# Patient Record
Sex: Female | Born: 1935
Health system: Southern US, Community
[De-identification: ages and names within clinical notes are randomized; demographics above are authoritative.]

## PROBLEM LIST (undated history)

## (undated) DIAGNOSIS — F419 Anxiety disorder, unspecified: Secondary | ICD-10-CM

## (undated) DIAGNOSIS — K219 Gastro-esophageal reflux disease without esophagitis: Secondary | ICD-10-CM

## (undated) DIAGNOSIS — I2129 ST elevation (STEMI) myocardial infarction involving other sites: Secondary | ICD-10-CM

## (undated) DIAGNOSIS — F329 Major depressive disorder, single episode, unspecified: Secondary | ICD-10-CM

## (undated) DIAGNOSIS — M1990 Unspecified osteoarthritis, unspecified site: Secondary | ICD-10-CM

## (undated) DIAGNOSIS — I350 Nonrheumatic aortic (valve) stenosis: Secondary | ICD-10-CM

## (undated) DIAGNOSIS — M8580 Other specified disorders of bone density and structure, unspecified site: Secondary | ICD-10-CM

## (undated) DIAGNOSIS — I2510 Atherosclerotic heart disease of native coronary artery without angina pectoris: Secondary | ICD-10-CM

## (undated) DIAGNOSIS — I10 Essential (primary) hypertension: Secondary | ICD-10-CM

## (undated) DIAGNOSIS — M419 Scoliosis, unspecified: Secondary | ICD-10-CM

## (undated) DIAGNOSIS — E7800 Pure hypercholesterolemia, unspecified: Secondary | ICD-10-CM

## (undated) DIAGNOSIS — F32A Depression, unspecified: Secondary | ICD-10-CM

## (undated) HISTORY — PX: CORONARY ANGIOPLASTY: SHX604

## (undated) HISTORY — DX: Other specified disorders of bone density and structure, unspecified site: M85.80

## (undated) HISTORY — DX: Depression, unspecified: F32.A

## (undated) HISTORY — DX: ST elevation (STEMI) myocardial infarction involving other sites: I21.29

## (undated) HISTORY — DX: Anxiety disorder, unspecified: F41.9

## (undated) HISTORY — PX: ABDOMINAL HYSTERECTOMY: SHX81

## (undated) HISTORY — DX: Gastro-esophageal reflux disease without esophagitis: K21.9

## (undated) HISTORY — DX: Essential (primary) hypertension: I10

## (undated) HISTORY — DX: Atherosclerotic heart disease of native coronary artery without angina pectoris: I25.10

## (undated) HISTORY — PX: APPENDECTOMY: SHX54

## (undated) HISTORY — DX: Major depressive disorder, single episode, unspecified: F32.9

## (undated) HISTORY — DX: Scoliosis, unspecified: M41.9

## (undated) HISTORY — DX: Pure hypercholesterolemia, unspecified: E78.00

## (undated) HISTORY — DX: Nonrheumatic aortic (valve) stenosis: I35.0

## (undated) HISTORY — DX: Unspecified osteoarthritis, unspecified site: M19.90

## (undated) HISTORY — PX: TONSILLECTOMY: SUR1361

---

## 1990-12-25 DIAGNOSIS — I2129 ST elevation (STEMI) myocardial infarction involving other sites: Secondary | ICD-10-CM

## 1990-12-25 HISTORY — DX: ST elevation (STEMI) myocardial infarction involving other sites: I21.29

## 1998-11-26 ENCOUNTER — Other Ambulatory Visit: Admission: RE | Admit: 1998-11-26 | Discharge: 1998-11-26 | Payer: Self-pay | Admitting: Obstetrics and Gynecology

## 1999-03-18 ENCOUNTER — Encounter: Admission: RE | Admit: 1999-03-18 | Discharge: 1999-06-16 | Payer: Self-pay | Admitting: *Deleted

## 1999-12-01 ENCOUNTER — Other Ambulatory Visit: Admission: RE | Admit: 1999-12-01 | Discharge: 1999-12-01 | Payer: Self-pay | Admitting: Obstetrics and Gynecology

## 2000-03-23 ENCOUNTER — Ambulatory Visit (HOSPITAL_COMMUNITY): Admission: RE | Admit: 2000-03-23 | Discharge: 2000-03-23 | Payer: Self-pay | Admitting: *Deleted

## 2001-04-15 ENCOUNTER — Other Ambulatory Visit: Admission: RE | Admit: 2001-04-15 | Discharge: 2001-04-15 | Payer: Self-pay | Admitting: Obstetrics and Gynecology

## 2001-07-17 ENCOUNTER — Encounter: Payer: Self-pay | Admitting: Obstetrics and Gynecology

## 2001-07-23 ENCOUNTER — Inpatient Hospital Stay (HOSPITAL_COMMUNITY): Admission: RE | Admit: 2001-07-23 | Discharge: 2001-07-25 | Payer: Self-pay | Admitting: Obstetrics and Gynecology

## 2001-07-23 ENCOUNTER — Encounter (INDEPENDENT_AMBULATORY_CARE_PROVIDER_SITE_OTHER): Payer: Self-pay

## 2003-10-01 ENCOUNTER — Encounter: Payer: Self-pay | Admitting: Gastroenterology

## 2003-11-06 ENCOUNTER — Encounter: Payer: Self-pay | Admitting: Gastroenterology

## 2004-01-12 ENCOUNTER — Encounter: Payer: Self-pay | Admitting: Gastroenterology

## 2004-12-08 ENCOUNTER — Inpatient Hospital Stay (HOSPITAL_COMMUNITY): Admission: EM | Admit: 2004-12-08 | Discharge: 2004-12-09 | Payer: Self-pay | Admitting: Emergency Medicine

## 2005-03-20 ENCOUNTER — Ambulatory Visit (HOSPITAL_COMMUNITY): Admission: RE | Admit: 2005-03-20 | Discharge: 2005-03-20 | Payer: Self-pay | Admitting: Thoracic Surgery

## 2005-04-13 ENCOUNTER — Encounter (INDEPENDENT_AMBULATORY_CARE_PROVIDER_SITE_OTHER): Payer: Self-pay | Admitting: *Deleted

## 2005-04-13 ENCOUNTER — Inpatient Hospital Stay (HOSPITAL_COMMUNITY): Admission: RE | Admit: 2005-04-13 | Discharge: 2005-04-18 | Payer: Self-pay | Admitting: Thoracic Surgery

## 2005-04-26 ENCOUNTER — Encounter: Admission: RE | Admit: 2005-04-26 | Discharge: 2005-04-26 | Payer: Self-pay | Admitting: Thoracic Surgery

## 2005-05-17 ENCOUNTER — Encounter: Admission: RE | Admit: 2005-05-17 | Discharge: 2005-05-17 | Payer: Self-pay | Admitting: Thoracic Surgery

## 2005-07-25 ENCOUNTER — Encounter: Admission: RE | Admit: 2005-07-25 | Discharge: 2005-07-25 | Payer: Self-pay | Admitting: Thoracic Surgery

## 2006-09-13 ENCOUNTER — Other Ambulatory Visit: Admission: RE | Admit: 2006-09-13 | Discharge: 2006-09-13 | Payer: Self-pay | Admitting: Obstetrics & Gynecology

## 2008-11-11 ENCOUNTER — Ambulatory Visit (HOSPITAL_COMMUNITY): Admission: RE | Admit: 2008-11-11 | Discharge: 2008-11-11 | Payer: Self-pay | Admitting: Internal Medicine

## 2009-06-02 LAB — LIPID PANEL
Cholesterol: 183 mg/dL (ref 0–200)
LDL Cholesterol: 105 mg/dL
Triglycerides: 103 mg/dL (ref 40–160)

## 2009-06-09 ENCOUNTER — Ambulatory Visit: Payer: Self-pay | Admitting: Gastroenterology

## 2009-06-09 DIAGNOSIS — Z860100 Personal history of colon polyps, unspecified: Secondary | ICD-10-CM | POA: Insufficient documentation

## 2009-06-09 DIAGNOSIS — K219 Gastro-esophageal reflux disease without esophagitis: Secondary | ICD-10-CM

## 2009-06-10 ENCOUNTER — Encounter: Payer: Self-pay | Admitting: Gastroenterology

## 2009-07-12 ENCOUNTER — Telehealth: Payer: Self-pay | Admitting: Gastroenterology

## 2009-07-19 ENCOUNTER — Ambulatory Visit: Payer: Self-pay | Admitting: Gastroenterology

## 2009-07-19 ENCOUNTER — Encounter: Payer: Self-pay | Admitting: Gastroenterology

## 2009-07-21 ENCOUNTER — Encounter: Payer: Self-pay | Admitting: Gastroenterology

## 2010-06-07 ENCOUNTER — Ambulatory Visit (HOSPITAL_COMMUNITY): Admission: RE | Admit: 2010-06-07 | Discharge: 2010-06-07 | Payer: Self-pay | Admitting: Internal Medicine

## 2010-09-06 ENCOUNTER — Ambulatory Visit: Payer: Self-pay | Admitting: Cardiology

## 2010-09-07 ENCOUNTER — Ambulatory Visit: Payer: Self-pay | Admitting: Cardiology

## 2010-09-07 ENCOUNTER — Ambulatory Visit (HOSPITAL_COMMUNITY): Admission: RE | Admit: 2010-09-07 | Discharge: 2010-09-07 | Payer: Self-pay | Admitting: Cardiology

## 2011-01-15 ENCOUNTER — Encounter: Payer: Self-pay | Admitting: Thoracic Surgery

## 2011-02-01 ENCOUNTER — Encounter: Payer: Self-pay | Admitting: Cardiology

## 2011-02-01 DIAGNOSIS — I2510 Atherosclerotic heart disease of native coronary artery without angina pectoris: Secondary | ICD-10-CM | POA: Insufficient documentation

## 2011-02-01 DIAGNOSIS — E7800 Pure hypercholesterolemia, unspecified: Secondary | ICD-10-CM | POA: Insufficient documentation

## 2011-02-01 DIAGNOSIS — I2129 ST elevation (STEMI) myocardial infarction involving other sites: Secondary | ICD-10-CM | POA: Insufficient documentation

## 2011-02-01 DIAGNOSIS — I10 Essential (primary) hypertension: Secondary | ICD-10-CM | POA: Insufficient documentation

## 2011-02-01 DIAGNOSIS — F329 Major depressive disorder, single episode, unspecified: Secondary | ICD-10-CM | POA: Insufficient documentation

## 2011-02-01 DIAGNOSIS — M419 Scoliosis, unspecified: Secondary | ICD-10-CM | POA: Insufficient documentation

## 2011-03-22 ENCOUNTER — Ambulatory Visit (INDEPENDENT_AMBULATORY_CARE_PROVIDER_SITE_OTHER): Payer: Medicare Other | Admitting: Cardiology

## 2011-03-22 ENCOUNTER — Encounter: Payer: Self-pay | Admitting: Cardiology

## 2011-03-22 VITALS — BP 142/82 | HR 54 | Ht 63.0 in | Wt 135.0 lb

## 2011-03-22 DIAGNOSIS — I10 Essential (primary) hypertension: Secondary | ICD-10-CM

## 2011-03-22 DIAGNOSIS — I2510 Atherosclerotic heart disease of native coronary artery without angina pectoris: Secondary | ICD-10-CM

## 2011-03-22 DIAGNOSIS — I350 Nonrheumatic aortic (valve) stenosis: Secondary | ICD-10-CM | POA: Insufficient documentation

## 2011-03-22 DIAGNOSIS — I359 Nonrheumatic aortic valve disorder, unspecified: Secondary | ICD-10-CM

## 2011-03-22 DIAGNOSIS — E7800 Pure hypercholesterolemia, unspecified: Secondary | ICD-10-CM

## 2011-03-22 MED ORDER — NIFEDIPINE ER OSMOTIC RELEASE 60 MG PO TB24: 60.0000 mg | ORAL_TABLET | Freq: Every day | ORAL | Status: DC

## 2011-03-22 NOTE — Progress Notes (Signed)
HPI Mrs. Spradlin is seen today for followup. She said she done very well from a cardiac standpoint. She denies any chest pain, shortness of breath, or dizziness. She's had no palpitations. She does have a light cough. She's also experiencing some leg cramps at night. Allergies  Allergen Reactions  . Tetanus Toxoids     Current Outpatient Prescriptions on File Prior to Visit  Medication Sig Dispense Refill  . CALCIUM PO Take by mouth daily.        . Cholecalciferol (VITAMIN D PO) Take by mouth daily.        . clopidogrel (PLAVIX) 75 MG tablet Take 75 mg by mouth daily.        . Cyanocobalamin (VITAMIN B-12 PO) Take by mouth every other day.       . ezetimibe-simvastatin (VYTORIN) 10-20 MG per tablet Take 1 tablet by mouth at bedtime.        . fish oil-omega-3 fatty acids 1000 MG capsule Take 1 g by mouth every other day.       [name] Kitchen FOLIC ACID PO Take by mouth every other day.       . levocetirizine (XYZAL) 5 MG tablet Take 5 mg by mouth as needed.       . metoprolol (LOPRESSOR) 50 MG tablet Take 1 tablet (50 mg total) by mouth daily.  60 tablet    . NIFEdipine (PROCARDIA XL/ADALAT-CC) 60 MG 24 hr tablet Take 1 tablet (60 mg total) by mouth daily.  90 tablet  3  . Nutritional Supplements (MELATONIN PO) Take by mouth daily as needed.        [name] Kitchen POTASSIUM CHLORIDE PO Take by mouth every other day.        . ranitidine (ZANTAC) 300 MG capsule Take 300 mg by mouth daily.        [name] Kitchen VITAMIN E PO Take by mouth daily.        . B Complex Vitamins (VITAMIN-B COMPLEX PO) Take by mouth every other day.       . Multiple Vitamin (MULTIVITAMIN) tablet Take 1 tablet by mouth daily.        [name] Kitchen PARoxetine (PAXIL) 10 MG tablet Take 10 mg by mouth daily.        [name] Kitchen DISCONTD: POTASSIUM PO Take by mouth daily. OTC         Past Medical History  Diagnosis Date  . CAD (coronary artery disease)   . HTN (hypertension)   . Hypercholesteremia   . GERD (gastroesophageal reflux disease)   . Anxiety and depression   . Aortic  stenosis, mild   . Lateral myocardial infarction 1992  . Scoliosis   . Osteoarthritis   . Diverticula of colon   . Osteopenia     Past Surgical History  Procedure Date  . Coronary angioplasty   . Appendectomy   . Tonsillectomy   . Abdominal hysterectomy     Family History  Problem Relation Age of Onset  . Hypertension Father   . Heart failure Mother   . Hypertension Mother   . Cancer Brother     History   Social History  . Marital Status: Divorced    Spouse Name: N/A    Number of Children: 1  . Years of Education: N/A   Occupational History  . retired    Social History Main Topics  . Smoking status: Former Smoker    Types: Cigarettes    Quit date: 12/26/1991  . Smokeless tobacco: Not on file  .  Alcohol Use: No  . Drug Use: No  . Sexually Active: Not on file   Other Topics Concern  . Not on file   Social History Narrative  . No narrative on file    ROS She does complain of "weird" muscle and joint pain. She particularly has pain in her right hip which significantly limits her activity. She's had no edema or orthopnea. All other systems are reviewed and are negative.  PHYSICAL EXAM BP 142/82  Pulse 54  Ht 5\' 3"  (1.6 m)  Wt 135 lb (61.236 kg)  BMI 23.91 kg/m2 She is an elderly female in no acute distress. She is normocephalic atraumatic. Pupils are equal round and reactive to light and accommodation. Sclera are clear. Oropharynx is clear. Neck is supple no JVD, adenopathy, thyromegaly, or bruits. Lungs are clear. Cardiac exam reveals a harsh grade 2/6 systolic ejection murmur right upper border. PMI is normal. There is no S3. Abdomen is soft and nontender he has no masses or bruits. Extremities are without edema. Pedal pulses are 2+. ASSESSMENT AND PLAN

## 2011-03-22 NOTE — Assessment & Plan Note (Signed)
No current angina. Continue Rx with ASA, Plavix, nifedipine, and metoprolol. Continue risk factor modification.

## 2011-03-22 NOTE — Patient Instructions (Signed)
Continue current medical therapy.  Continue low fat diet and restrict salt intake.  We'll schedule a follow up visit in 6 months.

## 2011-03-22 NOTE — Assessment & Plan Note (Signed)
Continue current antihypertensive therapy and sodium restriction.

## 2011-03-22 NOTE — Assessment & Plan Note (Signed)
Echocardiogram in September demonstrated mild stenosis with an aortic valve area 1.56 cm and a mean gradient of 10 mm of mercury. I think this is unlikely ever to cause her any problems. We did discuss this diagnosis with her today.

## 2011-03-22 NOTE — Assessment & Plan Note (Signed)
Continue Vytorin. Followup blood work per Dr. Clelia Croft.

## 2011-03-23 ENCOUNTER — Other Ambulatory Visit: Payer: Self-pay | Admitting: *Deleted

## 2011-03-23 DIAGNOSIS — I10 Essential (primary) hypertension: Secondary | ICD-10-CM

## 2011-03-23 MED ORDER — NIFEDIPINE ER OSMOTIC RELEASE 60 MG PO TB24: 60.0000 mg | ORAL_TABLET | Freq: Every day | ORAL | Status: DC

## 2011-03-23 NOTE — Telephone Encounter (Signed)
Refill to [facility]

## 2011-04-14 ENCOUNTER — Encounter: Payer: Self-pay | Admitting: Cardiology

## 2011-05-03 ENCOUNTER — Other Ambulatory Visit: Payer: Self-pay | Admitting: Cardiology

## 2011-05-03 NOTE — Telephone Encounter (Signed)
Called requesting samples of Plavix. Left her a message that we do not have any samples. Will be going generic soon so not getting many samples.

## 2011-05-03 NOTE — Telephone Encounter (Signed)
Pt called she said that she needs samples of plavix She said she will be in [facility] tomorrow and will pick up then

## 2011-05-12 NOTE — H&P (Signed)
[name], [name]:  [id number]   MEDICAL RECORD NO.:  [id number]          [name] TYPE:  EMS   LOCATION:  MAJO                         FACILITY:  [facility]   PHYSICIAN:  Ulyses Amor, MD DATE OF BIRTH:  [birth year]   DATE OF ADMISSION:  12/08/2004  DATE OF DISCHARGE:                                HISTORY & PHYSICAL   HISTORY OF PRESENT ILLNESS:  [name] is a 77 year old white woman who is  admitted to [facility] for further evaluation of chest pain.   The [name] has a history of coronary artery disease, which dates back to  75.  At that time, she suffered a myocardial infarction, and subsequently  underwent PTCA.  The records from these events are not yet available.  Her  course has been uncomplicated since then.  She presented to the emergency  department during the night after experiencing chest pain which began at  approximately 2 p.m. yesterday afternoon.  She was at rest at the time.  The  discomfort was described as a pressure in the lower substernal and  epigastric region.  It radiated through to her back.  It was associated with  dyspnea and nausea, but no diaphoresis.  In addition, she experienced  several episodes of vomiting.  The discomfort lasted throughout the  afternoon, evening, and into the night, and ultimately prompted her visit to  the emergency department.  Her discomfort has continued, though has subsided  somewhat since her presentation to the emergency department.  There were no  exacerbating or ameliorating factors.  The pain appears to be unrelated to  position, activity, meals, or respirations.  She believes it is different in  quality from the pain which heralded her acute myocardial infarction.   There is no history of congestive heart failure or arrhythmia.   RISK FACTORS:  The [name] has a number of risk factors for coronary artery  disease including hypertension and dyslipidemia.  There is no history  of  smoking, diabetes mellitus, or family history of early coronary artery  disease.   The [name]'s only other medical problem is that of scoliosis.   SOCIAL HISTORY:  The [name] lives alone.  She does not work.  She neither  smokes nor drinks.   ALLERGIES:  None.   MEDICATIONS:  1.  Procardia.  2.  Pepcid.  3.  Aspirin.  4.  Fosamax.  5.  Vytorin.   PREVIOUS OPERATIONS:  1.  Appendectomy.  2.  Hysterectomy.   REVIEW OF SYSTEMS:  No new problems related to her head, eyes, ears, nose,  mouth, throat, lungs, gastrointestinal system, genitalia, neurologic system,  or extremities.  There is no history of neurologic or psychiatric disorder.  There is no history of fever, chills, or weight loss.   PHYSICAL EXAMINATION:  VITAL SIGNS:  Blood pressure 129/64, pulse 84 and  regular, respirations 20, temperature 96.9.  GENERAL:  The [name] was an elderly white female in no discomfort.  She was  alert, oriented, and appropriate.  HEENT:  Head, eyes, nose, and mouth were normal.  NECK:  Without thyromegaly or adenopathy.  Carotid pulses were palpable  bilaterally and without bruits.  CARDIAC:  Normal S1 and S2.  There was no S3, S4, murmur, rub, or click.  Cardiac rhythm was regular.  CHEST:  Palpation of the lower substernal and upper epigastric region  elicited tenderness.  LUNGS:  Clear.  ABDOMEN:  Without mass, hepatosplenomegaly, distention, rebound, guarding,  or rigidity.  Bowel sounds were normal.  BREAST/PELVIC/RECTAL:  Not performed.  EXTREMITIES:  Without edema, deviation, or deformity.  Radial and dorsalis  pedis pulses were palpable bilaterally.  Brief screening neurologic survey  was unremarkable.   The electrocardiogram was normal.  The chest radiograph had not yet been  performed.  Potassium was 3.6, BUN 7, and creatinine 0.7.  Myoglobin was  80.1.  CK-MB less than 1.0, and troponin less than 0.05.  The remaining  studies were pending at the time of this  dictation.   IMPRESSION:  1.  Chest pain.  Rule out unstable angina, rule out aortic aneurysm.  2.  Coronary artery disease.  Status post myocardial infarction and      subsequent percutaneous transluminal coronary angioplasty in 1993.  3.  Hypertension.  4.  Dyslipidemia.  5.  Scoliosis.   PLAN:  1.  Telemetry.  2.  Serial cardiac enzymes.  3.  Aspirin.  4.  Intravenous heparin.  5.  Intravenous nitroglycerin.  6.  Metoprolol.  7.  Non-contrast chest and abdominal CT scans.  8.  Obtain prior hospital records.  9.  Further measures per Dr. Swaziland.   The [name]'s sister was present throughout the [name] encounter.      Mitc   MSC/MEDQ  D:  12/08/2004  T:  12/08/2004  Job:  [id number]   cc:   Peter M. Swaziland, M.D.  [address]  [place], [place] [address]  Fax: [phone]

## 2011-05-12 NOTE — H&P (Signed)
[name], [name]                 ACCOUNT NO.:  [id number]   MEDICAL RECORD NO.:  [id number]          PATIENT TYPE:  INP   LOCATION:  NA                           FACILITY:  [facility]   PHYSICIAN:  Ines Bloomer, M.D. DATE OF BIRTH:  26-Apr-1929   DATE OF ADMISSION:  04/13/2005  DATE OF DISCHARGE:                                HISTORY & PHYSICAL   HISTORY OF PRESENT ILLNESS:  This 77 year old patient was in the hospital in  the month of December with a myocardial infarction and underwent stent  placement.  She was placed on Plavix.  She had a previous angioplasty done  in 1993.  Dr. Peter Swaziland was her cardiologist.  At that time, she had a CT  scan done which showed a 2.3 x 3.7 mass in the mediastinum that was thought  to be a possible substernal thyroid, but thyroid uptake scan was negative.  The mass was followed with a followup CT scan and did not change but had an  increase in size.  PET scan was done which showed minimal uptake in the 3.7  cm anterior mediastinal mass and was thought to be a benign thymoma.  Because of the persistence of the mass, she is scheduled for either cervical  or transdermal thyroidectomy.   ALLERGIES:  TETANUS SHOT.   MEDICATIONS:  1.  Plavix 75 mg a day.  2.  Panadine 20 mg twice a day.  3.  Enteric-coated aspirin 81 mg a day.  4.  Vitorin 10/20 one a day.  5.  Nifedical  1 twice a day.  6.  Fosamax 70 mg every 2 weeks.  7.  Vitorin 300 mcg a week.  8.  Quinine for leg cramps.   PAST MEDICAL HISTORY:  Also significant for:  1.  Hypertension.  2.  Hypercholesterolemia.  3.  Scoliosis.  4.  Arthritis.   PAST SURGICAL HISTORY:  1.  Hysterectomy.  2.  Appendectomy.  3.  Compression fractures of perivertebral area in 1991.  She also has been      treated for osteoporosis and osteopenia.   FAMILY HISTORY:  Positive for cardiac disease.   SOCIAL HISTORY:  She is divorced and has one child.  She does not smoke,  quit smoking in 1993. Does  not drink alcohol on a regular basis.   REVIEW OF SYSTEMS:  She is 228 pounds, 5 feet 4 inches.  CARDIAC:  As in  History of Present Illness.  She has no ongoing angina.  She did have a  cardiac clearance by Dr. Swaziland.  PULMONARY:  She has some shortness of  breath with exertion, no hemoptysis.  GI: She has a mild hernia.  No nausea,  vomiting, constipation, diarrhea.  GU:  She has no dysuria or frequent  urinations.  VASCULAR:  She had some cramping in the legs, and she has  numbness in her left foot from previous traumatic injury.  ORTHOPEDIC:  She  has hip, ankle, and knee pain as well as arthritis in her hands.  NEUROLOGIC:  No history of frequent headaches or seizures.  PSYCHIATRIC:  No  history of depression.  EYES AND ENT:  Normal.   PHYSICAL EXAMINATION:  GENERAL:  Frail, Caucasian female in no acute  distress.  VITAL SIGNS:  Blood pressure 150/80, pulse 82, respirations 18, O2  saturation 97%.  HEENT:  Head is atraumatic.  Pupils equal, round, and reactive to light and  accommodation. Ears:  Tympanic membranes are intact.  Nares:  No septal  deviation.  Throat without lesions.  NECK:  Supple.  There is no thyromegaly.  CHEST:  Clear to auscultation and percussion.  HEART:  Regular sinus rhythm.  ABDOMEN:  Soft.  There is no hepatosplenomegaly.  EXTREMITIES:  Pulses 2+.  There is no clubbing or edema.  NEUROLOGIC:  She is oriented x 3.  Sensory and motor are intact.  Deep  tendon reflexes intact.  SKIN:  Without lesion.   IMPRESSION:  1.  Mediastinal mass, probable thymoma versus lymphoma.  2.  Hypertension.  3.  Coronary artery disease.  4.  Hypercholesterolemia.  5.  Scoliosis.  6.  Osteoporosis.   PLAN:  Resection of thymoma.      DPB/MEDQ  D:  04/11/2005  T:  04/11/2005  Job:  [id number]

## 2011-05-12 NOTE — Discharge Summary (Signed)
[facility]  Patient:    [name], [name]                  MRN: [id number] Adm. Date:  [id number] Disc. Date: [id number] Attending:  Jenean Lindau CC:         Thomas C. Wall, M.D. [facility]   Discharge Summary  PRINCIPAL DISCHARGE DIAGNOSIS:  Symptomatic pelvic relaxation with uterine descensus, cystocele, and rectocele.  PROCEDURE:  Transvaginal hysterectomy with left salpingo-oophorectomy, anterior and posterior colporrhaphy.  TRANSFUSIONS:  None.  COMPLICATIONS:  None.  HOSPITAL CONSULTATIONS:  None.  HISTORY OF PRESENT ILLNESS:  [name] is a 77 year old menopausal female with symptomatic pelvic relaxation. She was counseled as to the alternatives of management including no management versus pessary placement versus definitive surgical management and chose the latter. She was cleared for surgery from the cardiology standpoint by her cardiologist, Dr. Daleen Squibb. Please see dictated history and physical for full details of the history of present illness, past medical history, family history, social history, examination and laboratory studies on admission.  HOSPITAL COURSE:  The patient was admitted for same day surgery and underwent the above-noted procedure under general endotracheal anesthesia without complications. Estimated blood loss was 200 cc. As she had a high cystocele repair without much work around the urethrovesical junction, a suprapubic catheter was not placed. She did have a transurethral Foley that was placed and removed along with the vaginal packing on the morning of postoperative day #1.  Postoperatively, she did well. She remained afebrile and had excellent urinary output. Her postoperative hemoglobin on day #1 was 11.5, down from her preoperative level of 13.0. Sodium was slightly low at 132 and potassium of 3.2.  On postoperative day #1 she advanced her diet to full liquids and began voiding without difficulty. She had  good pain relief with oral medications. She began ambulating. She had replacement of potassium and a recheck on postoperative day #2 revealed a potassium of 4.2 with a sodium of 139. On postoperative day #2 she was tolerating a general diet, ambulating independently, and voiding without difficulty. She had a minimal amount of vaginal drainage. Pathology was reviewed and discussed with the patient which revealed uterine serosal endometriosis with adenomyosis, leiomyoma uteri, no pathologic diagnosis of the left tube and ovary, and benign proliferative endometrium.  The patient was discharged home on postoperative day #2 in improved condition. She was given routine verbal and written discharge instructions.  DISCHARGE FOLLOWUP:  The patient was told to follow up in the office in two to three weeks time. She is to call the office prior to scheduled followup for excessive pain, fever, bleeding, bladder or bowel problems, symptoms of a DVT or PE, or any other concerns. She is to contact Dr. Daleen Squibb for any cardiac related symptoms.  DISCHARGE MEDICATIONS:  She is to continue all her routine medications. She is to take Advil or Aleve as needed for discomfort and was given a prescription for Percocet, dispense 20, one to two q.4-6h. p.r.n. pain with no refill. DD:  08/05/01 TD:  08/05/01 Job: [id number]

## 2011-05-12 NOTE — H&P (Signed)
[facility]  Patient:    [name], [name]                  MRN: [id number] Adm. Date:  [id number] Attending:  Jenean Lindau                         History and Physical  IDENTIFYING DATA:  [name] is a 77 year old female with pelvic relaxation, admitted for surgical repair.  HISTORY OF PRESENT ILLNESS:  [name] is a 77 year old para 1, divorced, menopausal female who has been followed by this physician since 1996.  She has had progressively worsening pelvic relaxation with uterine descensus, cystocele, and rectocele, which have become increasingly symptomatic. Specifically, she reports difficulty with constipation, and complains of a constant perineal bulge which is worse when she is standing or has an active day.  She was offered a pessary versus surgical management and elected to proceed to definitive surgical management.  She has been extensively counseled as to the risks, benefits, alternatives, and complications including recovery expectations and limitations, and agrees to proceed.  She has seen the informed consent film, has undergone a mechanical bowel prep at home, and presents now for admission for same-day surgery.  PAST SURGICAL HISTORY: 1. Tonsillectomy in the distant past. 2. Appendectomy in the distant past. 3. In 1992, angioplasty.  PAST MEDICAL HISTORY: 1. History of crushed vertebra in her back with known severe scoliosis. 2. Cardiovascular disease, status post myocardial infarction in the distant    past, currently with stable cardiac function with a normal nonischemic    Cardiolite study in May 2001. 3. Hypertension, well controlled. 4. Osteopenia.  OBSTETRICAL HISTORY:  Vaginal delivery x 1.  GYNECOLOGICAL HISTORY:  As noted above.  Normal Pap smear in April 2002. Normal mammogram in February 2002.  No postmenopausal bleeding.  She was on hormonal replacement therapy for many years, stopping this in November  1998. She took Evista from December 1998 through November 2001, and is currently on no form of hormonal therapy.  ALLERGIES:  No known drug or latex sensitivities.  She has an allergic reaction to TETANUS.  CURRENT MEDICATIONS: 1. Accupril 20 mg q.d. 2. Famotidine 20 mg 2 q.d. with other medications. 3. Zocor 10 mg 3 p.o. at h.s. postprandially. 4. Norvasc 10 mg 1/2 in the morning, 1/2 in the evening. 5. Aspirin and/or Celebrex and/or Vioxx, all discontinued prior to surgery. 6. Calcium supplementation 1200 mg daily. 7. Fosamax 70 mg once weekly. 8. Stool softener 2 q.d. 9. Premarin vaginal cream 2 g three times weekly.  TRANSFUSION HISTORY:  Negative.  SOCIAL HISTORY:  The patient was divorced after 45 years of marriage and moved from [place] in [place].  She has a sister who lives locally.  She has one grown son.  She denies smoking.  She drinks wine occasionally.  She denies any illicit drug use.  FAMILY HISTORY:  Positive for breast cancer in paternal grandmother; otherwise noncontributory.  REVIEW OF SYSTEMS:  Notable for the history of present illness.  She denies any stress urinary incontinence or symptoms of an overactive bladder.  She has no neurologic or respiratory symptoms.  Her cardiac status is stable, and she has been cleared for surgery by Dr. Daleen Squibb and associates.  She currently has no chest pain, shortness of breath, or exertional symptoms.  PHYSICAL EXAMINATION:  GENERAL:  Elderly white female in no apparent distress.  VITAL SIGNS:  Blood pressure 136/80.  HEENT:  Negative.  Oropharynx clear.  NECK:  Supple.  Without thyromegaly or lymphadenopathy.  LUNGS:  Clear to auscultation.  HEART:  Regular rate and rhythm.  Without murmurs, gallops, or rubs.  Carotids +2 and equal, without bruit.  Distal pulses full.  BREASTS:  Without masses.  ABDOMEN:  Soft and nontender.  Without hepatosplenomegaly, mass, or tenderness.  Well-healed appendectomy  scar.  PELVIC:  Notable for well-estrogenized tissue.  Fair perineal support, with a second- to third-degree cystocele, second-degree rectocele, cervical uterine descensus to just inside the introitus with traction, a normal-sized uterus, no adnexal masses.  RECTOVAGINAL:  Confirmatory.  NEUROLOGIC:  Grossly nonfocal.  EXTREMITIES:  Without edema, clubbing, or cyanosis.  LABORATORY DATA:  On admission revealed hemoglobin of 13.0, hematocrit of 38.9, white count of 4.0, platelets of 254.  CMET profile within normal limits.  Coagulation studies normal.  Urinalysis clear.  Chest x-ray per Dr. Daleen Squibb.  EKG per Dr. Daleen Squibb.  ADMISSION IMPRESSION: 1. Pelvic relaxation, symptomatic, desires surgical repair. 2. Osteopenia. 3. Coronary artery disease, status post myocardial infarction and angioplasty    in 1992, currently stable. 4. Hypertension.  PLAN:  The patient is to undergo vaginal hysterectomy with repair, as noted above.  Ovaries will be removed if technically possible at the time of surgery.  She has been cleared for surgery by Dr. Daleen Squibb, and his note is present on the patients chart with details regarding her cardiac function. He will be notified of the patients admission.  She has undergone a mechanical bowel prep and will be adequately rehydrated intraoperatively and postoperatively.  She has been extensively counseled and has given full consent. DD:  07/23/01 TD:  07/23/01 Job: [id number]

## 2011-05-12 NOTE — Op Note (Signed)
NAMEMAGENTA, SCHMIESING                 ACCOUNT NO.:  [id number]   MEDICAL RECORD NO.:  [id number]          PATIENT TYPE:  INP   LOCATION:  2899                         FACILITY:  [facility]   PHYSICIAN:  Ines Bloomer, M.D. DATE OF BIRTH:  09/06/1929   DATE OF PROCEDURE:  DATE OF DISCHARGE:                                 OPERATIVE REPORT   PREOPERATIVE DIAGNOSIS:  Thymic mass.   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   OPERATION PERFORMED:  Partial median sternotomy, thymectomy.   SURGEON:  Ines Bloomer, M.D.   FIRST ASSISTANT:  Rowe Clack, P.A.-C.   PROCEDURE:  After general anesthesia, the patient was prepped and draped in  the usual sterile manner.  An incision was made from the sternal notch  dissecting down.  The clavicular sternal ligament was divided with  electrocautery, and the electrocautery was used to go down to the sternum,  and then the sternum was divided down to the manubrium with reciprocating  saw.  Topical thrombin was applied to the marrow. All bleeding was  electrocoagulated.  A laminar spreader was placed in the wound, and then  dissection was started superiorly dissecting the superior portion of the  thymus off the insertion of the thyroid gland and then dissecting out the  right and the left horns.  The mass was cystic in structure.  It was about 5-  8 cm in size, and we first dissected down the left side dissecting the mass  up off the sternum and then off the innominate vein and doubly ligating  several branches of the innominate vein and then dissecting down to the  pericardium and dissecting it off the pericardium.  After the mass had been  completely dissected free on the left side, it was then dissected free on  the right side, first dissecting out the upper horn of the thymus and then  dissecting it off the pericardium.  The 5-cm plus mass was removed.  The  entire thymus was removed.  All bleeding was electrocoagulated.  A  Blake  drain was placed in the space and brought out through a separate stab wound  in the neck.  The chest was closed with three #6 wires, #2-0 Vicryl in the  muscle layer, and 3-0 Vicryl in a subcuticular stitch, and Dermabond for the  skin.  The patient was returned to the recovery room in stable condition.      DPB/MEDQ  D:  04/13/2005  T:  04/13/2005  Job:  [id number]

## 2011-05-12 NOTE — Cardiovascular Report (Signed)
[name], [name]                 ACCOUNT NO.:  [id number]   MEDICAL RECORD NO.:  [id number]          PATIENT TYPE:  INP   LOCATION:  6525                         FACILITY:  [facility]   PHYSICIAN:  Peter M. Swaziland, M.D.  DATE OF BIRTH:  1929-04-12   DATE OF PROCEDURE:  12/08/2004  DATE OF DISCHARGE:                              CARDIAC CATHETERIZATION   INDICATION FOR PROCEDURE:  A 76 year old white female presents with a non Q-  wave myocardial infarction.  Patient has a history of coronary disease with  remote angioplasty of a diagonal branch in 1993.   PROCEDURE:  1.  Left heart catheterization.  2.  Coronary and left ventricular angiography.  3.  Intracoronary stenting of the mid right coronary artery.   ACCESS:  Via the right femoral artery using standard Seldinger technique.   EQUIPMENT:  6-French 4 cm right and left Judkins catheter, 6-French pigtail  catheter, 6-French arterial sheath, 6-French FR4 guide, 0.014 high-torque  floppy extra support wire, a 2.5 x 15 mm Maverick balloon, and a 3.0 x 18 mm  Cypher drug-eluting stent.   CONTRAST:  160 mL of Omnipaque.   MEDICATIONS:  Nitroglycerin drip at 13 mcg/minute, heparin 3000 units IV,  Integrilin double bolus 180 mcg/kg followed by continuous infusion at 2  mcg/kg/minute, nitroglycerin 200 mcg intracoronary x2, Plavix 300 mg p.o.,  Pepcid 20 mg p.o.   HEMODYNAMIC DATA:  1.  Aortic pressure was 124/61 with a mean of 87 mmHg.  2.  Left ventricular pressure was 120 with an EDP of 15 mmHg.   ANGIOGRAPHIC DATA:  1.  The left coronary artery arises and distributes normally.  The left main      coronary artery is normal.  2.  Left anterior descending is heavily calcified proximally.  At the      takeoff of the first septal perforator there is a moderate stenosis      estimated at 50-70%.  This lesion appeared smooth.  Remainder of the LAD      was without significant disease, but was very tortuous.  There is a      large  diagonal branch that appeared widely patent.  3.  The left circumflex coronary artery gives rise to two marginal branches.      The first marginal branch has 20-30% narrowing at the ostium, otherwise      no significant disease.  4.  The right coronary artery rises and distributes normally in a dominant      fashion.  It is heavily calcified throughout the proximal and mid      vessel.  In the mid vessel there is an eccentric stenosis of 80-90%.      There is increased haziness in this lesion with hypodensity suggesting      ulcerated plaque.  The remainder of the vessel has scattered wall      irregularities less than 20%.   LEFT VENTRICULOGRAPHY:  Left ventricular angiography performed in the RAO  view.  This demonstrates normal left ventricular size and contractility with  normal systolic function.  Ejection fraction is estimated  at 65%.  There is  no mitral regurgitation.   We proceeded at this point with intervention of the mid right coronary  lesion which appeared to be the culprit lesion.  We initially obtained guide  shots and patient was given intracoronary nitroglycerin.  We then placed a  high-torque floppy wire across the lesion with some difficulty.  In attempt  to primarily stent the lesion we were unable to advance a stent across the  lesion.  We then withdrew the stent and pre dilated the lesion with a 2.5 x  15 mm Maverick balloon dilating x3 up to 10 atmospheres.  Again, when we  tried to recross with the stent we were again unable to advance the stent  despite having excellent catheter support.  We then switched to an extra  support wire.  We left the original floppy wire down as well and used this  as a buddy wire.  With deep engagement of the right coronary artery with our  guide and using the buddy wire system we were able to eventually cross the  lesion with the stent.  It was deployed at 11 atmospheres and then post  dilated to 16 atmospheres with the stent  balloon.  This yielded an excellent  angiographic result with 0% residual stenosis and TIMI grade 3 flow.  During  balloon inflations the patient did have fairly dramatic ST elevation  inferiorly, but had minimal cardiac symptoms.   FINAL INTERPRETATION:  1.  Two vessel atherosclerotic coronary artery disease.  There was a modest      lesion in the left anterior descending at the takeoff of the first      septal perforator.  There was a more severe ulcerated lesion in the mid      right coronary artery.  2.  Normal left ventricular function.  3.  Successful stenting of the mid right coronary artery.       PMJ/MEDQ  D:  12/08/2004  T:  12/08/2004  Job:  [id number]   cc:   Laqueta Linden, M.D.  [address]  [place]  [place] [address]  Fax: [phone]

## 2011-05-12 NOTE — Discharge Summary (Signed)
NAMESUMEYA, YONTZ                 ACCOUNT NO.:  [id number]   MEDICAL RECORD NO.:  [id number]          PATIENT TYPE:  INP   LOCATION:  2030                         FACILITY:  [facility]   PHYSICIAN:  Ines Bloomer, M.D. DATE OF BIRTH:  October 29, 1929   DATE OF ADMISSION:  04/13/2005  DATE OF DISCHARGE:  04/18/2005                                 DISCHARGE SUMMARY   HISTORY OF PRESENT ILLNESS:  The patient is a 77 year old female who was in  the hospital in the month of December 2005 with a myocardial infarction and  at that time underwent stent placement.  She was placed on Plavix as well.  She had a previous angioplasty done in 1993.  During this hospitalization, a  CT scan of her chest was also done, and this revealed a 2.3 x 3.7 cm mass in  the mediastinum that was felt most likely to be a substernal thyroid, but  thyroid uptake scan was negative.  The patient had a follow-up CT scan which  showed some increase in size.  A PET scan was done, and this showed minimal  uptake in the 3.7 cm anterior mediastinal mass that was likely to be a  benign thymoma.  The patient was admitted this hospitalization for surgical  resection.   PAST MEDICAL HISTORY:  1.  Hypertension.  2.  Hypercholesterolemia.  3.  Scoliosis.  4.  Arthritis.   PAST SURGICAL HISTORY:  1.  Hysterectomy.  2.  Appendectomy.  3.  Compression fracture, and she has also been treated for osteoporosis and      osteopenia.   ALLERGIES:  TETANUS SHOT.   CURRENT MEDICATIONS:  1.  Plavix 75 mg daily.  2.  Panadeine 20 mg b.i.d.  3.  Enteric coated aspirin 81 mg daily.  4.  Vytorin 10/20 one daily.  5.  Nifediac one b.i.d.  6.  Fosamax 70 mg every two weeks.  7.  Quinine p.r.n. for leg cramps.   FAMILY HISTORY/SOCIAL HISTORY/REVIEW OF SYMPTOMS/PHYSICAL EXAMINATION:  Please see the history and physical done at the time of admission.   HOSPITAL COURSE:  The patient was admitted electively on April 13, 2005 and  taken to  the operating room, at which time she underwent a mini-median  sternotomy with resection of a bronchogenic cyst.  The patient tolerated the  procedure well and was taken to the postanesthesia care unit in stable  condition.  Postoperative hospital course:  The patient has done quite well.  All routine lines, monitors, and drainage devices were discontinued in the  standard fashion.  She has remained hemodynamically stable.  Incisions are  healing well, without signs of infection.  She is tolerating diet and  activity commensurate for level of postoperative convalescence.  The  pathology has not been finalized, but the initial or frozen section was as  stated previously, consistent with a bronchogenic cyst.  Currently, the  patient is felt to be tentatively stable for discharge on the morning of  April 18, 2005 pending morning round reevaluation.   MEDICATIONS ON DISCHARGE:  1.  As preoperatively.  2.  Additionally, for pain Tylox one or two q.6 h. as needed.   INSTRUCTIONS:  The patient received written instructions in regard to  medications, activity, diet, wound care, and followup.   FOLLOWUP:  Dr. Edwyna Shell, Wednesday Apr 26, 2005 at 2:50 p.m., with a chest x-  ray from [facility].   FINAL DIAGNOSIS:  Bronchogenic cyst, now status post surgical resection.   OTHER DIAGNOSES:  As previously listed in the history.      WEG/MEDQ  D:  04/17/2005  T:  04/18/2005  Job:  [id number]   cc:   Ines Bloomer, M.D.  [address]  [place]  [place] [address]   CVTS OFfice   Peter M. Swaziland, M.D.  [address]  [place], [place] [address]  Fax: [phone]

## 2011-05-12 NOTE — Op Note (Signed)
[facility]  Patient:    [name], [name]                  MRN: [id number] Proc. Date: 07/23/01 Adm. Date:  [id number] Attending:  Jenean Lindau CC:         Thomas C. Wall, M.D. [facility]   Operative Report  PREOPERATIVE DIAGNOSES:  Pelvic relaxation with uterine decensus, cystocele, and rectocele.  POSTOPERATIVE DIAGNOSES:  Pelvic relaxation with uterine decensus, cystocele, and rectocele.  PROCEDURE:  Transvaginal hysterectomy with left salpingo-oophorectomy, anterior and posterior colporrhaphy.  SURGEON:  Laqueta Linden, M.D.  ASSISTANT:  Andres Ege, M.D.  ANESTHESIA:  General endotracheal.  ESTIMATED BLOOD LOSS:  200 cc.  URINE OUTPUT:  350 cc.  FLUIDS:  1400 cc of crystalloid.  COUNTS:  Correct x 2.  COMPLICATIONS:  None.  INDICATIONS:  [name] is a 77 year old, para 1, menopausal female, who has a history of progressively worsening pelvic relaxation symptomatic for perineal bulging and difficulty with fecal evacuation.  She denies stress urinary incontinence.  She has done Kegel exercises and used vaginal estrogen cream.  She was offered a pessary versus definitive surgical management and chose the latter.  She has been extensively counseled as to the risks, benefits, alternatives, and complications of the procedure including but not limited to anesthesia risks, infection, bleeding, injury to bowel, bladder, ureters, vessels, nerves.  Possibility of fistula formation, the possibility of recurrent prolapse, the possibility of a laparotomy, recovery expectations, as well as likely cystoscopy with placement of a suprapubic catheter and problems with return of urinary function including urinary retention prolonged, or the development or urinary incontinence postoperatively as well as other unknown risks.  She has seen the informed consent film, has voiced her understanding of the above, and agrees to proceed.  She has  undergone a mechanical bowel prep preoperatively.  She has received Ancef 1 gram IV antibiotic prophylaxis preoperatively.  DESCRIPTION OF PROCEDURE:  The patient was taken to the operating room and after proper identification and consents were ascertained, she was placed on the operating table in the supine position.  After the induction of general endotracheal anesthesia, she was placed in the candy-cane stirrups, taking care not to overextend her hips due to her history of arthritis.  Her vagina and perineum were then prepped and draped in a routine sterile fashion.  A transurethral Foley was placed.  A weighted speculum was placed in the posterior vagina.  The cervix was grasped with a single-tooth tenaculum and advanced to the introitus.  The portio was then injected with 10 cc of 1:100,000 epinephrine.  The cervix was then circumscribed with the scalpel. The posterior vaginal mucosa was then tented down and the cul-de-sac peritoneum entered sharply without difficulty.  The uterosacral ligaments were clamped, cut, Heaney ligated, and tagged with 0 Vicryl suture.  Cardinal ligaments were similarly clamped, cut, suture ligated.  The anterior peritoneal reflection was noted to be quite high upon the fundus.  The fundus was actually flipped posteriorly and the reflection identified with a finger passed around the top of the fundus, and the anterior peritoneal cavity was again entered sharply without obvious injury or entry into the bladder. Proximal adnexal pedicles were taken bilaterally with removal of the specimen. The left tube and ovary promptly fell into the operative site.  The ovary was tiny and atrophic, and the tube was without lesions.  As this was easily accessible, the infundibulopelvic ligament was clamped, and the adnexa were removed  as well.  The infundibulopelvic ligament was then triply ligated with two free ties and a stitch of 0 Vicryl.  This was released in the  peritoneal cavity.  The right tube and ovary appeared normal with a similarly atrophic ovary, although there was adhesion of the tube up along the pelvic sidewall, making the infundibulopelvic ligament inaccessible.  For this reason, the right adnexa was left in place.  At the conclusion of the hysterectomy part of the procedure, a McCall suture was placed through the upper vaginal mucosa, uterosacral ligament with plication of the posterior cul-de-sac peritoneum to prevent enterocele formation.  This suture was tied at the very end of the procedure.  The parietal peritoneum was then closed in a pursestring fashion with exteriorization of the rest of the pedicles other than the adnexal pedicle.  Counts were correct prior to closure of the peritoneum.  At this point, the lower half of the vaginal cuff was closed in interrupted figure-of-eight sutures of 0 Vicryl with excellent hemostasis noted.  The uterosacral tags were tied in the midline.  Attention was then turned to the anterior repair.  The edges of the vaginal mucosa were grasped with Allis clamps.  The mucosa was injected in the midline with injectable saline and was undermined and incised in the midline to the upper margin of the cystocele. This was noted to be a high cystocele and ended approximately 1.5 cm prior to the urethrovesical junction.  As there appeared to be good support of the bladder neck, dissection and suturing was not carried up to the urethrovesical junction.  The vaginal mucosa was sharply and bluntly dissected off of the perivesical tissues.  The cystocele was then reduced using interrupted 0 Vicryl sutures, UR6 needle with excellent reduction of the cystocele.  The redundant vaginal mucosa was trimmed.  The anterior vaginal wall was then closed in a running fashion with closure of the anterior portion of the vaginal cuff as well.  Hemostasis was noted to be excellent.  Attention was then turned posteriorly.  The  patient was noted to have a fairly good perineal body.  The mucosa at the introitus was excised and grasped with Allis  clamps.  Again, the mucosa was then injected with injectable saline, and it was undermined and incised in the midline to within 1 cm of the vaginal cuff. Vaginal mucosa was then sharply and bluntly dissected off of the perirectal tissues.  The fascia was pulled together in the midline with excellent reduction of the rectocele.  The redundant vaginal mucosa was trimmed, and the posterior vagina was closed in a running intermittently locking fashion using 2-0 Vicryl suture.  Again, hemostasis was noted to be excellent.  The McCall suture was then tied.  Rectal examination confirmed smooth mucosa without tucking, dimpling, or suture into the mucosa.  The vaginal cuff was inspected.  There was a small amount of oozing with one additional figure-of-eight suture placed.  Vaginal packing with plain gauze soaked with estrogen cream was then placed.  Peripad was applied.  The transurethral Foley was left in place.  As the urethrovesical junction was not involved in the surgical procedure, it was felt that cystoscopy and placement of suprapubic catheter was not necessary. The Foley catheter will be left in until the pack is discontinued, and then the Foley will be discontinued as well.  The patient was stable and extubated on transfer to the recovery room.  Estimated blood loss was 200 cc.  Urine output 350 cc.  Fluids  1400 cc of crystalloid, counts correct x 2, complications none. DD:  07/23/01 TD:  07/23/01 Job: [id number]

## 2011-05-12 NOTE — Discharge Summary (Signed)
NAMESHANEY, DECKMAN                 ACCOUNT NO.:  [id number]   MEDICAL RECORD NO.:  [id number]          PATIENT TYPE:  INP   LOCATION:  6525                         FACILITY:  [facility]   PHYSICIAN:  Peter M. Swaziland, M.D.  DATE OF BIRTH:  [birth year]   DATE OF ADMISSION:  12/07/2004  DATE OF DISCHARGE:  12/09/2004                                 DISCHARGE SUMMARY   HISTORY OF PRESENT ILLNESS:  Ms. [name] is a 77 year old white female with  history of coronary artery disease.  She has had remote myocardial  infarction 1993 and had angioplasty of a diagonal branch at that time.  She  has done very well since then with no recurrent angina.  She had a negative  stress Cardiolite study in February 2005.  She presented at this time with a  12-hour history of substernal chest pain localized to the lower substernal  area, epigastric region and describes as a pressure.  It radiates to her  back and also into her arms.  She had no associated shortness of breath.  Her past medical history is also significant for hypertension and  dyslipidemia.  She has a history of chronic cough.  She has a history of  scoliosis and arthritis.  For further details of her past medical history,  social history, family history, and physical examination please see  admission history and physical.   LABORATORY DATA:  Initial ECG was normal.  White count was 7400, hemoglobin  12.8, hematocrit 37.9, platelets 220,000.  Sodium 137, potassium 3.6,  chloride 103, CO2 24, BUN 7, creatinine 0.7, glucose 116. Coags were normal.  Liver function studies were normal.  Albumin was 3.0, calcium 8.1, magnesium  2.1.  Serial cardiac enzymes/initial point of care cardiac enzymes were  negative.  Subsequent CPKs were 64 with 4.8 MB and 88 with 6.5 MB and then  82 with 4.8 MB.  Troponins increased to 0.77, 1.31, and 0.81.  Chest x-ray  showed no active disease.   HOSPITAL COURSE:  Patient's pain was relieved in the emergency room with IV  morphine.  She was placed on IV heparin and nitroglycerin.  She was started  on beta blockade.  She did undergo a noncontrast CT of the chest and  abdomen.  This showed no evidence of thoracic aneurysm or other acute  findings.  She had a small hiatal hernia.  There was a soft tissue mass  measuring 3.7 x 2.1 cm in the substernal area anterior to the thoracic  inlet.  This felt to most likely represent a substernal goiter.  Abdominal  films showed no other acute findings.  There were changes noted of lumbar  spine degenerative changes and scoliosis.   Based on her cardiac enzymes it was felt that she had suffered a non Q-wave  myocardial infarction.  She subsequently underwent cardiac catheterization  on December 08, 2004.  This demonstrated a 50-70% stenosis in the LAD at the  takeoff of the first septal perforator.  The left circumflex coronary artery  was without significant disease.  The first diagonal was still widely  patent.  The right coronary was heavily calcified and had an ulcerated 80-  90% stenosis in the mid vessel.  Left ventricular function was normal.  Patient underwent successful stenting of the mid right coronary artery using  a 3.0 x 18 mm Cypher drug-eluting stent.  She was continued on IV Integrilin  for 18 hours.  She was started on oral Plavix.  She did well.  Follow-up ECG  showed no significant ST segment changes.  At the time of discharge thyroid  studies, lipid panel, and hemoglobin A1C were still pending.  Patient was  discharged home in stable condition.   DISCHARGE DIAGNOSES:  1.  Non Q-wave myocardial infarction.  2.  Successful stenting of the mid right coronary artery.  3.  Hypertension.  4.  Hyperlipidemia.  5.  Remote history of angioplasty to diagonal.  6.  Scoliosis.  7.  Substernal soft tissue mass, possible goiter.  Follow-up needed.   DISCHARGE MEDICATIONS:  1.  Patient is instructed to take a 325 mg aspirin daily for two weeks and      then  drop back to 81 mg daily.  2.  Plavix 75 mg daily.  3.  Pepcid 20 mg b.i.d.  4.  Fosamax 70 mg weekly.  5.  Vytorin 10/20 mg daily.  6.  Procardia XL 60 mg daily.   Patient is to avoid heavy lifting or straining for the next five days.  She  will follow a low fat, low salt diet.  She will follow up with Dr. Swaziland in  two weeks.  Recommend a follow-up CT of the chest with contrast in two  months to follow up the substernal mass.       PMJ/MEDQ  D:  12/09/2004  T:  12/09/2004  Job:  [id number]   cc:   Laqueta Linden, M.D.  [address]  [place]  [place] [address]  Fax: [phone]

## 2011-09-27 ENCOUNTER — Ambulatory Visit: Payer: PRIVATE HEALTH INSURANCE | Admitting: Cardiology

## 2011-10-04 ENCOUNTER — Encounter: Payer: Self-pay | Admitting: Cardiology

## 2011-10-04 ENCOUNTER — Ambulatory Visit (INDEPENDENT_AMBULATORY_CARE_PROVIDER_SITE_OTHER): Payer: Medicare Other | Admitting: Cardiology

## 2011-10-04 VITALS — BP 136/83 | HR 55 | Ht 63.0 in | Wt 135.0 lb

## 2011-10-04 DIAGNOSIS — I2510 Atherosclerotic heart disease of native coronary artery without angina pectoris: Secondary | ICD-10-CM

## 2011-10-04 DIAGNOSIS — E7800 Pure hypercholesterolemia, unspecified: Secondary | ICD-10-CM

## 2011-10-04 DIAGNOSIS — I359 Nonrheumatic aortic valve disorder, unspecified: Secondary | ICD-10-CM

## 2011-10-04 DIAGNOSIS — I10 Essential (primary) hypertension: Secondary | ICD-10-CM

## 2011-10-04 DIAGNOSIS — I350 Nonrheumatic aortic (valve) stenosis: Secondary | ICD-10-CM

## 2011-10-04 NOTE — Progress Notes (Signed)
HPI [name] is seen today for followup. She reports she has been doing well from a cardiac standpoint. She denies any chest pain or shortness of breath. She has had a nonproductive cough. Her blood pressure has been quite labile with readings anywhere from 99-170 systolic. She has had some intermittent lightheadedness. She has a history of coronary disease in his head prior angioplasty of the diagonal branch in 1992. She had stenting of the right coronary in 2005.  Allergies  Allergen Reactions  . Tetanus Toxoids     Current Outpatient Prescriptions on File Prior to Visit  Medication Sig Dispense Refill  . atorvastatin (LIPITOR) 40 MG tablet Take 40 mg by mouth daily.        . B Complex Vitamins (VITAMIN-B COMPLEX PO) Take by mouth every other day.       . Cholecalciferol (VITAMIN D PO) Take by mouth daily.        . clopidogrel (PLAVIX) 75 MG tablet Take 75 mg by mouth daily.        . Cyanocobalamin (VITAMIN B-12 PO) Take by mouth every other day.       . fish oil-omega-3 fatty acids 1000 MG capsule Take 1 g by mouth every other day.       [name] Kitchen FOLIC ACID PO Take by mouth every other day.       . levocetirizine (XYZAL) 5 MG tablet Take 5 mg by mouth as needed.       . meloxicam (MOBIC) 7.5 MG tablet Take 2 tablets (15 mg total) by mouth as needed for pain.  60 tablet    . metoprolol (LOPRESSOR) 50 MG tablet Take 1 tablet (50 mg total) by mouth daily.  60 tablet    . Multiple Vitamin (MULTIVITAMIN) tablet Take 1 tablet by mouth daily.        [name] Kitchen NIFEdipine (PROCARDIA XL/ADALAT-CC) 60 MG 24 hr tablet Take 1 tablet (60 mg total) by mouth daily.  90 tablet  3  . Nutritional Supplements (MELATONIN PO) Take by mouth daily as needed.        [name] Kitchen POTASSIUM CHLORIDE PO Take by mouth every other day.        . ranitidine (ZANTAC) 300 MG capsule Take 300 mg by mouth daily.        [name] Kitchen VITAMIN E PO Take by mouth daily.        [name] Kitchen PARoxetine (PAXIL) 10 MG tablet Take 10 mg by mouth daily.          Past Medical  History  Diagnosis Date  . CAD (coronary artery disease)   . HTN (hypertension)   . Hypercholesteremia   . GERD (gastroesophageal reflux disease)   . Anxiety and depression   . Aortic stenosis, mild   . Lateral myocardial infarction 1992  . Scoliosis   . Osteoarthritis   . Diverticula of colon   . Osteopenia     Past Surgical History  Procedure Date  . Coronary angioplasty   . Appendectomy   . Tonsillectomy   . Abdominal hysterectomy     Family History  Problem Relation Age of Onset  . Hypertension Father   . Heart failure Mother   . Hypertension Mother   . Cancer Brother     History   Social History  . Marital Status: Divorced    Spouse Name: N/A    Number of Children: 1  . Years of Education: N/A   Occupational History  . retired    Social History  Main Topics  . Smoking status: Former Smoker    Types: Cigarettes    Quit date: 12/26/1991  . Smokeless tobacco: Not on file  . Alcohol Use: No  . Drug Use: No  . Sexually Active: Not on file   Other Topics Concern  . Not on file   Social History Narrative  . No narrative on file    ROS She does complain of pain in her right flank. She has chronic scoliosis. She particularly has pain in her right hip which significantly limits her activity. She's had no edema or orthopnea. All other systems are reviewed and are negative.  PHYSICAL EXAM BP 136/83  Pulse 55  Ht 5\' 3"  (1.6 m)  Wt 135 lb (61.236 kg)  BMI 23.91 kg/m2 She is an elderly female in no acute distress. She is normocephalic atraumatic. Pupils are equal round and reactive to light and accommodation. Sclera are clear. Oropharynx is clear. Neck is supple no JVD, adenopathy, thyromegaly, or bruits. Lungs are clear. Cardiac exam reveals a harsh grade 2/6 systolic ejection murmur right upper border. PMI is normal. There is no S3. Abdomen is soft and nontender he has no masses or bruits. Extremities are without edema. Pedal pulses are 2+.  Laboratory data:  ECG today demonstrates sinus bradycardia with a rate of 54 beats per minute. She has a right bundle branch block and LVH by voltage.  ASSESSMENT AND PLAN

## 2011-10-04 NOTE — Assessment & Plan Note (Signed)
She has had prior stenting of the right coronary with a drug-eluting stent in 2005. She remains asymptomatic. Her last stress test was 2 years ago. We will continue on her current medical therapy and monitor for any symptoms.

## 2011-10-04 NOTE — Assessment & Plan Note (Signed)
Blood pressure reading today is acceptable. She has had some labile readings. I would be cautious about increasing her medication to avoid hypotension.

## 2011-10-04 NOTE — Patient Instructions (Signed)
Continue your current medications  I will see you again in 6 months.   

## 2012-03-15 ENCOUNTER — Other Ambulatory Visit: Payer: Self-pay | Admitting: Cardiovascular Disease

## 2012-03-19 ENCOUNTER — Encounter: Payer: Self-pay | Admitting: Internal Medicine

## 2012-03-19 ENCOUNTER — Ambulatory Visit (INDEPENDENT_AMBULATORY_CARE_PROVIDER_SITE_OTHER): Payer: Medicare Other | Admitting: Internal Medicine

## 2012-03-19 VITALS — BP 110/78 | Temp 98.1°F | Wt 173.0 lb

## 2012-03-19 DIAGNOSIS — I10 Essential (primary) hypertension: Secondary | ICD-10-CM | POA: Diagnosis not present

## 2012-03-19 DIAGNOSIS — I2510 Atherosclerotic heart disease of native coronary artery without angina pectoris: Secondary | ICD-10-CM

## 2012-03-19 DIAGNOSIS — S1093XA Contusion of unspecified part of neck, initial encounter: Secondary | ICD-10-CM | POA: Diagnosis not present

## 2012-03-20 ENCOUNTER — Encounter: Payer: Self-pay | Admitting: Internal Medicine

## 2012-03-20 NOTE — Patient Instructions (Signed)
Apply ice to the area for the next 24 hours  Call or return to clinic prn if these symptoms worsen or fail to improve as anticipated.

## 2012-03-20 NOTE — Progress Notes (Signed)
  Subjective:    Patient ID: [name], female    DOB: Dec 05, 1929, 78 y.o.   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 She missed a step and fell forward sustaining trauma to her right facial area. There is no loss of consciousness. She did finish her meal to restaurant before coming to the office for evaluation. She has been applying ice to the right infraorbital area.    Review of Systems  Musculoskeletal:       Right facial pain and swelling       Objective:   Physical Exam  Constitutional: She is oriented to person, place, and time. She appears well-developed and well-nourished. No distress.  Eyes: Conjunctivae are normal. Pupils are equal, round, and reactive to light.  Neurological: She is alert and oriented to person, place, and time. She has normal reflexes. No cranial nerve deficit.  Skin:       Soft tissue swelling and early bruising involving the right malar region.          Assessment & Plan:   Right facial contusion. The patient will will continue to apply ice over the next 24 hours. She will take Tylenol for discomfort. She'll call to his any clinical change Hypertension stable

## 2012-03-27 ENCOUNTER — Telehealth: Payer: Self-pay | Admitting: Cardiology

## 2012-03-27 ENCOUNTER — Ambulatory Visit (INDEPENDENT_AMBULATORY_CARE_PROVIDER_SITE_OTHER): Payer: Medicare Other | Admitting: Cardiology

## 2012-03-27 ENCOUNTER — Encounter: Payer: Self-pay | Admitting: Cardiology

## 2012-03-27 VITALS — BP 124/70 | HR 54 | Ht 63.0 in | Wt 135.8 lb

## 2012-03-27 DIAGNOSIS — E785 Hyperlipidemia, unspecified: Secondary | ICD-10-CM

## 2012-03-27 DIAGNOSIS — I2510 Atherosclerotic heart disease of native coronary artery without angina pectoris: Secondary | ICD-10-CM | POA: Diagnosis not present

## 2012-03-27 DIAGNOSIS — I10 Essential (primary) hypertension: Secondary | ICD-10-CM | POA: Diagnosis not present

## 2012-03-27 NOTE — Assessment & Plan Note (Signed)
She continues to do very well from a cardiac standpoint and is asymptomatic. We will continue therapy with Plavix and metoprolol. Continue statin therapy.

## 2012-03-27 NOTE — Telephone Encounter (Signed)
New Msg: Pt calling wanting to speak with nurse/MD regarding pt d/c sheet today. Pt has some questions about things on the sheet. Please return pt call to discuss further.

## 2012-03-27 NOTE — Assessment & Plan Note (Signed)
Blood pressure is well controlled on combination of nifedipine and metoprolol.

## 2012-03-27 NOTE — Patient Instructions (Signed)
Continue your current medication.  I will see you again in 6 months.   

## 2012-03-27 NOTE — Telephone Encounter (Signed)
Patient called, stating on her after visit summary a Dr.Peter Amador Cunas was listed as part of her PCP care team.States that is wrong,she has never seen Dr.Kwiatkowski.States her PCP is Dr.Douglas Clelia Croft.

## 2012-03-27 NOTE — Progress Notes (Signed)
HPI Mrs. Escareno is seen today for followup. She reports she has been doing well from a cardiac standpoint.  She has a history of coronary disease in his head prior angioplasty of the diagonal branch in 1992. She had stenting of the right coronary in 2005. She denies any current chest pain or shortness of breath. She is limited by chronic back pain and is frustrated that she can't walk more.  Allergies  Allergen Reactions  . Tetanus Toxoids   . Adhesive (Tape) Rash    Current Outpatient Prescriptions on File Prior to Visit  Medication Sig Dispense Refill  . aspirin 81 MG tablet Take 162 mg by mouth daily.       [name] Kitchen atorvastatin (LIPITOR) 40 MG tablet Take 40 mg by mouth daily.        . B Complex Vitamins (VITAMIN-B COMPLEX PO) Take by mouth every other day.       . Cholecalciferol (VITAMIN D PO) Take by mouth daily.        . clopidogrel (PLAVIX) 75 MG tablet Take 75 mg by mouth daily.        . Cyanocobalamin (VITAMIN B-12 PO) Take by mouth every other day.       . fish oil-omega-3 fatty acids 1000 MG capsule Take 1 g by mouth every other day.       . levocetirizine (XYZAL) 5 MG tablet Take 5 mg by mouth as needed.       . meloxicam (MOBIC) 7.5 MG tablet Take 2 tablets (15 mg total) by mouth as needed for pain.  60 tablet    . metoprolol (LOPRESSOR) 50 MG tablet Take 1 tablet (50 mg total) by mouth daily.  60 tablet    . Multiple Vitamin (MULTIVITAMIN) tablet Take 1 tablet by mouth daily.        [name] Kitchen NIFEdipine (PROCARDIA XL/ADALAT-CC) 60 MG 24 hr tablet Take 1 tablet (60 mg total) by mouth daily.  90 tablet  3  . Nutritional Supplements (MELATONIN PO) Take by mouth daily as needed.        . ranitidine (ZANTAC) 300 MG capsule Take 300 mg by mouth daily.        [name] Kitchen DISCONTD: NIFEdipine (PROCARDIA-XL/ADALAT CC) 60 MG 24 hr tablet TAKE 1 TABLET DAILY  90 tablet  2    Past Medical History  Diagnosis Date  . CAD (coronary artery disease)   . HTN (hypertension)   . Hypercholesteremia   . GERD  (gastroesophageal reflux disease)   . Anxiety and depression   . Aortic stenosis, mild   . Lateral myocardial infarction 1992  . Scoliosis   . Osteoarthritis   . Diverticula of colon   . Osteopenia     Past Surgical History  Procedure Date  . Coronary angioplasty   . Appendectomy   . Tonsillectomy   . Abdominal hysterectomy     Family History  Problem Relation Age of Onset  . Hypertension Father   . Heart failure Mother   . Hypertension Mother   . Cancer Brother     History   Social History  . Marital Status: Divorced    Spouse Name: N/A    Number of Children: 1  . Years of Education: N/A   Occupational History  . retired    Social History Main Topics  . Smoking status: Former Smoker    Types: Cigarettes    Quit date: 12/26/1991  . Smokeless tobacco: Not on file  . Alcohol Use: No  .  Drug Use: No  . Sexually Active: Not on file   Other Topics Concern  . Not on file   Social History Narrative  . No narrative on file    ROS She does complain of pain in her back. She has chronic scoliosis. She particularly has pain in her right hip which significantly limits her activity. She's had no edema or orthopnea. All other systems are reviewed and are negative.  PHYSICAL EXAM BP 124/70  Pulse 54  Ht 5\' 3"  (1.6 m)  Wt 135 lb 12.8 oz (61.598 kg)  BMI 24.06 kg/m2 She is an elderly female in no acute distress. She is normocephalic atraumatic. Pupils are equal round and reactive to light and accommodation. Sclera are clear. Oropharynx is clear. Neck is supple no JVD, adenopathy, thyromegaly, or bruits. Lungs are clear. Cardiac exam reveals a harsh grade 2/6 systolic ejection murmur right upper border. PMI is normal. There is no S3. Abdomen is soft and nontender he has no masses or bruits. Extremities are without edema. Pedal pulses are 2+.  Laboratory data:   ASSESSMENT AND PLAN

## 2012-06-28 ENCOUNTER — Encounter: Payer: Self-pay | Admitting: Cardiology

## 2012-06-28 DIAGNOSIS — M949 Disorder of cartilage, unspecified: Secondary | ICD-10-CM | POA: Diagnosis not present

## 2012-06-28 DIAGNOSIS — E785 Hyperlipidemia, unspecified: Secondary | ICD-10-CM | POA: Diagnosis not present

## 2012-06-28 DIAGNOSIS — R82998 Other abnormal findings in urine: Secondary | ICD-10-CM | POA: Diagnosis not present

## 2012-06-28 DIAGNOSIS — I10 Essential (primary) hypertension: Secondary | ICD-10-CM | POA: Diagnosis not present

## 2012-07-05 DIAGNOSIS — I10 Essential (primary) hypertension: Secondary | ICD-10-CM | POA: Diagnosis not present

## 2012-07-05 DIAGNOSIS — I2510 Atherosclerotic heart disease of native coronary artery without angina pectoris: Secondary | ICD-10-CM | POA: Diagnosis not present

## 2012-07-05 DIAGNOSIS — E785 Hyperlipidemia, unspecified: Secondary | ICD-10-CM | POA: Diagnosis not present

## 2012-07-05 DIAGNOSIS — Z0000 Encounter for general adult medical examination without abnormal findings: Secondary | ICD-10-CM | POA: Diagnosis not present

## 2012-07-08 DIAGNOSIS — Z1212 Encounter for screening for malignant neoplasm of rectum: Secondary | ICD-10-CM | POA: Diagnosis not present

## 2012-09-17 DIAGNOSIS — L259 Unspecified contact dermatitis, unspecified cause: Secondary | ICD-10-CM | POA: Diagnosis not present

## 2012-09-17 DIAGNOSIS — L821 Other seborrheic keratosis: Secondary | ICD-10-CM | POA: Diagnosis not present

## 2012-09-17 DIAGNOSIS — I781 Nevus, non-neoplastic: Secondary | ICD-10-CM | POA: Diagnosis not present

## 2012-10-08 ENCOUNTER — Encounter: Payer: Self-pay | Admitting: Cardiology

## 2012-10-08 ENCOUNTER — Ambulatory Visit (INDEPENDENT_AMBULATORY_CARE_PROVIDER_SITE_OTHER): Payer: Medicare Other | Admitting: Cardiology

## 2012-10-08 VITALS — BP 140/70 | HR 56 | Ht 63.0 in | Wt 130.8 lb

## 2012-10-08 DIAGNOSIS — I10 Essential (primary) hypertension: Secondary | ICD-10-CM

## 2012-10-08 DIAGNOSIS — I2510 Atherosclerotic heart disease of native coronary artery without angina pectoris: Secondary | ICD-10-CM | POA: Diagnosis not present

## 2012-10-08 DIAGNOSIS — E7800 Pure hypercholesterolemia, unspecified: Secondary | ICD-10-CM | POA: Diagnosis not present

## 2012-10-08 DIAGNOSIS — M899 Disorder of bone, unspecified: Secondary | ICD-10-CM | POA: Diagnosis not present

## 2012-10-08 DIAGNOSIS — I350 Nonrheumatic aortic (valve) stenosis: Secondary | ICD-10-CM

## 2012-10-08 DIAGNOSIS — M949 Disorder of cartilage, unspecified: Secondary | ICD-10-CM | POA: Diagnosis not present

## 2012-10-08 DIAGNOSIS — Z803 Family history of malignant neoplasm of breast: Secondary | ICD-10-CM | POA: Diagnosis not present

## 2012-10-08 DIAGNOSIS — Z1231 Encounter for screening mammogram for malignant neoplasm of breast: Secondary | ICD-10-CM | POA: Diagnosis not present

## 2012-10-08 DIAGNOSIS — I359 Nonrheumatic aortic valve disorder, unspecified: Secondary | ICD-10-CM

## 2012-10-08 NOTE — Patient Instructions (Signed)
Continue your current therapy  I will see you again in 6 months.   

## 2012-10-08 NOTE — Progress Notes (Signed)
HPI [name] is seen today for followup.   She has a history of coronary disease in his head prior angioplasty of the diagonal branch in 1992. She had stenting of the right coronary in 2005. On followup today she reports she is doing very well. She denies any symptoms of chest pain or shortness of breath. Last week she did experience some symptoms of vertigo that lasted about 3 days. She states she does get dizzy but didn't have any other significant complaints. She does complain of a lot of leg cramps at night. Her blood pressure at home has been well controlled. She does have some chronic back problems that limit her walking.  Allergies  Allergen Reactions  . Tetanus Toxoids   . Adhesive (Tape) Rash    Current Outpatient Prescriptions on File Prior to Visit  Medication Sig Dispense Refill  . aspirin 81 MG tablet Take 162 mg by mouth daily.       [name] Kitchen atorvastatin (LIPITOR) 40 MG tablet Take 40 mg by mouth daily.        . B Complex Vitamins (VITAMIN-B COMPLEX PO) Take by mouth every other day.       . Cholecalciferol (VITAMIN D PO) Take by mouth daily.        . clopidogrel (PLAVIX) 75 MG tablet Take 75 mg by mouth daily.        . Cyanocobalamin (VITAMIN B-12 PO) Take by mouth every other day.       . fish oil-omega-3 fatty acids 1000 MG capsule Take 1 g by mouth every other day.       . levocetirizine (XYZAL) 5 MG tablet Take 5 mg by mouth as needed.       . meloxicam (MOBIC) 7.5 MG tablet Take 2 tablets (15 mg total) by mouth as needed for pain.  60 tablet    . metoprolol (LOPRESSOR) 50 MG tablet Take 1 tablet (50 mg total) by mouth daily.  60 tablet    . Multiple Vitamin (MULTIVITAMIN) tablet Take 1 tablet by mouth daily.        [name] Kitchen NIFEdipine (PROCARDIA XL/ADALAT-CC) 60 MG 24 hr tablet Take 1 tablet (60 mg total) by mouth daily.  90 tablet  3  . Nutritional Supplements (MELATONIN PO) Take by mouth daily as needed.        . ranitidine (ZANTAC) 300 MG capsule Take 300 mg by mouth daily.            Past Medical History  Diagnosis Date  . CAD (coronary artery disease)   . HTN (hypertension)   . Hypercholesteremia   . GERD (gastroesophageal reflux disease)   . Anxiety and depression   . Aortic stenosis, mild   . Lateral myocardial infarction 1992  . Scoliosis   . Osteoarthritis   . Diverticula of colon   . Osteopenia     Past Surgical History  Procedure Date  . Coronary angioplasty   . Appendectomy   . Tonsillectomy   . Abdominal hysterectomy     Family History  Problem Relation Age of Onset  . Hypertension Father   . Heart failure Mother   . Hypertension Mother   . Cancer Brother     History   Social History  . Marital Status: Divorced    Spouse Name: N/A    Number of Children: 1  . Years of Education: N/A   Occupational History  . retired    Social History Main Topics  . Smoking status: Former Smoker  Types: Cigarettes    Quit date: 12/26/1991  . Smokeless tobacco: Not on file  . Alcohol Use: No  . Drug Use: No  . Sexually Active: Not on file   Other Topics Concern  . Not on file   Social History Narrative  . No narrative on file    ROS She does complain of pain in her back. She has chronic scoliosis.  She's had no edema or orthopnea. All other systems are reviewed and are negative.   PHYSICAL EXAM BP 140/70  Pulse 56  Ht 5\' 3"  (1.6 m)  Wt 59.33 kg (130 lb 12.8 oz)  BMI 23.17 kg/m2  SpO2 96% She is an elderly female in no acute distress. She is normocephalic atraumatic. Pupils are equal round and reactive to light and accommodation. Sclera are clear. Oropharynx is clear. Neck is supple no JVD, adenopathy, thyromegaly, or bruits. Lungs are clear. Cardiac exam reveals a harsh grade 2/6 systolic ejection murmur right upper border. This radiates into the carotids. PMI is normal. There is no S3. Abdomen is soft and nontender he has no masses or bruits. Extremities are without edema. Pedal pulses are 2+.  Laboratory data:  ECG  demonstrates sinus bradycardia with a rate of 50 beats per minute. She has an incomplete right bundle branch block and nonspecific ST-T wave changes. This is unchanged from prior studies.  ASSESSMENT AND PLAN  1. Coronary disease with prior PCI. She is asymptomatic. She is on dual antiplatelet therapy, metoprolol, and nifedipine.  2. Aortic stenosis, mild by prior echocardiogram in September 2011. Peak gradient by echo was 22 mmHg with a mean gradient of 10 mm of mercury. Recommend followup echocardiogram next year.  3. Hypertension, controlled.  4. Hypercholesterolemia, controlled on atorvastatin. Laboratory data was reviewed from July and was acceptable.

## 2012-10-16 ENCOUNTER — Encounter: Payer: Self-pay | Admitting: Cardiology

## 2012-11-01 ENCOUNTER — Telehealth: Payer: Self-pay | Admitting: Cardiology

## 2012-11-01 NOTE — Telephone Encounter (Signed)
Patient called stated she already found answer she needed.Stated she was doing good.

## 2012-11-01 NOTE — Telephone Encounter (Signed)
plz return call to pt [phone] regarding questions for 10/08/12 appnt.

## 2012-11-01 NOTE — Telephone Encounter (Signed)
Pt called back to say she found the information she was look for, no need for Cheryl P to return pt call.

## 2013-04-10 ENCOUNTER — Other Ambulatory Visit: Payer: Self-pay

## 2013-04-10 ENCOUNTER — Ambulatory Visit (INDEPENDENT_AMBULATORY_CARE_PROVIDER_SITE_OTHER): Payer: Medicare Other | Admitting: Cardiology

## 2013-04-10 ENCOUNTER — Encounter: Payer: Self-pay | Admitting: Cardiology

## 2013-04-10 VITALS — BP 166/84 | HR 57 | Ht 63.0 in | Wt 124.4 lb

## 2013-04-10 DIAGNOSIS — E7800 Pure hypercholesterolemia, unspecified: Secondary | ICD-10-CM

## 2013-04-10 DIAGNOSIS — I10 Essential (primary) hypertension: Secondary | ICD-10-CM

## 2013-04-10 DIAGNOSIS — I350 Nonrheumatic aortic (valve) stenosis: Secondary | ICD-10-CM

## 2013-04-10 DIAGNOSIS — I359 Nonrheumatic aortic valve disorder, unspecified: Secondary | ICD-10-CM | POA: Diagnosis not present

## 2013-04-10 DIAGNOSIS — I2510 Atherosclerotic heart disease of native coronary artery without angina pectoris: Secondary | ICD-10-CM

## 2013-04-10 MED ORDER — HYDROCHLOROTHIAZIDE 25 MG PO TABS: 25.0000 mg | ORAL_TABLET | Freq: Every day | ORAL | Status: DC

## 2013-04-10 NOTE — Patient Instructions (Addendum)
Stop taking the lipitor and see if your muscle pain resolves. Give it 4 weeks. If your pain resolves we should consider alternative treatment  Start HCTZ 25 mg daily for blood pressure.  Continue your other medication  We will check your kidney function and potassium in 3 weeks.
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
## 2013-04-16 ENCOUNTER — Encounter: Payer: Self-pay | Admitting: Cardiology

## 2013-04-17 ENCOUNTER — Encounter: Payer: Self-pay | Admitting: Cardiology

## 2013-04-29 DIAGNOSIS — IMO0002 Reserved for concepts with insufficient information to code with codable children: Secondary | ICD-10-CM | POA: Diagnosis not present

## 2013-04-29 DIAGNOSIS — M810 Age-related osteoporosis without current pathological fracture: Secondary | ICD-10-CM | POA: Diagnosis not present

## 2013-05-01 ENCOUNTER — Other Ambulatory Visit (INDEPENDENT_AMBULATORY_CARE_PROVIDER_SITE_OTHER): Payer: Medicare Other

## 2013-05-01 DIAGNOSIS — I10 Essential (primary) hypertension: Secondary | ICD-10-CM | POA: Diagnosis not present

## 2013-05-01 LAB — BASIC METABOLIC PANEL
Calcium: 8.7 mg/dL (ref 8.4–10.5)
GFR: 91.27 mL/min (ref >60.00)
Sodium: 138 mEq/L (ref 135–145)

## 2013-05-21 ENCOUNTER — Telehealth: Payer: Self-pay | Admitting: Cardiology

## 2013-05-21 DIAGNOSIS — E7800 Pure hypercholesterolemia, unspecified: Secondary | ICD-10-CM

## 2013-05-21 NOTE — Telephone Encounter (Signed)
New problem     Pt was told to call after she stops medication for a while

## 2013-05-21 NOTE — Telephone Encounter (Signed)
F/u ° ° °Pt returning your call °

## 2013-05-21 NOTE — Telephone Encounter (Signed)
I would like to try her on pravastatin 10 mg. Let us know if her myalgias resume. Otherwise check lipids and LFTs in 3 months.  Peter [place] MD, [facility]

## 2013-05-21 NOTE — Telephone Encounter (Signed)
Patient called no answer.LMTC. 

## 2013-05-21 NOTE — Telephone Encounter (Signed)
Patient called she stated she stopped atorvastatin 1 month ago and her leg cramps are gone.Stated she was told to call back and Dr.Jordan would prescribe another generic cholesterol medication.Message sent to Dr.Jordan.

## 2013-05-22 MED ORDER — PRAVASTATIN SODIUM 10 MG PO TABS: 10.0000 mg | ORAL_TABLET | Freq: Every day | ORAL | Status: DC

## 2013-05-22 NOTE — Telephone Encounter (Signed)
Returned call to patient Dr.Jordan advised to start pravastatin 10 mg daily.Advised to call back if myalgias return.Lipids and hepatic panels in 3 months.

## 2013-05-22 NOTE — Addendum Note (Signed)
Addended by: Meda Klinefelter D on: 05/22/2013 11:07 AM   Modules accepted: Orders, Medications

## 2013-06-03 ENCOUNTER — Telehealth: Payer: Self-pay | Admitting: Cardiology

## 2013-06-03 MED ORDER — METOPROLOL TARTRATE 50 MG PO TABS: 50.0000 mg | ORAL_TABLET | Freq: Every day | ORAL | Status: DC

## 2013-06-03 MED ORDER — CLOPIDOGREL BISULFATE 75 MG PO TABS: 75.0000 mg | ORAL_TABLET | Freq: Every day | ORAL | Status: DC

## 2013-06-03 NOTE — Telephone Encounter (Signed)
New problem    Pt needs you to call her because she feels dizzy and thinks she's had a drop in her b/p

## 2013-06-03 NOTE — Telephone Encounter (Signed)
Returned call to patient she stated she has been dizzy since yesterday 06/02/13.Stated B/P 110/63,124/72,130/72,148/78.This morning B/P 150/86,131/84.Stated would like be to be seen.Patient request Dr.Jordan only.Appointment scheduled with Dr.Jordan 06/04/13.

## 2013-06-05 ENCOUNTER — Encounter: Payer: Self-pay | Admitting: Cardiology

## 2013-06-05 ENCOUNTER — Ambulatory Visit (INDEPENDENT_AMBULATORY_CARE_PROVIDER_SITE_OTHER): Payer: Medicare Other | Admitting: Cardiology

## 2013-06-05 VITALS — BP 152/80 | HR 59 | Ht 63.0 in | Wt 130.8 lb

## 2013-06-05 DIAGNOSIS — I2510 Atherosclerotic heart disease of native coronary artery without angina pectoris: Secondary | ICD-10-CM | POA: Diagnosis not present

## 2013-06-05 DIAGNOSIS — I350 Nonrheumatic aortic (valve) stenosis: Secondary | ICD-10-CM

## 2013-06-05 DIAGNOSIS — R42 Dizziness and giddiness: Secondary | ICD-10-CM

## 2013-06-05 DIAGNOSIS — I359 Nonrheumatic aortic valve disorder, unspecified: Secondary | ICD-10-CM | POA: Diagnosis not present

## 2013-06-05 NOTE — Patient Instructions (Signed)
Continue your current therapy  Call me if your dizzyness persists.  We will keep your scheduled follow up

## 2013-06-05 NOTE — Progress Notes (Signed)
HPI [name] is seen today for evaluation of dizziness.   She has a history of coronary disease in his head prior angioplasty of the diagonal branch in 1992. She had stenting of the right coronary in 2005. She was just seen 2 months ago and was noted to be hypertensive. We added HCTZ. Followup basic metabolic panel last month was normal. 2 days ago she states she became very dizzy. This is associated with weakness and staggering where she felt like she could not walk down the hall. Her symptoms have steadily improved since then and are essentially resolved at this time. Her blood pressure was stable. She denies any other neurologic symptoms.  Allergies  Allergen Reactions  . Tetanus Toxoids   . Adhesive (Tape) Rash    Current Outpatient Prescriptions on File Prior to Visit  Medication Sig Dispense Refill  . aspirin 81 MG tablet Take 162 mg by mouth daily.       . B Complex Vitamins (VITAMIN-B COMPLEX PO) Take by mouth every other day.       . Cholecalciferol (VITAMIN D PO) Take by mouth daily.        . clopidogrel (PLAVIX) 75 MG tablet Take 1 tablet (75 mg total) by mouth daily.  90 tablet  3  . Cyanocobalamin (VITAMIN B-12 PO) Take by mouth every other day.       . fish oil-omega-3 fatty acids 1000 MG capsule Take 1 g by mouth every other day.       . hydrochlorothiazide (HYDRODIURIL) 25 MG tablet Take 1 tablet (25 mg total) by mouth daily.  90 tablet  3  . levocetirizine (XYZAL) 5 MG tablet Take 5 mg by mouth as needed.       . meloxicam (MOBIC) 7.5 MG tablet Take 2 tablets (15 mg total) by mouth as needed for pain.  60 tablet    . metoprolol (LOPRESSOR) 50 MG tablet Take 1 tablet (50 mg total) by mouth daily.  90 tablet  3  . Multiple Vitamin (MULTIVITAMIN) tablet Take 1 tablet by mouth daily.        [name] Kitchen NIFEdipine (PROCARDIA XL/ADALAT-CC) 60 MG 24 hr tablet Take 1 tablet (60 mg total) by mouth daily.  90 tablet  3  . NON FORMULARY hylands (for leg cramps)      . Nutritional Supplements  (MELATONIN PO) Take by mouth daily as needed.        . pravastatin (PRAVACHOL) 10 MG tablet Take 1 tablet (10 mg total) by mouth daily.  90 tablet  3  . ranitidine (ZANTAC) 300 MG capsule Take 300 mg by mouth daily.         No current facility-administered medications on file prior to visit.    Past Medical History  Diagnosis Date  . CAD (coronary artery disease)   . HTN (hypertension)   . Hypercholesteremia   . GERD (gastroesophageal reflux disease)   . Anxiety and depression   . Aortic stenosis, mild   . Lateral myocardial infarction 1992  . Scoliosis   . Osteoarthritis   . Diverticula of colon   . Osteopenia     Past Surgical History  Procedure Laterality Date  . Coronary angioplasty    . Appendectomy    . Tonsillectomy    . Abdominal hysterectomy      Family History  Problem Relation Age of Onset  . Hypertension Father   . Heart failure Mother   . Hypertension Mother   . Cancer Brother  History   Social History  . Marital Status: Divorced    Spouse Name: N/A    Number of Children: 1  . Years of Education: N/A   Occupational History  . retired    Social History Main Topics  . Smoking status: Former Smoker    Types: Cigarettes    Quit date: 12/26/1991  . Smokeless tobacco: Not on file  . Alcohol Use: No  . Drug Use: No  . Sexually Active: Not on file   Other Topics Concern  . Not on file   Social History Narrative  . No narrative on file    ROS She does complain of pain in her back. She has chronic scoliosis.   All other systems are reviewed and are negative.   PHYSICAL EXAM BP 152/80  Pulse 59  Ht 5\' 3"  (1.6 m)  Wt 130 lb 12.8 oz (59.33 kg)  BMI 23.18 kg/m2  SpO2 96% orthostatic blood pressure check demonstrates blood pressure 160 or 80 supine, 162/80 in, 152/80 standing. She is an elderly female in no acute distress. She is normocephalic atraumatic. Pupils are equal round and reactive to light and accommodation. Sclera are clear.  Oropharynx is clear. Neck is supple no JVD, adenopathy, thyromegaly, or bruits. Lungs are clear. Cardiac exam reveals a harsh grade 2/6 systolic ejection murmur right upper border. This radiates into the carotids. PMI is normal. There is no S3. Abdomen is soft and nontender he has no masses or bruits. Extremities are without edema. Pedal pulses are 2+. Neurologic exam reveals that she is alert and oriented x3. Cranial nerves II through XII are intact. She has normal motor and sensory exam. Gait is normal.  Laboratory data:    ASSESSMENT AND PLAN  1. Coronary disease with prior PCI. She is asymptomatic. She is on dual antiplatelet therapy, metoprolol, and nifedipine.  2. Aortic stenosis, mild by prior echocardiogram in September 2011. Peak gradient by echo was 22 mmHg with a mean gradient of 10 mm of mercury. We will plan on repeating an echocardiogram in 6 months.  3. Hypertension, improved control with the addition of HCTZ.  4. Hypercholesterolemia, controlled on atorvastatin.  5. Dizziness. No clear etiology. She is non-orthostatic. Blood pressure is stable. No other neurologic sequela. Since her symptoms have largely resolved we will continue to monitor for now.

## 2013-06-26 ENCOUNTER — Telehealth: Payer: Self-pay | Admitting: Cardiology

## 2013-06-26 NOTE — Telephone Encounter (Signed)
New Prob     Pt has a questions regarding METOPROLOL. Please call.

## 2013-06-26 NOTE — Telephone Encounter (Signed)
Returned call to patient she stated she received new prescription for metoprolol.Stated she use to take metoprolol er 50 mg daily.Stated just received 90 day supply metoprolol tartrate 50 mg.Advised to take metoprolol tartrate 50 mg 1/2 twice a day.Stated when she finishes she wants metoprolol er called in.Stated she will call back when she finishes metoprolol tartrate.

## 2013-07-01 DIAGNOSIS — I2510 Atherosclerotic heart disease of native coronary artery without angina pectoris: Secondary | ICD-10-CM | POA: Diagnosis not present

## 2013-07-01 DIAGNOSIS — I10 Essential (primary) hypertension: Secondary | ICD-10-CM | POA: Diagnosis not present

## 2013-07-01 DIAGNOSIS — E785 Hyperlipidemia, unspecified: Secondary | ICD-10-CM | POA: Diagnosis not present

## 2013-07-01 DIAGNOSIS — M810 Age-related osteoporosis without current pathological fracture: Secondary | ICD-10-CM | POA: Diagnosis not present

## 2013-07-08 DIAGNOSIS — Z0000 Encounter for general adult medical examination without abnormal findings: Secondary | ICD-10-CM | POA: Diagnosis not present

## 2013-07-08 DIAGNOSIS — R82998 Other abnormal findings in urine: Secondary | ICD-10-CM | POA: Diagnosis not present

## 2013-07-08 DIAGNOSIS — F341 Dysthymic disorder: Secondary | ICD-10-CM | POA: Diagnosis not present

## 2013-07-08 DIAGNOSIS — M4800 Spinal stenosis, site unspecified: Secondary | ICD-10-CM | POA: Diagnosis not present

## 2013-07-08 DIAGNOSIS — I2510 Atherosclerotic heart disease of native coronary artery without angina pectoris: Secondary | ICD-10-CM | POA: Diagnosis not present

## 2013-07-08 DIAGNOSIS — I10 Essential (primary) hypertension: Secondary | ICD-10-CM | POA: Diagnosis not present

## 2013-07-08 DIAGNOSIS — Z23 Encounter for immunization: Secondary | ICD-10-CM | POA: Diagnosis not present

## 2013-07-08 DIAGNOSIS — IMO0002 Reserved for concepts with insufficient information to code with codable children: Secondary | ICD-10-CM | POA: Diagnosis not present

## 2013-07-08 DIAGNOSIS — E785 Hyperlipidemia, unspecified: Secondary | ICD-10-CM | POA: Diagnosis not present

## 2013-07-08 DIAGNOSIS — M810 Age-related osteoporosis without current pathological fracture: Secondary | ICD-10-CM | POA: Diagnosis not present

## 2013-07-10 DIAGNOSIS — Z1212 Encounter for screening for malignant neoplasm of rectum: Secondary | ICD-10-CM | POA: Diagnosis not present

## 2013-07-28 DIAGNOSIS — M545 Low back pain: Secondary | ICD-10-CM | POA: Diagnosis not present

## 2013-07-28 DIAGNOSIS — M542 Cervicalgia: Secondary | ICD-10-CM | POA: Diagnosis not present

## 2013-08-06 ENCOUNTER — Ambulatory Visit: Payer: Medicare Other | Attending: Orthopaedic Surgery | Admitting: Physical Therapy

## 2013-08-06 DIAGNOSIS — Z9181 History of falling: Secondary | ICD-10-CM | POA: Insufficient documentation

## 2013-08-06 DIAGNOSIS — M542 Cervicalgia: Secondary | ICD-10-CM | POA: Insufficient documentation

## 2013-08-06 DIAGNOSIS — R209 Unspecified disturbances of skin sensation: Secondary | ICD-10-CM | POA: Insufficient documentation

## 2013-08-06 DIAGNOSIS — IMO0001 Reserved for inherently not codable concepts without codable children: Secondary | ICD-10-CM | POA: Insufficient documentation

## 2013-08-06 DIAGNOSIS — R293 Abnormal posture: Secondary | ICD-10-CM | POA: Insufficient documentation

## 2013-08-12 ENCOUNTER — Ambulatory Visit: Payer: Medicare Other | Admitting: Physical Therapy

## 2013-08-12 DIAGNOSIS — R293 Abnormal posture: Secondary | ICD-10-CM | POA: Diagnosis not present

## 2013-08-12 DIAGNOSIS — M542 Cervicalgia: Secondary | ICD-10-CM | POA: Diagnosis not present

## 2013-08-12 DIAGNOSIS — R209 Unspecified disturbances of skin sensation: Secondary | ICD-10-CM | POA: Diagnosis not present

## 2013-08-12 DIAGNOSIS — Z9181 History of falling: Secondary | ICD-10-CM | POA: Diagnosis not present

## 2013-08-12 DIAGNOSIS — IMO0001 Reserved for inherently not codable concepts without codable children: Secondary | ICD-10-CM | POA: Diagnosis not present

## 2013-08-14 ENCOUNTER — Ambulatory Visit: Payer: Medicare Other | Admitting: Physical Therapy

## 2013-08-14 DIAGNOSIS — R209 Unspecified disturbances of skin sensation: Secondary | ICD-10-CM | POA: Diagnosis not present

## 2013-08-14 DIAGNOSIS — M542 Cervicalgia: Secondary | ICD-10-CM | POA: Diagnosis not present

## 2013-08-14 DIAGNOSIS — R293 Abnormal posture: Secondary | ICD-10-CM | POA: Diagnosis not present

## 2013-08-14 DIAGNOSIS — Z9181 History of falling: Secondary | ICD-10-CM | POA: Diagnosis not present

## 2013-08-14 DIAGNOSIS — IMO0001 Reserved for inherently not codable concepts without codable children: Secondary | ICD-10-CM | POA: Diagnosis not present

## 2013-08-18 DIAGNOSIS — H2510 Age-related nuclear cataract, unspecified eye: Secondary | ICD-10-CM | POA: Diagnosis not present

## 2013-08-18 DIAGNOSIS — H04129 Dry eye syndrome of unspecified lacrimal gland: Secondary | ICD-10-CM | POA: Diagnosis not present

## 2013-08-19 ENCOUNTER — Ambulatory Visit: Payer: Medicare Other | Admitting: Physical Therapy

## 2013-08-19 DIAGNOSIS — IMO0001 Reserved for inherently not codable concepts without codable children: Secondary | ICD-10-CM | POA: Diagnosis not present

## 2013-08-19 DIAGNOSIS — R209 Unspecified disturbances of skin sensation: Secondary | ICD-10-CM | POA: Diagnosis not present

## 2013-08-19 DIAGNOSIS — R293 Abnormal posture: Secondary | ICD-10-CM | POA: Diagnosis not present

## 2013-08-19 DIAGNOSIS — M542 Cervicalgia: Secondary | ICD-10-CM | POA: Diagnosis not present

## 2013-08-19 DIAGNOSIS — Z9181 History of falling: Secondary | ICD-10-CM | POA: Diagnosis not present

## 2013-08-21 ENCOUNTER — Ambulatory Visit: Payer: Medicare Other | Admitting: Physical Therapy

## 2013-08-21 DIAGNOSIS — M542 Cervicalgia: Secondary | ICD-10-CM | POA: Diagnosis not present

## 2013-08-21 DIAGNOSIS — Z9181 History of falling: Secondary | ICD-10-CM | POA: Diagnosis not present

## 2013-08-21 DIAGNOSIS — R293 Abnormal posture: Secondary | ICD-10-CM | POA: Diagnosis not present

## 2013-08-21 DIAGNOSIS — R209 Unspecified disturbances of skin sensation: Secondary | ICD-10-CM | POA: Diagnosis not present

## 2013-08-21 DIAGNOSIS — IMO0001 Reserved for inherently not codable concepts without codable children: Secondary | ICD-10-CM | POA: Diagnosis not present

## 2013-08-22 ENCOUNTER — Other Ambulatory Visit (INDEPENDENT_AMBULATORY_CARE_PROVIDER_SITE_OTHER): Payer: Medicare Other

## 2013-08-22 DIAGNOSIS — E7800 Pure hypercholesterolemia, unspecified: Secondary | ICD-10-CM

## 2013-08-22 LAB — HEPATIC FUNCTION PANEL
ALT: 17 U/L (ref 0–35)
Albumin: 3.9 g/dL (ref 3.5–5.2)
Total Protein: 7.2 g/dL (ref 6.0–8.3)

## 2013-08-22 LAB — LIPID PANEL
Cholesterol: 146 mg/dL (ref 0–200)
HDL: 60 mg/dL (ref >39.00)
LDL Cholesterol: 62 mg/dL (ref 0–99)
Triglycerides: 120 mg/dL (ref 0.0–149.0)
VLDL: 24 mg/dL (ref 0.0–40.0)

## 2013-08-26 ENCOUNTER — Ambulatory Visit: Payer: Medicare Other | Attending: Orthopaedic Surgery | Admitting: Physical Therapy

## 2013-08-26 DIAGNOSIS — M542 Cervicalgia: Secondary | ICD-10-CM | POA: Diagnosis not present

## 2013-08-26 DIAGNOSIS — R209 Unspecified disturbances of skin sensation: Secondary | ICD-10-CM | POA: Insufficient documentation

## 2013-08-26 DIAGNOSIS — R293 Abnormal posture: Secondary | ICD-10-CM | POA: Diagnosis not present

## 2013-08-26 DIAGNOSIS — Z9181 History of falling: Secondary | ICD-10-CM | POA: Diagnosis not present

## 2013-08-26 DIAGNOSIS — IMO0001 Reserved for inherently not codable concepts without codable children: Secondary | ICD-10-CM | POA: Diagnosis not present

## 2013-08-27 ENCOUNTER — Telehealth: Payer: Self-pay | Admitting: Cardiology

## 2013-08-28 ENCOUNTER — Ambulatory Visit: Payer: Medicare Other | Admitting: Physical Therapy

## 2013-09-01 ENCOUNTER — Ambulatory Visit: Payer: Medicare Other | Admitting: Physical Therapy

## 2013-09-03 ENCOUNTER — Ambulatory Visit: Payer: Medicare Other | Admitting: Physical Therapy

## 2013-09-03 DIAGNOSIS — Z23 Encounter for immunization: Secondary | ICD-10-CM | POA: Diagnosis not present

## 2013-09-08 ENCOUNTER — Ambulatory Visit: Payer: Medicare Other | Admitting: Physical Therapy

## 2013-09-10 ENCOUNTER — Ambulatory Visit: Payer: Medicare Other | Admitting: Physical Therapy

## 2013-09-16 ENCOUNTER — Ambulatory Visit: Payer: Medicare Other | Admitting: Physical Therapy

## 2013-09-18 ENCOUNTER — Ambulatory Visit: Payer: Medicare Other | Admitting: Physical Therapy

## 2013-09-23 ENCOUNTER — Ambulatory Visit: Payer: Medicare Other | Admitting: Physical Therapy

## 2013-09-25 ENCOUNTER — Ambulatory Visit: Payer: Medicare Other | Attending: Orthopaedic Surgery | Admitting: Physical Therapy

## 2013-09-25 DIAGNOSIS — IMO0001 Reserved for inherently not codable concepts without codable children: Secondary | ICD-10-CM | POA: Insufficient documentation

## 2013-09-25 DIAGNOSIS — Z9181 History of falling: Secondary | ICD-10-CM | POA: Insufficient documentation

## 2013-09-25 DIAGNOSIS — R209 Unspecified disturbances of skin sensation: Secondary | ICD-10-CM | POA: Diagnosis not present

## 2013-09-25 DIAGNOSIS — R293 Abnormal posture: Secondary | ICD-10-CM | POA: Insufficient documentation

## 2013-09-25 DIAGNOSIS — M542 Cervicalgia: Secondary | ICD-10-CM | POA: Diagnosis not present

## 2013-09-29 ENCOUNTER — Ambulatory Visit: Payer: Medicare Other | Admitting: Physical Therapy

## 2013-09-29 DIAGNOSIS — IMO0001 Reserved for inherently not codable concepts without codable children: Secondary | ICD-10-CM | POA: Diagnosis not present

## 2013-09-29 DIAGNOSIS — R209 Unspecified disturbances of skin sensation: Secondary | ICD-10-CM | POA: Diagnosis not present

## 2013-09-29 DIAGNOSIS — Z9181 History of falling: Secondary | ICD-10-CM | POA: Diagnosis not present

## 2013-09-29 DIAGNOSIS — M542 Cervicalgia: Secondary | ICD-10-CM | POA: Diagnosis not present

## 2013-09-29 DIAGNOSIS — R293 Abnormal posture: Secondary | ICD-10-CM | POA: Diagnosis not present

## 2013-10-01 ENCOUNTER — Encounter: Payer: Self-pay | Admitting: Cardiology

## 2013-10-01 ENCOUNTER — Ambulatory Visit (INDEPENDENT_AMBULATORY_CARE_PROVIDER_SITE_OTHER): Payer: Medicare Other | Admitting: Cardiology

## 2013-10-01 VITALS — BP 162/80 | HR 49 | Ht 63.0 in | Wt 131.0 lb

## 2013-10-01 DIAGNOSIS — I359 Nonrheumatic aortic valve disorder, unspecified: Secondary | ICD-10-CM | POA: Diagnosis not present

## 2013-10-01 DIAGNOSIS — I10 Essential (primary) hypertension: Secondary | ICD-10-CM

## 2013-10-01 DIAGNOSIS — I350 Nonrheumatic aortic (valve) stenosis: Secondary | ICD-10-CM

## 2013-10-01 DIAGNOSIS — E7800 Pure hypercholesterolemia, unspecified: Secondary | ICD-10-CM | POA: Diagnosis not present

## 2013-10-01 DIAGNOSIS — I2510 Atherosclerotic heart disease of native coronary artery without angina pectoris: Secondary | ICD-10-CM | POA: Diagnosis not present

## 2013-10-01 MED ORDER — NITROGLYCERIN 0.4 MG SL SUBL: 0.4000 mg | SUBLINGUAL_TABLET | SUBLINGUAL | Status: DC | PRN

## 2013-10-01 NOTE — Patient Instructions (Signed)
Stop taking Plavix. Continue your other therapy  I will see you in 6 months.    

## 2013-10-01 NOTE — Progress Notes (Signed)
HPI [name] is seen today for evaluation of dizziness.   She has a history of coronary disease in his head prior angioplasty of the diagonal branch in 1992. She had stenting of the right coronary in 2005. She has a history of hypertension. She reports she is doing well. Her major complaint is that of back pain. She has been doing physical therapy for the past 6 weeks. She denies any increase shortness of breath or chest pain. She has no palpitations. She reports her blood pressure at home this morning was 142/79.  Allergies  Allergen Reactions  . Tetanus Toxoids   . Adhesive [Tape] Rash    Current Outpatient Prescriptions on File Prior to Visit  Medication Sig Dispense Refill  . aspirin 81 MG tablet Take 162 mg by mouth daily.       . B Complex Vitamins (VITAMIN-B COMPLEX PO) Take by mouth every other day.       . Cholecalciferol (VITAMIN D PO) Take by mouth daily.        . Cyanocobalamin (VITAMIN B-12 PO) Take by mouth every other day.       . fish oil-omega-3 fatty acids 1000 MG capsule Take 1 g by mouth every other day.       . hydrochlorothiazide (HYDRODIURIL) 25 MG tablet Take 1 tablet (25 mg total) by mouth daily.  90 tablet  3  . levocetirizine (XYZAL) 5 MG tablet Take 5 mg by mouth as needed.       . meloxicam (MOBIC) 7.5 MG tablet Take 2 tablets (15 mg total) by mouth as needed for pain.  60 tablet    . metoprolol (LOPRESSOR) 50 MG tablet Take 1 tablet (50 mg total) by mouth daily.  90 tablet  3  . Multiple Vitamin (MULTIVITAMIN) tablet Take 1 tablet by mouth daily.        [name] Kitchen NIFEdipine (PROCARDIA XL/ADALAT-CC) 60 MG 24 hr tablet Take 1 tablet (60 mg total) by mouth daily.  90 tablet  3  . NON FORMULARY hylands (for leg cramps)      . Nutritional Supplements (MELATONIN PO) Take by mouth daily as needed.         No current facility-administered medications on file prior to visit.    Past Medical History  Diagnosis Date  . CAD (coronary artery disease)   . HTN (hypertension)    . Hypercholesteremia   . GERD (gastroesophageal reflux disease)   . Anxiety and depression   . Aortic stenosis, mild   . Lateral myocardial infarction 1992  . Scoliosis   . Osteoarthritis   . Diverticula of colon   . Osteopenia     Past Surgical History  Procedure Laterality Date  . Coronary angioplasty    . Appendectomy    . Tonsillectomy    . Abdominal hysterectomy      Family History  Problem Relation Age of Onset  . Hypertension Father   . Heart failure Mother   . Hypertension Mother   . Cancer Brother     History   Social History  . Marital Status: Divorced    Spouse Name: N/A    Number of Children: 1  . Years of Education: N/A   Occupational History  . retired    Social History Main Topics  . Smoking status: Former Smoker    Types: Cigarettes    Quit date: 12/26/1991  . Smokeless tobacco: Not on file  . Alcohol Use: No  . Drug Use: No  .  Sexual Activity: Not on file   Other Topics Concern  . Not on file   Social History Narrative  . No narrative on file    ROS She does complain of pain in her back. She has chronic scoliosis.   All other systems are reviewed and are negative.   PHYSICAL EXAM BP 162/80  Pulse 49  Ht 5\' 3"  (1.6 m)  Wt 131 lb (59.421 kg)  BMI 23.21 kg/m2 orthostatic blood pressure check demonstrates blood pressure 160 or 80 supine, 162/80 in, 152/80 standing. She is an elderly female in no acute distress. She is normocephalic atraumatic. Pupils are equal round and reactive to light and accommodation. Sclera are clear. Oropharynx is clear. Neck is supple no JVD, adenopathy, thyromegaly, or bruits. Lungs are clear. Cardiac exam reveals a harsh grade 2/6 systolic ejection murmur right upper border. This radiates into the carotids. PMI is normal. There is no S3. Abdomen is soft and nontender he has no masses or bruits. Extremities are without edema. Pedal pulses are 2+. Neurologic exam reveals that she is alert and oriented x3. Cranial  nerves II through XII are intact. She has normal motor and sensory exam. Gait is normal.  Laboratory data:  ECG today shows sinus bradycardia with rate of 49 beats per minute. She is a right bundle branch block.  ASSESSMENT AND PLAN  1. Coronary disease with prior PCI. She is asymptomatic. I recommended stopping Plavix. We'll continue aspirin, metoprolol, and nifedipine.  2. Aortic stenosis, mild by prior echocardiogram in September 2011. Peak gradient by echo was 22 mmHg with a mean gradient of 10 mm of mercury. She is asymptomatic. Exam is really unchanged. We will defer follow up echo for now.  3. Hypertension, improved control with the addition of HCTZ.  4. Hypercholesterolemia, controlled on atorvastatin.

## 2013-10-02 ENCOUNTER — Ambulatory Visit: Payer: Medicare Other | Admitting: Physical Therapy

## 2013-10-02 DIAGNOSIS — M542 Cervicalgia: Secondary | ICD-10-CM | POA: Diagnosis not present

## 2013-10-02 DIAGNOSIS — R209 Unspecified disturbances of skin sensation: Secondary | ICD-10-CM | POA: Diagnosis not present

## 2013-10-02 DIAGNOSIS — R293 Abnormal posture: Secondary | ICD-10-CM | POA: Diagnosis not present

## 2013-10-02 DIAGNOSIS — IMO0001 Reserved for inherently not codable concepts without codable children: Secondary | ICD-10-CM | POA: Diagnosis not present

## 2013-10-02 DIAGNOSIS — Z9181 History of falling: Secondary | ICD-10-CM | POA: Diagnosis not present

## 2013-10-03 ENCOUNTER — Telehealth: Payer: Self-pay | Admitting: Cardiology

## 2013-10-03 NOTE — Telephone Encounter (Signed)
Returned call to patient she stated she wanted her last lab work faxed to PCP Dr.Shaw.08/22/13 lab faxed to Dr.Shaw at fax # [phone].

## 2013-10-03 NOTE — Telephone Encounter (Signed)
New Problem:  Pt states she has a question about one of her meds. Pt states she believes it is the generic of plavix. Please advise

## 2013-10-03 NOTE — Telephone Encounter (Signed)
Spoke to patient she stated she wanted to make sure which medication Dr.Jordan told her to stop.Patient advised ok to stop plavix.

## 2013-10-03 NOTE — Telephone Encounter (Signed)
Returned call to patient phone rings busy. 

## 2013-10-03 NOTE — Telephone Encounter (Signed)
New Problem  Pt request a call back about blood work

## 2013-10-06 ENCOUNTER — Ambulatory Visit: Payer: Medicare Other | Admitting: Physical Therapy

## 2013-10-06 DIAGNOSIS — IMO0001 Reserved for inherently not codable concepts without codable children: Secondary | ICD-10-CM | POA: Diagnosis not present

## 2013-10-06 DIAGNOSIS — Z9181 History of falling: Secondary | ICD-10-CM | POA: Diagnosis not present

## 2013-10-06 DIAGNOSIS — M542 Cervicalgia: Secondary | ICD-10-CM | POA: Diagnosis not present

## 2013-10-06 DIAGNOSIS — R293 Abnormal posture: Secondary | ICD-10-CM | POA: Diagnosis not present

## 2013-10-06 DIAGNOSIS — R209 Unspecified disturbances of skin sensation: Secondary | ICD-10-CM | POA: Diagnosis not present

## 2013-10-08 DIAGNOSIS — Z1231 Encounter for screening mammogram for malignant neoplasm of breast: Secondary | ICD-10-CM | POA: Diagnosis not present

## 2013-10-08 DIAGNOSIS — Z803 Family history of malignant neoplasm of breast: Secondary | ICD-10-CM | POA: Diagnosis not present

## 2013-10-10 ENCOUNTER — Ambulatory Visit: Payer: Medicare Other | Admitting: Rehabilitation

## 2013-10-10 DIAGNOSIS — Z9181 History of falling: Secondary | ICD-10-CM | POA: Diagnosis not present

## 2013-10-10 DIAGNOSIS — IMO0001 Reserved for inherently not codable concepts without codable children: Secondary | ICD-10-CM | POA: Diagnosis not present

## 2013-10-10 DIAGNOSIS — R209 Unspecified disturbances of skin sensation: Secondary | ICD-10-CM | POA: Diagnosis not present

## 2013-10-10 DIAGNOSIS — M542 Cervicalgia: Secondary | ICD-10-CM | POA: Diagnosis not present

## 2013-10-10 DIAGNOSIS — R293 Abnormal posture: Secondary | ICD-10-CM | POA: Diagnosis not present

## 2013-10-13 ENCOUNTER — Ambulatory Visit: Payer: Medicare Other | Admitting: Physical Therapy

## 2013-10-13 DIAGNOSIS — Z9181 History of falling: Secondary | ICD-10-CM | POA: Diagnosis not present

## 2013-10-13 DIAGNOSIS — M542 Cervicalgia: Secondary | ICD-10-CM | POA: Diagnosis not present

## 2013-10-13 DIAGNOSIS — R209 Unspecified disturbances of skin sensation: Secondary | ICD-10-CM | POA: Diagnosis not present

## 2013-10-13 DIAGNOSIS — R293 Abnormal posture: Secondary | ICD-10-CM | POA: Diagnosis not present

## 2013-10-13 DIAGNOSIS — IMO0001 Reserved for inherently not codable concepts without codable children: Secondary | ICD-10-CM | POA: Diagnosis not present

## 2013-10-23 ENCOUNTER — Ambulatory Visit: Payer: Medicare Other | Admitting: Physical Therapy

## 2013-10-27 ENCOUNTER — Ambulatory Visit: Payer: Medicare Other | Attending: Orthopaedic Surgery | Admitting: Physical Therapy

## 2013-10-27 DIAGNOSIS — Z9181 History of falling: Secondary | ICD-10-CM | POA: Diagnosis not present

## 2013-10-27 DIAGNOSIS — M542 Cervicalgia: Secondary | ICD-10-CM | POA: Insufficient documentation

## 2013-10-27 DIAGNOSIS — R209 Unspecified disturbances of skin sensation: Secondary | ICD-10-CM | POA: Insufficient documentation

## 2013-10-27 DIAGNOSIS — R293 Abnormal posture: Secondary | ICD-10-CM | POA: Insufficient documentation

## 2013-10-27 DIAGNOSIS — IMO0001 Reserved for inherently not codable concepts without codable children: Secondary | ICD-10-CM | POA: Diagnosis not present

## 2014-03-03 DIAGNOSIS — E785 Hyperlipidemia, unspecified: Secondary | ICD-10-CM | POA: Diagnosis not present

## 2014-03-03 DIAGNOSIS — L989 Disorder of the skin and subcutaneous tissue, unspecified: Secondary | ICD-10-CM | POA: Diagnosis not present

## 2014-03-03 DIAGNOSIS — I10 Essential (primary) hypertension: Secondary | ICD-10-CM | POA: Diagnosis not present

## 2014-03-03 DIAGNOSIS — Z1331 Encounter for screening for depression: Secondary | ICD-10-CM | POA: Diagnosis not present

## 2014-03-03 DIAGNOSIS — F341 Dysthymic disorder: Secondary | ICD-10-CM | POA: Diagnosis not present

## 2014-03-03 DIAGNOSIS — IMO0002 Reserved for concepts with insufficient information to code with codable children: Secondary | ICD-10-CM | POA: Diagnosis not present

## 2014-03-03 DIAGNOSIS — L259 Unspecified contact dermatitis, unspecified cause: Secondary | ICD-10-CM | POA: Diagnosis not present

## 2014-03-12 DIAGNOSIS — I10 Essential (primary) hypertension: Secondary | ICD-10-CM | POA: Diagnosis not present

## 2014-03-12 DIAGNOSIS — IMO0002 Reserved for concepts with insufficient information to code with codable children: Secondary | ICD-10-CM | POA: Diagnosis not present

## 2014-03-19 DIAGNOSIS — I10 Essential (primary) hypertension: Secondary | ICD-10-CM | POA: Diagnosis not present

## 2014-03-25 ENCOUNTER — Ambulatory Visit: Payer: Medicare Other | Admitting: Cardiology

## 2014-03-26 ENCOUNTER — Ambulatory Visit (INDEPENDENT_AMBULATORY_CARE_PROVIDER_SITE_OTHER): Payer: Medicare Other | Admitting: Cardiology

## 2014-03-26 ENCOUNTER — Encounter: Payer: Self-pay | Admitting: Cardiology

## 2014-03-26 VITALS — BP 144/78 | HR 52 | Ht 63.0 in | Wt 125.0 lb

## 2014-03-26 DIAGNOSIS — E7800 Pure hypercholesterolemia, unspecified: Secondary | ICD-10-CM | POA: Diagnosis not present

## 2014-03-26 DIAGNOSIS — I10 Essential (primary) hypertension: Secondary | ICD-10-CM

## 2014-03-26 DIAGNOSIS — I2510 Atherosclerotic heart disease of native coronary artery without angina pectoris: Secondary | ICD-10-CM | POA: Diagnosis not present

## 2014-03-26 DIAGNOSIS — I359 Nonrheumatic aortic valve disorder, unspecified: Secondary | ICD-10-CM | POA: Diagnosis not present

## 2014-03-26 DIAGNOSIS — I350 Nonrheumatic aortic (valve) stenosis: Secondary | ICD-10-CM

## 2014-03-26 DIAGNOSIS — R42 Dizziness and giddiness: Secondary | ICD-10-CM

## 2014-03-26 NOTE — Progress Notes (Signed)
HPI [name] is seen today for follow up.   She has a history of coronary disease in his head prior angioplasty of the diagonal branch in 1992. She had stenting of the right coronary in 2005. She has a history of hypertension. She has a history of mild aortic stenosis by Echo in 2011. She continues to experience back pain. She complains of increased symptoms of lightheadedness and weakness over the past 6 weeks. No chest pain or SOB. Records from 24 hour BP monitor demonstrate excellent BP control. Pulse runs between 50-70 bpm.  Allergies  Allergen Reactions  . Tetanus Toxoids   . Adhesive [Tape] Rash    Current Outpatient Prescriptions on File Prior to Visit  Medication Sig Dispense Refill  . aspirin 81 MG tablet Take 162 mg by mouth daily.       [name] Kitchen atorvastatin (LIPITOR) 40 MG tablet Take 40 mg by mouth daily.      . Cholecalciferol (VITAMIN D PO) Take by mouth daily.        . hydrochlorothiazide (HYDRODIURIL) 25 MG tablet Take 1 tablet (25 mg total) by mouth daily.  90 tablet  3  . levocetirizine (XYZAL) 5 MG tablet Take 5 mg by mouth as needed.       . meloxicam (MOBIC) 7.5 MG tablet Take 7.5 mg by mouth as needed for pain.   60 tablet    . metoprolol (LOPRESSOR) 50 MG tablet Take 1 tablet (50 mg total) by mouth daily.  90 tablet  3  . metoprolol succinate (TOPROL-XL) 50 MG 24 hr tablet Take 50 mg by mouth daily. Take with or immediately following a meal.      . Multiple Vitamin (MULTIVITAMIN) tablet Take 1 tablet by mouth daily.        [name] Kitchen NIFEdipine (PROCARDIA XL/ADALAT-CC) 60 MG 24 hr tablet Take 1 tablet (60 mg total) by mouth daily.  90 tablet  3  . nitroGLYCERIN (NITROSTAT) 0.4 MG SL tablet Place 1 tablet (0.4 mg total) under the tongue every 5 (five) minutes as needed for chest pain.  25 tablet  12  . NON FORMULARY hylands (for leg cramps)      . Nutritional Supplements (MELATONIN PO) Take by mouth daily as needed.        . pantoprazole (PROTONIX) 40 MG tablet Take 40 mg by mouth  daily.       No current facility-administered medications on file prior to visit.    Past Medical History  Diagnosis Date  . CAD (coronary artery disease)   . HTN (hypertension)   . Hypercholesteremia   . GERD (gastroesophageal reflux disease)   . Anxiety and depression   . Aortic stenosis, mild   . Lateral myocardial infarction 1992  . Scoliosis   . Osteoarthritis   . Diverticula of colon   . Osteopenia     Past Surgical History  Procedure Laterality Date  . Coronary angioplasty    . Appendectomy    . Tonsillectomy    . Abdominal hysterectomy      Family History  Problem Relation Age of Onset  . Hypertension Father   . Heart failure Mother   . Hypertension Mother   . Cancer Brother     History   Social History  . Marital Status: Divorced    Spouse Name: N/A    Number of Children: 1  . Years of Education: N/A   Occupational History  . retired    Social History Main Topics  . Smoking  status: Former Smoker    Types: Cigarettes    Quit date: 12/26/1991  . Smokeless tobacco: Not on file  . Alcohol Use: No  . Drug Use: No  . Sexual Activity: Not on file   Other Topics Concern  . Not on file   Social History Narrative  . No narrative on file    ROS She does complain of pain in her back. She has chronic scoliosis.   All other systems are reviewed and are negative.   PHYSICAL EXAM BP 144/78  Pulse 52  Ht 5\' 3"  (1.6 m)  Wt 125 lb (56.7 kg)  BMI 22.15 kg/m2  She is an elderly female in no acute distress. She is normocephalic atraumatic. Pupils are equal round and reactive to light and accommodation. Sclera are clear. Oropharynx is clear. Neck is supple no JVD, adenopathy, thyromegaly, or bruits. Lungs are clear. Cardiac exam reveals a harsh grade 2/6 systolic ejection murmur right upper border. This radiates into the carotids. PMI is normal. There is no S3. Abdomen is soft and nontender he has no masses or bruits. Extremities are without edema. Pedal  pulses are 2+. Neurologic exam reveals that she is alert and oriented x3. Cranial nerves II through XII are intact. She has normal motor and sensory exam. Gait is normal.  Laboratory data:    ASSESSMENT AND PLAN  1. Coronary disease with prior PCI. She is asymptomatic.  We'll continue aspirin, metoprolol, and nifedipine.  2. Aortic stenosis, mild by prior echocardiogram in September 2011. Given recent symptoms we will update Echo.   3. Hypertension, improved control with the addition of HCTZ.  4. Hypercholesterolemia, controlled on atorvastatin.  5. Dizziness, weakness. Cause is unclear. We will reduce metoprolol to 25 mg daily and see if this helps. She is scheduled for complete physical with labs in May with Dr. Brigitte Pulse.

## 2014-03-26 NOTE — Patient Instructions (Signed)
Reduce metoprolol to 25 mg daily  We will schedule you for an Echocardiogram

## 2014-03-31 ENCOUNTER — Telehealth: Payer: Self-pay | Admitting: Cardiology

## 2014-03-31 NOTE — Telephone Encounter (Signed)
Returned call to patient no answer.LMTC. 

## 2014-03-31 NOTE — Telephone Encounter (Signed)
New Message:  Pt is calling with questions about her Metoprolol and is requesting a call back from [facility]

## 2014-04-01 NOTE — Telephone Encounter (Signed)
Returned call to patient she wanted to make sure of metoprolol dose.Advised at 03/26/14 office visit Dr.Jordan reduced metoprolol succ.to 25 mg daily.Advised to call back if needed.

## 2014-04-01 NOTE — Telephone Encounter (Signed)
Patient is returning your call, please call her back at [phone]

## 2014-04-08 ENCOUNTER — Encounter: Payer: Self-pay | Admitting: Internal Medicine

## 2014-04-08 ENCOUNTER — Ambulatory Visit (HOSPITAL_COMMUNITY): Payer: Medicare Other | Attending: Internal Medicine | Admitting: Radiology

## 2014-04-08 DIAGNOSIS — I2510 Atherosclerotic heart disease of native coronary artery without angina pectoris: Secondary | ICD-10-CM | POA: Diagnosis not present

## 2014-04-08 DIAGNOSIS — I359 Nonrheumatic aortic valve disorder, unspecified: Secondary | ICD-10-CM

## 2014-04-08 DIAGNOSIS — Z87891 Personal history of nicotine dependence: Secondary | ICD-10-CM | POA: Diagnosis not present

## 2014-04-08 DIAGNOSIS — E785 Hyperlipidemia, unspecified: Secondary | ICD-10-CM | POA: Diagnosis not present

## 2014-04-08 DIAGNOSIS — I10 Essential (primary) hypertension: Secondary | ICD-10-CM

## 2014-04-08 DIAGNOSIS — R42 Dizziness and giddiness: Secondary | ICD-10-CM | POA: Diagnosis not present

## 2014-04-08 DIAGNOSIS — R011 Cardiac murmur, unspecified: Secondary | ICD-10-CM | POA: Insufficient documentation

## 2014-04-08 DIAGNOSIS — I080 Rheumatic disorders of both mitral and aortic valves: Secondary | ICD-10-CM | POA: Diagnosis not present

## 2014-04-08 DIAGNOSIS — I379 Nonrheumatic pulmonary valve disorder, unspecified: Secondary | ICD-10-CM | POA: Diagnosis not present

## 2014-04-08 DIAGNOSIS — I079 Rheumatic tricuspid valve disease, unspecified: Secondary | ICD-10-CM | POA: Diagnosis not present

## 2014-04-08 DIAGNOSIS — E7800 Pure hypercholesterolemia, unspecified: Secondary | ICD-10-CM

## 2014-04-08 DIAGNOSIS — I350 Nonrheumatic aortic (valve) stenosis: Secondary | ICD-10-CM

## 2014-04-08 NOTE — Progress Notes (Signed)
Echocardiogram performed.  

## 2014-04-28 ENCOUNTER — Other Ambulatory Visit: Payer: Self-pay | Admitting: Dermatology

## 2014-04-28 ENCOUNTER — Encounter: Payer: Self-pay | Admitting: Gastroenterology

## 2014-04-28 DIAGNOSIS — D044 Carcinoma in situ of skin of scalp and neck: Secondary | ICD-10-CM | POA: Diagnosis not present

## 2014-04-28 DIAGNOSIS — L570 Actinic keratosis: Secondary | ICD-10-CM | POA: Diagnosis not present

## 2014-04-28 DIAGNOSIS — D1801 Hemangioma of skin and subcutaneous tissue: Secondary | ICD-10-CM | POA: Diagnosis not present

## 2014-04-28 DIAGNOSIS — L821 Other seborrheic keratosis: Secondary | ICD-10-CM | POA: Diagnosis not present

## 2014-04-28 DIAGNOSIS — D485 Neoplasm of uncertain behavior of skin: Secondary | ICD-10-CM | POA: Diagnosis not present

## 2014-04-28 DIAGNOSIS — L820 Inflamed seborrheic keratosis: Secondary | ICD-10-CM | POA: Diagnosis not present

## 2014-04-28 DIAGNOSIS — D1739 Benign lipomatous neoplasm of skin and subcutaneous tissue of other sites: Secondary | ICD-10-CM | POA: Diagnosis not present

## 2014-04-28 DIAGNOSIS — Z85828 Personal history of other malignant neoplasm of skin: Secondary | ICD-10-CM | POA: Diagnosis not present

## 2014-05-29 ENCOUNTER — Other Ambulatory Visit: Payer: Self-pay | Admitting: Cardiology

## 2014-07-17 DIAGNOSIS — E785 Hyperlipidemia, unspecified: Secondary | ICD-10-CM | POA: Diagnosis not present

## 2014-07-17 DIAGNOSIS — I10 Essential (primary) hypertension: Secondary | ICD-10-CM | POA: Diagnosis not present

## 2014-07-17 DIAGNOSIS — M810 Age-related osteoporosis without current pathological fracture: Secondary | ICD-10-CM | POA: Diagnosis not present

## 2014-07-24 DIAGNOSIS — Z0000 Encounter for general adult medical examination without abnormal findings: Secondary | ICD-10-CM | POA: Diagnosis not present

## 2014-07-24 DIAGNOSIS — M810 Age-related osteoporosis without current pathological fracture: Secondary | ICD-10-CM | POA: Diagnosis not present

## 2014-07-24 DIAGNOSIS — M4800 Spinal stenosis, site unspecified: Secondary | ICD-10-CM | POA: Diagnosis not present

## 2014-07-24 DIAGNOSIS — IMO0002 Reserved for concepts with insufficient information to code with codable children: Secondary | ICD-10-CM | POA: Diagnosis not present

## 2014-07-24 DIAGNOSIS — F341 Dysthymic disorder: Secondary | ICD-10-CM | POA: Diagnosis not present

## 2014-07-24 DIAGNOSIS — E785 Hyperlipidemia, unspecified: Secondary | ICD-10-CM | POA: Diagnosis not present

## 2014-07-24 DIAGNOSIS — I259 Chronic ischemic heart disease, unspecified: Secondary | ICD-10-CM | POA: Diagnosis not present

## 2014-07-24 DIAGNOSIS — I10 Essential (primary) hypertension: Secondary | ICD-10-CM | POA: Diagnosis not present

## 2014-07-27 DIAGNOSIS — Z1212 Encounter for screening for malignant neoplasm of rectum: Secondary | ICD-10-CM | POA: Diagnosis not present

## 2014-08-10 DIAGNOSIS — M48061 Spinal stenosis, lumbar region without neurogenic claudication: Secondary | ICD-10-CM | POA: Diagnosis not present

## 2014-08-20 ENCOUNTER — Encounter: Payer: Self-pay | Admitting: Gastroenterology

## 2014-08-21 DIAGNOSIS — M5137 Other intervertebral disc degeneration, lumbosacral region: Secondary | ICD-10-CM | POA: Diagnosis not present

## 2014-08-21 DIAGNOSIS — M48061 Spinal stenosis, lumbar region without neurogenic claudication: Secondary | ICD-10-CM | POA: Diagnosis not present

## 2014-08-26 ENCOUNTER — Ambulatory Visit (INDEPENDENT_AMBULATORY_CARE_PROVIDER_SITE_OTHER): Payer: Medicare Other | Admitting: Cardiology

## 2014-08-26 ENCOUNTER — Encounter: Payer: Self-pay | Admitting: Cardiology

## 2014-08-26 VITALS — BP 124/72 | HR 52 | Ht 63.0 in | Wt 129.0 lb

## 2014-08-26 DIAGNOSIS — I10 Essential (primary) hypertension: Secondary | ICD-10-CM | POA: Diagnosis not present

## 2014-08-26 DIAGNOSIS — E7800 Pure hypercholesterolemia, unspecified: Secondary | ICD-10-CM

## 2014-08-26 DIAGNOSIS — I2510 Atherosclerotic heart disease of native coronary artery without angina pectoris: Secondary | ICD-10-CM | POA: Diagnosis not present

## 2014-08-26 MED ORDER — MELOXICAM 7.5 MG PO TABS: 7.5000 mg | ORAL_TABLET | Freq: Every day | ORAL | Status: DC

## 2014-08-26 NOTE — Patient Instructions (Signed)
Continue your current therapy  I will see you in 6 months.   

## 2014-08-26 NOTE — Progress Notes (Signed)
HPI [name] is seen today for follow up.   She has a history of coronary disease and is s/p prior angioplasty of the diagonal branch in 1992. She had stenting of the right coronary in 2005. She has a history of hypertension. She has a history of mild aortic stenosis by Echo in 2011. She continues to complain of  back pain related to scoliosis.  No chest pain or SOB. She had an MRI of the spine last week and is seeing Dr. Ronnald Ramp.  Allergies  Allergen Reactions  . Tetanus Toxoids   . Adhesive [Tape] Rash    Current Outpatient Prescriptions on File Prior to Visit  Medication Sig Dispense Refill  . aspirin 81 MG tablet Take 162 mg by mouth daily.       [name] Kitchen atorvastatin (LIPITOR) 40 MG tablet Take 40 mg by mouth daily.      . Cholecalciferol (VITAMIN D PO) Take by mouth daily.        . hydrochlorothiazide (HYDRODIURIL) 25 MG tablet TAKE 1 TABLET DAILY  90 tablet  1  . levocetirizine (XYZAL) 5 MG tablet Take 5 mg by mouth as needed.       . metoprolol succinate (TOPROL-XL) 50 MG 24 hr tablet Take 50 mg by mouth daily. Take with or immediately following a meal.      . NIFEdipine (PROCARDIA XL/ADALAT-CC) 60 MG 24 hr tablet Take 1 tablet (60 mg total) by mouth daily.  90 tablet  3  . nitroGLYCERIN (NITROSTAT) 0.4 MG SL tablet Place 1 tablet (0.4 mg total) under the tongue every 5 (five) minutes as needed for chest pain.  25 tablet  12  . NON FORMULARY hylands (for leg cramps)      . Nutritional Supplements (MELATONIN PO) Take by mouth daily as needed.        . pantoprazole (PROTONIX) 40 MG tablet Take 40 mg by mouth daily.       No current facility-administered medications on file prior to visit.    Past Medical History  Diagnosis Date  . CAD (coronary artery disease)   . HTN (hypertension)   . Hypercholesteremia   . GERD (gastroesophageal reflux disease)   . Anxiety and depression   . Aortic stenosis, mild   . Lateral myocardial infarction 1992  . Scoliosis   . Osteoarthritis   .  Diverticula of colon   . Osteopenia     Past Surgical History  Procedure Laterality Date  . Coronary angioplasty    . Appendectomy    . Tonsillectomy    . Abdominal hysterectomy      Family History  Problem Relation Age of Onset  . Hypertension Father   . Heart failure Mother   . Hypertension Mother   . Cancer Brother     History   Social History  . Marital Status: Divorced    Spouse Name: N/A    Number of Children: 1  . Years of Education: N/A   Occupational History  . retired    Social History Main Topics  . Smoking status: Former Smoker    Types: Cigarettes    Quit date: 12/26/1991  . Smokeless tobacco: Not on file  . Alcohol Use: No  . Drug Use: No  . Sexual Activity: Not on file   Other Topics Concern  . Not on file   Social History Narrative  . No narrative on file    ROS She does complain of pain in her back. She has chronic scoliosis.  All other systems are reviewed and are negative.   PHYSICAL EXAM BP 124/72  Pulse 52  Ht 5\' 3"  (1.6 m)  Wt 129 lb (58.514 kg)  BMI 22.86 kg/m2  She is an elderly female in no acute distress. She is normocephalic atraumatic. Pupils are equal round and reactive to light and accommodation. Sclera are clear. Oropharynx is clear. Neck is supple no JVD, adenopathy, thyromegaly, or bruits. Lungs are clear. Cardiac exam reveals a harsh grade 2/6 systolic ejection murmur right upper border. This radiates into the carotids. PMI is normal. There is no S3. Abdomen is soft and nontender he has no masses or bruits. Extremities are without edema. Pedal pulses are 2+. Neurologic exam reveals that she is alert and oriented x3. Cranial nerves II through XII are intact. She has normal motor and sensory exam. Gait is normal.  Laboratory data:  Echo:Study Conclusions  - Left ventricle: The cavity size was normal. Wall thickness was increased in a pattern of mild LVH. Systolic function was normal. The estimated ejection fraction was in  the range of 60% to 65%. Wall motion was normal; there were no regional wall motion abnormalities. Doppler parameters are consistent with abnormal left ventricular relaxation (grade 1 diastolic dysfunction). - Aortic valve: Trileaflet; moderately calcified leaflets. Mild aortic stenosis by valve area and mean gradient. Visually appeared more moderately stenosed. Mean gradient: 105mm Hg (S). Peak gradient: 19mm Hg (S). Valve area: 1.59cm^2(VTI). - Mitral valve: Mildly calcified annulus. Mildly calcified leaflets . Trivial regurgitation. - Left atrium: The atrium was mildly dilated. - Right ventricle: The cavity size was normal. Systolic function was normal. - Right atrium: The atrium was mildly dilated. - Tricuspid valve: Peak RV-RA gradient: 1mm Hg (S). - Pulmonary arteries: PA peak pressure: 82mm Hg (S). - Inferior vena cava: The vessel was normal in size; the respirophasic diameter changes were in the normal range (= 50%); findings are consistent with normal central venous pressure. Impressions:  - Normal LV size with mild LV hypertrophy. EF 60-65%. Normal RV size and systolic function. There was mild aortic stenosis by valve area/mean gradient though valve appeared more moderately stenosed visually. Mild pulmonary hypertension.    ASSESSMENT AND PLAN  1. Coronary disease with prior PCI. She is asymptomatic.  We'll continue aspirin, metoprolol, and nifedipine.  2. Aortic stenosis, mild by Echo in April.  3. Hypertension, well controlled.  4. Hypercholesterolemia, controlled on atorvastatin.  5. Back pain/scoliosis. Follow up with Dr. Ronnald Ramp. meloxicam renewed.

## 2014-09-04 DIAGNOSIS — Z23 Encounter for immunization: Secondary | ICD-10-CM | POA: Diagnosis not present

## 2014-09-21 DIAGNOSIS — Q762 Congenital spondylolisthesis: Secondary | ICD-10-CM | POA: Diagnosis not present

## 2014-09-21 DIAGNOSIS — I10 Essential (primary) hypertension: Secondary | ICD-10-CM | POA: Diagnosis not present

## 2014-09-21 DIAGNOSIS — M4120 Other idiopathic scoliosis, site unspecified: Secondary | ICD-10-CM | POA: Diagnosis not present

## 2014-10-08 DIAGNOSIS — Z803 Family history of malignant neoplasm of breast: Secondary | ICD-10-CM | POA: Diagnosis not present

## 2014-10-08 DIAGNOSIS — Z1231 Encounter for screening mammogram for malignant neoplasm of breast: Secondary | ICD-10-CM | POA: Diagnosis not present

## 2014-10-08 DIAGNOSIS — M810 Age-related osteoporosis without current pathological fracture: Secondary | ICD-10-CM | POA: Diagnosis not present

## 2014-10-26 ENCOUNTER — Telehealth: Payer: Self-pay | Admitting: Cardiology

## 2014-10-26 NOTE — Telephone Encounter (Signed)
[name] is calling because her prescription has expired for her Nitro Stat, and she is changing your pharmacy to [facility] [employer] .[name] Kitchen .. [phone]  Thanks

## 2014-10-29 ENCOUNTER — Telehealth: Payer: Self-pay | Admitting: Cardiology

## 2014-10-29 MED ORDER — NITROGLYCERIN 0.4 MG SL SUBL: 0.4000 mg | SUBLINGUAL_TABLET | SUBLINGUAL | Status: DC | PRN

## 2014-10-29 NOTE — Telephone Encounter (Signed)
Returned call to patient she stated she needed a refill for NTG.Refill sent to pharmacy.

## 2014-10-29 NOTE — Telephone Encounter (Signed)
Pt called in stating that she needs a new prescription for NTG called in to the [facility] on [address] in [facility]. She would also like for [facility] to call her.  Thanks

## 2014-11-02 NOTE — Telephone Encounter (Signed)
Returned call to patient NTG refill already sent to pharmacy 10/29/14.

## 2014-11-17 ENCOUNTER — Other Ambulatory Visit: Payer: Self-pay

## 2014-11-17 MED ORDER — NIFEDIPINE ER OSMOTIC RELEASE 60 MG PO TB24: 60.0000 mg | ORAL_TABLET | Freq: Every day | ORAL | Status: DC

## 2015-02-01 ENCOUNTER — Other Ambulatory Visit: Payer: Self-pay | Admitting: Cardiology

## 2015-02-01 MED ORDER — HYDROCHLOROTHIAZIDE 25 MG PO TABS: 25.0000 mg | ORAL_TABLET | Freq: Every day | ORAL | Status: DC

## 2015-02-01 NOTE — Telephone Encounter (Signed)
°  1. Which medications need to be refilled? HCTZ 25 mg   2. Which pharmacy is medication to be sent to?[facility] on [address] in [place], [place]  3. Do they need a 30 day or 90 day supply? 30  4. Would they like a call back once the medication has been sent to the pharmacy? NO

## 2015-02-01 NOTE — Telephone Encounter (Signed)
Rx(s) sent to pharmacy electronically.  

## 2015-02-11 ENCOUNTER — Encounter: Payer: Self-pay | Admitting: Cardiology

## 2015-02-11 ENCOUNTER — Ambulatory Visit (INDEPENDENT_AMBULATORY_CARE_PROVIDER_SITE_OTHER): Payer: Medicare Other | Admitting: Cardiology

## 2015-02-11 VITALS — BP 160/82 | HR 51 | Ht 63.0 in | Wt 133.3 lb

## 2015-02-11 DIAGNOSIS — E7800 Pure hypercholesterolemia, unspecified: Secondary | ICD-10-CM

## 2015-02-11 DIAGNOSIS — I350 Nonrheumatic aortic (valve) stenosis: Secondary | ICD-10-CM

## 2015-02-11 DIAGNOSIS — I2510 Atherosclerotic heart disease of native coronary artery without angina pectoris: Secondary | ICD-10-CM | POA: Diagnosis not present

## 2015-02-11 DIAGNOSIS — I10 Essential (primary) hypertension: Secondary | ICD-10-CM | POA: Diagnosis not present

## 2015-02-11 NOTE — Progress Notes (Signed)
HPI [name] is seen today for follow up CAD.   She has a history of coronary disease and is s/p prior angioplasty of the diagonal branch in 1992. She had stenting of the right coronary in 2005. She has a history of hypertension. She has a history of mild-moderate aortic stenosis by Echo in 2015. She continues to complain of  back pain related to scoliosis.  No chest pain or SOB. BP is quite variable at home with some low readings at times.   Allergies  Allergen Reactions  . Tetanus Toxoids   . Adhesive [Tape] Rash    Current Outpatient Prescriptions on File Prior to Visit  Medication Sig Dispense Refill  . aspirin 81 MG tablet Take 162 mg by mouth daily.     [name] Kitchen atorvastatin (LIPITOR) 40 MG tablet Take 40 mg by mouth daily.    . Cholecalciferol (VITAMIN D PO) Take by mouth daily.      . hydrochlorothiazide (HYDRODIURIL) 25 MG tablet Take 1 tablet (25 mg total) by mouth daily. 30 tablet 7  . levocetirizine (XYZAL) 5 MG tablet Take 5 mg by mouth as needed.     . meloxicam (MOBIC) 7.5 MG tablet Take 1 tablet (7.5 mg total) by mouth daily. 90 tablet 11  . metoprolol succinate (TOPROL-XL) 50 MG 24 hr tablet Take 50 mg by mouth daily. Take with or immediately following a meal.    . NIFEdipine (PROCARDIA XL/ADALAT-CC) 60 MG 24 hr tablet Take 1 tablet (60 mg total) by mouth daily. 90 tablet 3  . nitroGLYCERIN (NITROSTAT) 0.4 MG SL tablet Place 1 tablet (0.4 mg total) under the tongue every 5 (five) minutes as needed for chest pain. 25 tablet 12  . NON FORMULARY hylands (for leg cramps)    . Nutritional Supplements (MELATONIN PO) Take by mouth daily as needed.      . pantoprazole (PROTONIX) 40 MG tablet Take 40 mg by mouth daily.     No current facility-administered medications on file prior to visit.    Past Medical History  Diagnosis Date  . CAD (coronary artery disease)   . HTN (hypertension)   . Hypercholesteremia   . GERD (gastroesophageal reflux disease)   . Anxiety and depression   .  Aortic stenosis, mild   . Lateral myocardial infarction 1992  . Scoliosis   . Osteoarthritis   . Diverticula of colon   . Osteopenia     Past Surgical History  Procedure Laterality Date  . Coronary angioplasty    . Appendectomy    . Tonsillectomy    . Abdominal hysterectomy      Family History  Problem Relation Age of Onset  . Hypertension Father   . Heart failure Mother   . Hypertension Mother   . Cancer Brother      History   Social History  . Marital Status: Divorced    Spouse Name: N/A  . Number of Children: 1  . Years of Education: N/A   Occupational History  . retired    Social History Main Topics  . Smoking status: Former Smoker    Types: Cigarettes    Quit date: 12/26/1991  . Smokeless tobacco: Not on file  . Alcohol Use: No  . Drug Use: No  . Sexual Activity: Not on file   Other Topics Concern  . Not on file   Social History Narrative    ROS She does complain of pain in her back. She has chronic scoliosis.   All other  systems are reviewed and are negative.   PHYSICAL EXAM BP 160/82 mmHg  Pulse 51  Ht 5\' 3"  (1.6 m)  Wt 133 lb 5 oz (60.47 kg)  BMI 23.62 kg/m2  She is an elderly female in no acute distress. HEENT is unremarkable.  Neck is supple no JVD, adenopathy, thyromegaly, or bruits. Lungs are clear. Cardiac exam reveals a harsh grade 2/6 systolic ejection murmur right upper border. This radiates into the carotids. PMI is normal. There is no S3. Abdomen is soft and nontender he has no masses or bruits. Extremities are without edema. Pedal pulses are 2+. Neurologic exam reveals that she is alert and oriented x3. Cranial nerves II through XII are intact. She has normal motor and sensory exam. Gait is normal.  Laboratory data:  Ecg today shows NSR with RBBB. No change. I have personally reviewed and interpreted this study.    ASSESSMENT AND PLAN  1. Coronary disease with remote PCI. She is asymptomatic.  We'll continue aspirin, metoprolol,  and nifedipine.  2. Aortic stenosis, mild-moderate by Echo in 2015  3. Hypertensio  4. Hypercholesterolemia, controlled on atorvastatin.  5. Back pain/scoliosis. Not amenable to surgical therapy.

## 2015-02-11 NOTE — Patient Instructions (Signed)
Continue your current therapy  I will see you in 6 months.   

## 2015-04-12 DIAGNOSIS — H2513 Age-related nuclear cataract, bilateral: Secondary | ICD-10-CM | POA: Diagnosis not present

## 2015-04-12 DIAGNOSIS — H25011 Cortical age-related cataract, right eye: Secondary | ICD-10-CM | POA: Diagnosis not present

## 2015-04-12 DIAGNOSIS — H1859 Other hereditary corneal dystrophies: Secondary | ICD-10-CM | POA: Diagnosis not present

## 2015-04-12 DIAGNOSIS — H43813 Vitreous degeneration, bilateral: Secondary | ICD-10-CM | POA: Diagnosis not present

## 2015-05-05 DIAGNOSIS — H25011 Cortical age-related cataract, right eye: Secondary | ICD-10-CM | POA: Diagnosis not present

## 2015-05-05 DIAGNOSIS — H2511 Age-related nuclear cataract, right eye: Secondary | ICD-10-CM | POA: Diagnosis not present

## 2015-05-05 DIAGNOSIS — H25811 Combined forms of age-related cataract, right eye: Secondary | ICD-10-CM | POA: Diagnosis not present

## 2015-07-20 ENCOUNTER — Telehealth: Payer: Self-pay | Admitting: Cardiology

## 2015-07-20 MED ORDER — HYDROCHLOROTHIAZIDE 25 MG PO TABS: 25.0000 mg | ORAL_TABLET | Freq: Every day | ORAL | Status: DC

## 2015-07-20 NOTE — Telephone Encounter (Signed)
Pt called as she needed her Rx updated to [facility] for 90 day supply instead of 30 day supply. Pt Rx has been updated and sent to preferred pharmacy in [place]. Pt aware, no additional questions at this time.

## 2015-07-20 NOTE — Telephone Encounter (Signed)
[name] is calling because she needs to speak to [place] about a prescription . Please call    Thanks

## 2015-07-21 DIAGNOSIS — M810 Age-related osteoporosis without current pathological fracture: Secondary | ICD-10-CM | POA: Diagnosis not present

## 2015-07-21 DIAGNOSIS — E785 Hyperlipidemia, unspecified: Secondary | ICD-10-CM | POA: Diagnosis not present

## 2015-07-21 DIAGNOSIS — I10 Essential (primary) hypertension: Secondary | ICD-10-CM | POA: Diagnosis not present

## 2015-07-21 DIAGNOSIS — N390 Urinary tract infection, site not specified: Secondary | ICD-10-CM | POA: Diagnosis not present

## 2015-08-02 DIAGNOSIS — I350 Nonrheumatic aortic (valve) stenosis: Secondary | ICD-10-CM | POA: Diagnosis not present

## 2015-08-02 DIAGNOSIS — E785 Hyperlipidemia, unspecified: Secondary | ICD-10-CM | POA: Diagnosis not present

## 2015-08-02 DIAGNOSIS — M4800 Spinal stenosis, site unspecified: Secondary | ICD-10-CM | POA: Diagnosis not present

## 2015-08-02 DIAGNOSIS — Z0000 Encounter for general adult medical examination without abnormal findings: Secondary | ICD-10-CM | POA: Diagnosis not present

## 2015-08-02 DIAGNOSIS — Z9861 Coronary angioplasty status: Secondary | ICD-10-CM | POA: Diagnosis not present

## 2015-08-02 DIAGNOSIS — Z1389 Encounter for screening for other disorder: Secondary | ICD-10-CM | POA: Diagnosis not present

## 2015-08-02 DIAGNOSIS — M810 Age-related osteoporosis without current pathological fracture: Secondary | ICD-10-CM | POA: Diagnosis not present

## 2015-08-02 DIAGNOSIS — K219 Gastro-esophageal reflux disease without esophagitis: Secondary | ICD-10-CM | POA: Diagnosis not present

## 2015-08-02 DIAGNOSIS — I10 Essential (primary) hypertension: Secondary | ICD-10-CM | POA: Diagnosis not present

## 2015-08-02 DIAGNOSIS — Z6823 Body mass index (BMI) 23.0-23.9, adult: Secondary | ICD-10-CM | POA: Diagnosis not present

## 2015-08-10 DIAGNOSIS — Z1212 Encounter for screening for malignant neoplasm of rectum: Secondary | ICD-10-CM | POA: Diagnosis not present

## 2015-08-19 ENCOUNTER — Ambulatory Visit (INDEPENDENT_AMBULATORY_CARE_PROVIDER_SITE_OTHER): Payer: Medicare Other | Admitting: Cardiology

## 2015-08-19 ENCOUNTER — Encounter: Payer: Self-pay | Admitting: Cardiology

## 2015-08-19 VITALS — BP 146/73 | HR 59 | Ht 62.5 in | Wt 123.1 lb

## 2015-08-19 DIAGNOSIS — I2510 Atherosclerotic heart disease of native coronary artery without angina pectoris: Secondary | ICD-10-CM | POA: Diagnosis not present

## 2015-08-19 DIAGNOSIS — E7800 Pure hypercholesterolemia, unspecified: Secondary | ICD-10-CM

## 2015-08-19 DIAGNOSIS — I10 Essential (primary) hypertension: Secondary | ICD-10-CM | POA: Diagnosis not present

## 2015-08-19 DIAGNOSIS — I350 Nonrheumatic aortic (valve) stenosis: Secondary | ICD-10-CM | POA: Diagnosis not present

## 2015-08-19 NOTE — Patient Instructions (Signed)
Continue your current therapy  I will see you in 6 months.   

## 2015-08-19 NOTE — Progress Notes (Signed)
HPI [name] is seen today for follow up CAD.   She has a history of coronary disease and is s/p prior angioplasty of the diagonal branch in 1992. She had stenting of the right coronary in 2005. She has a history of hypertension. She has a history of mild-moderate aortic stenosis by Echo in 2015. She has chronic  back pain related to scoliosis.  No chest pain or SOB. She is planning to move to [facility] this October.  Allergies  Allergen Reactions  . Tetanus Toxoids   . Adhesive [Tape] Rash    Current Outpatient Prescriptions on File Prior to Visit  Medication Sig Dispense Refill  . aspirin 81 MG tablet Take 162 mg by mouth daily.     [name] Kitchen atorvastatin (LIPITOR) 40 MG tablet Take 40 mg by mouth daily.    [name] Kitchen FIBER FORMULA PO Take 1 tablet by mouth daily.    . hydrochlorothiazide (HYDRODIURIL) 25 MG tablet Take 1 tablet (25 mg total) by mouth daily. 90 tablet 2  . levocetirizine (XYZAL) 5 MG tablet Take 5 mg by mouth as needed.     . metoprolol succinate (TOPROL-XL) 50 MG 24 hr tablet Take 50 mg by mouth daily. Take with or immediately following a meal.    . NIFEdipine (PROCARDIA XL/ADALAT-CC) 60 MG 24 hr tablet Take 1 tablet (60 mg total) by mouth daily. 90 tablet 3  . nitroGLYCERIN (NITROSTAT) 0.4 MG SL tablet Place 1 tablet (0.4 mg total) under the tongue every 5 (five) minutes as needed for chest pain. 25 tablet 12  . NON FORMULARY hylands (for leg cramps)    . Nutritional Supplements (MELATONIN PO) Take by mouth daily as needed.      . pantoprazole (PROTONIX) 40 MG tablet Take 40 mg by mouth daily.     No current facility-administered medications on file prior to visit.    Past Medical History  Diagnosis Date  . CAD (coronary artery disease)   . HTN (hypertension)   . Hypercholesteremia   . GERD (gastroesophageal reflux disease)   . Anxiety and depression   . Aortic stenosis, mild   . Lateral myocardial infarction 1992  . Scoliosis   . Osteoarthritis   . Diverticula of  colon   . Osteopenia     Past Surgical History  Procedure Laterality Date  . Coronary angioplasty    . Appendectomy    . Tonsillectomy    . Abdominal hysterectomy      Family History  Problem Relation Age of Onset  . Hypertension Father   . Heart failure Mother   . Hypertension Mother   . Cancer Brother      Social History   Social History  . Marital Status: Divorced    Spouse Name: N/A  . Number of Children: 1  . Years of Education: N/A   Occupational History  . retired    Social History Main Topics  . Smoking status: Former Smoker    Types: Cigarettes    Quit date: 12/26/1991  . Smokeless tobacco: Not on file  . Alcohol Use: No  . Drug Use: No  . Sexual Activity: Not on file   Other Topics Concern  . Not on file   Social History Narrative    ROS She does complain of pain in her back. She has chronic scoliosis.   All other systems are reviewed and are negative.   PHYSICAL EXAM BP 146/73 mmHg  Pulse 59  Ht 5' 2.5" (1.588 m)  Wt 55.838 kg (123 lb 1.6 oz)  BMI 22.14 kg/m2  She is an elderly female in no acute distress. HEENT is unremarkable.  Neck is supple no JVD, adenopathy, thyromegaly, or bruits. Lungs are clear. Cardiac exam reveals a harsh grade 2/6 systolic ejection murmur right upper border. This radiates into the carotids. PMI is normal. There is no S3. Abdomen is soft and nontender he has no masses or bruits. Extremities are without edema. Pedal pulses are 2+. Neurologic exam reveals that she is alert and oriented x3. Cranial nerves II through XII are intact. She has normal motor and sensory exam. Gait is normal.  Laboratory data:      ASSESSMENT AND PLAN  1. Coronary disease with remote PCI. She is asymptomatic.  We'll continue aspirin, metoprolol, and nifedipine.  2. Aortic stenosis, mild-moderate by Echo in 2015  3. Hypertension controlled.  4. Hypercholesterolemia, controlled on atorvastatin. Labs followed by Dr. Brigitte Pulse  5. Back  pain/scoliosis. Not amenable to surgical therapy.  I will follow up in 6 months.

## 2015-10-11 DIAGNOSIS — Z1231 Encounter for screening mammogram for malignant neoplasm of breast: Secondary | ICD-10-CM | POA: Diagnosis not present

## 2016-03-15 ENCOUNTER — Ambulatory Visit: Payer: No Typology Code available for payment source | Admitting: Cardiology

## 2016-03-15 ENCOUNTER — Encounter: Payer: Self-pay | Admitting: *Deleted

## 2016-03-31 ENCOUNTER — Encounter: Payer: Self-pay | Admitting: *Deleted

## 2016-04-04 ENCOUNTER — Ambulatory Visit (INDEPENDENT_AMBULATORY_CARE_PROVIDER_SITE_OTHER): Payer: Medicare Other | Admitting: Cardiology

## 2016-04-04 ENCOUNTER — Other Ambulatory Visit: Payer: Self-pay

## 2016-04-04 ENCOUNTER — Encounter: Payer: Self-pay | Admitting: Cardiology

## 2016-04-04 VITALS — BP 160/84 | HR 53 | Ht 63.0 in | Wt 123.2 lb

## 2016-04-04 DIAGNOSIS — I350 Nonrheumatic aortic (valve) stenosis: Secondary | ICD-10-CM

## 2016-04-04 DIAGNOSIS — M419 Scoliosis, unspecified: Secondary | ICD-10-CM

## 2016-04-04 DIAGNOSIS — I2510 Atherosclerotic heart disease of native coronary artery without angina pectoris: Secondary | ICD-10-CM

## 2016-04-04 DIAGNOSIS — I10 Essential (primary) hypertension: Secondary | ICD-10-CM | POA: Diagnosis not present

## 2016-04-04 DIAGNOSIS — E7800 Pure hypercholesterolemia, unspecified: Secondary | ICD-10-CM | POA: Diagnosis not present

## 2016-04-04 MED ORDER — NITROGLYCERIN 0.4 MG SL SUBL: 0.4000 mg | SUBLINGUAL_TABLET | SUBLINGUAL | Status: DC | PRN

## 2016-04-04 MED ORDER — METOPROLOL SUCCINATE ER 50 MG PO TB24: 50.0000 mg | ORAL_TABLET | Freq: Every day | ORAL | Status: DC

## 2016-04-04 MED ORDER — ATORVASTATIN CALCIUM 40 MG PO TABS: 40.0000 mg | ORAL_TABLET | Freq: Every day | ORAL | Status: DC

## 2016-04-04 MED ORDER — NIFEDIPINE ER OSMOTIC RELEASE 60 MG PO TB24: 60.0000 mg | ORAL_TABLET | Freq: Every day | ORAL | Status: DC

## 2016-04-04 NOTE — Progress Notes (Signed)
HPI [name] is seen today for follow up CAD.   She has a history of coronary disease and is s/p prior angioplasty of the diagonal branch in 1992. She had stenting of the right coronary in 2005. She has a history of hypertension. She has a history of mild-moderate aortic stenosis by Echo in 2015. She has chronic  back pain related to scoliosis.  She is now living at [facility]. She is able to walk back and forth to dinner but with her back has to stop multiple times. She has seen [name] but there is really no surgical option. She does have multiple stressors and feels "nervy"  Allergies  Allergen Reactions  . Nifedipine Other (See Comments)    Dark Urine; can only take orange tablet, allergic to yellow-brown tablet  . Tetanus Toxoids   . Adhesive [Tape] Rash    Current Outpatient Prescriptions on File Prior to Visit  Medication Sig Dispense Refill  . aspirin 81 MG tablet Take 162 mg by mouth daily.     [name] Kitchen atorvastatin (LIPITOR) 40 MG tablet Take 40 mg by mouth daily.    . cholecalciferol (VITAMIN D) 1000 UNITS tablet Take 1,000 Units by mouth 2 (two) times daily.    [name] Kitchen FIBER FORMULA PO Take 1 tablet by mouth daily.    . hydrochlorothiazide (HYDRODIURIL) 25 MG tablet Take 1 tablet (25 mg total) by mouth daily. 90 tablet 2  . levocetirizine (XYZAL) 5 MG tablet Take 5 mg by mouth as needed.     . Melatonin 1 MG CAPS Take 1 capsule by mouth daily.    . meloxicam (MOBIC) 15 MG tablet Take 15 mg by mouth daily.    . metoprolol succinate (TOPROL-XL) 50 MG 24 hr tablet Take 50 mg by mouth daily. Take with or immediately following a meal.    . NIFEdipine (PROCARDIA XL/ADALAT-CC) 60 MG 24 hr tablet Take 1 tablet (60 mg total) by mouth daily. 90 tablet 3  . nitroGLYCERIN (NITROSTAT) 0.4 MG SL tablet Place 1 tablet (0.4 mg total) under the tongue every 5 (five) minutes as needed for chest pain. 25 tablet 12  . NON FORMULARY hylands (for leg cramps)    . Nutritional Supplements  (MELATONIN PO) Take by mouth daily as needed.      . pantoprazole (PROTONIX) 40 MG tablet Take 40 mg by mouth daily.     No current facility-administered medications on file prior to visit.    Past Medical History  Diagnosis Date  . CAD (coronary artery disease)   . HTN (hypertension)   . Hypercholesteremia   . GERD (gastroesophageal reflux disease)   . Anxiety and depression   . Aortic stenosis, mild   . Lateral myocardial infarction ([place]) 1992  . Scoliosis   . Osteoarthritis   . Diverticula of colon   . Osteopenia     Past Surgical History  Procedure Laterality Date  . Coronary angioplasty    . Appendectomy    . Tonsillectomy    . Abdominal hysterectomy      Family History  Problem Relation Age of Onset  . Hypertension Father   . Heart failure Mother   . Hypertension Mother   . Cancer Brother      Social History   Social History  . Marital Status: Divorced    Spouse Name: N/A  . Number of Children: 1  . Years of Education: N/A   Occupational History  . retired    Social History  Main Topics  . Smoking status: Former Smoker    Types: Cigarettes    Quit date: 12/26/1991  . Smokeless tobacco: Not on file  . Alcohol Use: No  . Drug Use: No  . Sexual Activity: Not on file   Other Topics Concern  . Not on file   Social History Narrative    ROS She does complain of pain in her back. She has chronic scoliosis.   All other systems are reviewed and are negative.   PHYSICAL EXAM BP 160/84 mmHg  Pulse 53  Ht 5\' 3"  (1.6 m)  Wt 55.883 kg (123 lb 3.2 oz)  BMI 21.83 kg/m2  She is an elderly female in no acute distress. HEENT is unremarkable.  Neck is supple no JVD, adenopathy, thyromegaly, or bruits. Lungs are clear. Cardiac exam reveals a harsh grade 2/6 systolic ejection murmur right upper border. This radiates into the carotids. PMI is normal. There is no S3. Abdomen is soft and nontender he has no masses or bruits. Extremities are without edema. Pedal  pulses are 2+. Neurologic exam reveals that she is alert and oriented x3. Cranial nerves II through XII are intact. She has normal motor and sensory exam. Gait is normal.  Laboratory data:  Ecg today shows sinus brady with rate 53. RBBB. I have personally reviewed and interpreted this study.   ASSESSMENT AND PLAN  1. Coronary disease with remote PCI. She is asymptomatic.  We'll continue aspirin, metoprolol, and nifedipine.   2. Aortic stenosis, mild-moderate by Echo in 2015. No change in exam.  3. Hypertension BP fluctuates a lot. Will continue current regimen.  4. Hypercholesterolemia, controlled on atorvastatin.  5. Severe scoliosis with chronic back pain. Rx for rolling walker.   6. Chronic RBBB   I will follow up in 6 months.

## 2016-04-04 NOTE — Patient Instructions (Signed)
Continue your current therapy  I will see you in 6 months.   

## 2016-04-05 ENCOUNTER — Telehealth: Payer: Self-pay | Admitting: *Deleted

## 2016-04-05 NOTE — Telephone Encounter (Signed)
Pt came in for walk-in request.  She needed further information for a DME order written yesterday at visit. I added relevant dx codes to order as well as physician NPI and sent to [facility], per agency request, so that they can fill order for 4 pt walker.  Patient aware. Informed her I would keep copy on-hand in case initial fax transmission fails. Advised to call us if new requests.

## 2016-04-10 ENCOUNTER — Telehealth: Payer: Self-pay | Admitting: Cardiology

## 2016-04-10 NOTE — Telephone Encounter (Signed)
Pt is calling in wanting to speak with Malachy Mood about a fax she is going to receive in regards to a medication that was called in incorrectly. The pharmacy is needing clarification. Please f/u with her  Thanks

## 2016-04-11 NOTE — Telephone Encounter (Signed)
Returned call to patient 04/10/16.She stated she thought one of her prescriptions was sent in wrong.Medications reviewed and refills sent to pharmacy correctly.Patient stated she will call her mail order pharmacy.Advised to call me back if needed.

## 2016-04-12 ENCOUNTER — Telehealth: Payer: Self-pay | Admitting: Cardiology

## 2016-04-12 MED ORDER — NIFEDIPINE ER 60 MG PO TB24: 60.0000 mg | ORAL_TABLET | Freq: Every day | ORAL | Status: DC

## 2016-04-12 NOTE — Addendum Note (Signed)
Addended by: Vanessa Ralphs on: 04/12/2016 02:25 PM   Modules accepted: Orders

## 2016-04-12 NOTE — Telephone Encounter (Signed)
She can take Adalat 60 mg daily instead of Procardia  [name] MD, [facility] [place]

## 2016-04-12 NOTE — Telephone Encounter (Signed)
Pt was calling saying the pharmacy cannot get her procardia from the manufacturer right now because it is out of stock. They said they can substitute Adalat.  Please advise medication and dosage if able to be changed. Pt would like a call back to let her know when resolved. She will need 90 day supply.  Will route to Dr Martinique and Jordan Likes.

## 2016-04-12 NOTE — Telephone Encounter (Signed)
New message       *STAT* If patient is at the pharmacy, call can be transferred to refill team.   1. Which medications need to be refilled? (please list name of each medication and dose if known) procardia 2. Which pharmacy/location (including street and city if local pharmacy) is medication to be sent to? [facility] 3. Do they need a 30 day or 90 day supply? 90 Can they substitute for adalet?

## 2016-04-12 NOTE — Telephone Encounter (Signed)
Returned call to pt that Dr Martinique said she could take adalat 60mg  by mouth daily. Pt verbalized understanding and rx sent to verified pharmacy.

## 2016-05-08 ENCOUNTER — Other Ambulatory Visit: Payer: Self-pay | Admitting: Cardiology

## 2016-05-08 MED ORDER — HYDROCHLOROTHIAZIDE 25 MG PO TABS: 25.0000 mg | ORAL_TABLET | Freq: Every day | ORAL | Status: DC

## 2016-05-08 NOTE — Telephone Encounter (Signed)
Rx request sent to pharmacy.  

## 2016-06-05 DIAGNOSIS — H5202 Hypermetropia, left eye: Secondary | ICD-10-CM | POA: Diagnosis not present

## 2016-06-05 DIAGNOSIS — H2512 Age-related nuclear cataract, left eye: Secondary | ICD-10-CM | POA: Diagnosis not present

## 2016-06-05 DIAGNOSIS — H43813 Vitreous degeneration, bilateral: Secondary | ICD-10-CM | POA: Diagnosis not present

## 2016-06-05 DIAGNOSIS — H25012 Cortical age-related cataract, left eye: Secondary | ICD-10-CM | POA: Diagnosis not present

## 2016-06-19 ENCOUNTER — Telehealth: Payer: Self-pay | Admitting: Cardiology

## 2016-06-19 NOTE — Telephone Encounter (Signed)
I would not switch lipitor to simvastatin. Simvastatin has more drug interactions- especially the ones she is taking.  I would not change nifedipine to cardizem since cardizem slows heart rate and HR was 53 last visit. She should stay the course.  Tyler Cubit Martinique MD, [facility]

## 2016-06-19 NOTE — Telephone Encounter (Signed)
New message    Pt states that she wants rn to call her she did not want to go into detail she said it is about and appt and changing her medication

## 2016-06-19 NOTE — Telephone Encounter (Signed)
Spoke with pt, aware of dr jordan's recommendations. 

## 2016-06-19 NOTE — Telephone Encounter (Signed)
Spoke with pt, she is on atorvastatin and simvastatin would be cheaper for her. Also she is on nifedipine and diltiazem would be cheaper. She wanted to know if dr Martinique would approve of the change to different medications. Will forward to dr Martinique to review and advise

## 2016-07-06 DIAGNOSIS — H25812 Combined forms of age-related cataract, left eye: Secondary | ICD-10-CM | POA: Diagnosis not present

## 2016-07-06 DIAGNOSIS — H2512 Age-related nuclear cataract, left eye: Secondary | ICD-10-CM | POA: Diagnosis not present

## 2016-07-06 DIAGNOSIS — H25012 Cortical age-related cataract, left eye: Secondary | ICD-10-CM | POA: Diagnosis not present

## 2016-07-28 DIAGNOSIS — M810 Age-related osteoporosis without current pathological fracture: Secondary | ICD-10-CM | POA: Diagnosis not present

## 2016-07-28 DIAGNOSIS — I10 Essential (primary) hypertension: Secondary | ICD-10-CM | POA: Diagnosis not present

## 2016-07-28 DIAGNOSIS — E784 Other hyperlipidemia: Secondary | ICD-10-CM | POA: Diagnosis not present

## 2016-08-04 DIAGNOSIS — Z1389 Encounter for screening for other disorder: Secondary | ICD-10-CM | POA: Diagnosis not present

## 2016-08-04 DIAGNOSIS — Z6823 Body mass index (BMI) 23.0-23.9, adult: Secondary | ICD-10-CM | POA: Diagnosis not present

## 2016-08-04 DIAGNOSIS — Z0000 Encounter for general adult medical examination without abnormal findings: Secondary | ICD-10-CM | POA: Diagnosis not present

## 2016-08-04 DIAGNOSIS — K219 Gastro-esophageal reflux disease without esophagitis: Secondary | ICD-10-CM | POA: Diagnosis not present

## 2016-08-04 DIAGNOSIS — M4800 Spinal stenosis, site unspecified: Secondary | ICD-10-CM | POA: Diagnosis not present

## 2016-08-04 DIAGNOSIS — M5416 Radiculopathy, lumbar region: Secondary | ICD-10-CM | POA: Diagnosis not present

## 2016-08-04 DIAGNOSIS — E784 Other hyperlipidemia: Secondary | ICD-10-CM | POA: Diagnosis not present

## 2016-08-04 DIAGNOSIS — I350 Nonrheumatic aortic (valve) stenosis: Secondary | ICD-10-CM | POA: Diagnosis not present

## 2016-08-04 DIAGNOSIS — I10 Essential (primary) hypertension: Secondary | ICD-10-CM | POA: Diagnosis not present

## 2016-08-04 DIAGNOSIS — F418 Other specified anxiety disorders: Secondary | ICD-10-CM | POA: Diagnosis not present

## 2016-08-04 DIAGNOSIS — Z9861 Coronary angioplasty status: Secondary | ICD-10-CM | POA: Diagnosis not present

## 2016-08-04 DIAGNOSIS — M810 Age-related osteoporosis without current pathological fracture: Secondary | ICD-10-CM | POA: Diagnosis not present

## 2016-08-11 ENCOUNTER — Encounter: Payer: Self-pay | Admitting: Cardiology

## 2016-08-29 DIAGNOSIS — Z23 Encounter for immunization: Secondary | ICD-10-CM | POA: Diagnosis not present

## 2016-10-01 NOTE — Progress Notes (Signed)
Cardiology Office Note    Date:  10/02/2016   ID:  [name], DOB July 22, 1929, MRN [id number]  PCP:  [name], MD  Cardiologist:  [name], MD    History of Present Illness:  [name] is a 82 y.o. female who has a history of coronary disease and is s/p prior angioplasty of the diagonal branch in 1992. She had stenting of the right coronary in 2005. She has a history of hypertension. She has a history of mild-moderate aortic stenosis by Echo in 2015. She has chronic  back pain related to scoliosis.  She is now living at [facility].   On follow up today she really has no cardiac complaints. Mainly limited by back pain. No chest pain, dyspnea, edema, or palpitations. Appetite is good.    Past Medical History:  Diagnosis Date  . Anxiety and depression   . Aortic stenosis, mild   . CAD (coronary artery disease)   . Diverticula of colon   . GERD (gastroesophageal reflux disease)   . HTN (hypertension)   . Hypercholesteremia   . Lateral myocardial infarction ([place]) 1992  . Osteoarthritis   . Osteopenia   . Scoliosis     Past Surgical History:  Procedure Laterality Date  . ABDOMINAL HYSTERECTOMY    . APPENDECTOMY    . CORONARY ANGIOPLASTY    . TONSILLECTOMY      Current Medications: Outpatient Medications Prior to Visit  Medication Sig Dispense Refill  . aspirin 81 MG tablet Take 162 mg by mouth daily.     [name] Kitchen atorvastatin (LIPITOR) 40 MG tablet Take 1 tablet (40 mg total) by mouth daily. 90 tablet 3  . cholecalciferol (VITAMIN D) 1000 UNITS tablet Take 1,000 Units by mouth 2 (two) times daily.    [name] Kitchen FIBER FORMULA PO Take 1 tablet by mouth daily.    . hydrochlorothiazide (HYDRODIURIL) 25 MG tablet Take 1 tablet (25 mg total) by mouth daily. 90 tablet 2  . levocetirizine (XYZAL) 5 MG tablet Take 5 mg by mouth as needed.     . Melatonin 1 MG CAPS Take 1 capsule by mouth daily.    . meloxicam (MOBIC) 15 MG tablet Take 15 mg by mouth daily.    .  metoprolol succinate (TOPROL-XL) 50 MG 24 hr tablet Take 1 tablet (50 mg total) by mouth daily. Take with or immediately following a meal. 90 tablet 3  . NIFEdipine (ADALAT CC) 60 MG 24 hr tablet Take 1 tablet (60 mg total) by mouth daily. 90 tablet 3  . nitroGLYCERIN (NITROSTAT) 0.4 MG SL tablet Place 1 tablet (0.4 mg total) under the tongue every 5 (five) minutes as needed for chest pain. 25 tablet 12  . Nutritional Supplements (MELATONIN PO) Take by mouth daily as needed.      . pantoprazole (PROTONIX) 40 MG tablet Take 40 mg by mouth daily.    . NON FORMULARY hylands (for leg cramps)     No facility-administered medications prior to visit.      Allergies:   Nifedipine; Tetanus toxoids; and Adhesive [tape]   Social History   Social History  . Marital status: Divorced    Spouse name: N/A  . Number of children: 1  . Years of education: N/A   Occupational History  . retired    Social History Main Topics  . Smoking status: Former Smoker    Types: Cigarettes    Quit date: 12/26/1991  . Smokeless tobacco: None  . Alcohol  use No  . Drug use: No  . Sexual activity: Not Asked   Other Topics Concern  . None   Social History Narrative  . None     Family History:  The patient's family history includes Cancer in her brother; Heart failure in her mother; Hypertension in her father and mother.   ROS:   Please see the history of present illness.    ROS All other systems reviewed and are negative.   PHYSICAL EXAM:   VS:  BP (!) 185/85   Pulse (!) 55   Ht 5\' 3"  (1.6 m)   Wt 126 lb (57.2 kg)   SpO2 94%   BMI 22.32 kg/m    GEN: Well nourished, well developed, in no acute distress  HEENT: normal  Neck: no JVD, carotid bruits, or masses Cardiac: RRR; harsh grade 2/6 systolic murmur RUSB Respiratory:  clear to auscultation bilaterally, normal work of breathing GI: soft, nontender, nondistended, + BS MS: no deformity or atrophy  Skin: warm and dry, no rash Neuro:  Alert and  Oriented x 3, Strength and sensation are intact Psych: euthymic mood, full affect  Wt Readings from Last 3 Encounters:  10/02/16 126 lb (57.2 kg)  04/04/16 123 lb 3.2 oz (55.9 kg)  08/19/15 123 lb 1.6 oz (55.8 kg)      Studies/Labs Reviewed:   EKG:  EKG is not ordered today.  The ekg ordered today demonstrates N/A  Recent Labs: No results found for requested labs within last 8760 hours.   Lipid Panel    Component Value Date/Time   CHOL 146 08/22/2013 0946   TRIG 120.0 08/22/2013 0946   HDL 60.00 08/22/2013 0946   CHOLHDL 2 08/22/2013 0946   VLDL 24.0 08/22/2013 0946   LDLCALC 62 08/22/2013 0946    Additional studies/ records that were reviewed today include:  Labs from 07/28/16: cholesterol 156, triglycerides 42, LDL 83, HDL 65. CMET, TSH, CBC all normal.    ASSESSMENT:    1. Aortic stenosis, mild   2. Coronary artery disease involving native coronary artery of native heart without angina pectoris   3. Essential hypertension   4. Hypercholesteremia      PLAN:  In order of problems listed above:  1. Aortic stenosis  Murmur is stable and she is asymptomatic. Will follow 2. Continue ASA, beta blocker and statin. She is asymptomatic. 3. Continue current BP and statin therapy.    Medication Adjustments/Labs and Tests Ordered: Current medicines are reviewed at length with the patient today.  Concerns regarding medicines are outlined above.  Medication changes, Labs and Tests ordered today are listed in the Patient Instructions below. Patient Instructions  Continue your current therapy  I will see you in 6 months.    Signed, [name], MD  10/02/2016 8:51 PM    [facility] [address] [place], [place], [address] [phone]

## 2016-10-02 ENCOUNTER — Encounter: Payer: Self-pay | Admitting: Cardiology

## 2016-10-02 ENCOUNTER — Ambulatory Visit (INDEPENDENT_AMBULATORY_CARE_PROVIDER_SITE_OTHER): Payer: Medicare Other | Admitting: Cardiology

## 2016-10-02 VITALS — BP 185/85 | HR 55 | Ht 63.0 in | Wt 126.0 lb

## 2016-10-02 DIAGNOSIS — I10 Essential (primary) hypertension: Secondary | ICD-10-CM | POA: Diagnosis not present

## 2016-10-02 DIAGNOSIS — I2510 Atherosclerotic heart disease of native coronary artery without angina pectoris: Secondary | ICD-10-CM

## 2016-10-02 DIAGNOSIS — E7800 Pure hypercholesterolemia, unspecified: Secondary | ICD-10-CM | POA: Diagnosis not present

## 2016-10-02 DIAGNOSIS — I350 Nonrheumatic aortic (valve) stenosis: Secondary | ICD-10-CM | POA: Diagnosis not present

## 2016-10-02 NOTE — Patient Instructions (Signed)
Continue your current therapy  I will see you in 6 months.   

## 2016-12-02 ENCOUNTER — Emergency Department (HOSPITAL_COMMUNITY)
Admission: EM | Admit: 2016-12-02 | Discharge: 2016-12-02 | Disposition: A | Discharge status: Home / Self Care | Payer: Medicare Other | Attending: Emergency Medicine | Admitting: Emergency Medicine

## 2016-12-02 ENCOUNTER — Encounter (HOSPITAL_COMMUNITY): Payer: Self-pay | Admitting: Emergency Medicine

## 2016-12-02 DIAGNOSIS — W268XXA Contact with other sharp object(s), not elsewhere classified, initial encounter: Secondary | ICD-10-CM | POA: Insufficient documentation

## 2016-12-02 DIAGNOSIS — Y939 Activity, unspecified: Secondary | ICD-10-CM | POA: Diagnosis not present

## 2016-12-02 DIAGNOSIS — I10 Essential (primary) hypertension: Secondary | ICD-10-CM | POA: Insufficient documentation

## 2016-12-02 DIAGNOSIS — I252 Old myocardial infarction: Secondary | ICD-10-CM | POA: Diagnosis not present

## 2016-12-02 DIAGNOSIS — Y999 Unspecified external cause status: Secondary | ICD-10-CM | POA: Diagnosis not present

## 2016-12-02 DIAGNOSIS — I2510 Atherosclerotic heart disease of native coronary artery without angina pectoris: Secondary | ICD-10-CM | POA: Diagnosis not present

## 2016-12-02 DIAGNOSIS — Z7982 Long term (current) use of aspirin: Secondary | ICD-10-CM | POA: Insufficient documentation

## 2016-12-02 DIAGNOSIS — S61012A Laceration without foreign body of left thumb without damage to nail, initial encounter: Secondary | ICD-10-CM | POA: Insufficient documentation

## 2016-12-02 DIAGNOSIS — Y929 Unspecified place or not applicable: Secondary | ICD-10-CM | POA: Insufficient documentation

## 2016-12-02 DIAGNOSIS — Z87891 Personal history of nicotine dependence: Secondary | ICD-10-CM | POA: Diagnosis not present

## 2016-12-02 MED ORDER — LIDOCAINE HCL 2 % IJ SOLN
10.0000 mL | Freq: Once | INTRAMUSCULAR | Status: DC
  Filled 2016-12-02: qty 20

## 2016-12-02 NOTE — ED Notes (Signed)
Laceration cleaned and dressing applied prior to discharge.

## 2016-12-02 NOTE — ED Triage Notes (Addendum)
Patient reports cutting her left thumb while opening a can this morning. Approx 1 inch laceration. Bleeding controlled. Patient states she takes blood thinners.

## 2016-12-02 NOTE — Discharge Instructions (Signed)
Please have your sutures remove in 5-7 days.  Return sooner if you notice signs of infection.  Take over the counter tylenol as needed for pain.

## 2016-12-02 NOTE — ED Provider Notes (Signed)
[facility] DEPT Provider Note   CSN: [id number] Arrival date & time: 12/02/16  1331  By signing my name below, I, [name], attest that this documentation has been prepared under the direction and in the presence of [name], PA-C. Electronically Signed: Neta [name], ED Scribe. 12/02/2016. 1:55 PM.    History   Chief Complaint Chief Complaint  Patient presents with  . Extremity Laceration    The history is provided by the patient. No language interpreter was used.   HPI Comments:  [name] is a 82 y.o. female with PMHx of CAD and MI who presents to the Emergency Department, here due to a laceration that she sustained 3 hours ago. Pt reports that she cut her left hand on the sharp edge of a metal can. Pt states that the laceration is not painful. Pt takes anticoagulates. Pt is right hand dominant. Pt reports an allergy to the tetanus shot. Pt has been able to control bleeding with a rag. Pt denies numbness.   Past Medical History:  Diagnosis Date  . Anxiety and depression   . Aortic stenosis, mild   . CAD (coronary artery disease)   . Diverticula of colon   . GERD (gastroesophageal reflux disease)   . HTN (hypertension)   . Hypercholesteremia   . Lateral myocardial infarction ([place]) 1992  . Osteoarthritis   . Osteopenia   . Scoliosis     Patient Active Problem List   Diagnosis Date Noted  . Dizzy 06/05/2013  . Aortic stenosis, mild   . CAD (coronary artery disease)   . HTN (hypertension)   . Hypercholesteremia   . Anxiety and depression   . Lateral myocardial infarction ([place])   . Scoliosis   . GERD 06/09/2009  . PERSONAL HISTORY OF COLONIC POLYPS 06/09/2009    Past Surgical History:  Procedure Laterality Date  . ABDOMINAL HYSTERECTOMY    . APPENDECTOMY    . CORONARY ANGIOPLASTY    . TONSILLECTOMY      OB History    No data available       Home Medications    Prior to Admission medications   Medication Sig Start Date End  Date Taking? Authorizing Provider  aspirin 81 MG tablet Take 162 mg by mouth daily.     Historical Provider, MD  atorvastatin (LIPITOR) 40 MG tablet Take 1 tablet (40 mg total) by mouth daily. 04/04/16   Peter M Martinique, MD  cholecalciferol (VITAMIN D) 1000 UNITS tablet Take 1,000 Units by mouth 2 (two) times daily.    Historical Provider, MD  FIBER FORMULA PO Take 1 tablet by mouth daily.    Historical Provider, MD  hydrochlorothiazide (HYDRODIURIL) 25 MG tablet Take 1 tablet (25 mg total) by mouth daily. 05/08/16   Peter M Martinique, MD  levocetirizine (XYZAL) 5 MG tablet Take 5 mg by mouth as needed.     Historical Provider, MD  Melatonin 1 MG CAPS Take 1 capsule by mouth daily.    Historical Provider, MD  meloxicam (MOBIC) 15 MG tablet Take 15 mg by mouth daily.    Historical Provider, MD  metoprolol succinate (TOPROL-XL) 50 MG 24 hr tablet Take 1 tablet (50 mg total) by mouth daily. Take with or immediately following a meal. 04/04/16   Peter M Martinique, MD  NIFEdipine (ADALAT CC) 60 MG 24 hr tablet Take 1 tablet (60 mg total) by mouth daily. 04/12/16   Peter M Martinique, MD  nitroGLYCERIN (NITROSTAT) 0.4 MG SL tablet Place  1 tablet (0.4 mg total) under the tongue every 5 (five) minutes as needed for chest pain. 04/04/16   Peter M Martinique, MD  Nutritional Supplements (MELATONIN PO) Take by mouth daily as needed.      Historical Provider, MD  pantoprazole (PROTONIX) 40 MG tablet Take 40 mg by mouth daily.    Historical Provider, MD    Family History Family History  Problem Relation Age of Onset  . Hypertension Father   . Heart failure Mother   . Hypertension Mother   . Cancer Brother     Social History Social History  Substance Use Topics  . Smoking status: Former Smoker    Types: Cigarettes    Quit date: 12/26/1991  . Smokeless tobacco: Not on file  . Alcohol use No     Allergies   Nifedipine; Tetanus toxoids; and Adhesive [tape]   Review of Systems Review of Systems  Skin: Positive for  wound.  Neurological: Negative for numbness.  All other systems reviewed and are negative.    Physical Exam Updated Vital Signs BP 139/92   Pulse 61   Temp 97.5 F (36.4 C) (Oral)   Resp 16   SpO2 91%   Physical Exam  Constitutional: She appears well-developed and well-nourished. No distress.  HENT:  Head: Normocephalic and atraumatic.  Eyes: Conjunctivae are normal.  Cardiovascular: Normal rate.   Pulmonary/Chest: Effort normal.  Abdominal: She exhibits no distension.  Neurological: She is alert.  Skin: Skin is warm and dry.  Left thumb: 2cm crescent shape laceration overlying MCP, no active bleeding, no foreign object noted, sensation intact distally. Full ROM throughout joint.   Psychiatric: She has a normal mood and affect.  Nursing note and vitals reviewed.    ED Treatments / Results  DIAGNOSTIC STUDIES:  Oxygen Saturation is 100% on RA, normal by my interpretation.    COORDINATION OF CARE:  1:55 PM Discussed treatment plan with pt at bedside and pt agreed to plan.   Labs (all labs ordered are listed, but only abnormal results are displayed) Labs Reviewed - No data to display  EKG  EKG Interpretation None       Radiology No results found.  Procedures Procedures (including critical care time)  LACERATION REPAIR Performed by: [name] Authorized by: [name] Consent: Verbal consent obtained. Risks and benefits: risks, benefits and alternatives were discussed Consent given by: patient Patient identity confirmed: provided demographic data Prepped and Draped in normal sterile fashion Wound explored  Laceration Location: L thumb, overlying MCP  Laceration Length: 2cm  No Foreign Bodies seen or palpated  Anesthesia: local infiltration  Local anesthetic: lidocaine 2% w/o epinephrine  Anesthetic total: 2 ml  Irrigation method: syringe Amount of cleaning: standard  Skin closure: prolene 5.0  Number of sutures: 5  Technique: simple  interrupted  Patient tolerance: Patient tolerated the procedure well with no immediate complications.   Medications Ordered in ED Medications - No data to display   Initial Impression / Assessment and Plan / ED Course  Tetanus not given as pt is allergic to it. Laceration occurred < 12 hours prior to repair. Discussed laceration care with pt and answered questions. Pt to f-u for suture removal in 5-7 days and wound check sooner should there be signs of dehiscence or infection. Pt is hemodynamically stable with no complaints prior to dc.    I have reviewed the triage vital signs and the nursing notes.  Pertinent labs & imaging results that were available during my care of the  patient were reviewed by me and considered in my medical decision making (see chart for details).  Clinical Course     BP 139/92   Pulse 61   Temp 97.5 F (36.4 C) (Oral)   Resp 16   SpO2 91%    Final Clinical Impressions(s) / ED Diagnoses   Final diagnoses:  Thumb laceration, left, initial encounter    New Prescriptions New Prescriptions   No medications on file   I personally performed the services described in this documentation, which was scribed in my presence. The recorded information has been reviewed and is accurate.       [name], PA-C 12/02/16 1438    [name], PA-C 12/02/16 [place], MD 12/02/16 [phone]

## 2016-12-08 DIAGNOSIS — Z4802 Encounter for removal of sutures: Secondary | ICD-10-CM | POA: Diagnosis not present

## 2016-12-08 DIAGNOSIS — S61012A Laceration without foreign body of left thumb without damage to nail, initial encounter: Secondary | ICD-10-CM | POA: Diagnosis not present

## 2016-12-08 DIAGNOSIS — Z6823 Body mass index (BMI) 23.0-23.9, adult: Secondary | ICD-10-CM | POA: Diagnosis not present

## 2016-12-08 DIAGNOSIS — Z5189 Encounter for other specified aftercare: Secondary | ICD-10-CM | POA: Diagnosis not present

## 2017-01-25 ENCOUNTER — Telehealth: Payer: Self-pay | Admitting: Cardiology

## 2017-01-25 NOTE — Telephone Encounter (Signed)
New message    Returning Cheryls call

## 2017-01-25 NOTE — Telephone Encounter (Signed)
Returned call to patient she wanted to schedule follow up appointment with Dr.Jordan.Appointment scheduled 03/28/17 at 1:35 pm.

## 2017-03-04 ENCOUNTER — Other Ambulatory Visit: Payer: Self-pay | Admitting: Cardiology

## 2017-03-18 ENCOUNTER — Other Ambulatory Visit: Payer: Self-pay | Admitting: Cardiology

## 2017-03-26 NOTE — Progress Notes (Signed)
Cardiology Office Note    Date:  03/27/2017   ID:  [name], DOB Oct 31, 1929, MRN [id number]  PCP:  [name], MD  Cardiologist: Dr. Martinique  Chief Complaint  Patient presents with  . Follow-up    6 months    History of Present Illness:    [name] is a 83 y.o. female with past medical history of CAD (s/p prior PCI of D2 in 1992, stent to RCA in 2005), moderate AS (by echo in 03/2014), HTN, and HLD who presents to the office today for 61-month follow-up.   She was last seen by Dr. Martinique in 09/2016 and denied any recent chest pain or dyspnea with exertion at that time. She was continued on ASA 81mg  daily, Lipitor 40mg  daily, HCTZ 25mg  daily, Toprol-XL 50mg  daily, and Nifedipine 60mg  daily.   In talking with the patient today, she reports having one episode of chest pain on 01/18/2017. She kept a detailed log of this and her initial blood pressure was in the 671'I systolically. She took 3 sublingual nitroglycerin with complete resolution of her symptoms and improvement of her blood pressure to a systolic readings in the 458'K. This episode occurred while sitting down on her couch and she denies any associated dyspnea, palpitations, lightheadedness, dizziness, or presyncope. Denies any repeat episodes of chest pain since.  She is active at baseline, walking around the grocery store weekly and exercising daily on a stationary bicycle. She denies any exertional chest pain or dyspnea on exertion with these activities.  She checks her blood pressure regularly at home and reports systolic readings in the 998P to 130s. Reports good compliance with her medication regimen.  Past Medical History:  Diagnosis Date  . Anxiety and depression   . Aortic stenosis    a. 03/2014: Mild aortic stenosis by valve area and mean gradient though appeared more visually consistent with moderate AS.   [name] Kitchen CAD (coronary artery disease)    a. s/p prior PCI of D2 in 1992 b. stent to RCA in 2005  .  Diverticula of colon   . GERD (gastroesophageal reflux disease)   . HTN (hypertension)   . Hypercholesteremia   . Lateral myocardial infarction ([place]) 1992  . Osteoarthritis   . Osteopenia   . Scoliosis     Past Surgical History:  Procedure Laterality Date  . ABDOMINAL HYSTERECTOMY    . APPENDECTOMY    . CORONARY ANGIOPLASTY    . TONSILLECTOMY      Current Medications: Outpatient Medications Prior to Visit  Medication Sig Dispense Refill  . aspirin 81 MG tablet Take 162 mg by mouth daily.     [name] Kitchen atorvastatin (LIPITOR) 40 MG tablet TAKE 1 TABLET BY MOUTH EVERY DAY 90 tablet 2  . cholecalciferol (VITAMIN D) 1000 UNITS tablet Take 1,000 Units by mouth 2 (two) times daily.    [name] Kitchen FIBER FORMULA PO Take 1 tablet by mouth daily.    . hydrochlorothiazide (HYDRODIURIL) 25 MG tablet Take 1 tablet (25 mg total) by mouth daily. 90 tablet 2  . levocetirizine (XYZAL) 5 MG tablet Take 5 mg by mouth as needed.     . Melatonin 1 MG CAPS Take 1 capsule by mouth daily.    . meloxicam (MOBIC) 15 MG tablet Take 15 mg by mouth daily.    . metoprolol succinate (TOPROL-XL) 50 MG 24 hr tablet Take 1 tablet (50 mg total) by mouth daily. Take with or immediately following a meal. 90 tablet 3  .  NIFEdipine (PROCARDIA-XL/ADALAT CC) 60 MG 24 hr tablet TAKE 1 TABLET BY MOUTH EVERY DAY--MYLAN [employer]AND ([address]-38) 90 tablet 2  . nitroGLYCERIN (NITROSTAT) 0.4 MG SL tablet Place 1 tablet (0.4 mg total) under the tongue every 5 (five) minutes as needed for chest pain. 25 tablet 12  . Nutritional Supplements (MELATONIN PO) Take by mouth daily as needed.      . pantoprazole (PROTONIX) 40 MG tablet Take 40 mg by mouth daily.     No facility-administered medications prior to visit.      Allergies:   Nifedipine; Tetanus toxoids; and Adhesive [tape]   Social History   Social History  . Marital status: Divorced    Spouse name: N/A  . Number of children: 1  . Years of education: N/A   Occupational History  .  retired    Social History Main Topics  . Smoking status: Former Smoker    Types: Cigarettes    Quit date: 12/26/1991  . Smokeless tobacco: Never Used  . Alcohol use No  . Drug use: Yes    Types: Methylphenidate  . Sexual activity: Not Asked   Other Topics Concern  . None   Social History Narrative  . None     Family History:  The patient's family history includes Cancer in her brother; Heart failure in her mother; Hypertension in her father and mother.   Review of Systems:   Please see the history of present illness.     General:  No chills, fever, night sweats or weight changes.  Cardiovascular:  No dyspnea on exertion, edema, orthopnea, palpitations, paroxysmal nocturnal dyspnea. Positive for chest pain (occurring in 12/2016).  Dermatological: No rash, lesions/masses Respiratory: No cough, dyspnea Urologic: No hematuria, dysuria Abdominal:   No nausea, vomiting, diarrhea, bright red blood per rectum, melena, or hematemesis Neurologic:  No visual changes, wkns, changes in mental status. All other systems reviewed and are otherwise negative except as noted above.   Physical Exam:    VS:  BP 134/72   Pulse (!) 51   Ht 5\' 3"  (1.6 m)   Wt 124 lb 3.2 oz (56.3 kg)   BMI 22.00 kg/m    General: Well developed, well nourished, elderly Caucasian female appearing in no acute distress. Head: Normocephalic, atraumatic, sclera non-icteric, no xanthomas, nares are without discharge.  Neck: No carotid bruits. JVD not elevated.  Lungs: Respirations regular and unlabored, without wheezes or rales.  Heart: Regular rate and rhythm. No S3 or S4.  No rubs or gallops appreciated. 2/6 SEM along RUSB.  Abdomen: Soft, non-tender, non-distended with normoactive bowel sounds. No hepatomegaly. No rebound/guarding. No obvious abdominal masses. Msk:  Strength and tone appear normal for age. No joint deformities or effusions. Extremities: No clubbing or cyanosis. No lower extremity edema.  Distal  pedal pulses are 2+ bilaterally. Neuro: Alert and oriented X 3. Moves all extremities spontaneously. No focal deficits noted. Psych:  Responds to questions appropriately with a normal affect. Skin: No rashes or lesions noted  Wt Readings from Last 3 Encounters:  03/27/17 124 lb 3.2 oz (56.3 kg)  10/02/16 126 lb (57.2 kg)  04/04/16 123 lb 3.2 oz (55.9 kg)     Studies/Labs Reviewed:   EKG:  EKG is ordered today.  The ekg ordered today demonstrates sinus bradycardia, HR 51, with known RBBB.   Recent Labs: No results found for requested labs within last 8760 hours.   Lipid Panel    Component Value Date/Time   CHOL 146 08/22/2013 0946  TRIG 120.0 08/22/2013 0946   HDL 60.00 08/22/2013 0946   CHOLHDL 2 08/22/2013 0946   VLDL 24.0 08/22/2013 0946   LDLCALC 62 08/22/2013 0946    Additional studies/ records that were reviewed today include:   Echocardiogram: 03/2014 Study Conclusions  - Left ventricle: The cavity size was normal. Wall thickness was increased in a pattern of mild LVH. Systolic function was normal. The estimated ejection fraction was in the range of 60% to 65%. Wall motion was normal; there were no regional wall motion abnormalities. Doppler parameters are consistent with abnormal left ventricular relaxation (grade 1 diastolic dysfunction). - Aortic valve: Trileaflet; moderately calcified leaflets. Mild aortic stenosis by valve area and mean gradient. Visually appeared more moderately stenosed. Mean gradient: 27mm Hg (S). Peak gradient: 49mm Hg (S). Valve area: 1.59cm^2(VTI). - Mitral valve: Mildly calcified annulus. Mildly calcified leaflets . Trivial regurgitation. - Left atrium: The atrium was mildly dilated. - Right ventricle: The cavity size was normal. Systolic function was normal. - Right atrium: The atrium was mildly dilated. - Tricuspid valve: Peak RV-RA gradient: 40mm Hg (S). - Pulmonary arteries: PA peak pressure: 21mm Hg  (S). - Inferior vena cava: The vessel was normal in size; the respirophasic diameter changes were in the normal range (= 50%); findings are consistent with normal central venous pressure. Impressions:  - Normal LV size with mild LV hypertrophy. EF 60-65%. Normal RV size and systolic function. There was mild aortic stenosis by valve area/mean gradient though valve appeared more moderately stenosed visually. Mild pulmonary hypertension.  Assessment:    1. Coronary artery disease involving native coronary artery of native heart without angina pectoris   2. Essential hypertension   3. Aortic stenosis, mild   4. Hypercholesteremia      Plan:   In order of problems listed above:  1. CAD - s/p prior PCI of D2 in 1992, stent to RCA in 2005. She reports one episode of chest pain occurring in 12/2016 while at rest and improving with SL NTG. Says she was "anxious about her taxes" at that time and feels like this contributed to her symptoms.  - she is active at baseline, exercising with a stationary bike daily. No exertional chest pain or dyspnea on exertion. Her overall episode in 12/2016 sounds atypical for a cardiac etiology. We discussed further ischemic evaluation with a NST in the future if she developed recurrent symptoms.  - continue ASA, statin, and BB therapy.    2. HTN - BP well-controlled at 134/72 during today's visit. Reports similar readings when checked out of the office.  - continue HCTZ 25mg  daily, Toprol-XL 50mg  daily, and Nifedipine 60mg  daily.   3. Aortic Stenosis - mild to moderate by echo in 03/2014. Soft murmur appreciated on examination.  - Consider repeat echocardiogram next year.  4. HLD - continue Atorvastatin 40mg  daily.  - followed by PCP.   Medication Adjustments/Labs and Tests Ordered: Current medicines are reviewed at length with the patient today.  Concerns regarding medicines are outlined above.  Medication changes, Labs and Tests  ordered today are listed in the Patient Instructions below. Patient Instructions  Medication Instructions: Your physician recommends that you continue on your current medications as directed. Please refer to the Current Medication list given to you today.  Follow-Up: Your physician wants you to follow-up in: 6 months with Dr. Martinique. You will receive a reminder letter in the mail two months in advance. If you don't receive a letter, please call our office to schedule the  follow-up appointment.  If you need a refill on your cardiac medications before your next appointment, please call your pharmacy.   Arna Medici, PA  03/27/2017 4:27 PM    [facility] [address], [place], [place]  [address] Phone: [phone]; Fax: [phone]  [address], [place], [place] [address] Phone: [phone]

## 2017-03-27 ENCOUNTER — Ambulatory Visit (INDEPENDENT_AMBULATORY_CARE_PROVIDER_SITE_OTHER): Payer: Medicare Other | Admitting: Student

## 2017-03-27 ENCOUNTER — Encounter: Payer: Self-pay | Admitting: Student

## 2017-03-27 VITALS — BP 134/72 | HR 51 | Ht 63.0 in | Wt 124.2 lb

## 2017-03-27 DIAGNOSIS — I10 Essential (primary) hypertension: Secondary | ICD-10-CM

## 2017-03-27 DIAGNOSIS — I350 Nonrheumatic aortic (valve) stenosis: Secondary | ICD-10-CM | POA: Diagnosis not present

## 2017-03-27 DIAGNOSIS — E7800 Pure hypercholesterolemia, unspecified: Secondary | ICD-10-CM | POA: Diagnosis not present

## 2017-03-27 DIAGNOSIS — I2510 Atherosclerotic heart disease of native coronary artery without angina pectoris: Secondary | ICD-10-CM

## 2017-03-27 MED ORDER — NITROGLYCERIN 0.4 MG SL SUBL: 0.4000 mg | SUBLINGUAL_TABLET | SUBLINGUAL | 2 refills | Status: DC | PRN

## 2017-03-27 NOTE — Patient Instructions (Signed)
Medication Instructions: Your physician recommends that you continue on your current medications as directed. Please refer to the Current Medication list given to you today.  Follow-Up: Your physician wants you to follow-up in: 6 months with Dr. Jordan. You will receive a reminder letter in the mail two months in advance. If you don't receive a letter, please call our office to schedule the follow-up appointment.  If you need a refill on your cardiac medications before your next appointment, please call your pharmacy.  

## 2017-03-28 ENCOUNTER — Ambulatory Visit: Payer: Medicare Other | Admitting: Cardiology

## 2017-05-12 ENCOUNTER — Other Ambulatory Visit: Payer: Self-pay | Admitting: Cardiology

## 2017-08-05 ENCOUNTER — Other Ambulatory Visit: Payer: Self-pay | Admitting: Cardiology

## 2017-08-06 DIAGNOSIS — I10 Essential (primary) hypertension: Secondary | ICD-10-CM | POA: Diagnosis not present

## 2017-08-06 DIAGNOSIS — E784 Other hyperlipidemia: Secondary | ICD-10-CM | POA: Diagnosis not present

## 2017-08-06 DIAGNOSIS — M810 Age-related osteoporosis without current pathological fracture: Secondary | ICD-10-CM | POA: Diagnosis not present

## 2017-08-13 DIAGNOSIS — Z1389 Encounter for screening for other disorder: Secondary | ICD-10-CM | POA: Diagnosis not present

## 2017-08-13 DIAGNOSIS — R808 Other proteinuria: Secondary | ICD-10-CM | POA: Diagnosis not present

## 2017-08-13 DIAGNOSIS — F418 Other specified anxiety disorders: Secondary | ICD-10-CM | POA: Diagnosis not present

## 2017-08-13 DIAGNOSIS — Z9861 Coronary angioplasty status: Secondary | ICD-10-CM | POA: Diagnosis not present

## 2017-08-13 DIAGNOSIS — M810 Age-related osteoporosis without current pathological fracture: Secondary | ICD-10-CM | POA: Diagnosis not present

## 2017-08-13 DIAGNOSIS — Z0000 Encounter for general adult medical examination without abnormal findings: Secondary | ICD-10-CM | POA: Diagnosis not present

## 2017-08-13 DIAGNOSIS — I208 Other forms of angina pectoris: Secondary | ICD-10-CM | POA: Diagnosis not present

## 2017-08-13 DIAGNOSIS — K219 Gastro-esophageal reflux disease without esophagitis: Secondary | ICD-10-CM | POA: Diagnosis not present

## 2017-08-13 DIAGNOSIS — Z6822 Body mass index (BMI) 22.0-22.9, adult: Secondary | ICD-10-CM | POA: Diagnosis not present

## 2017-08-13 DIAGNOSIS — I10 Essential (primary) hypertension: Secondary | ICD-10-CM | POA: Diagnosis not present

## 2017-08-13 DIAGNOSIS — I350 Nonrheumatic aortic (valve) stenosis: Secondary | ICD-10-CM | POA: Diagnosis not present

## 2017-08-13 DIAGNOSIS — E784 Other hyperlipidemia: Secondary | ICD-10-CM | POA: Diagnosis not present

## 2017-08-20 DIAGNOSIS — Z1212 Encounter for screening for malignant neoplasm of rectum: Secondary | ICD-10-CM | POA: Diagnosis not present

## 2017-08-24 DIAGNOSIS — Z23 Encounter for immunization: Secondary | ICD-10-CM | POA: Diagnosis not present

## 2017-08-30 DIAGNOSIS — Z23 Encounter for immunization: Secondary | ICD-10-CM | POA: Diagnosis not present

## 2017-09-20 ENCOUNTER — Telehealth: Payer: Self-pay | Admitting: Cardiology

## 2017-09-20 NOTE — Telephone Encounter (Signed)
Received records from [facility] for appointment on 09/26/17 with Dr Martinique.  Records put with Dr Doug Sou schedule for 09/26/17. lp

## 2017-09-21 NOTE — Progress Notes (Signed)
Cardiology Office Note    Date:  09/26/2017   ID:  [name], DOB 07/14/1929, MRN [id number]  PCP:  [name], MD  Cardiologist:  Hildred Mollica Martinique, MD    History of Present Illness:  [name] is a 83 y.o. female who has a history of coronary disease and is s/p prior angioplasty of the diagonal branch in 1992. She had stenting of the right coronary in 2005. She has a history of hypertension. She has a history of mild-moderate aortic stenosis by Echo in 2015. She has chronic back pain related to scoliosis.  She is now living at [facility].   On follow up today she really has no cardiac complaints. Still complains of back and hip pain. Uses Meloxicam on a prn basis. No chest pain, dyspnea, edema, or palpitations. Appetite is good. She does complain of feeling jittery.   Past Medical History:  Diagnosis Date  . Anxiety and depression   . Aortic stenosis    a. 03/2014: Mild aortic stenosis by valve area and mean gradient though appeared more visually consistent with moderate AS.   [name] Kitchen CAD (coronary artery disease)    a. s/p prior PCI of D2 in 1992 b. stent to RCA in 2005  . Diverticula of colon   . GERD (gastroesophageal reflux disease)   . HTN (hypertension)   . Hypercholesteremia   . Lateral myocardial infarction ([place]) 1992  . Osteoarthritis   . Osteopenia   . Scoliosis     Past Surgical History:  Procedure Laterality Date  . ABDOMINAL HYSTERECTOMY    . APPENDECTOMY    . CORONARY ANGIOPLASTY    . TONSILLECTOMY      Current Medications: Outpatient Medications Prior to Visit  Medication Sig Dispense Refill  . aspirin 81 MG tablet Take 162 mg by mouth daily.     [name] Kitchen atorvastatin (LIPITOR) 40 MG tablet TAKE 1 TABLET BY MOUTH EVERY DAY 90 tablet 2  . cholecalciferol (VITAMIN D) 1000 UNITS tablet Take 1,000 Units by mouth 2 (two) times daily.    [name] Kitchen FIBER FORMULA PO Take 1 tablet by mouth daily.    . hydrochlorothiazide (HYDRODIURIL) 25 MG tablet TAKE 1 TABLET BY  MOUTH EVERY DAY 90 tablet 1  . levocetirizine (XYZAL) 5 MG tablet Take 5 mg by mouth as needed.     . Melatonin 1 MG CAPS Take 1 capsule by mouth daily.    . metoprolol succinate (TOPROL-XL) 50 MG 24 hr tablet TAKE 1 TABLET BY MOUTH EVERY DAY WITH OR RIGHT AFTER MEAL 90 tablet 1  . NIFEdipine (PROCARDIA-XL/ADALAT CC) 60 MG 24 hr tablet TAKE 1 TABLET BY MOUTH EVERY DAY--MYLAN BRAND ([id number]) 90 tablet 2  . nitroGLYCERIN (NITROSTAT) 0.4 MG SL tablet Place 1 tablet (0.4 mg total) under the tongue every 5 (five) minutes as needed for chest pain. 25 tablet 2  . meloxicam (MOBIC) 15 MG tablet Take 15 mg by mouth daily.     No facility-administered medications prior to visit.      Allergies:   Nifedipine; Tetanus toxoids; and Adhesive [tape]   Social History   Social History  . Marital status: Divorced    Spouse name: N/A  . Number of children: 1  . Years of education: N/A   Occupational History  . retired    Social History Main Topics  . Smoking status: Former Smoker    Types: Cigarettes    Quit date: 12/26/1991  . Smokeless tobacco: Never Used  .  Alcohol use No  . Drug use: Yes    Types: Methylphenidate  . Sexual activity: Not Asked   Other Topics Concern  . None   Social History Narrative  . None     Family History:  The patient's family history includes Cancer in her brother; Heart failure in her mother; Hypertension in her father and mother.   ROS:   Please see the history of present illness.    ROS All other systems reviewed and are negative.   PHYSICAL EXAM:   VS:  BP 140/84   Pulse (!) 55   Ht 5\' 3"  (1.6 m)   Wt 123 lb 12.8 oz (56.2 kg)   BMI 21.93 kg/m    GENERAL:  Well appearing WF in NAD HEENT:  PERRL, EOMI, sclera are clear. Oropharynx is clear. NECK:  No jugular venous distention, carotid upstroke brisk and symmetric, no bruits, no thyromegaly or adenopathy LUNGS:  Clear to auscultation bilaterally CHEST:  Unremarkable HEART:  RRR,  PMI not  displaced or sustained,S1 and S2 within normal limits, no S3, no S4: no clicks, no rubs, no murmurs ABD:  Soft, nontender. BS +, no masses or bruits. No hepatomegaly, no splenomegaly EXT:  2 + pulses throughout, no edema, no cyanosis no clubbing SKIN:  Warm and dry.  No rashes NEURO:  Alert and oriented x 3. Cranial nerves II through XII intact. PSYCH:  Cognitively intact    Wt Readings from Last 3 Encounters:  09/26/17 123 lb 12.8 oz (56.2 kg)  03/27/17 124 lb 3.2 oz (56.3 kg)  10/02/16 126 lb (57.2 kg)      Studies/Labs Reviewed:   EKG:  EKG is ordered today.  The ekg ordered today demonstrates NSR rate 55, RBBB. I have personally reviewed and interpreted this study.   Recent Labs: No results found for requested labs within last 8760 hours.   Lipid Panel    Component Value Date/Time   CHOL 146 08/22/2013 0946   TRIG 120.0 08/22/2013 0946   HDL 60.00 08/22/2013 0946   CHOLHDL 2 08/22/2013 0946   VLDL 24.0 08/22/2013 0946   LDLCALC 62 08/22/2013 0946    Additional studies/ records that were reviewed today include:  Labs from 07/28/16: cholesterol 156, triglycerides 42, LDL 83, HDL 65. CMET, TSH, CBC all normal.  Dated 08/06/17: cholesterol 145, triglycerides 67, HDL 59, LDL 73, CMET, CBC normal. TSH normal.   ASSESSMENT:    1. Coronary artery disease involving native coronary artery of native heart without angina pectoris   2. Essential hypertension   3. Hypercholesteremia   4. Aortic stenosis, mild      PLAN:  In order of problems listed above:  1. CAD. No significant angina. Continue ASA, beta blocker and statin.  2. Continue current BP and statin therapy. 3. Murmur is stable and no symptoms related to AS.     Medication Adjustments/Labs and Tests Ordered: Current medicines are reviewed at length with the patient today.  Concerns regarding medicines are outlined above.  Medication changes, Labs and Tests ordered today are listed in the Patient Instructions  below. Patient Instructions  Continue your current therapy  I will see you in 6 months.    Signed, John Vasconcelos Martinique, MD  09/26/2017 2:29 PM    [facility] [address] [place], [place], [address] [phone]

## 2017-09-26 ENCOUNTER — Ambulatory Visit (INDEPENDENT_AMBULATORY_CARE_PROVIDER_SITE_OTHER): Payer: Medicare Other | Admitting: Cardiology

## 2017-09-26 ENCOUNTER — Encounter: Payer: Self-pay | Admitting: Cardiology

## 2017-09-26 ENCOUNTER — Ambulatory Visit: Payer: Medicare Other | Admitting: Cardiology

## 2017-09-26 VITALS — BP 140/84 | HR 55 | Ht 63.0 in | Wt 123.8 lb

## 2017-09-26 DIAGNOSIS — I350 Nonrheumatic aortic (valve) stenosis: Secondary | ICD-10-CM | POA: Diagnosis not present

## 2017-09-26 DIAGNOSIS — I10 Essential (primary) hypertension: Secondary | ICD-10-CM

## 2017-09-26 DIAGNOSIS — I2510 Atherosclerotic heart disease of native coronary artery without angina pectoris: Secondary | ICD-10-CM | POA: Diagnosis not present

## 2017-09-26 DIAGNOSIS — E7800 Pure hypercholesterolemia, unspecified: Secondary | ICD-10-CM | POA: Diagnosis not present

## 2017-09-26 MED ORDER — MELOXICAM 15 MG PO TABS: 15.0000 mg | ORAL_TABLET | Freq: Every day | ORAL | 11 refills | Status: DC | PRN

## 2017-09-26 NOTE — Patient Instructions (Signed)
Continue your current therapy  I will see you in 6 months.   

## 2017-09-26 NOTE — Addendum Note (Signed)
Addended by: Kathyrn Lass on: 09/26/2017 02:42 PM   Modules accepted: Orders

## 2017-09-27 DIAGNOSIS — L821 Other seborrheic keratosis: Secondary | ICD-10-CM | POA: Diagnosis not present

## 2017-09-27 DIAGNOSIS — Z85828 Personal history of other malignant neoplasm of skin: Secondary | ICD-10-CM | POA: Diagnosis not present

## 2017-09-27 DIAGNOSIS — D692 Other nonthrombocytopenic purpura: Secondary | ICD-10-CM | POA: Diagnosis not present

## 2017-09-27 DIAGNOSIS — D1801 Hemangioma of skin and subcutaneous tissue: Secondary | ICD-10-CM | POA: Diagnosis not present

## 2017-10-31 ENCOUNTER — Other Ambulatory Visit: Payer: Self-pay | Admitting: Cardiology

## 2017-10-31 DIAGNOSIS — Z23 Encounter for immunization: Secondary | ICD-10-CM | POA: Diagnosis not present

## 2017-12-11 ENCOUNTER — Telehealth: Payer: Self-pay | Admitting: Cardiology

## 2017-12-11 MED ORDER — ASPIRIN 81 MG PO TABS: 162.0000 mg | ORAL_TABLET | Freq: Every day | ORAL | 3 refills | Status: DC

## 2017-12-11 NOTE — Telephone Encounter (Signed)
°*  STAT* If patient is at the pharmacy, call can be transferred to refill team.   1. Which medications need to be refilled? (please list name of each medication and dose if known) 81 mg Enteric Aspirin   2. Which pharmacy/location (including street and city if local pharmacy) is [facility] [address]    3. Do they need a 30 day or 90 day supply? 90 day supply w/3 refills   [name] will now pay for this medication and she needs a new prescription sent to the pharmacy

## 2017-12-11 NOTE — Telephone Encounter (Signed)
Per the request of the patient, [facility].

## 2018-01-03 ENCOUNTER — Other Ambulatory Visit: Payer: Self-pay | Admitting: Cardiology

## 2018-01-18 ENCOUNTER — Other Ambulatory Visit: Payer: Self-pay | Admitting: Cardiology

## 2018-01-18 NOTE — Telephone Encounter (Signed)
REFILL 

## 2018-03-15 ENCOUNTER — Encounter: Payer: Self-pay | Admitting: Cardiology

## 2018-03-25 NOTE — Progress Notes (Signed)
Cardiology Office Note    Date:  03/26/2018   ID:  [name], DOB Jun 22, 1929, MRN [id number]  PCP:  Erin Redwood, MD  Cardiologist:  Erin Martinique, MD    History of Present Illness:  [name] is a 84 y.o. female who has a history of coronary disease and is s/p prior angioplasty of the diagonal branch in 1992. She had stenting of the right coronary in 2005. She has a history of hypertension. She has a history of mild-moderate aortic stenosis by Echo in 2015. She has chronic back pain related to scoliosis.  She is now living at [facility].  On follow up today she really has no cardiac complaints. She has chronic back pain.  Uses Meloxicam on a prn basis. No chest pain, dyspnea, edema, or palpitations. Main activity is walking to dinner.   Past Medical History:  Diagnosis Date  . Anxiety and depression   . Aortic stenosis    a. 03/2014: Mild aortic stenosis by valve area and mean gradient though appeared more visually consistent with moderate AS.   [name] Kitchen CAD (coronary artery disease)    a. s/p prior PCI of D2 in 1992 b. stent to RCA in 2005  . Diverticula of colon   . GERD (gastroesophageal reflux disease)   . HTN (hypertension)   . Hypercholesteremia   . Lateral myocardial infarction ([place]) 1992  . Osteoarthritis   . Osteopenia   . Scoliosis     Past Surgical History:  Procedure Laterality Date  . ABDOMINAL HYSTERECTOMY    . APPENDECTOMY    . CORONARY ANGIOPLASTY    . TONSILLECTOMY      Current Medications: Outpatient Medications Prior to Visit  Medication Sig Dispense Refill  . aspirin 81 MG tablet Take 2 tablets (162 mg total) by mouth daily. 180 tablet 3  . atorvastatin (LIPITOR) 40 MG tablet TAKE 1 TABLET BY MOUTH EVERY DAY 90 tablet 2  . cholecalciferol (VITAMIN D) 1000 UNITS tablet Take 1,000 Units by mouth 2 (two) times daily.    [name] Kitchen FIBER FORMULA PO Take 1 tablet by mouth daily.    . hydrochlorothiazide (HYDRODIURIL) 25 MG tablet TAKE 1 TABLET BY MOUTH  EVERY DAY 90 tablet 1  . levocetirizine (XYZAL) 5 MG tablet Take 5 mg by mouth as needed.     . Melatonin 1 MG CAPS Take 1 capsule by mouth daily.    . meloxicam (MOBIC) 15 MG tablet Take 1 tablet (15 mg total) by mouth daily as needed for pain. 30 tablet 11  . metoprolol succinate (TOPROL-XL) 50 MG 24 hr tablet TAKE 1 TABLET BY MOUTH EVERY DAY WITH OR RIGHT AFTER MEAL 90 tablet 1  . NIFEdipine (PROCARDIA-XL/ADALAT CC) 60 MG 24 hr tablet TAKE 1 TABLET BY MOUTH EVERY DAY--MYLAN BRAND ([id number]) 90 tablet 1  . nitroGLYCERIN (NITROSTAT) 0.4 MG SL tablet Place 1 tablet (0.4 mg total) under the tongue every 5 (five) minutes as needed for chest pain. 25 tablet 2   No facility-administered medications prior to visit.      Allergies:   Nifedipine; Tetanus toxoids; and Adhesive [tape]   Social History   Socioeconomic History  . Marital status: Divorced    Spouse name: Not on file  . Number of children: 1  . Years of education: Not on file  . Highest education level: Not on file  Occupational History  . Occupation: retired  Scientific laboratory technician  . Financial resource strain: Not on file  . Food insecurity:  Worry: Not on file    Inability: Not on file  . Transportation needs:    Medical: Not on file    Non-medical: Not on file  Tobacco Use  . Smoking status: Former Smoker    Types: Cigarettes    Last attempt to quit: 12/26/1991    Years since quitting: 26.2  . Smokeless tobacco: Never Used  Substance and Sexual Activity  . Alcohol use: No    Alcohol/week: 0.0 oz  . Drug use: Yes    Types: Methylphenidate  . Sexual activity: Not on file  Lifestyle  . Physical activity:    Days per week: Not on file    Minutes per session: Not on file  . Stress: Not on file  Relationships  . Social connections:    Talks on phone: Not on file    Gets together: Not on file    Attends religious service: Not on file    Active member of club or organization: Not on file    Attends meetings of clubs  or organizations: Not on file    Relationship status: Not on file  Other Topics Concern  . Not on file  Social History Narrative  . Not on file     Family History:  The patient's family history includes Cancer in her brother; Heart failure in her mother; Hypertension in her father and mother.   ROS:   Please see the history of present illness.    ROS All other systems reviewed and are negative.   PHYSICAL EXAM:   VS:  BP (!) 155/76   Pulse (!) 53   Ht 5\' 3"  (1.6 m)   Wt 124 lb (56.2 kg)   BMI 21.97 kg/m    GENERAL:  Well appearing, elderly WF in NAD HEENT:  PERRL, EOMI, sclera are clear. Oropharynx is clear. NECK:  No jugular venous distention, carotid upstroke brisk and symmetric, no bruits, no thyromegaly or adenopathy LUNGS:  Clear to auscultation bilaterally CHEST:  Unremarkable HEART:  RRR,  PMI not displaced or sustained,S1 and S2 within normal limits, no S3, no S4: no clicks, no rubs, gr 2/6 harsh systolic murmur RUSB.  EXT:  2 + pulses throughout, no edema, no cyanosis no clubbing SKIN:  Warm and dry.  No rashes NEURO:  Alert and oriented x 3. Cranial nerves II through XII intact. PSYCH:  Cognitively intact      Wt Readings from Last 3 Encounters:  03/26/18 124 lb (56.2 kg)  09/26/17 123 lb 12.8 oz (56.2 kg)  03/27/17 124 lb 3.2 oz (56.3 kg)      Studies/Labs Reviewed:    Recent Labs: No results found for requested labs within last 8760 hours.   Lipid Panel    Component Value Date/Time   CHOL 146 08/22/2013 0946   TRIG 120.0 08/22/2013 0946   HDL 60.00 08/22/2013 0946   CHOLHDL 2 08/22/2013 0946   VLDL 24.0 08/22/2013 0946   LDLCALC 62 08/22/2013 0946    Additional studies/ records that were reviewed today include:  Labs from 07/28/16: cholesterol 156, triglycerides 42, LDL 83, HDL 65. CMET, TSH, CBC all normal.  Dated 08/06/17: cholesterol 145, triglycerides 67, HDL 59, LDL 73, CMET, CBC normal. TSH normal.   ASSESSMENT:    1. Coronary artery  disease involving native coronary artery of native heart without angina pectoris   2. Essential hypertension   3. Hypercholesteremia   4. Nonrheumatic aortic valve stenosis      PLAN:  In order of  problems listed above:  1. CAD. No significant angina. Continue ASA, beta blocker and statin.  2. Continue current BP and statin therapy. 3. Moderate stenosis stable by exam and asymptomatic.   Follow up in 6 months.   Signed, Erin Martinique, MD  03/26/2018 3:58 PM    [facility] [address] [place], [place], [address] [phone]

## 2018-03-26 ENCOUNTER — Ambulatory Visit: Payer: Medicare Other | Admitting: Cardiology

## 2018-03-26 ENCOUNTER — Ambulatory Visit (INDEPENDENT_AMBULATORY_CARE_PROVIDER_SITE_OTHER): Payer: Medicare Other | Admitting: Cardiology

## 2018-03-26 ENCOUNTER — Encounter: Payer: Self-pay | Admitting: Cardiology

## 2018-03-26 VITALS — BP 155/76 | HR 53 | Ht 63.0 in | Wt 124.0 lb

## 2018-03-26 DIAGNOSIS — I2510 Atherosclerotic heart disease of native coronary artery without angina pectoris: Secondary | ICD-10-CM

## 2018-03-26 DIAGNOSIS — E7800 Pure hypercholesterolemia, unspecified: Secondary | ICD-10-CM

## 2018-03-26 DIAGNOSIS — I10 Essential (primary) hypertension: Secondary | ICD-10-CM

## 2018-03-26 DIAGNOSIS — I350 Nonrheumatic aortic (valve) stenosis: Secondary | ICD-10-CM

## 2018-03-26 NOTE — Patient Instructions (Signed)
Continue your current therapy  I will see you in one year   

## 2018-05-05 ENCOUNTER — Other Ambulatory Visit: Payer: Self-pay | Admitting: Cardiology

## 2018-05-06 NOTE — Telephone Encounter (Signed)
REFILL 

## 2018-06-20 ENCOUNTER — Encounter: Payer: Self-pay | Admitting: Cardiology

## 2018-06-20 DIAGNOSIS — M8589 Other specified disorders of bone density and structure, multiple sites: Secondary | ICD-10-CM | POA: Diagnosis not present

## 2018-06-20 DIAGNOSIS — M810 Age-related osteoporosis without current pathological fracture: Secondary | ICD-10-CM | POA: Diagnosis not present

## 2018-06-20 DIAGNOSIS — Z1231 Encounter for screening mammogram for malignant neoplasm of breast: Secondary | ICD-10-CM | POA: Diagnosis not present

## 2018-07-05 ENCOUNTER — Other Ambulatory Visit: Payer: Self-pay | Admitting: Cardiology

## 2018-07-08 NOTE — Telephone Encounter (Signed)
Rx has been sent to the pharmacy electronically. ° °

## 2018-07-25 ENCOUNTER — Other Ambulatory Visit: Payer: Self-pay | Admitting: Cardiology

## 2018-08-12 DIAGNOSIS — R82998 Other abnormal findings in urine: Secondary | ICD-10-CM | POA: Diagnosis not present

## 2018-08-12 DIAGNOSIS — I10 Essential (primary) hypertension: Secondary | ICD-10-CM | POA: Diagnosis not present

## 2018-08-12 DIAGNOSIS — M810 Age-related osteoporosis without current pathological fracture: Secondary | ICD-10-CM | POA: Diagnosis not present

## 2018-08-12 DIAGNOSIS — E7849 Other hyperlipidemia: Secondary | ICD-10-CM | POA: Diagnosis not present

## 2018-08-19 DIAGNOSIS — I350 Nonrheumatic aortic (valve) stenosis: Secondary | ICD-10-CM | POA: Diagnosis not present

## 2018-08-19 DIAGNOSIS — Z6823 Body mass index (BMI) 23.0-23.9, adult: Secondary | ICD-10-CM | POA: Diagnosis not present

## 2018-08-19 DIAGNOSIS — Z0000 Encounter for general adult medical examination without abnormal findings: Secondary | ICD-10-CM | POA: Diagnosis not present

## 2018-08-19 DIAGNOSIS — M4800 Spinal stenosis, site unspecified: Secondary | ICD-10-CM | POA: Diagnosis not present

## 2018-08-19 DIAGNOSIS — E7849 Other hyperlipidemia: Secondary | ICD-10-CM | POA: Diagnosis not present

## 2018-08-19 DIAGNOSIS — M810 Age-related osteoporosis without current pathological fracture: Secondary | ICD-10-CM | POA: Diagnosis not present

## 2018-08-19 DIAGNOSIS — I10 Essential (primary) hypertension: Secondary | ICD-10-CM | POA: Diagnosis not present

## 2018-08-19 DIAGNOSIS — Z1389 Encounter for screening for other disorder: Secondary | ICD-10-CM | POA: Diagnosis not present

## 2018-08-19 DIAGNOSIS — F418 Other specified anxiety disorders: Secondary | ICD-10-CM | POA: Diagnosis not present

## 2018-08-19 DIAGNOSIS — Z23 Encounter for immunization: Secondary | ICD-10-CM | POA: Diagnosis not present

## 2018-08-23 DIAGNOSIS — Z1212 Encounter for screening for malignant neoplasm of rectum: Secondary | ICD-10-CM | POA: Diagnosis not present

## 2018-09-23 NOTE — Progress Notes (Signed)
Cardiology Office Note    Date:  09/25/2018   ID:  [name], DOB February 24, 1929, MRN [id number]  PCP:  [name], MD  Cardiologist:  [name], MD    History of Present Illness:  [name] is a 84 y.o. female who has a history of coronary disease and is s/p prior angioplasty of the diagonal branch in 1992. She had stenting of the right coronary in 2005. She has a history of hypertension. She has a history of mild-moderate aortic stenosis by Echo in 2015. She has chronic back pain related to scoliosis.  She is now living at [facility].  On follow up today she really has no cardiac complaints. She had one episode of chest pain since her last visit relieved with sl Ntg. She has chronic back pain.  Uses Meloxicam on a prn basis. No other chest pain, dyspnea, edema, or palpitations. No dizziness.  Main activity is walking to dinner.   Past Medical History:  Diagnosis Date  . Anxiety and depression   . Aortic stenosis    a. 03/2014: Mild aortic stenosis by valve area and mean gradient though appeared more visually consistent with moderate AS.   [name] Kitchen CAD (coronary artery disease)    a. s/p prior PCI of D2 in 1992 b. stent to RCA in 2005  . Diverticula of colon   . GERD (gastroesophageal reflux disease)   . HTN (hypertension)   . Hypercholesteremia   . Lateral myocardial infarction ([place]) 1992  . Osteoarthritis   . Osteopenia   . Scoliosis     Past Surgical History:  Procedure Laterality Date  . ABDOMINAL HYSTERECTOMY    . APPENDECTOMY    . CORONARY ANGIOPLASTY    . TONSILLECTOMY      Current Medications: Outpatient Medications Prior to Visit  Medication Sig Dispense Refill  . aspirin 81 MG tablet Take 2 tablets (162 mg total) by mouth daily. 180 tablet 3  . atorvastatin (LIPITOR) 40 MG tablet TAKE 1 TABLET BY MOUTH EVERY DAY 90 tablet 2  . cholecalciferol (VITAMIN D) 1000 UNITS tablet Take 1,000 Units by mouth 2 (two) times daily.    [name] Kitchen FIBER FORMULA PO Take 1  tablet by mouth daily.    . hydrochlorothiazide (HYDRODIURIL) 25 MG tablet TAKE 1 TABLET BY MOUTH EVERY DAY 90 tablet 1  . levocetirizine (XYZAL) 5 MG tablet Take 5 mg by mouth as needed.     . Melatonin 1 MG CAPS Take 1 capsule by mouth daily.    . meloxicam (MOBIC) 15 MG tablet Take 1 tablet (15 mg total) by mouth daily as needed for pain. 30 tablet 11  . metoprolol succinate (TOPROL-XL) 50 MG 24 hr tablet TAKE 1 TABLET BY MOUTH EVERY DAY WITH OR RIGHT AFTER MEAL (Patient taking differently: Take 50 mg by mouth daily. Take 0.5-1 tablet daily) 90 tablet 1  . NIFEdipine (PROCARDIA-XL/ADALAT CC) 60 MG 24 hr tablet TAKE 1 TABLET BY MOUTH EVERY DAY--MYLAN BRAND ([id number]) 90 tablet 1  . nitroGLYCERIN (NITROSTAT) 0.4 MG SL tablet Place 1 tablet (0.4 mg total) under the tongue every 5 (five) minutes as needed for chest pain. 25 tablet 2   No facility-administered medications prior to visit.      Allergies:   Nifedipine; Tetanus toxoids; and Adhesive [tape]   Social History   Socioeconomic History  . Marital status: Divorced    Spouse name: Not on file  . Number of children: 1  . Years of education: Not  on file  . Highest education level: Not on file  Occupational History  . Occupation: retired  Scientific laboratory technician  . Financial resource strain: Not on file  . Food insecurity:    Worry: Not on file    Inability: Not on file  . Transportation needs:    Medical: Not on file    Non-medical: Not on file  Tobacco Use  . Smoking status: Former Smoker    Types: Cigarettes    Last attempt to quit: 12/26/1991    Years since quitting: 26.7  . Smokeless tobacco: Never Used  Substance and Sexual Activity  . Alcohol use: No    Alcohol/week: 0.0 standard drinks  . Drug use: Yes    Types: Methylphenidate  . Sexual activity: Not on file  Lifestyle  . Physical activity:    Days per week: Not on file    Minutes per session: Not on file  . Stress: Not on file  Relationships  . Social connections:     Talks on phone: Not on file    Gets together: Not on file    Attends religious service: Not on file    Active member of club or organization: Not on file    Attends meetings of clubs or organizations: Not on file    Relationship status: Not on file  Other Topics Concern  . Not on file  Social History Narrative  . Not on file     Family History:  The patient's family history includes Cancer in her brother; Heart failure in her mother; Hypertension in her father and mother.   ROS:   Please see the history of present illness.    ROS All other systems reviewed and are negative.   PHYSICAL EXAM:   VS:  BP 136/64   Pulse (!) 53   Ht 5\' 3"  (1.6 m)   Wt 123 lb 9.6 oz (56.1 kg)   BMI 21.89 kg/m    GENERAL:  Well appearing, elderly WF in NAD HEENT:  PERRL, EOMI, sclera are clear. Oropharynx is clear. NECK:  No jugular venous distention, carotid upstroke brisk and symmetric, no bruits, no thyromegaly or adenopathy LUNGS:  Clear to auscultation bilaterally CHEST:  Unremarkable HEART:  RRR,  PMI not displaced or sustained,S1 and S2 within normal limits, no S3, no S4: no clicks, no rubs, gr 2/6 harsh systolic murmur RUSB radiates to carotids. EXT:  2 + pulses throughout, no edema, no cyanosis no clubbing SKIN:  Warm and dry.  No rashes NEURO:  Alert and oriented x 3. Cranial nerves II through XII intact. PSYCH:  Cognitively intact      Wt Readings from Last 3 Encounters:  09/25/18 123 lb 9.6 oz (56.1 kg)  03/26/18 124 lb (56.2 kg)  09/26/17 123 lb 12.8 oz (56.2 kg)      Studies/Labs Reviewed:    Recent Labs: No results found for requested labs within last 8760 hours.   Lipid Panel    Component Value Date/Time   CHOL 146 08/22/2013 0946   TRIG 120.0 08/22/2013 0946   HDL 60.00 08/22/2013 0946   CHOLHDL 2 08/22/2013 0946   VLDL 24.0 08/22/2013 0946   LDLCALC 62 08/22/2013 0946    Additional studies/ records that were reviewed today include:  Labs from 07/28/16:  cholesterol 156, triglycerides 42, LDL 83, HDL 65. CMET, TSH, CBC all normal.  Dated 08/06/17: cholesterol 145, triglycerides 67, HDL 59, LDL 73, CMET, CBC normal. TSH normal.  Dated 08/12/18: cholesterol 150, triglycerides 72,  HDL 65, LDL 71. Chemistries, Hgb, and TSH normal.  Ecg today shows NSR with RBBB. No change. I have personally reviewed and interpreted this study.   ASSESSMENT:    1. Coronary artery disease involving native coronary artery of native heart without angina pectoris   2. Nonrheumatic aortic valve stenosis   3. Essential hypertension      PLAN:  In order of problems listed above:  1. CAD. No significant angina. Continue ASA, beta blocker and statin.  2. Recommend follow up Echo since last was 4.5 years ago.  3. Bp and lipids are well controlled.   Follow up in 6 months.   Signed, [name], MD  09/25/2018 3:05 PM    [facility] [address], [place], [place], [address] [phone]

## 2018-09-25 ENCOUNTER — Ambulatory Visit (INDEPENDENT_AMBULATORY_CARE_PROVIDER_SITE_OTHER): Payer: Medicare Other | Admitting: Cardiology

## 2018-09-25 ENCOUNTER — Encounter: Payer: Self-pay | Admitting: Cardiology

## 2018-09-25 VITALS — BP 136/64 | HR 53 | Ht 63.0 in | Wt 123.6 lb

## 2018-09-25 DIAGNOSIS — I10 Essential (primary) hypertension: Secondary | ICD-10-CM | POA: Diagnosis not present

## 2018-09-25 DIAGNOSIS — I350 Nonrheumatic aortic (valve) stenosis: Secondary | ICD-10-CM

## 2018-09-25 DIAGNOSIS — I2510 Atherosclerotic heart disease of native coronary artery without angina pectoris: Secondary | ICD-10-CM

## 2018-09-25 NOTE — Patient Instructions (Signed)
Continue your current therapy  We will schedule you for a follow up Echo.

## 2018-09-30 ENCOUNTER — Ambulatory Visit (HOSPITAL_COMMUNITY): Payer: Medicare Other | Attending: Cardiology

## 2018-09-30 ENCOUNTER — Other Ambulatory Visit: Payer: Self-pay

## 2018-09-30 DIAGNOSIS — G8929 Other chronic pain: Secondary | ICD-10-CM | POA: Insufficient documentation

## 2018-09-30 DIAGNOSIS — E785 Hyperlipidemia, unspecified: Secondary | ICD-10-CM | POA: Insufficient documentation

## 2018-09-30 DIAGNOSIS — I350 Nonrheumatic aortic (valve) stenosis: Secondary | ICD-10-CM | POA: Diagnosis not present

## 2018-09-30 DIAGNOSIS — I10 Essential (primary) hypertension: Secondary | ICD-10-CM | POA: Diagnosis not present

## 2018-09-30 DIAGNOSIS — I2510 Atherosclerotic heart disease of native coronary artery without angina pectoris: Secondary | ICD-10-CM

## 2018-10-28 ENCOUNTER — Telehealth: Payer: Self-pay | Admitting: Cardiology

## 2018-10-28 MED ORDER — AMLODIPINE BESYLATE 5 MG PO TABS: 5.0000 mg | ORAL_TABLET | Freq: Every day | ORAL | 3 refills | Status: DC

## 2018-10-28 NOTE — Telephone Encounter (Signed)
° ° °  Pt c/o medication issue:  1. Name of Medication: NIFEdipine (PROCARDIA-XL/ADALAT CC) 60 MG 24 hr tablet  2. How are you currently taking this medication (dosage and times per day)?   3. Are you having a reaction (difficulty breathing--STAT)? NO  4. What is your medication issue? Medication on back order, requesting change to Amlodipine or Selodipine, [facility]; 3 refills

## 2018-10-28 NOTE — Telephone Encounter (Signed)
Can switch nifedipine to amlodipine 5 mg daily  [name] MD, [facility]

## 2018-10-28 NOTE — Telephone Encounter (Signed)
Spoke to patient Dr.Jordan's recommendation given.[facility].

## 2018-11-27 ENCOUNTER — Telehealth: Payer: Self-pay | Admitting: Cardiology

## 2018-11-27 NOTE — Telephone Encounter (Signed)
[facility], spoke with pharm tech, advised of switch in  November to Norvasc. No other questions. Pharm tech verbalized understanding.

## 2018-11-27 NOTE — Telephone Encounter (Signed)
New message   Pt c/o medication issue:  1. Name of Medication:   amLODipine (NORVASC) 5 MG tablet Take 1 tablet (5 mg total) by mouth daily.   aspirin 81 MG tablet    Nifedipincc  2. How are you currently taking this medication (dosage and times per day)? N/a  3. Are you having a reaction (difficulty breathing--STAT)? n/a  4. What is your medication issue? [facility] which medication is the correct medication that the patient should be taking? Please advise.

## 2019-03-11 ENCOUNTER — Telehealth: Payer: Self-pay | Admitting: Cardiology

## 2019-03-11 MED ORDER — HYDROCHLOROTHIAZIDE 25 MG PO TABS: 25.0000 mg | ORAL_TABLET | Freq: Every day | ORAL | 2 refills | Status: DC

## 2019-03-11 NOTE — Telephone Encounter (Signed)
New  Message:    Patient calling concerning some medication issue please call patient.

## 2019-03-11 NOTE — Telephone Encounter (Signed)
Pt calling requesting that we contact [facility] to go over her medication list. Advised pt that if the pharmacy has any concerns, to have them contact our office. Pt verbalized understanding. Pt also requesting for HCTZ refill. Rx sent.

## 2019-04-08 ENCOUNTER — Ambulatory Visit: Payer: Medicare Other | Admitting: Cardiology

## 2019-05-23 ENCOUNTER — Telehealth: Payer: Self-pay | Admitting: Cardiology

## 2019-05-23 MED ORDER — HYDROCHLOROTHIAZIDE 25 MG PO TABS: 25.0000 mg | ORAL_TABLET | Freq: Every day | ORAL | 3 refills | Status: DC

## 2019-05-23 MED ORDER — METOPROLOL SUCCINATE ER 50 MG PO TB24: ORAL_TABLET | ORAL | 3 refills | Status: DC

## 2019-05-23 MED ORDER — AMLODIPINE BESYLATE 5 MG PO TABS: 5.0000 mg | ORAL_TABLET | Freq: Every day | ORAL | 3 refills | Status: DC

## 2019-05-23 MED ORDER — ATORVASTATIN CALCIUM 40 MG PO TABS: 40.0000 mg | ORAL_TABLET | Freq: Every day | ORAL | 3 refills | Status: DC

## 2019-05-23 NOTE — Telephone Encounter (Signed)
Returned call to patient she requested 90 day refills on medications.She also requested Dr.Jordan's name on her prescriptions.Refills [facility] to [facility].

## 2019-05-23 NOTE — Telephone Encounter (Signed)
New Message:     Please call, concerning her medicine. 

## 2019-08-14 ENCOUNTER — Telehealth: Payer: Self-pay | Admitting: Cardiology

## 2019-08-14 NOTE — Telephone Encounter (Signed)
New Message:    Pt's sister is calling. Pt has an appointment with Dr Martinique on 08-20-19. She would like to come in with pt please. Pt needs help getting around and she have problems with remembering .wid

## 2019-08-14 NOTE — Telephone Encounter (Signed)
Spoke to patient's sister Mardene Celeste she stated she needs to come back with patient at her appointment 8/26.Stated patient is having trouble remembering.Advised she can come back.Advised to have patient sign hippa form giving [place] permission to discuss her care with you.

## 2019-08-19 NOTE — Progress Notes (Signed)
Cardiology Office Note    Date:  08/20/2019   ID:  RAYLA PIECHOTA, DOB 04-22-1929, MRN [id number]  PCP:  [name], MD  Cardiologist:  Clayten Allcock Martinique, MD    History of Present Illness:  [name] is a 85 y.o. female who has a history of coronary disease and is s/p prior angioplasty of the diagonal branch in 1992. She had stenting of the right coronary in 2005. She has a history of hypertension. She has a history of mild-moderate aortic stenosis by Echo in 2015. Repeat in October 2019 was unchanged. She has chronic back pain related to scoliosis.  She is now living at [facility].  On follow up today she is seen with her sister. She has had two episodes of chest pain since her last visit relieved with sl Ntg. She has chronic back pain.  Uses Meloxicam on a prn basis. She is limited with her activity. Is under a lot of stress. Not particularly happy with move to [facility]. No other chest pain, dyspnea, edema, or palpitations. No dizziness.   Past Medical History:  Diagnosis Date  . Anxiety and depression   . Aortic stenosis    a. 03/2014: Mild aortic stenosis by valve area and mean gradient though appeared more visually consistent with moderate AS.   [name] Kitchen CAD (coronary artery disease)    a. s/p prior PCI of D2 in 1992 b. stent to RCA in 2005  . Diverticula of colon   . GERD (gastroesophageal reflux disease)   . HTN (hypertension)   . Hypercholesteremia   . Lateral myocardial infarction ([place]) 1992  . Osteoarthritis   . Osteopenia   . Scoliosis     Past Surgical History:  Procedure Laterality Date  . ABDOMINAL HYSTERECTOMY    . APPENDECTOMY    . CORONARY ANGIOPLASTY    . TONSILLECTOMY      Current Medications: Outpatient Medications Prior to Visit  Medication Sig Dispense Refill  . amLODipine (NORVASC) 5 MG tablet Take 1 tablet (5 mg total) by mouth daily. 90 tablet 3  . aspirin 81 MG tablet Take 2 tablets (162 mg total) by mouth daily. 180 tablet 3  . atorvastatin  (LIPITOR) 40 MG tablet Take 1 tablet (40 mg total) by mouth daily. 90 tablet 3  . cholecalciferol (VITAMIN D) 1000 UNITS tablet Take 1,000 Units by mouth 2 (two) times daily.    [name] Kitchen FIBER FORMULA PO Take 1 tablet by mouth daily.    [name] Kitchen levocetirizine (XYZAL) 5 MG tablet Take 5 mg by mouth as needed.     . Melatonin 1 MG CAPS Take 1 capsule by mouth daily.    . metoprolol succinate (TOPROL-XL) 50 MG 24 hr tablet TAKE 1 TABLET BY MOUTH EVERY DAY WITH OR RIGHT AFTER MEAL 90 tablet 3  . nitroGLYCERIN (NITROSTAT) 0.4 MG SL tablet Place 1 tablet (0.4 mg total) under the tongue every 5 (five) minutes as needed for chest pain. 25 tablet 2  . hydrochlorothiazide (HYDRODIURIL) 25 MG tablet Take 1 tablet (25 mg total) by mouth daily. 90 tablet 3  . meloxicam (MOBIC) 15 MG tablet Take 1 tablet (15 mg total) by mouth daily as needed for pain. 30 tablet 11   No facility-administered medications prior to visit.      Allergies:   Nifedipine, Tetanus toxoids, and Adhesive [tape]   Social History   Socioeconomic History  . Marital status: Divorced    Spouse name: Not on file  . Number of  children: 1  . Years of education: Not on file  . Highest education level: Not on file  Occupational History  . Occupation: retired  Scientific laboratory technician  . Financial resource strain: Not on file  . Food insecurity    Worry: Not on file    Inability: Not on file  . Transportation needs    Medical: Not on file    Non-medical: Not on file  Tobacco Use  . Smoking status: Former Smoker    Types: Cigarettes    Quit date: 12/26/1991    Years since quitting: 27.6  . Smokeless tobacco: Never Used  Substance and Sexual Activity  . Alcohol use: No    Alcohol/week: 0.0 standard drinks  . Drug use: Yes    Types: Methylphenidate  . Sexual activity: Not on file  Lifestyle  . Physical activity    Days per week: Not on file    Minutes per session: Not on file  . Stress: Not on file  Relationships  . Social Herbalist  on phone: Not on file    Gets together: Not on file    Attends religious service: Not on file    Active member of club or organization: Not on file    Attends meetings of clubs or organizations: Not on file    Relationship status: Not on file  Other Topics Concern  . Not on file  Social History Narrative  . Not on file     Family History:  The patient's family history includes Cancer in her brother; Heart failure in her mother; Hypertension in her father and mother.   ROS:   Please see the history of present illness.    ROS All other systems reviewed and are negative.   PHYSICAL EXAM:   VS:  BP (!) 190/94   Pulse (!) 52   Temp (!) 97.3 F (36.3 C)   Ht 5\' 3"  (1.6 m)   Wt 119 lb (54 kg)   SpO2 94%   BMI 21.08 kg/m    GENERAL:  Well appearing, elderly WF in NAD HEENT:  PERRL, EOMI, sclera are clear. Oropharynx is clear. NECK:  No jugular venous distention, carotid upstroke brisk and symmetric, no bruits, no thyromegaly or adenopathy LUNGS:  Clear to auscultation bilaterally CHEST:  Unremarkable HEART:  RRR,  PMI not displaced or sustained,S1 and S2 within normal limits, no S3, no S4: no clicks, no rubs, gr 2/6 harsh systolic murmur RUSB radiates to carotids. EXT:  2 + pulses throughout, no edema, no cyanosis no clubbing SKIN:  Warm and dry.  No rashes NEURO:  Alert and oriented x 3. Cranial nerves II through XII intact. PSYCH:  Cognitively intact      Wt Readings from Last 3 Encounters:  08/20/19 119 lb (54 kg)  09/25/18 123 lb 9.6 oz (56.1 kg)  03/26/18 124 lb (56.2 kg)      Studies/Labs Reviewed:    Recent Labs: No results found for requested labs within last 8760 hours.   Lipid Panel    Component Value Date/Time   CHOL 146 08/22/2013 0946   TRIG 120.0 08/22/2013 0946   HDL 60.00 08/22/2013 0946   CHOLHDL 2 08/22/2013 0946   VLDL 24.0 08/22/2013 0946   LDLCALC 62 08/22/2013 0946    Additional studies/ records that were reviewed today include:  Labs  from 07/28/16: cholesterol 156, triglycerides 42, LDL 83, HDL 65. CMET, TSH, CBC all normal.  Dated 08/06/17: cholesterol 145, triglycerides 67, HDL  59, LDL 73, CMET, CBC normal. TSH normal.  Dated 08/12/18: cholesterol 150, triglycerides 72, HDL 65, LDL 71. Chemistries, Hgb, and TSH normal.  Ecg today shows NSR with RBBB. No change. I have personally reviewed and interpreted this study.  Echo 09/30/18: Study Conclusions  - Left ventricle: The cavity size was normal. Wall thickness was   increased in a pattern of moderate LVH. There was focal basal   hypertrophy. Systolic function was normal. The estimated ejection   fraction was in the range of 60% to 65%. Wall motion was normal;   there were no regional wall motion abnormalities. - Aortic valve: Moderately calcified annulus. Moderately thickened,   moderately calcified leaflets. There was mild to moderate   stenosis. Valve area (VTI): 1.39 cm^2. Valve area (Vmax): 1.29   cm^2. Valve area (Vmean): 1.12 cm^2. - Pulmonary arteries: PA peak pressure: 31 mm Hg (S).   ASSESSMENT:    1. Coronary artery disease of native artery of native heart with stable angina pectoris ([place])   2. Nonrheumatic aortic valve stenosis   3. Hypercholesteremia   4. Essential hypertension      PLAN:  In order of problems listed above:  1. CAD. She has stable and infrequent angina. Continue ASA, beta blocker, amlodipine and statin.  2. Mild to moderate aortic stenosis. No change on last Echo 3. HTN. BP is quite high. Will add losartan 50 mg daily. Also on HCTZ, Toprol and amlodipine.  4. HLD - due to see Dr Brigitte Pulse back but she cancelled her visit. We will check fasting labs after starting losartan.   Follow up in 6 months.   Signed, Viktoriya Glaspy Martinique, MD  08/20/2019 2:34 PM    [facility] [address] [place], [place], [address] [phone]

## 2019-08-20 ENCOUNTER — Ambulatory Visit (INDEPENDENT_AMBULATORY_CARE_PROVIDER_SITE_OTHER): Payer: Medicare Other | Admitting: Cardiology

## 2019-08-20 ENCOUNTER — Other Ambulatory Visit: Payer: Self-pay

## 2019-08-20 ENCOUNTER — Encounter: Payer: Self-pay | Admitting: Cardiology

## 2019-08-20 VITALS — BP 190/94 | HR 52 | Temp 97.3°F | Ht 63.0 in | Wt 119.0 lb

## 2019-08-20 DIAGNOSIS — I350 Nonrheumatic aortic (valve) stenosis: Secondary | ICD-10-CM

## 2019-08-20 DIAGNOSIS — I25118 Atherosclerotic heart disease of native coronary artery with other forms of angina pectoris: Secondary | ICD-10-CM

## 2019-08-20 DIAGNOSIS — I10 Essential (primary) hypertension: Secondary | ICD-10-CM | POA: Diagnosis not present

## 2019-08-20 DIAGNOSIS — E7800 Pure hypercholesterolemia, unspecified: Secondary | ICD-10-CM | POA: Diagnosis not present

## 2019-08-20 MED ORDER — LOSARTAN POTASSIUM 50 MG PO TABS: 50.0000 mg | ORAL_TABLET | Freq: Every day | ORAL | 3 refills | Status: DC

## 2019-08-20 MED ORDER — HYDROCHLOROTHIAZIDE 25 MG PO TABS: 25.0000 mg | ORAL_TABLET | Freq: Every day | ORAL | 3 refills | Status: DC

## 2019-08-20 NOTE — Patient Instructions (Signed)
Add losartan 50 mg daily for blood pressure. Continue your current therapy  We will need to get blood work in 3 weeks.

## 2019-09-25 ENCOUNTER — Telehealth: Payer: Self-pay

## 2019-09-25 DIAGNOSIS — I25118 Atherosclerotic heart disease of native coronary artery with other forms of angina pectoris: Secondary | ICD-10-CM | POA: Diagnosis not present

## 2019-09-25 DIAGNOSIS — I350 Nonrheumatic aortic (valve) stenosis: Secondary | ICD-10-CM | POA: Diagnosis not present

## 2019-09-25 DIAGNOSIS — E7800 Pure hypercholesterolemia, unspecified: Secondary | ICD-10-CM | POA: Diagnosis not present

## 2019-09-25 DIAGNOSIS — I10 Essential (primary) hypertension: Secondary | ICD-10-CM | POA: Diagnosis not present

## 2019-09-25 LAB — COMPREHENSIVE METABOLIC PANEL
ALT: 14 IU/L (ref 0–32)
AST: 20 IU/L (ref 0–40)
Albumin/Globulin Ratio: 1.4 (ref 1.2–2.2)
Albumin: 3.8 g/dL (ref 3.6–4.6)
Alkaline Phosphatase: 93 IU/L (ref 39–117)
BUN/Creatinine Ratio: 17 (ref 12–28)
BUN: 11 mg/dL (ref 8–27)
Bilirubin Total: 0.6 mg/dL (ref 0.0–1.2)
CO2: 26 mmol/L (ref 20–29)
Calcium: 9 mg/dL (ref 8.7–10.3)
Chloride: 104 mmol/L (ref 96–106)
Creatinine, Ser: 0.64 mg/dL (ref 0.57–1.00)
GFR calc Af Amer: 93 mL/min/{1.73_m2} (ref >59)
GFR calc non Af Amer: 80 mL/min/{1.73_m2} (ref >59)
Globulin, Total: 2.8 g/dL (ref 1.5–4.5)
Glucose: 87 mg/dL (ref 65–99)
Potassium: 4.2 mmol/L (ref 3.5–5.2)
Sodium: 141 mmol/L (ref 134–144)
Total Protein: 6.6 g/dL (ref 6.0–8.5)

## 2019-09-25 LAB — CBC WITH DIFFERENTIAL/PLATELET
Basophils Absolute: 0 10*3/uL (ref 0.0–0.2)
Basos: 1 %
EOS (ABSOLUTE): 0.1 10*3/uL (ref 0.0–0.4)
Eos: 3 %
Hematocrit: 42.1 % (ref 34.0–46.6)
Hemoglobin: 13.8 g/dL (ref 11.1–15.9)
Immature Grans (Abs): 0 10*3/uL (ref 0.0–0.1)
Immature Granulocytes: 0 %
Lymphocytes Absolute: 1 10*3/uL (ref 0.7–3.1)
Lymphs: 24 %
MCH: 29.2 pg (ref 26.6–33.0)
MCHC: 32.8 g/dL (ref 31.5–35.7)
MCV: 89 fL (ref 79–97)
Monocytes Absolute: 0.6 10*3/uL (ref 0.1–0.9)
Monocytes: 15 %
Neutrophils Absolute: 2.5 10*3/uL (ref 1.4–7.0)
Neutrophils: 57 %
Platelets: 222 10*3/uL (ref 150–450)
RBC: 4.72 x10E6/uL (ref 3.77–5.28)
RDW: 13.5 % (ref 11.7–15.4)
WBC: 4.3 10*3/uL (ref 3.4–10.8)

## 2019-09-25 LAB — LIPID PANEL
Chol/HDL Ratio: 2 ratio (ref 0.0–4.4)
Cholesterol, Total: 145 mg/dL (ref 100–199)
HDL: 72 mg/dL (ref >39)
LDL Chol Calc (NIH): 61 mg/dL (ref 0–99)
Triglycerides: 60 mg/dL (ref 0–149)
VLDL Cholesterol Cal: 12 mg/dL (ref 5–40)

## 2019-09-25 LAB — TSH: TSH: 2.15 u[IU]/mL (ref 0.450–4.500)

## 2019-09-25 NOTE — Telephone Encounter (Signed)
Spoke to patient Dr.Jordan reviewed labs all normal except CBC pending.I will call back with those results.

## 2019-09-29 DIAGNOSIS — Z23 Encounter for immunization: Secondary | ICD-10-CM | POA: Diagnosis not present

## 2020-01-26 DIAGNOSIS — Z23 Encounter for immunization: Secondary | ICD-10-CM | POA: Diagnosis not present

## 2020-03-09 ENCOUNTER — Telehealth: Payer: Self-pay | Admitting: Cardiology

## 2020-03-09 NOTE — Telephone Encounter (Signed)
Patient's sister calling in to see if patient needs to have lab work done before appt on 03/18/20 with Dr. Martinique.

## 2020-03-09 NOTE — Telephone Encounter (Signed)
Reviewing last note- I did not see any mention of any blood work, but will route to MD to advise if patient should come in to have blood work. Thanks!

## 2020-03-10 ENCOUNTER — Telehealth: Payer: Self-pay | Admitting: Cardiology

## 2020-03-10 NOTE — Telephone Encounter (Signed)
She doesn't need labs. Had complete labs in October.  Erin Vandermeulen Martinique MD, [facility]

## 2020-03-10 NOTE — Telephone Encounter (Signed)
New message   Patient's sister wants to know if the patient can have lab work before the appt with Dr. Martinique. Please advise.

## 2020-03-10 NOTE — Telephone Encounter (Signed)
Returned call to patient's sister Mardene Celeste.She stated patient has not been doing good.Stated hard for her to walk.Advised she had labs done 09/2020.Advised wait and ask Dr.Jordan at visit if labs are needed.

## 2020-03-15 NOTE — Progress Notes (Signed)
Cardiology Office Note    Date:  03/18/2020   ID:  [name], DOB February 16, 1929, MRN [id number]  PCP:  [name], MD  Cardiologist:   Martinique, MD    History of Present Illness:  [name] is a 86 y.o. female who has a history of coronary disease and is s/p prior angioplasty of the diagonal branch in 1992. She had stenting of the right coronary in 2005. She has a history of hypertension. She has a history of mild-moderate aortic stenosis by Echo in 2015. Repeat in October 2019 was unchanged. She has chronic back pain related to scoliosis.  She is now living at [facility].  On follow up today she is seen with her sister. She has had one episode of chest pain about 4 months ago relieved with sl Ntg. Lasted about 15 minutes. She has chronic back pain.She is limited with her activity. Is under a lot of stress and this affects her BP.  No other chest pain, dyspnea, edema, or palpitations. No dizziness.    Past Medical History:  Diagnosis Date  . Anxiety and depression   . Aortic stenosis    a. 03/2014: Mild aortic stenosis by valve area and mean gradient though appeared more visually consistent with moderate AS.   [name] Kitchen CAD (coronary artery disease)    a. s/p prior PCI of D2 in 1992 b. stent to RCA in 2005  . Diverticula of colon   . GERD (gastroesophageal reflux disease)   . HTN (hypertension)   . Hypercholesteremia   . Lateral myocardial infarction ([place]) 1992  . Osteoarthritis   . Osteopenia   . Scoliosis     Past Surgical History:  Procedure Laterality Date  . ABDOMINAL HYSTERECTOMY    . APPENDECTOMY    . CORONARY ANGIOPLASTY    . TONSILLECTOMY      Current Medications: Outpatient Medications Prior to Visit  Medication Sig Dispense Refill  . aspirin 81 MG tablet Take 2 tablets (162 mg total) by mouth daily. 180 tablet 3  . atorvastatin (LIPITOR) 40 MG tablet Take 1 tablet (40 mg total) by mouth daily. 90 tablet 3  . cholecalciferol (VITAMIN D) 1000 UNITS  tablet Take 1,000 Units by mouth 2 (two) times daily.    [name] Kitchen FIBER FORMULA PO Take 1 tablet by mouth daily.    . hydrochlorothiazide (HYDRODIURIL) 25 MG tablet Take 1 tablet (25 mg total) by mouth daily. 90 tablet 3  . levocetirizine (XYZAL) 5 MG tablet Take 5 mg by mouth as needed.     [name] Kitchen losartan (COZAAR) 50 MG tablet Take 1 tablet (50 mg total) by mouth daily. 90 tablet 3  . Melatonin 1 MG CAPS Take 1 capsule by mouth daily.    . metoprolol succinate (TOPROL-XL) 50 MG 24 hr tablet TAKE 1 TABLET BY MOUTH EVERY DAY WITH OR RIGHT AFTER MEAL 90 tablet 3  . nitroGLYCERIN (NITROSTAT) 0.4 MG SL tablet Place 1 tablet (0.4 mg total) under the tongue every 5 (five) minutes as needed for chest pain. 25 tablet 2  . amLODipine (NORVASC) 5 MG tablet Take 1 tablet (5 mg total) by mouth daily. 90 tablet 3   No facility-administered medications prior to visit.     Allergies:   Nifedipine, Tetanus toxoids, and Adhesive [tape]   Social History   Socioeconomic History  . Marital status: Divorced    Spouse name: Not on file  . Number of children: 1  . Years of education: Not on file  .  Highest education level: Not on file  Occupational History  . Occupation: retired  Tobacco Use  . Smoking status: Former Smoker    Types: Cigarettes    Quit date: 12/26/1991    Years since quitting: 28.2  . Smokeless tobacco: Never Used  Substance and Sexual Activity  . Alcohol use: No    Alcohol/week: 0.0 standard drinks  . Drug use: Yes    Types: Methylphenidate  . Sexual activity: Not on file  Other Topics Concern  . Not on file  Social History Narrative  . Not on file   Social Determinants of Health   Financial Resource Strain:   . Difficulty of Paying Living Expenses:   Food Insecurity:   . Worried About Charity fundraiser in the Last Year:   . Arboriculturist in the Last Year:   Transportation Needs:   . Film/video editor (Medical):   [name] Kitchen Lack of Transportation (Non-Medical):   Physical Activity:    . Days of Exercise per Week:   . Minutes of Exercise per Session:   Stress:   . Feeling of Stress :   Social Connections:   . Frequency of Communication with Friends and Family:   . Frequency of Social Gatherings with Friends and Family:   . Attends Religious Services:   . Active Member of Clubs or Organizations:   . Attends Archivist Meetings:   [name] Kitchen Marital Status:      Family History:  The patient's family history includes Cancer in her brother; Heart failure in her mother; Hypertension in her father and mother.   ROS:   Please see the history of present illness.    ROS All other systems reviewed and are negative.   PHYSICAL EXAM:   VS:  BP (!) 174/73   Pulse (!) 54   Temp (!) 96.8 F (36 C)   Ht 5' (1.524 m)   Wt 121 lb 9.6 oz (55.2 kg)   SpO2 98%   BMI 23.75 kg/m    GENERAL:  Well appearing, elderly WF in NAD HEENT:  PERRL, EOMI, sclera are clear. Oropharynx is clear. NECK:  No jugular venous distention, carotid upstroke brisk and symmetric, no bruits, no thyromegaly or adenopathy LUNGS:  Clear to auscultation bilaterally CHEST:  Unremarkable HEART:  RRR,  PMI not displaced or sustained,S1 and S2 within normal limits, no S3, no S4: no clicks, no rubs, gr 2/6 harsh systolic murmur RUSB radiates to carotids. EXT:  2 + pulses throughout, no edema, no cyanosis no clubbing SKIN:  Warm and dry.  No rashes NEURO:  Alert and oriented x 3. Cranial nerves II through XII intact. PSYCH:  Cognitively intact   Wt Readings from Last 3 Encounters:  03/18/20 121 lb 9.6 oz (55.2 kg)  08/20/19 119 lb (54 kg)  09/25/18 123 lb 9.6 oz (56.1 kg)      Studies/Labs Reviewed:    Recent Labs: 09/25/2019: ALT 14; BUN 11; Creatinine, Ser 0.64; Hemoglobin 13.8; Platelets 222; Potassium 4.2; Sodium 141; TSH 2.150   Lipid Panel    Component Value Date/Time   CHOL 145 09/25/2019 1027   TRIG 60 09/25/2019 1027   HDL 72 09/25/2019 1027   CHOLHDL 2.0 09/25/2019 1027   CHOLHDL  2 08/22/2013 0946   VLDL 24.0 08/22/2013 0946   LDLCALC 61 09/25/2019 1027    Additional studies/ records that were reviewed today include:  Labs from 07/28/16: cholesterol 156, triglycerides 42, LDL 83, HDL 65. CMET, TSH, CBC  all normal.  Dated 08/06/17: cholesterol 145, triglycerides 67, HDL 59, LDL 73, CMET, CBC normal. TSH normal.  Dated 08/12/18: cholesterol 150, triglycerides 72, HDL 65, LDL 71. Chemistries, Hgb, and TSH normal.  Ecg today shows NSR with RBBB. No change. I have personally reviewed and interpreted this study.  Echo 09/30/18: Study Conclusions  - Left ventricle: The cavity size was normal. Wall thickness was   increased in a pattern of moderate LVH. There was focal basal   hypertrophy. Systolic function was normal. The estimated ejection   fraction was in the range of 60% to 65%. Wall motion was normal;   there were no regional wall motion abnormalities. - Aortic valve: Moderately calcified annulus. Moderately thickened,   moderately calcified leaflets. There was mild to moderate   stenosis. Valve area (VTI): 1.39 cm^2. Valve area (Vmax): 1.29   cm^2. Valve area (Vmean): 1.12 cm^2. - Pulmonary arteries: PA peak pressure: 31 mm Hg (S).   ASSESSMENT:    1. Coronary artery disease of native artery of native heart with stable angina pectoris ([place])   2. Nonrheumatic aortic valve stenosis   3. Hypercholesteremia   4. Essential hypertension      PLAN:  In order of problems listed above:  1. CAD. She has stable and infrequent angina. Continue ASA, beta blocker, amlodipine and statin.  2. Mild to moderate aortic stenosis. No change on last Echo 3. HTN. BP fluctuates a lot depending on stress.  On losartan,  HCTZ, Toprol and amlodipine. Reports she has BP checked once a week at [facility] and it has been close to normal. 4. HLD - LDL excelent 61.   Follow up in 6 months.   Signed,  Martinique, MD  03/18/2020 3:31 PM    [facility]  [address] [place], [place], [address] [phone]

## 2020-03-16 NOTE — Telephone Encounter (Signed)
Already spoke to patient's sister she is aware labs are not needed.

## 2020-03-18 ENCOUNTER — Ambulatory Visit (INDEPENDENT_AMBULATORY_CARE_PROVIDER_SITE_OTHER): Payer: Medicare Other | Admitting: Cardiology

## 2020-03-18 ENCOUNTER — Encounter: Payer: Self-pay | Admitting: Cardiology

## 2020-03-18 ENCOUNTER — Other Ambulatory Visit: Payer: Self-pay

## 2020-03-18 VITALS — BP 174/73 | HR 54 | Temp 96.8°F | Ht 60.0 in | Wt 121.6 lb

## 2020-03-18 DIAGNOSIS — E7800 Pure hypercholesterolemia, unspecified: Secondary | ICD-10-CM

## 2020-03-18 DIAGNOSIS — I350 Nonrheumatic aortic (valve) stenosis: Secondary | ICD-10-CM | POA: Diagnosis not present

## 2020-03-18 DIAGNOSIS — I10 Essential (primary) hypertension: Secondary | ICD-10-CM | POA: Diagnosis not present

## 2020-03-18 DIAGNOSIS — I25118 Atherosclerotic heart disease of native coronary artery with other forms of angina pectoris: Secondary | ICD-10-CM

## 2020-06-29 ENCOUNTER — Telehealth: Payer: Self-pay | Admitting: Cardiology

## 2020-06-29 NOTE — Telephone Encounter (Signed)
She doesn't need before. We can decide when we see her if lab needed and can then draw  Braleigh Massoud Martinique MD, [facility]

## 2020-06-29 NOTE — Telephone Encounter (Signed)
Called patient, gave response from MD.  Patient sister was thankful for call back.

## 2020-06-29 NOTE — Telephone Encounter (Signed)
Please advise, I did not see in your last note that you wanted recent blood work but wanted to check.   Thank you!

## 2020-06-29 NOTE — Telephone Encounter (Signed)
Patient's daughter calling stating the patient hasn't had lab work and would like to know if she will need it before her appointment 09/17/2020.

## 2020-07-05 ENCOUNTER — Other Ambulatory Visit: Payer: Self-pay | Admitting: Cardiology

## 2020-09-14 ENCOUNTER — Telehealth: Payer: Self-pay | Admitting: Cardiology

## 2020-09-14 DIAGNOSIS — Z23 Encounter for immunization: Secondary | ICD-10-CM | POA: Diagnosis not present

## 2020-09-14 NOTE — Telephone Encounter (Signed)
Fraser Din, Sister of the patient called. Fraser Din would like to make Malachy Mood and Dr. Martinique aware of some things going on with the patient before her upcoming appointment. Please have Malachy Mood call the Sister

## 2020-09-14 NOTE — Telephone Encounter (Signed)
Spoke to patient's sister Mardene Celeste.She stated she wanted to let Dr.Jordan know patient has been more forgetful,increased stress,not sleeping well.Stated she cancelled her physical and has not had any lab work.Stated she will be coming with her to appointment with Dr.Jordan 9/24 at 2:20 pm.Advised I will make Dr.Jordan aware.

## 2020-09-14 NOTE — Progress Notes (Signed)
Cardiology Office Note    Date:  09/17/2020   ID:  [name], DOB [birth year], MRN [id number]  PCP:  [name], MD  Cardiologist:  Tamberly Pomplun Martinique, MD    History of Present Illness:  [name] is a 86 y.o. female who has a history of coronary disease and is s/p prior angioplasty of the diagonal branch in 1992. She had stenting of the right coronary in 2005. She has a history of hypertension. She has a history of mild-moderate aortic stenosis by Echo in 2015. Repeat in October 2019 was unchanged. She has chronic back pain related to scoliosis.  She is now living at [facility].  On follow up today she is seen with her sister. She denies any chest pain or SOB. She is not sleeping well. Feels under a lot of stress. Doesn't really like her current living arrangement.  She has chronic back pain.She is limited with her activity. She does walk at least twice a day to the dining room.    Past Medical History:  Diagnosis Date  . Anxiety and depression   . Aortic stenosis    a. 03/2014: Mild aortic stenosis by valve area and mean gradient though appeared more visually consistent with moderate AS.   [name] Kitchen CAD (coronary artery disease)    a. s/p prior PCI of D2 in 1992 b. stent to RCA in 2005  . Diverticula of colon   . GERD (gastroesophageal reflux disease)   . HTN (hypertension)   . Hypercholesteremia   . Lateral myocardial infarction ([place]) 1992  . Osteoarthritis   . Osteopenia   . Scoliosis     Past Surgical History:  Procedure Laterality Date  . ABDOMINAL HYSTERECTOMY    . APPENDECTOMY    . CORONARY ANGIOPLASTY    . TONSILLECTOMY      Current Medications: Outpatient Medications Prior to Visit  Medication Sig Dispense Refill  . amLODipine (NORVASC) 5 MG tablet TAKE 1 TABLET DAILY 90 tablet 3  . aspirin 81 MG tablet Take 2 tablets (162 mg total) by mouth daily. 180 tablet 3  . atorvastatin (LIPITOR) 40 MG tablet TAKE 1 TABLET DAILY 90 tablet 3  . cholecalciferol (VITAMIN  D) 1000 UNITS tablet Take 1,000 Units by mouth 2 (two) times daily.    [name] Kitchen FIBER FORMULA PO Take 1 tablet by mouth daily.    . hydrochlorothiazide (HYDRODIURIL) 25 MG tablet Take 1 tablet (25 mg total) by mouth daily. 90 tablet 3  . levocetirizine (XYZAL) 5 MG tablet Take 5 mg by mouth as needed.     . Melatonin 1 MG CAPS Take 1 capsule by mouth daily.    . metoprolol succinate (TOPROL-XL) 50 MG 24 hr tablet TAKE 1 TABLET BY MOUTH EVERY DAY WITH OR RIGHT AFTER MEAL 90 tablet 3  . nitroGLYCERIN (NITROSTAT) 0.4 MG SL tablet Place 1 tablet (0.4 mg total) under the tongue every 5 (five) minutes as needed for chest pain. 25 tablet 2  . losartan (COZAAR) 50 MG tablet Take 1 tablet (50 mg total) by mouth daily. 90 tablet 3   No facility-administered medications prior to visit.     Allergies:   Nifedipine, Tetanus toxoids, and Adhesive [tape]   Social History   Socioeconomic History  . Marital status: Divorced    Spouse name: Not on file  . Number of children: 1  . Years of education: Not on file  . Highest education level: Not on file  Occupational History  . Occupation:  retired  Tobacco Use  . Smoking status: Former Smoker    Types: Cigarettes    Quit date: 12/26/1991    Years since quitting: 28.7  . Smokeless tobacco: Never Used  Vaping Use  . Vaping Use: Never used  Substance and Sexual Activity  . Alcohol use: No    Alcohol/week: 0.0 standard drinks  . Drug use: Yes    Types: Methylphenidate  . Sexual activity: Not on file  Other Topics Concern  . Not on file  Social History Narrative  . Not on file   Social Determinants of Health   Financial Resource Strain:   . Difficulty of Paying Living Expenses: Not on file  Food Insecurity:   . Worried About Charity fundraiser in the Last Year: Not on file  . Ran Out of Food in the Last Year: Not on file  Transportation Needs:   . Lack of Transportation (Medical): Not on file  . Lack of Transportation (Non-Medical): Not on file   Physical Activity:   . Days of Exercise per Week: Not on file  . Minutes of Exercise per Session: Not on file  Stress:   . Feeling of Stress : Not on file  Social Connections:   . Frequency of Communication with Friends and Family: Not on file  . Frequency of Social Gatherings with Friends and Family: Not on file  . Attends Religious Services: Not on file  . Active Member of Clubs or Organizations: Not on file  . Attends Archivist Meetings: Not on file  . Marital Status: Not on file     Family History:  The patient's family history includes Cancer in her brother; Heart failure in her mother; Hypertension in her father and mother.   ROS:   Please see the history of present illness.    ROS All other systems reviewed and are negative.   PHYSICAL EXAM:   VS:  BP (!) 158/72   Pulse (!) 58   Ht 5' (1.524 m)   Wt 123 lb (55.8 kg)   SpO2 95%   BMI 24.02 kg/m    GENERAL:  Well appearing, elderly WF in NAD HEENT:  PERRL, EOMI, sclera are clear. Oropharynx is clear. NECK:  No jugular venous distention, carotid upstroke brisk and symmetric, no bruits, no thyromegaly or adenopathy LUNGS:  Clear to auscultation bilaterally CHEST:  Unremarkable HEART:  RRR,  PMI not displaced or sustained,S1 and S2 within normal limits, no S3, no S4: no clicks, no rubs, gr 3/6 harsh systolic murmur RUSB radiates to carotids. EXT:  2 + pulses throughout, no edema, no cyanosis no clubbing SKIN:  Warm and dry.  No rashes NEURO:  Alert and oriented x 3. Cranial nerves II through XII intact. PSYCH:  Cognitively intact   Wt Readings from Last 3 Encounters:  09/17/20 123 lb (55.8 kg)  03/18/20 121 lb 9.6 oz (55.2 kg)  08/20/19 119 lb (54 kg)      Studies/Labs Reviewed:    Recent Labs: 09/25/2019: ALT 14; BUN 11; Creatinine, Ser 0.64; Hemoglobin 13.8; Platelets 222; Potassium 4.2; Sodium 141; TSH 2.150   Lipid Panel    Component Value Date/Time   CHOL 145 09/25/2019 1027   TRIG 60  09/25/2019 1027   HDL 72 09/25/2019 1027   CHOLHDL 2.0 09/25/2019 1027   CHOLHDL 2 08/22/2013 0946   VLDL 24.0 08/22/2013 0946   LDLCALC 61 09/25/2019 1027    Additional studies/ records that were reviewed today include:  Labs  from 07/28/16: cholesterol 156, triglycerides 42, LDL 83, HDL 65. CMET, TSH, CBC all normal.  Dated 08/06/17: cholesterol 145, triglycerides 67, HDL 59, LDL 73, CMET, CBC normal. TSH normal.  Dated 08/12/18: cholesterol 150, triglycerides 72, HDL 65, LDL 71. Chemistries, Hgb, and TSH normal.  Ecg today shows NSR with RBBB. Rate 58.  No change. I have personally reviewed and interpreted this study.  Echo 09/30/18: Study Conclusions  - Left ventricle: The cavity size was normal. Wall thickness was   increased in a pattern of moderate LVH. There was focal basal   hypertrophy. Systolic function was normal. The estimated ejection   fraction was in the range of 60% to 65%. Wall motion was normal;   there were no regional wall motion abnormalities. - Aortic valve: Moderately calcified annulus. Moderately thickened,   moderately calcified leaflets. There was mild to moderate   stenosis. Valve area (VTI): 1.39 cm^2. Valve area (Vmax): 1.29   cm^2. Valve area (Vmean): 1.12 cm^2. - Pulmonary arteries: PA peak pressure: 31 mm Hg (S).   ASSESSMENT:    1. Coronary artery disease of native artery of native heart with stable angina pectoris ([place])   2. Nonrheumatic aortic valve stenosis   3. Essential hypertension   4. Hypercholesteremia      PLAN:  In order of problems listed above:  1. CAD. She has stable and infrequent angina. Continue ASA, beta blocker, amlodipine and statin.  2. Mild to moderate aortic stenosis. No change on last Echo in 2019. Consider repeating next year.  3. HTN. BP fluctuates a lot depending on stress. Elevated today. Will increase losartan tp 100 mg daily. Continue  HCTZ, Toprol and amlodipine. Reports she has BP checked once a week at [facility] and it has been close to normal. 4. HLD - update labs today. 5.   Follow up in 6 months.   Signed, Jerrye Seebeck Martinique, MD  09/17/2020 3:30 PM    [facility] [address], [place], [place], [address] [phone]

## 2020-09-14 NOTE — Telephone Encounter (Signed)
Routed to [facility] LPN Appointment with MD 09/17/20

## 2020-09-17 ENCOUNTER — Other Ambulatory Visit: Payer: Self-pay

## 2020-09-17 ENCOUNTER — Ambulatory Visit (INDEPENDENT_AMBULATORY_CARE_PROVIDER_SITE_OTHER): Payer: Medicare Other | Admitting: Cardiology

## 2020-09-17 ENCOUNTER — Encounter: Payer: Self-pay | Admitting: Cardiology

## 2020-09-17 VITALS — BP 158/72 | HR 58 | Ht 60.0 in | Wt 123.0 lb

## 2020-09-17 DIAGNOSIS — E7800 Pure hypercholesterolemia, unspecified: Secondary | ICD-10-CM

## 2020-09-17 DIAGNOSIS — I25118 Atherosclerotic heart disease of native coronary artery with other forms of angina pectoris: Secondary | ICD-10-CM

## 2020-09-17 DIAGNOSIS — I10 Essential (primary) hypertension: Secondary | ICD-10-CM | POA: Diagnosis not present

## 2020-09-17 DIAGNOSIS — I350 Nonrheumatic aortic (valve) stenosis: Secondary | ICD-10-CM

## 2020-09-17 MED ORDER — LOSARTAN POTASSIUM 100 MG PO TABS
100.0000 mg | ORAL_TABLET | Freq: Every day | ORAL | 3 refills | Status: DC
Start: 2020-09-17 — End: 2022-02-13

## 2020-09-17 NOTE — Patient Instructions (Signed)
Increase losartan to 100 mg daily  Continue your other therapy  We will check lab work today  Follow up  In 6 months.

## 2020-09-17 NOTE — Addendum Note (Signed)
Addended by: Kathyrn Lass on: 09/17/2020 03:35 PM   Modules accepted: Orders

## 2020-09-18 LAB — BASIC METABOLIC PANEL
BUN/Creatinine Ratio: 19 (ref 12–28)
BUN: 14 mg/dL (ref 8–27)
CO2: 24 mmol/L (ref 20–29)
Calcium: 9 mg/dL (ref 8.7–10.3)
Chloride: 104 mmol/L (ref 96–106)
Creatinine, Ser: 0.74 mg/dL (ref 0.57–1.00)
GFR calc Af Amer: 84 mL/min/{1.73_m2} (ref >59)
GFR calc non Af Amer: 73 mL/min/{1.73_m2} (ref >59)
Glucose: 85 mg/dL (ref 65–99)
Potassium: 4.2 mmol/L (ref 3.5–5.2)
Sodium: 139 mmol/L (ref 134–144)

## 2020-09-18 LAB — CBC WITH DIFFERENTIAL/PLATELET
Basophils Absolute: 0 10*3/uL (ref 0.0–0.2)
Basos: 1 %
EOS (ABSOLUTE): 0.1 10*3/uL (ref 0.0–0.4)
Eos: 3 %
Hematocrit: 30.5 % — ABNORMAL LOW (ref 34.0–46.6)
Hemoglobin: 9.1 g/dL — ABNORMAL LOW (ref 11.1–15.9)
Immature Grans (Abs): 0 10*3/uL (ref 0.0–0.1)
Immature Granulocytes: 0 %
Lymphocytes Absolute: 0.9 10*3/uL (ref 0.7–3.1)
Lymphs: 18 %
MCH: 21.6 pg — ABNORMAL LOW (ref 26.6–33.0)
MCHC: 29.8 g/dL — ABNORMAL LOW (ref 31.5–35.7)
MCV: 72 fL — ABNORMAL LOW (ref 79–97)
Monocytes Absolute: 0.6 10*3/uL (ref 0.1–0.9)
Monocytes: 13 %
Neutrophils Absolute: 3.4 10*3/uL (ref 1.4–7.0)
Neutrophils: 65 %
Platelets: 262 10*3/uL (ref 150–450)
RBC: 4.22 x10E6/uL (ref 3.77–5.28)
RDW: 19.1 % — ABNORMAL HIGH (ref 11.7–15.4)
WBC: 5.1 10*3/uL (ref 3.4–10.8)

## 2020-09-18 LAB — HEPATIC FUNCTION PANEL
ALT: 16 IU/L (ref 0–32)
AST: 22 IU/L (ref 0–40)
Albumin: 3.9 g/dL (ref 3.6–4.6)
Alkaline Phosphatase: 96 IU/L (ref 44–121)
Bilirubin Total: 0.2 mg/dL (ref 0.0–1.2)
Bilirubin, Direct: 0.11 mg/dL (ref 0.00–0.40)
Total Protein: 6.7 g/dL (ref 6.0–8.5)

## 2020-09-18 LAB — LIPID PANEL
Chol/HDL Ratio: 1.9 ratio (ref 0.0–4.4)
Cholesterol, Total: 138 mg/dL (ref 100–199)
HDL: 73 mg/dL (ref >39)
LDL Chol Calc (NIH): 51 mg/dL (ref 0–99)
Triglycerides: 70 mg/dL (ref 0–149)
VLDL Cholesterol Cal: 14 mg/dL (ref 5–40)

## 2020-09-18 LAB — TSH: TSH: 1.61 u[IU]/mL (ref 0.450–4.500)

## 2020-09-20 ENCOUNTER — Inpatient Hospital Stay (HOSPITAL_COMMUNITY)
Admission: EM | Admit: 2020-09-20 | Discharge: 2020-09-22 | DRG: 812 | Disposition: A | Discharge status: Home / Self Care | Payer: Medicare Other | Attending: Internal Medicine | Admitting: Internal Medicine

## 2020-09-20 ENCOUNTER — Emergency Department (HOSPITAL_COMMUNITY): Payer: Medicare Other

## 2020-09-20 ENCOUNTER — Encounter (HOSPITAL_COMMUNITY): Payer: Self-pay | Admitting: Emergency Medicine

## 2020-09-20 DIAGNOSIS — D649 Anemia, unspecified: Secondary | ICD-10-CM | POA: Diagnosis present

## 2020-09-20 DIAGNOSIS — I480 Paroxysmal atrial fibrillation: Secondary | ICD-10-CM | POA: Diagnosis present

## 2020-09-20 DIAGNOSIS — I2510 Atherosclerotic heart disease of native coronary artery without angina pectoris: Secondary | ICD-10-CM | POA: Diagnosis present

## 2020-09-20 DIAGNOSIS — R8271 Bacteriuria: Secondary | ICD-10-CM | POA: Diagnosis present

## 2020-09-20 DIAGNOSIS — G8929 Other chronic pain: Secondary | ICD-10-CM | POA: Diagnosis present

## 2020-09-20 DIAGNOSIS — D509 Iron deficiency anemia, unspecified: Principal | ICD-10-CM | POA: Diagnosis present

## 2020-09-20 DIAGNOSIS — R54 Age-related physical debility: Secondary | ICD-10-CM | POA: Diagnosis present

## 2020-09-20 DIAGNOSIS — R404 Transient alteration of awareness: Secondary | ICD-10-CM | POA: Diagnosis not present

## 2020-09-20 DIAGNOSIS — I361 Nonrheumatic tricuspid (valve) insufficiency: Secondary | ICD-10-CM | POA: Diagnosis not present

## 2020-09-20 DIAGNOSIS — M419 Scoliosis, unspecified: Secondary | ICD-10-CM | POA: Diagnosis present

## 2020-09-20 DIAGNOSIS — K219 Gastro-esophageal reflux disease without esophagitis: Secondary | ICD-10-CM | POA: Diagnosis present

## 2020-09-20 DIAGNOSIS — I10 Essential (primary) hypertension: Secondary | ICD-10-CM | POA: Diagnosis present

## 2020-09-20 DIAGNOSIS — M549 Dorsalgia, unspecified: Secondary | ICD-10-CM | POA: Diagnosis present

## 2020-09-20 DIAGNOSIS — R0902 Hypoxemia: Secondary | ICD-10-CM | POA: Diagnosis not present

## 2020-09-20 DIAGNOSIS — Z888 Allergy status to other drugs, medicaments and biological substances status: Secondary | ICD-10-CM

## 2020-09-20 DIAGNOSIS — Z20822 Contact with and (suspected) exposure to covid-19: Secondary | ICD-10-CM | POA: Diagnosis present

## 2020-09-20 DIAGNOSIS — M810 Age-related osteoporosis without current pathological fracture: Secondary | ICD-10-CM | POA: Diagnosis present

## 2020-09-20 DIAGNOSIS — R000 Tachycardia, unspecified: Secondary | ICD-10-CM | POA: Diagnosis not present

## 2020-09-20 DIAGNOSIS — L299 Pruritus, unspecified: Secondary | ICD-10-CM | POA: Diagnosis present

## 2020-09-20 DIAGNOSIS — I248 Other forms of acute ischemic heart disease: Secondary | ICD-10-CM | POA: Diagnosis present

## 2020-09-20 DIAGNOSIS — F419 Anxiety disorder, unspecified: Secondary | ICD-10-CM | POA: Diagnosis present

## 2020-09-20 DIAGNOSIS — I252 Old myocardial infarction: Secondary | ICD-10-CM

## 2020-09-20 DIAGNOSIS — I4891 Unspecified atrial fibrillation: Secondary | ICD-10-CM | POA: Diagnosis present

## 2020-09-20 DIAGNOSIS — M545 Low back pain: Secondary | ICD-10-CM | POA: Diagnosis present

## 2020-09-20 DIAGNOSIS — M1990 Unspecified osteoarthritis, unspecified site: Secondary | ICD-10-CM | POA: Diagnosis present

## 2020-09-20 DIAGNOSIS — R0789 Other chest pain: Secondary | ICD-10-CM | POA: Diagnosis not present

## 2020-09-20 DIAGNOSIS — R778 Other specified abnormalities of plasma proteins: Secondary | ICD-10-CM | POA: Diagnosis not present

## 2020-09-20 DIAGNOSIS — I2583 Coronary atherosclerosis due to lipid rich plaque: Secondary | ICD-10-CM | POA: Diagnosis not present

## 2020-09-20 DIAGNOSIS — Z79890 Hormone replacement therapy (postmenopausal): Secondary | ICD-10-CM

## 2020-09-20 DIAGNOSIS — I350 Nonrheumatic aortic (valve) stenosis: Secondary | ICD-10-CM

## 2020-09-20 DIAGNOSIS — R0602 Shortness of breath: Secondary | ICD-10-CM | POA: Diagnosis not present

## 2020-09-20 DIAGNOSIS — F329 Major depressive disorder, single episode, unspecified: Secondary | ICD-10-CM | POA: Diagnosis present

## 2020-09-20 DIAGNOSIS — E785 Hyperlipidemia, unspecified: Secondary | ICD-10-CM | POA: Diagnosis present

## 2020-09-20 DIAGNOSIS — Z7982 Long term (current) use of aspirin: Secondary | ICD-10-CM

## 2020-09-20 DIAGNOSIS — Z887 Allergy status to serum and vaccine status: Secondary | ICD-10-CM

## 2020-09-20 DIAGNOSIS — R079 Chest pain, unspecified: Secondary | ICD-10-CM | POA: Diagnosis not present

## 2020-09-20 DIAGNOSIS — F32A Depression, unspecified: Secondary | ICD-10-CM | POA: Diagnosis present

## 2020-09-20 DIAGNOSIS — E7800 Pure hypercholesterolemia, unspecified: Secondary | ICD-10-CM | POA: Diagnosis present

## 2020-09-20 DIAGNOSIS — Z91048 Other nonmedicinal substance allergy status: Secondary | ICD-10-CM

## 2020-09-20 DIAGNOSIS — Z8249 Family history of ischemic heart disease and other diseases of the circulatory system: Secondary | ICD-10-CM

## 2020-09-20 DIAGNOSIS — Z955 Presence of coronary angioplasty implant and graft: Secondary | ICD-10-CM

## 2020-09-20 DIAGNOSIS — Z87891 Personal history of nicotine dependence: Secondary | ICD-10-CM

## 2020-09-20 DIAGNOSIS — K5730 Diverticulosis of large intestine without perforation or abscess without bleeding: Secondary | ICD-10-CM | POA: Diagnosis present

## 2020-09-20 DIAGNOSIS — I340 Nonrheumatic mitral (valve) insufficiency: Secondary | ICD-10-CM | POA: Diagnosis not present

## 2020-09-20 DIAGNOSIS — Z79899 Other long term (current) drug therapy: Secondary | ICD-10-CM

## 2020-09-20 LAB — IRON AND TIBC
Iron: 13 ug/dL — ABNORMAL LOW (ref 28–170)
Saturation Ratios: 3 % — ABNORMAL LOW (ref 10.4–31.8)
TIBC: 420 ug/dL (ref 250–450)
UIBC: 407 ug/dL

## 2020-09-20 LAB — RESPIRATORY PANEL BY RT PCR (FLU A&B, COVID)
Influenza A by PCR: NEGATIVE
Influenza B by PCR: NEGATIVE
SARS Coronavirus 2 by RT PCR: NEGATIVE

## 2020-09-20 LAB — BASIC METABOLIC PANEL WITH GFR
Anion gap: 10 (ref 5–15)
BUN: 16 mg/dL (ref 8–23)
CO2: 21 mmol/L — ABNORMAL LOW (ref 22–32)
Calcium: 8.1 mg/dL — ABNORMAL LOW (ref 8.9–10.3)
Chloride: 109 mmol/L (ref 98–111)
Creatinine, Ser: 0.78 mg/dL (ref 0.44–1.00)
GFR calc Af Amer: >60 mL/min
GFR calc non Af Amer: >60 mL/min
Glucose, Bld: 118 mg/dL — ABNORMAL HIGH (ref 70–99)
Potassium: 3.7 mmol/L (ref 3.5–5.1)
Sodium: 140 mmol/L (ref 135–145)

## 2020-09-20 LAB — POC OCCULT BLOOD, ED: Fecal Occult Bld: NEGATIVE

## 2020-09-20 LAB — PREPARE RBC (CROSSMATCH)

## 2020-09-20 LAB — CBC
HCT: 27.5 % — ABNORMAL LOW (ref 36.0–46.0)
Hemoglobin: 7.7 g/dL — ABNORMAL LOW (ref 12.0–15.0)
MCH: 21 pg — ABNORMAL LOW (ref 26.0–34.0)
MCHC: 28 g/dL — ABNORMAL LOW (ref 30.0–36.0)
MCV: 74.9 fL — ABNORMAL LOW (ref 80.0–100.0)
Platelets: 213 10*3/uL (ref 150–400)
RBC: 3.67 MIL/uL — ABNORMAL LOW (ref 3.87–5.11)
RDW: 21.1 % — ABNORMAL HIGH (ref 11.5–15.5)
WBC: 4.6 10*3/uL (ref 4.0–10.5)
nRBC: 0 % (ref 0.0–0.2)

## 2020-09-20 LAB — TSH: TSH: 3.738 u[IU]/mL (ref 0.350–4.500)

## 2020-09-20 LAB — TROPONIN I (HIGH SENSITIVITY)
Troponin I (High Sensitivity): 87 ng/L — ABNORMAL HIGH (ref <18)
Troponin I (High Sensitivity): 603 ng/L (ref <18)

## 2020-09-20 LAB — ABO/RH: ABO/RH(D): B POS

## 2020-09-20 LAB — MAGNESIUM: Magnesium: 2 mg/dL (ref 1.7–2.4)

## 2020-09-20 LAB — FERRITIN: Ferritin: 7 ng/mL — ABNORMAL LOW (ref 11–307)

## 2020-09-20 MED ORDER — SODIUM CHLORIDE 0.9% IV SOLUTION: Freq: Once | INTRAVENOUS | Status: AC

## 2020-09-20 MED ORDER — ONDANSETRON HCL 4 MG/2ML IJ SOLN: 4.0000 mg | Freq: Four times a day (QID) | INTRAMUSCULAR | Status: DC | PRN

## 2020-09-20 MED ORDER — POTASSIUM CHLORIDE CRYS ER 20 MEQ PO TBCR
40.0000 meq | EXTENDED_RELEASE_TABLET | Freq: Once | ORAL | Status: AC
  Administered 2020-09-20: 40 meq via ORAL
  Filled 2020-09-20: qty 2

## 2020-09-20 MED ORDER — METOPROLOL SUCCINATE ER 50 MG PO TB24
50.0000 mg | ORAL_TABLET | Freq: Every day | ORAL | Status: DC
  Administered 2020-09-20 – 2020-09-21 (×2): 50 mg via ORAL
  Filled 2020-09-20 (×2): qty 2
  Filled 2020-09-20: qty 1

## 2020-09-20 MED ORDER — MORPHINE SULFATE (PF) 4 MG/ML IV SOLN
4.0000 mg | Freq: Once | INTRAVENOUS | Status: AC
  Administered 2020-09-20: 4 mg via INTRAVENOUS
  Filled 2020-09-20: qty 1

## 2020-09-20 MED ORDER — ACETAMINOPHEN 325 MG PO TABS: 650.0000 mg | ORAL_TABLET | ORAL | Status: DC | PRN

## 2020-09-20 NOTE — Consult Note (Addendum)
Cardiology Consultation:   Patient ID: [name] MRN: [id number]; DOB: 09-11-1929  Admit date: 09/20/2020 Date of Consult: 09/20/2020  Primary Care Provider: [name], MD [facility] Cardiologist: [name], MD  [facility] Electrophysiologist:  None    Patient Profile:   [name] is a 86 y.o. female with a history of CAD s/p prior stenting to D2 in 1992 and RCA in 2005, mild to moderate aortic stenosis, hypertension, hyperlipidemia, GERD, chronic back pain related to scoliosis, anxiety, and depression who is being seen today for the evaluation of chest pain in the setting of new onset atrial fibrillation with RVR at the request of Dr. Darl Householder.  History of Present Illness:   [name] is a 86 year old female with the above history who is followed by Dr. Martinique. Last Echo in 09/2018 showed LVEF of 60-65% with normal wall motion and moderate LVH. She was recently seen by Dr. Martinique on 09/17/2020 at which time she was not sleeping well and was under a lot of stress but was doing well from a cardiac standpoint.   Patient presented to the ED today via EMS from [place] home with chest pain. On EMS arrival, patient was found to be in new onset atrial fibrillation with rates of up to 190 bpm. She was given 2 doses of sublingual Nitro before EMS arrived and then EMS gave 324mg  of Aspirin as well as 800 mL of normal saline bolus. Patient was cardioverted by EMS with restoration of sinus rhythm. Upon arrival to the ED, EKG showed normal sinus rhythm with RBBB and T wave inversions in lead III and biphasic T waves in V3. Unchanged from prior tracings. High-sensitivity troponin mildly elevated at 87. Chest x-ray showed cardiomegaly with streaky left basilar opacity which may represent atelectasis vs infiltrate. WBC 4.6, Hgb 7.7 (down from 9.1 on 09/17/2020), Plts 213. Na 140, K 3.7, Glucose 118, BUN 16, Cr 0.78. TSH normal.   At the time of this evaluation, patient  resting comfortably in no acute distress. She is maintaining sinus rhythm with rates in the 70's to 80's. She reports that last night she could not sleep well; however, this is not new. She has always had a problem with this. Then early this morning around 3-4am, she began to feel unwell and developed shortness of breath and chest discomfort that she states felt like someone hit her in the chest. No pleuritic chest pain. She denies any associated palpitations, lightheadedness, dizziness, syncope, diaphoresis, nausea, or vomiting. Symptoms persisted and worsened for about 12 hours to the point where she just felt "miserable" which is when she noted staff at her assisted living facility and 911 was called. She denies any recent fevers or illnesses. She denies any abnormal bleeding - no hematuria, melena, hematochezia, or hemoptysis. She does note a poor appetite but this is also not new. She also mentioned several times that her "nerves" have been bothering her a lot lately. She does not like where she is living at this time and this is causing her a lot of stress.   Symptoms resolved with restoration of sinus rhythm. She denies any orthopnea, PND, or edema. However, in the ED she did not that it was a little harder to breathe laying supine. I propped her up and she stated this helped. Main complaint at this time is her chronic back pain.  She has a remote smoking history in her teens/early twenties but nothing since then. She does have a  history of CV disease her mother dying from a massive stroke in her 22's and her father having a heart attack in his 17's.  Past Medical History:  Diagnosis Date  . Anxiety and depression   . Aortic stenosis    a. 03/2014: Mild aortic stenosis by valve area and mean gradient though appeared more visually consistent with moderate AS.   [name] Kitchen CAD (coronary artery disease)    a. s/p prior PCI of D2 in 1992 b. stent to RCA in 2005  . Diverticula of colon   . GERD  (gastroesophageal reflux disease)   . HTN (hypertension)   . Hypercholesteremia   . Lateral myocardial infarction ([place]) 1992  . Osteoarthritis   . Osteopenia   . Scoliosis     Past Surgical History:  Procedure Laterality Date  . ABDOMINAL HYSTERECTOMY    . APPENDECTOMY    . CORONARY ANGIOPLASTY    . TONSILLECTOMY       Home Medications:  Prior to Admission medications   Medication Sig Start Date End Date Taking? Authorizing Provider  amLODipine (NORVASC) 5 MG tablet TAKE 1 TABLET DAILY 07/07/20  Yes Martinique, Peter M, MD  aspirin 81 MG tablet Take 2 tablets (162 mg total) by mouth daily. 12/11/17  Yes Martinique, Peter M, MD  atorvastatin (LIPITOR) 40 MG tablet TAKE 1 TABLET DAILY 07/07/20  Yes Martinique, Peter M, MD  cholecalciferol (VITAMIN D) 1000 UNITS tablet Take 1,000 Units by mouth 2 (two) times daily.   Yes [provider]  FIBER FORMULA PO Take 1 tablet by mouth daily.   Yes [provider]  hydrochlorothiazide (HYDRODIURIL) 25 MG tablet Take 1 tablet (25 mg total) by mouth daily. 08/20/19  Yes Martinique, Peter M, MD  losartan (COZAAR) 100 MG tablet Take 1 tablet (100 mg total) by mouth daily. 09/17/20 09/12/21 Yes Martinique, Peter M, MD  Melatonin 1 MG CAPS Take 1 capsule by mouth every evening.    Yes [provider]  metoprolol succinate (TOPROL-XL) 50 MG 24 hr tablet TAKE 1 TABLET BY MOUTH EVERY DAY WITH OR RIGHT AFTER MEAL 05/23/19  Yes Martinique, Peter M, MD  nitroGLYCERIN (NITROSTAT) 0.4 MG SL tablet Place 1 tablet (0.4 mg total) under the tongue every 5 (five) minutes as needed for chest pain. 03/27/17  Yes Strader, Fransisco Hertz, PA-C    Inpatient Medications: Scheduled Meds:  Continuous Infusions:  PRN Meds:   Allergies:    Allergies  Allergen Reactions  . Nifedipine Other (See Comments)    Dark Urine; can only take orange tablet, allergic to yellow-brown tablet  . Tetanus Toxoids   . Adhesive [Tape] Rash    Social History:   Social History    Socioeconomic History  . Marital status: Divorced    Spouse name: Not on file  . Number of children: 1  . Years of education: Not on file  . Highest education level: Not on file  Occupational History  . Occupation: retired  Tobacco Use  . Smoking status: Former Smoker    Types: Cigarettes    Quit date: 12/26/1991    Years since quitting: 28.7  . Smokeless tobacco: Never Used  Vaping Use  . Vaping Use: Never used  Substance and Sexual Activity  . Alcohol use: No    Alcohol/week: 0.0 standard drinks  . Drug use: Yes    Types: Methylphenidate  . Sexual activity: Not on file  Other Topics Concern  . Not on file  Social History Narrative  . Not  on file   Social Determinants of Health   Financial Resource Strain:   . Difficulty of Paying Living Expenses: Not on file  Food Insecurity:   . Worried About Charity fundraiser in the Last Year: Not on file  . Ran Out of Food in the Last Year: Not on file  Transportation Needs:   . Lack of Transportation (Medical): Not on file  . Lack of Transportation (Non-Medical): Not on file  Physical Activity:   . Days of Exercise per Week: Not on file  . Minutes of Exercise per Session: Not on file  Stress:   . Feeling of Stress : Not on file  Social Connections:   . Frequency of Communication with Friends and Family: Not on file  . Frequency of Social Gatherings with Friends and Family: Not on file  . Attends Religious Services: Not on file  . Active Member of Clubs or Organizations: Not on file  . Attends Archivist Meetings: Not on file  . Marital Status: Not on file  Intimate Partner Violence:   . Fear of Current or Ex-Partner: Not on file  . Emotionally Abused: Not on file  . Physically Abused: Not on file  . Sexually Abused: Not on file    Family History:    Family History  Problem Relation Age of Onset  . Hypertension Father   . Heart failure Mother   . Hypertension Mother   . Cancer Brother      ROS:   Please see the history of present illness.  All other ROS reviewed and negative.     Physical Exam/Data:   Vitals:   09/20/20 1428 09/20/20 1600  BP: (!) 129/56 136/67  Pulse: 66 81  Resp: 18 (!) 28  Temp: 97.6 F (36.4 C)   SpO2: 97% 96%  Weight: 54.9 kg   Height: 5' (1.524 m)    No intake or output data in the 24 hours ending 09/20/20 1637 Last 3 Weights 09/20/2020 09/17/2020 03/18/2020  Weight (lbs) 121 lb 123 lb 121 lb 9.6 oz  Weight (kg) 54.885 kg 55.792 kg 55.157 kg     Body mass index is 23.63 kg/m.  General: 86 y.o. thin, frail Caucasian female resting comfortably in no acute distress. HEENT: Normocephalic and atraumatic. Sclera clear.  Neck: Supple. Bilateral carotid bruits (suspect this is radiation from aortic stenosis murmur). No JVD. Heart: RRR. Distinct S1 and S2. III/VI systolic murmur consistent with aortic stenosis. No gallops or rubs. Radial pulses 2+ and equal bilaterally. Lungs: No increased work of breathing. Clear to ausculation bilaterally. No wheezes, rhonchi, or rales.  Abdomen: Soft, non-distended, and non-tender to palpation. Bowel sounds present. Extremities: No lower extremity edema.    Skin: Warm and dry. Neuro: Alert and oriented x3. No focal deficits. Psych: Normal affect. Responds appropriately.  EKG:  The EKG was personally reviewed and demonstrates:  - Strip from EMS: Atrial fibrillation, rate 130's, with diffuse ST depression and mild ST depression in aVR and aVL.  - EKG in the ED: Normal sinus rhythm with RBBB and T wave inversions in lead III and biphasic T waves in V3. Unchanged from prior tracings.   Telemetry:  Telemetry was personally reviewed and demonstrates:  Normal sinus rhythm with PACs. Rates in the 70's to 80's.  Relevant CV Studies:  Echocardiogram 09/30/2018: Study Conclusions: - Left ventricle: The cavity size was normal. Wall thickness was  increased in a pattern of moderate LVH. There was focal basal  hypertrophy.  Systolic function was normal. The estimated ejection  fraction was in the range of 60% to 65%. Wall motion was normal;  there were no regional wall motion abnormalities.  - Aortic valve: Moderately calcified annulus. Moderately thickened,  moderately calcified leaflets. There was mild to moderate  stenosis. Valve area (VTI): 1.39 cm^2. Valve area (Vmax): 1.29  cm^2. Valve area (Vmean): 1.12 cm^2.  - Pulmonary arteries: PA peak pressure: 31 mm Hg (S).  Laboratory Data:  High Sensitivity Troponin:   Recent Labs  Lab 09/20/20 1452  TROPONINIHS 87*     Chemistry Recent Labs  Lab 09/17/20 1559 09/20/20 1452  NA 139 140  K 4.2 3.7  CL 104 109  CO2 24 21*  GLUCOSE 85 118*  BUN 14 16  CREATININE 0.74 0.78  CALCIUM 9.0 8.1*  GFRNONAA 73 >60  GFRAA 84 >60  ANIONGAP  --  10    Recent Labs  Lab 09/17/20 1559  PROT 6.7  ALBUMIN 3.9  AST 22  ALT 16  ALKPHOS 96  BILITOT 0.2   Hematology Recent Labs  Lab 09/17/20 1559 09/20/20 1452  WBC 5.1 4.6  RBC 4.22 3.67*  HGB 9.1* 7.7*  HCT 30.5* 27.5*  MCV 72* 74.9*  MCH 21.6* 21.0*  MCHC 29.8* 28.0*  RDW 19.1* 21.1*  PLT 262 213   BNPNo results for input(s): BNP, PROBNP in the last 168 hours.  DDimer No results for input(s): DDIMER in the last 168 hours.   Radiology/Studies:  DG Chest Port 1 View  Result Date: 09/20/2020 CLINICAL DATA:  Atrial fibrillation, shortness of breath EXAM: PORTABLE CHEST 1 VIEW COMPARISON:  07/25/2005 FINDINGS: Postsurgical changes to the sternum and mediastinum. Cardiomegaly, increased from remote prior. Atherosclerotic calcification of the aortic knob. Streaky left basilar opacity. Right lung is clear. No pleural effusion or pneumothorax. Degenerative changes of the bilateral shoulders and thoracic spine. IMPRESSION: 1. Streaky left basilar opacity which may represent atelectasis versus infiltrate. 2. Cardiomegaly, increased from remote prior. Electronically Signed   By: Davina Poke  D.O.   On: 09/20/2020 15:34    Assessment and Plan:   Atrial Fibrillation with RVR - Upon EMS arrival, patient in atrial fibrillation with rates in 190's. Patient's successful cardioverted with restoration of sinus rhythm in the field. - Maintaining sinus rhythm with rates in the 70's to 80's. - Potassium 3.7. Goal >4.0. Will give dose of K-Dur. - Magnesium 2.0. Goal >2.0. - TSH normal.  - Will update Echo. - Continue home Toprol-XL 50mg  daily. - CHA2DS2-VASc = 5 (CAD, HTN, age x2, female). However, would not anticoagulate at this time given acute anemia until GI has seen. Patient also ambulates with a walk and reports feeling more unsteady on her feet.  Chest Pain Demand Ischemia - In the setting of atrial fibrillation with RVR.  - Chest pain resolved with restoration of sinus rhythm. - EKG shows no acute ischemic changes.  - Initial high-senstivity troponin mildly elevated in the 80's. Suspect demand ischemia in setting of atrial fibrillation with RVR and acute anemia as well as post DCCV but will repeat troponin and check Echo.  CAD - History of prior stenting to D2 in 1992 and RCA in 2005. - Chest pain resolved with restoration of sinus rhythm.  - Continue beta-blocker and statin. Hold aspirin for now given acute anemia.  Aortic Stenosis - Mild to moderate AS on last Echo in 09/2018.  - Bruit on exam with radiation to both carotids.  - Will repeat Echo.  Acute  Anemia - Hemoglobin 7.7 on arrival, down from 9.1 on 09/17/2020 and 13.8 from 09/2019.  - Patient denies any abnormal bleeding.  - Will order hemoccult.  - Will order iron panel and ferritin. - Recommend GI work-up. Will defer to primary team.  Hypertension - BP mildly elevated. Patient is unsure if she took her medications this morning but does not think she did.  - Would home Toprol-XL 50mg  daily.  - Will defer other medications to primary team. BP stable at this time but if there is concern for active GI bleed  may want to hold some of home medications.  Hyperlipidemia - Continue Lipitor 40mg  daily.    For questions or updates, please contact [place] Please consult [email or web address] for contact info under    Signed, Darreld Mclean, PA-C  09/20/2020 4:37 PM

## 2020-09-20 NOTE — H&P (Signed)
History and Physical   [name] SFK:[id number] DOB: [birth year] DOA: 09/20/2020  Referring MD/NP/PA: Dr. Jerrol Banana  PCP: [name], MD   Outpatient Specialists: None Dr. [name]  Patient coming from: Home  Chief Complaint: Chest pain with new A. fib with RVR  HPI: [name] is a 86 y.o. female with medical history significant of coronary artery disease status post stenting in 1992.  Also another stent 2005 to RCA, moderate aortic stenosis, hypertension, hyperlipidemia, chronic pain and GERD, scoliosis, depression with anxiety, osteoporosis, GERD who came in today with chest pain and new A. fib with RVR.  EMS apparently responded to her call.  Noted that patient was in A. fib with RVR and cardioverted her.  She has since been having anterior chest wall pain.  She was seen in the ER and cardiology consulted.  Patient was evaluated and at this point based on her recent visit with Dr. Martinique 3 days ago patient does not appear to be having you cardiac event.  She had an echo 3 years ago with EF of 60 to 65%.  No abnormal changes to the EKG.  Patient notes anticoagulated.  She is currently back in sinus rhythm and staying in sinus rhythm.  We will be admitting patient due to a resolved anemia.  Most likely symptomatic anemia leading to her tachycardia and then A. fib with RVR also..  ED Course: Temperature 97.6 blood pressure 153/76 pulse 84 respiratory 28 oxygen sat 93% room air.  Work-up for passive hemoglobin 7.7 and platelets 213 sodium 140 CO2 21 calcium 8.1 glucose 118.  COVID-19 screen so far negative.  Fecal occult blood testing is negative.  Patient being admitted with possible symptomatic anemia leading to some cardiac arrhythmias.  Review of Systems: As per HPI otherwise 10 point review of systems negative.    Past Medical History:  Diagnosis Date  . Anxiety and depression   . Aortic stenosis    a. 03/2014: Mild aortic stenosis by valve area and mean gradient though appeared  more visually consistent with moderate AS.   [name] Kitchen CAD (coronary artery disease)    a. s/p prior PCI of D2 in 1992 b. stent to RCA in 2005  . Diverticula of colon   . GERD (gastroesophageal reflux disease)   . HTN (hypertension)   . Hypercholesteremia   . Lateral myocardial infarction ([place]) 1992  . Osteoarthritis   . Osteopenia   . Scoliosis     Past Surgical History:  Procedure Laterality Date  . ABDOMINAL HYSTERECTOMY    . APPENDECTOMY    . CORONARY ANGIOPLASTY    . TONSILLECTOMY       reports that she quit smoking about 28 years ago. Her smoking use included cigarettes. She has never used smokeless tobacco. She reports current drug use. Drug: Methylphenidate. She reports that she does not drink alcohol.  Allergies  Allergen Reactions  . Nifedipine Other (See Comments)    Dark Urine; can only take orange tablet, allergic to yellow-brown tablet  . Tetanus Toxoids   . Adhesive [Tape] Rash    Family History  Problem Relation Age of Onset  . Hypertension Father   . Heart failure Mother   . Hypertension Mother   . Cancer Brother      Prior to Admission medications   Medication Sig Start Date End Date Taking? Authorizing Provider  amLODipine (NORVASC) 5 MG tablet TAKE 1 TABLET DAILY 07/07/20  Yes Martinique, Peter M, MD  aspirin 81 MG tablet Take  2 tablets (162 mg total) by mouth daily. 12/11/17  Yes Martinique, Peter M, MD  atorvastatin (LIPITOR) 40 MG tablet TAKE 1 TABLET DAILY 07/07/20  Yes Martinique, Peter M, MD  cholecalciferol (VITAMIN D) 1000 UNITS tablet Take 1,000 Units by mouth 2 (two) times daily.   Yes [provider]  FIBER FORMULA PO Take 1 tablet by mouth daily.   Yes [provider]  hydrochlorothiazide (HYDRODIURIL) 25 MG tablet Take 1 tablet (25 mg total) by mouth daily. 08/20/19  Yes Martinique, Peter M, MD  losartan (COZAAR) 100 MG tablet Take 1 tablet (100 mg total) by mouth daily. 09/17/20 09/12/21 Yes Martinique, Peter M, MD  Melatonin 1 MG CAPS Take 1  capsule by mouth every evening.    Yes [provider]  metoprolol succinate (TOPROL-XL) 50 MG 24 hr tablet TAKE 1 TABLET BY MOUTH EVERY DAY WITH OR RIGHT AFTER MEAL 05/23/19  Yes Martinique, Peter M, MD  nitroGLYCERIN (NITROSTAT) 0.4 MG SL tablet Place 1 tablet (0.4 mg total) under the tongue every 5 (five) minutes as needed for chest pain. 03/27/17  Yes Erma Heritage, [place]    Physical Exam: Vitals:   09/20/20 1830 09/20/20 1832 09/20/20 1900 09/20/20 2100  BP: (!) 156/76 (!) 156/76 (!) 145/82 133/72  Pulse: 67 70 63 66  Resp: 20  15 17   Temp:      SpO2: 99%  93% 96%  Weight:      Height:          Constitutional: Frail, weak, no distress Vitals:   09/20/20 1830 09/20/20 1832 09/20/20 1900 09/20/20 2100  BP: (!) 156/76 (!) 156/76 (!) 145/82 133/72  Pulse: 67 70 63 66  Resp: 20  15 17   Temp:      SpO2: 99%  93% 96%  Weight:      Height:       Eyes: PERRL, lids and conjunctivae pale ENMT: Mucous membranes are dry. Posterior pharynx clear of any exudate or lesions.Normal dentition.  Neck: normal, supple, no masses, no thyromegaly Respiratory: clear to auscultation bilaterally, no wheezing, no crackles. Normal respiratory effort. No accessory muscle use.  Cardiovascular: Regular rate and rhythm, no murmurs / rubs / gallops. No extremity edema. 2+ pedal pulses. No carotid bruits.  Abdomen: no tenderness, no masses palpated. No hepatosplenomegaly. Bowel sounds positive.  Musculoskeletal: no clubbing / cyanosis. No joint deformity upper and lower extremities. Good ROM, no contractures. Normal muscle tone.  Skin: no rashes, lesions, ulcers. No induration Neurologic: CN 2-12 grossly intact. Sensation intact, DTR normal. Strength 5/5 in all 4.  Psychiatric: Normal judgment and insight. Alert and oriented x 3. Normal mood.     Labs on Admission: I have personally reviewed following labs and imaging studies  CBC: Recent Labs  Lab 09/17/20 1559 09/20/20 1452  WBC 5.1 4.6    NEUTROABS 3.4  --   HGB 9.1* 7.7*  HCT 30.5* 27.5*  MCV 72* 74.9*  PLT 262 209   Basic Metabolic Panel: Recent Labs  Lab 09/17/20 1559 09/20/20 1452  NA 139 140  K 4.2 3.7  CL 104 109  CO2 24 21*  GLUCOSE 85 118*  BUN 14 16  CREATININE 0.74 0.78  CALCIUM 9.0 8.1*  MG  --  2.0   GFR: Estimated Creatinine Clearance: 37.8 mL/min (by C-G formula based on SCr of 0.78 mg/dL). Liver Function Tests: Recent Labs  Lab 09/17/20 1559  AST 22  ALT 16  ALKPHOS 96  BILITOT 0.2  PROT  6.7  ALBUMIN 3.9   No results for input(s): LIPASE, AMYLASE in the last 168 hours. No results for input(s): AMMONIA in the last 168 hours. Coagulation Profile: No results for input(s): INR, PROTIME in the last 168 hours. Cardiac Enzymes: No results for input(s): CKTOTAL, CKMB, CKMBINDEX, TROPONINI in the last 168 hours. BNP (last 3 results) No results for input(s): PROBNP in the last 8760 hours. HbA1C: No results for input(s): HGBA1C in the last 72 hours. CBG: No results for input(s): GLUCAP in the last 168 hours. Lipid Profile: No results for input(s): CHOL, HDL, LDLCALC, TRIG, CHOLHDL, LDLDIRECT in the last 72 hours. Thyroid Function Tests: Recent Labs    09/20/20 1453  TSH 3.738   Anemia Panel: Recent Labs    09/20/20 1806  FERRITIN 7*  TIBC 420  IRON 13*   Urine analysis: No results found for: COLORURINE, APPEARANCEUR, LABSPEC, PHURINE, GLUCOSEU, HGBUR, BILIRUBINUR, KETONESUR, PROTEINUR, UROBILINOGEN, NITRITE, LEUKOCYTESUR Sepsis Labs: @LABRCNTIP (procalcitonin:4,lacticidven:4) ) Recent Results (from the past 240 hour(s))  Respiratory Panel by RT PCR (Flu A&B, Covid) - Nasopharyngeal Swab     Status: None   Collection Time: 09/20/20  7:55 PM   Specimen: Nasopharyngeal Swab  Result Value Ref Range Status   SARS Coronavirus 2 by RT PCR NEGATIVE NEGATIVE Final    Comment: (NOTE) SARS-CoV-2 target nucleic acids are NOT DETECTED.  The SARS-CoV-2 RNA is generally detectable in  upper respiratoy specimens during the acute phase of infection. The lowest concentration of SARS-CoV-2 viral copies this assay can detect is 131 copies/mL. A negative result does not preclude SARS-Cov-2 infection and should not be used as the sole basis for treatment or other patient management decisions. A negative result may occur with  improper specimen collection/handling, submission of specimen other than nasopharyngeal swab, presence of viral mutation(s) within the areas targeted by this assay, and inadequate number of viral copies (<131 copies/mL). A negative result must be combined with clinical observations, patient history, and epidemiological information. The expected result is Negative.  Fact Sheet for Patients:  PinkCheek.be  Fact Sheet for Healthcare Providers:  GravelBags.it  This test is no t yet approved or cleared by the [place] FDA and  has been authorized for detection and/or diagnosis of SARS-CoV-2 by FDA under an Emergency Use Authorization (EUA). This EUA will remain  in effect (meaning this test can be used) for the duration of the COVID-19 declaration under Section 564(b)(1) of the Act, 21 U.S.C. section 360bbb-3(b)(1), unless the authorization is terminated or revoked sooner.     Influenza A by PCR NEGATIVE NEGATIVE Final   Influenza B by PCR NEGATIVE NEGATIVE Final    Comment: (NOTE) The Xpert Xpress SARS-CoV-2/FLU/RSV assay is intended as an aid in  the diagnosis of influenza from Nasopharyngeal swab specimens and  should not be used as a sole basis for treatment. Nasal washings and  aspirates are unacceptable for Xpert Xpress SARS-CoV-2/FLU/RSV  testing.  Fact Sheet for Patients: PinkCheek.be  Fact Sheet for Healthcare Providers: GravelBags.it  This test is not yet approved or cleared by the [place] FDA and  has been  authorized for detection and/or diagnosis of SARS-CoV-2 by  FDA under an Emergency Use Authorization (EUA). This EUA will remain  in effect (meaning this test can be used) for the duration of the  Covid-19 declaration under Section 564(b)(1) of the Act, 21  U.S.C. section 360bbb-3(b)(1), unless the authorization is  terminated or revoked. Performed at [facility], [place] [address] [place], [place]  [place] on Admission: DG Chest Port 1 View  Result Date: 09/20/2020 CLINICAL DATA:  Atrial fibrillation, shortness of breath EXAM: PORTABLE CHEST 1 VIEW COMPARISON:  07/25/2005 FINDINGS: Postsurgical changes to the sternum and mediastinum. Cardiomegaly, increased from remote prior. Atherosclerotic calcification of the aortic knob. Streaky left basilar opacity. Right lung is clear. No pleural effusion or pneumothorax. Degenerative changes of the bilateral shoulders and thoracic spine. IMPRESSION: 1. Streaky left basilar opacity which may represent atelectasis versus infiltrate. 2. Cardiomegaly, increased from remote prior. Electronically Signed   By: Davina Poke D.O.   On: 09/20/2020 15:34    EKG: Independently reviewed.  Current EKG shows normal sinus rhythm with a rate of 82.  T wave inversions in the lateral leads.  No other abnormalities.  Unchanged from previous.  Assessment/Plan Principal Problem:   Symptomatic anemia Active Problems:   GERD   CAD (coronary artery disease)   HTN (hypertension)   Hypercholesteremia   Anxiety and depression   Aortic stenosis, mild   Atrial fibrillation with RVR (HCC)     #1  A. fib with RVR: Patient has been converted to sinus rhythm via cardioversion.  She is staying there.  Reluctant to use anticoagulation in the setting of her anemia that required blood transfusion.  Cardiology to follow.  Echocardiogram to be repeated since her last when he was 2 years ago.  No chest pain at this point as patient appears  stable.  #2 symptomatic anemia: Patient appears to be evidence of significant anemia probably leading to her current symptoms.  We will transfuse 2 units of packed red blood cell.  Monitor H&H closely.  #3 coronary artery disease: Stable.  Appears to be returning to baseline.  #4 hyperlipidemia: Continue statins  #5 essential hypertension: Resume home regimen and monitor.  #6 aortic stenosis: Stable.  Continue per cardiology  #7 anxiety with depression: Continue home regimen.  #8 GERD: Continue with PPIs   DVT prophylaxis: SCD Code Status: Full code Family Communication: No family at bedside Disposition Plan: Home Consults called: Cardiology [facility]. Admission status: Inpatient  Severity of Illness: The appropriate patient status for this patient is INPATIENT. Inpatient status is judged to be reasonable and necessary in order to provide the required intensity of service to ensure the patient's safety. The patient's presenting symptoms, physical exam findings, and initial radiographic and laboratory data in the context of their chronic comorbidities is felt to place them at high risk for further clinical deterioration. Furthermore, it is not anticipated that the patient will be medically stable for discharge from the hospital within 2 midnights of admission. The following factors support the patient status of inpatient.   " The patient's presenting symptoms include new onset A. fib. " The worrisome physical exam findings include normal sinus rhythm. " The initial radiographic and laboratory data are worrisome because of hemoglobin of seven-point. " The chronic co-morbidities include A. fib.   * I certify that at the point of admission it is my clinical judgment that the patient will require inpatient hospital care spanning beyond 2 midnights from the point of admission due to high intensity of service, high risk for further deterioration and high frequency of surveillance  required.Barbette Merino MD [facility] Pager [phone]  If 7PM-7AM, please contact night-coverage [email or web address] Password [facility]  09/20/2020, 10:55 PM

## 2020-09-20 NOTE — ED Triage Notes (Signed)
Pt to ED via EMS from independent living [facility] [place] c/o of chest pain. On EMS arrival pt in new onset AFIB HR up to 190bpm.- Per staff pt was altered, SHOB, Pt hx MI. #[id number]- Pt took 2 nitro prior to EMS arrival. EMS gave 324 mg aspirin. 873ml NS Bolus given by EMS. Pt cardioverted at 200J successfully by EMS. Pt denies chest pain after cardioversion. Last VS: 120/70, 100%3L, HR 50-60, regular. CBG 166.

## 2020-09-20 NOTE — ED Provider Notes (Signed)
[place] Provider Note   CSN: [id number] Arrival date & time: 09/20/20  1415     History Chief Complaint  Patient presents with  . Chest Pain  . Atrial Fibrillation    [name] is a 86 y.o. female history of CAD status post stent, aortic stenosis, who presenting with chest pain and shortness of breath.  Patient states that she is from [facility] independent living.  She states that she woke around 3 AM and had some funny feeling in her chest.  She states that she just does not feel well.  She did some laundry and did not have any appetite. She has some persistent chest pressure so called EMS.  She was noted to be in rapid A. Fib  rate of 190s and was shocked.  She did not remember receiving a shock.  She states that she never had A. fib before.  She does have CAD with stent.  She does follow-up with Dr. Martinique from cardiology and was seen in the office 3 days ago.   The history is provided by the patient.       Past Medical History:  Diagnosis Date  . Anxiety and depression   . Aortic stenosis    a. 03/2014: Mild aortic stenosis by valve area and mean gradient though appeared more visually consistent with moderate AS.   [name] Kitchen CAD (coronary artery disease)    a. s/p prior PCI of D2 in 1992 b. stent to RCA in 2005  . Diverticula of colon   . GERD (gastroesophageal reflux disease)   . HTN (hypertension)   . Hypercholesteremia   . Lateral myocardial infarction ([place]) 1992  . Osteoarthritis   . Osteopenia   . Scoliosis     Patient Active Problem List   Diagnosis Date Noted  . Dizzy 06/05/2013  . Aortic stenosis, mild   . CAD (coronary artery disease)   . HTN (hypertension)   . Hypercholesteremia   . Anxiety and depression   . Lateral myocardial infarction ([place])   . Scoliosis   . GERD 06/09/2009  . PERSONAL HISTORY OF COLONIC POLYPS 06/09/2009    Past Surgical History:  Procedure Laterality Date  . ABDOMINAL HYSTERECTOMY      . APPENDECTOMY    . CORONARY ANGIOPLASTY    . TONSILLECTOMY       OB History   No obstetric history on file.     Family History  Problem Relation Age of Onset  . Hypertension Father   . Heart failure Mother   . Hypertension Mother   . Cancer Brother     Social History   Tobacco Use  . Smoking status: Former Smoker    Types: Cigarettes    Quit date: 12/26/1991    Years since quitting: 28.7  . Smokeless tobacco: Never Used  Vaping Use  . Vaping Use: Never used  Substance Use Topics  . Alcohol use: No    Alcohol/week: 0.0 standard drinks  . Drug use: Yes    Types: Methylphenidate    Home Medications Prior to Admission medications   Medication Sig Start Date End Date Taking? Authorizing Provider  amLODipine (NORVASC) 5 MG tablet TAKE 1 TABLET DAILY 07/07/20   Martinique, Peter M, MD  aspirin 81 MG tablet Take 2 tablets (162 mg total) by mouth daily. 12/11/17   Martinique, Peter M, MD  atorvastatin (LIPITOR) 40 MG tablet TAKE 1 TABLET DAILY 07/07/20   Martinique, Peter M, MD  cholecalciferol (  VITAMIN D) 1000 UNITS tablet Take 1,000 Units by mouth 2 (two) times daily.    [provider]  FIBER FORMULA PO Take 1 tablet by mouth daily.    [provider]  hydrochlorothiazide (HYDRODIURIL) 25 MG tablet Take 1 tablet (25 mg total) by mouth daily. 08/20/19   Martinique, Peter M, MD  levocetirizine (XYZAL) 5 MG tablet Take 5 mg by mouth as needed.     [provider]  losartan (COZAAR) 100 MG tablet Take 1 tablet (100 mg total) by mouth daily. 09/17/20 09/12/21  Martinique, Peter M, MD  Melatonin 1 MG CAPS Take 1 capsule by mouth daily.    [provider]  metoprolol succinate (TOPROL-XL) 50 MG 24 hr tablet TAKE 1 TABLET BY MOUTH EVERY DAY WITH OR RIGHT AFTER MEAL 05/23/19   Martinique, Peter M, MD  nitroGLYCERIN (NITROSTAT) 0.4 MG SL tablet Place 1 tablet (0.4 mg total) under the tongue every 5 (five) minutes as needed for chest pain. 03/27/17   Strader, Fransisco Hertz, PA-C     Allergies    Nifedipine, Tetanus toxoids, and Adhesive [tape]  Review of Systems   Review of Systems  Cardiovascular: Positive for chest pain.  All other systems reviewed and are negative.   Physical Exam Updated Vital Signs BP (!) 129/56   Pulse 66   Temp 97.6 F (36.4 C)   Resp 18   Ht 5' (1.524 m)   Wt 54.9 kg   SpO2 97%   BMI 23.63 kg/m   Physical Exam Vitals and nursing note reviewed.  Constitutional:      Comments: Chronically ill   HENT:     Head: Normocephalic.  Eyes:     Pupils: Pupils are equal, round, and reactive to light.  Cardiovascular:     Rate and Rhythm: Normal rate and regular rhythm.     Comments: 2/5 systolic murmur  Pulmonary:     Effort: Pulmonary effort is normal.     Breath sounds: Normal breath sounds.  Abdominal:     General: Bowel sounds are normal.     Palpations: Abdomen is soft.  Musculoskeletal:        General: Normal range of motion.  Skin:    General: Skin is warm.     Capillary Refill: Capillary refill takes less than 2 seconds.  Neurological:     General: No focal deficit present.     Mental Status: She is alert and oriented to person, place, and time.  Psychiatric:        Mood and Affect: Mood normal.        Behavior: Behavior normal.     ED Results / Procedures / Treatments   Labs (all labs ordered are listed, but only abnormal results are displayed) Labs Reviewed  CBC - Abnormal; Notable for the following components:      Result Value   RBC 3.67 (*)    Hemoglobin 7.7 (*)    HCT 27.5 (*)    MCV 74.9 (*)    MCH 21.0 (*)    MCHC 28.0 (*)    RDW 21.1 (*)    All other components within normal limits  BASIC METABOLIC PANEL  MAGNESIUM  TSH  TROPONIN I (HIGH SENSITIVITY)    EKG EKG Interpretation  Date/Time:  Monday September 20 2020 14:31:14 EDT Ventricular Rate:  82 PR Interval:    QRS Duration: 140 QT Interval:  431 QTC Calculation: 504 R Axis:   10 Text Interpretation: Sinus rhythm Right bundle  branch block Confirmed by Pattricia Boss [phone]) on 09/20/2020 2:34:28 PM   Radiology DG Chest Port 1 View  Result Date: 09/20/2020 CLINICAL DATA:  Atrial fibrillation, shortness of breath EXAM: PORTABLE CHEST 1 VIEW COMPARISON:  07/25/2005 FINDINGS: Postsurgical changes to the sternum and mediastinum. Cardiomegaly, increased from remote prior. Atherosclerotic calcification of the aortic knob. Streaky left basilar opacity. Right lung is clear. No pleural effusion or pneumothorax. Degenerative changes of the bilateral shoulders and thoracic spine. IMPRESSION: 1. Streaky left basilar opacity which may represent atelectasis versus infiltrate. 2. Cardiomegaly, increased from remote prior. Electronically Signed   By: Davina Poke D.O.   On: 09/20/2020 15:34    Procedures Procedures (including critical care time)  Medications Ordered in ED Medications  morphine 4 MG/ML injection 4 mg (4 mg Intravenous Given 09/20/20 1541)    ED Course  I have reviewed the triage vital signs and the nursing notes.  Pertinent labs & imaging results that were available during my care of the patient were reviewed by me and considered in my medical decision making (see chart for details).    MDM Rules/Calculators/A&P                         [name] is a 86 y.o. female who presented with chest pain.  Patient appears to be in new onset A. fib.  Reviewed tracing from EMS and she was in A. fib with some ischemic changes.  She received cardioversion now she is back in sinus rhythm.  Patient has no ischemic changes on her current EKG.  Given his history of CAD with stents, will get troponin.  She may also have demand ischemia from getting into rapid A. Fib.  Patient's CHADVASC is 5 and would qualify for anticoagulation but she is a fall risk. Will consult cardiology after labs.    4 pm Labs showed Trop 87, K 3.7. I think elevated trop likely from demand ischemia. Consulted cardiology to see patient.   5:30  PM Cardiology recommend repeat Trop.   7 pm  Cardiology recommend admission. Repeat Trop is 600 likely from demand ischemia. Guiac negative. Since she is more anemic, cardiology wants to hold ASA and any anticoagulation. Hospitalist to admit for anemia, elevated trop, new onset afib.     Final Clinical Impression(s) / ED Diagnoses Final diagnoses:  None    Rx / DC Orders ED Discharge Orders         Ordered    Amb referral to AFIB Clinic        09/20/20 1452           Drenda Freeze, MD 09/20/20 Karl Bales

## 2020-09-20 NOTE — ED Provider Notes (Signed)
MSE was initiated and I personally evaluated the patient and placed orders (if any) at  2:52 PM on September 20, 2020.  The patient appears stable so that the remainder of the MSE may be completed by another provider.  Patient with history of MI, followed by Dr. Martinique, on 2 baby aspirin daily --presents from [facility] [place].  Patient states that she awoke this morning around 3 or 4 AM with chest pain and shortness of breath.  She initially was hoping it would just go away.  Staff was informed later, called sister, called EMS.  Per their report, heart rate up to 190.  Strip at bedside showing heart rate in the 160s.  EMS administered aspirin, fluid bolus.  Patient was cardioverted with 200 J.  Patient does not remember this.  Chest pain resolved after cardioversion.  She does not have a history of atrial fibrillation.  Main complaint currently is chronic pain in her right lower back.  Exam:  Gen NAD; Heart RRR, nml S1,S2, no m/r/g; Lungs CTAB; Abd soft, NT, no rebound or guarding; Ext 2+ pedal pulses bilaterally, no edema, healing wound without overt infection R LE.      Carlisle Cater, PA-C 09/20/20 1454    Pattricia Boss, MD 09/24/20 1330

## 2020-09-21 ENCOUNTER — Telehealth: Payer: Self-pay | Admitting: Cardiology

## 2020-09-21 ENCOUNTER — Inpatient Hospital Stay (HOSPITAL_COMMUNITY): Payer: Medicare Other

## 2020-09-21 ENCOUNTER — Other Ambulatory Visit: Payer: Self-pay | Admitting: Student

## 2020-09-21 DIAGNOSIS — I340 Nonrheumatic mitral (valve) insufficiency: Secondary | ICD-10-CM

## 2020-09-21 DIAGNOSIS — R778 Other specified abnormalities of plasma proteins: Secondary | ICD-10-CM | POA: Insufficient documentation

## 2020-09-21 DIAGNOSIS — I4891 Unspecified atrial fibrillation: Secondary | ICD-10-CM

## 2020-09-21 DIAGNOSIS — I350 Nonrheumatic aortic (valve) stenosis: Secondary | ICD-10-CM

## 2020-09-21 DIAGNOSIS — I361 Nonrheumatic tricuspid (valve) insufficiency: Secondary | ICD-10-CM

## 2020-09-21 LAB — CBC
HCT: 32.3 % — ABNORMAL LOW (ref 36.0–46.0)
Hemoglobin: 9.5 g/dL — ABNORMAL LOW (ref 12.0–15.0)
MCH: 22.6 pg — ABNORMAL LOW (ref 26.0–34.0)
MCHC: 29.4 g/dL — ABNORMAL LOW (ref 30.0–36.0)
MCV: 76.9 fL — ABNORMAL LOW (ref 80.0–100.0)
Platelets: 228 10*3/uL (ref 150–400)
RBC: 4.2 MIL/uL (ref 3.87–5.11)
RDW: 22.3 % — ABNORMAL HIGH (ref 11.5–15.5)
WBC: 5 10*3/uL (ref 4.0–10.5)
nRBC: 0 % (ref 0.0–0.2)

## 2020-09-21 LAB — ECHOCARDIOGRAM COMPLETE
AR max vel: 1.09 cm2
AV Area VTI: 1.07 cm2
AV Area mean vel: 1.04 cm2
AV Mean grad: 20 mmHg
AV Peak grad: 36.5 mmHg
Ao pk vel: 3.02 m/s
Area-P 1/2: 3.37 cm2
Height: 60 in
S' Lateral: 2.7 cm
Weight: 1936 oz

## 2020-09-21 LAB — BASIC METABOLIC PANEL
Anion gap: 9 (ref 5–15)
BUN: 11 mg/dL (ref 8–23)
CO2: 22 mmol/L (ref 22–32)
Calcium: 8.7 mg/dL — ABNORMAL LOW (ref 8.9–10.3)
Chloride: 108 mmol/L (ref 98–111)
Creatinine, Ser: 0.7 mg/dL (ref 0.44–1.00)
GFR calc Af Amer: >60 mL/min (ref >60)
GFR calc non Af Amer: >60 mL/min (ref >60)
Glucose, Bld: 86 mg/dL (ref 70–99)
Potassium: 4 mmol/L (ref 3.5–5.1)
Sodium: 139 mmol/L (ref 135–145)

## 2020-09-21 LAB — URINALYSIS, COMPLETE (UACMP) WITH MICROSCOPIC
Bilirubin Urine: NEGATIVE
Glucose, UA: NEGATIVE mg/dL
Ketones, ur: NEGATIVE mg/dL
Nitrite: POSITIVE — AB
Protein, ur: NEGATIVE mg/dL
Specific Gravity, Urine: 1.008 (ref 1.005–1.030)
WBC, UA: >50 WBC/hpf — ABNORMAL HIGH (ref 0–5)
pH: 7 (ref 5.0–8.0)

## 2020-09-21 LAB — VITAMIN B12: Vitamin B-12: 312 pg/mL (ref 180–914)

## 2020-09-21 LAB — TSH: TSH: 1.442 u[IU]/mL (ref 0.350–4.500)

## 2020-09-21 MED ORDER — DIPHENHYDRAMINE HCL 50 MG/ML IJ SOLN
25.0000 mg | Freq: Once | INTRAMUSCULAR | Status: AC
  Administered 2020-09-21: 25 mg via INTRAVENOUS

## 2020-09-21 MED ORDER — HYDROCORTISONE NA SUCCINATE PF 100 MG IJ SOLR
50.0000 mg | Freq: Once | INTRAMUSCULAR | Status: AC
  Administered 2020-09-21: 50 mg via INTRAVENOUS
  Filled 2020-09-21: qty 2

## 2020-09-21 MED ORDER — CYCLOBENZAPRINE HCL 5 MG PO TABS
2.5000 mg | ORAL_TABLET | Freq: Once | ORAL | Status: AC
  Administered 2020-09-21: 2.5 mg via ORAL
  Filled 2020-09-21: qty 0.5

## 2020-09-21 MED ORDER — SODIUM CHLORIDE 0.9 % IV SOLN
510.0000 mg | Freq: Once | INTRAVENOUS | Status: AC
  Administered 2020-09-21: 510 mg via INTRAVENOUS
  Filled 2020-09-21: qty 17

## 2020-09-21 MED ORDER — SENNOSIDES-DOCUSATE SODIUM 8.6-50 MG PO TABS: 1.0000 | ORAL_TABLET | Freq: Every evening | ORAL | Status: DC | PRN

## 2020-09-21 MED ORDER — ATORVASTATIN CALCIUM 40 MG PO TABS
40.0000 mg | ORAL_TABLET | Freq: Every day | ORAL | Status: DC
  Administered 2020-09-21 – 2020-09-22 (×2): 40 mg via ORAL
  Filled 2020-09-21 (×2): qty 1

## 2020-09-21 MED ORDER — LOSARTAN POTASSIUM 50 MG PO TABS
100.0000 mg | ORAL_TABLET | Freq: Every day | ORAL | Status: DC
  Administered 2020-09-21 – 2020-09-22 (×2): 100 mg via ORAL
  Filled 2020-09-21 (×2): qty 2

## 2020-09-21 MED ORDER — AMLODIPINE BESYLATE 5 MG PO TABS
5.0000 mg | ORAL_TABLET | Freq: Every day | ORAL | Status: DC
  Administered 2020-09-21 – 2020-09-22 (×2): 5 mg via ORAL
  Filled 2020-09-21 (×2): qty 1

## 2020-09-21 MED ORDER — DM-GUAIFENESIN ER 30-600 MG PO TB12
1.0000 | ORAL_TABLET | Freq: Two times a day (BID) | ORAL | Status: DC | PRN
  Filled 2020-09-21: qty 1

## 2020-09-21 NOTE — ED Notes (Signed)
Attempted to call report to the floor. RN unable to take report.

## 2020-09-21 NOTE — Telephone Encounter (Signed)
Patient's sister states she is returning a call regarding lab results.

## 2020-09-21 NOTE — Progress Notes (Signed)
Ordered outpatient Event Monitor to assess atrial fibrillation burden per Dr. Radford Pax. Please see her rounding note from today for more information.  Darreld Mclean, PA-C 09/21/2020 3:18 PM

## 2020-09-21 NOTE — ED Notes (Signed)
Pt was seen climbing out of bed with the monitor cords pulling in many directions, pt was standing in urine. Her call bell was beside her & the purewick was laying beside her on the bed. Pt is A/Ox3-disoriented to situation, verbal. Pt assisted back to bed, peri-care given, repositioned for comfort, purewick replaced per pt request.

## 2020-09-21 NOTE — ED Notes (Signed)
Safety sitter is at bedside.

## 2020-09-21 NOTE — ED Notes (Signed)
Pt continues to thrash around in the bed, reporting leg cramping.  Provider aware.

## 2020-09-21 NOTE — Progress Notes (Signed)
PROGRESS NOTE    [name]  IOE:[id number] DOB: 01-Oct-1929 DOA: 09/20/2020 PCP: [name], MD   Brief Narrative:  86 year old with history of CAD status post stent, moderate aortic stenosis, HTN, HLD, GERD, scoliosis, depression, osteoporosis, chronic pain admitted for chest pain and new onset atrial fibrillation with RVR.  When EMS arrived on scene at her independent living her heart rate was greater than 190 which required cardioversion and restoration of normal sinus rhythm.  In the ER she had some EKG changes, mildly elevated troponin, chest x-ray with some left basilar opacity.   Assessment & Plan:   Principal Problem:   Symptomatic anemia Active Problems:   GERD   CAD (coronary artery disease)   HTN (hypertension)   Hypercholesteremia   Anxiety and depression   Aortic stenosis, mild   Atrial fibrillation with RVR (HCC)  Atrial fibrillation with RVR Chest pain likely from demand ischemia -Heart rate is improved. -Cardiology team following.  Closely monitor electrolytes and replete as necessary -Repeat echocardiogram -Toprol 50 mg daily. -Holding off and anticoagulation until her ongoing anemia has been evaluated. -Troponins remain flat  Microcytic anemia, severe iron deficiency -Baseline hemoglobin around 10.0.  Admission hemoglobin 7.7.  Posttransfusion hemoglobin 9.5 -2 units PRBC ordered but after her second unit she started having severe itching therefore will stop the Benadryl was given overnight. -Hemoccult-negative -Iron studies-severe iron deficiency -IV iron -Colonoscopy 2010-multiple polyps and sigmoid diverticulosis  Bacteriuria, asymptomatic -Order urine cultures.  No evidence of leukocytosis or symptoms therefore holding off on antibiotics.  Low threshold to start.  History of coronary artery disease status post PCI Moderate aortic stenosis -On beta-blocker and statin -Aspirin on hold -Repeat echocardiogram-pending  Essential hypertension,  uncontrolled -Toprol-XL 50 mg daily.  Resume Norvasc and losartan.  Hydrochlorothiazide on hold  Hyperlipidemia -40 mg Lipitor  Anxiety with depression -Continue home meds   DVT prophylaxis: SCDs Code Status: Full code Family Communication: Sister at bedside  Status is: Inpatient  Remains inpatient appropriate because:Inpatient level of care appropriate due to severity of illness   Dispo: The patient is from: Independent living              Anticipated d/c is to: Independent              Anticipated d/c date is: 2 days              Patient currently is not medically stable to d/c.  Need to make sure hemoglobin remained stable.  In the meantime undergoing evaluation for atrial fibrillation with RVR    Body mass index is 23.63 kg/m.      Subjective: Just prior to my evaluation patient was quite agitated trying to get out of bed.  Sitter was ordered.  When I saw the patient she was resting comfortably did not have any complaints.  Denies any burning with during urination.  She was pleasantly confused.  Sister was present at bedside as well.  Review of Systems Patient did not engage much in conversation and denied all other complaints.  Examination:  Constitutional: Not in acute distress, elderly frail Respiratory: Minimal bibasilar rhonchi Cardiovascular: Normal sinus rhythm, no rubs Abdomen: Nontender nondistended good bowel sounds Musculoskeletal: No edema noted Skin: Right lower extremity anterior shin scab noted with surrounding erythema but no gross evidence of cellulitis/infection. Neurologic: Grossly moving all the extremities Psychiatric: Alert to name only  Objective: Vitals:   09/21/20 0545 09/21/20 0600 09/21/20 0630 09/21/20 0730  BP: (!) 167/108 (!) 153/117 [name] Kitchen)  145/91 (!) 180/78  Pulse: 70 76 73 65  Resp: 20 15 19 18   Temp:      TempSrc:      SpO2: 96% 95% 93% 97%  Weight:      Height:       No intake or output data in the 24 hours ending  09/21/20 0835 Filed Weights   09/20/20 1428  Weight: 54.9 kg     Data Reviewed:   CBC: Recent Labs  Lab 09/17/20 1559 09/20/20 1452 09/21/20 0529  WBC 5.1 4.6 5.0  NEUTROABS 3.4  --   --   HGB 9.1* 7.7* 9.5*  HCT 30.5* 27.5* 32.3*  MCV 72* 74.9* 76.9*  PLT 262 213 440   Basic Metabolic Panel: Recent Labs  Lab 09/17/20 1559 09/20/20 1452 09/21/20 0529  NA 139 140 139  K 4.2 3.7 4.0  CL 104 109 108  CO2 24 21* 22  GLUCOSE 85 118* 86  BUN 14 16 11   CREATININE 0.74 0.78 0.70  CALCIUM 9.0 8.1* 8.7*  MG  --  2.0  --    GFR: Estimated Creatinine Clearance: 37.8 mL/min (by C-G formula based on SCr of 0.7 mg/dL). Liver Function Tests: Recent Labs  Lab 09/17/20 1559  AST 22  ALT 16  ALKPHOS 96  BILITOT 0.2  PROT 6.7  ALBUMIN 3.9   No results for input(s): LIPASE, AMYLASE in the last 168 hours. No results for input(s): AMMONIA in the last 168 hours. Coagulation Profile: No results for input(s): INR, PROTIME in the last 168 hours. Cardiac Enzymes: No results for input(s): CKTOTAL, CKMB, CKMBINDEX, TROPONINI in the last 168 hours. BNP (last 3 results) No results for input(s): PROBNP in the last 8760 hours. HbA1C: No results for input(s): HGBA1C in the last 72 hours. CBG: No results for input(s): GLUCAP in the last 168 hours. Lipid Profile: No results for input(s): CHOL, HDL, LDLCALC, TRIG, CHOLHDL, LDLDIRECT in the last 72 hours. Thyroid Function Tests: Recent Labs    09/20/20 1453  TSH 3.738   Anemia Panel: Recent Labs    09/20/20 1806  FERRITIN 7*  TIBC 420  IRON 13*   Sepsis Labs: No results for input(s): PROCALCITON, LATICACIDVEN in the last 168 hours.  Recent Results (from the past 240 hour(s))  Respiratory Panel by RT PCR (Flu A&B, Covid) - Nasopharyngeal Swab     Status: None   Collection Time: 09/20/20  7:55 PM   Specimen: Nasopharyngeal Swab  Result Value Ref Range Status   SARS Coronavirus 2 by RT PCR NEGATIVE NEGATIVE Final     Comment: (NOTE) SARS-CoV-2 target nucleic acids are NOT DETECTED.  The SARS-CoV-2 RNA is generally detectable in upper respiratoy specimens during the acute phase of infection. The lowest concentration of SARS-CoV-2 viral copies this assay can detect is 131 copies/mL. A negative result does not preclude SARS-Cov-2 infection and should not be used as the sole basis for treatment or other patient management decisions. A negative result may occur with  improper specimen collection/handling, submission of specimen other than nasopharyngeal swab, presence of viral mutation(s) within the areas targeted by this assay, and inadequate number of viral copies (<131 copies/mL). A negative result must be combined with clinical observations, patient history, and epidemiological information. The expected result is Negative.  Fact Sheet for Patients:  PinkCheek.be  Fact Sheet for Healthcare Providers:  GravelBags.it  This test is no t yet approved or cleared by the [place] FDA and  has been authorized for detection and/or  diagnosis of SARS-CoV-2 by FDA under an Emergency Use Authorization (EUA). This EUA will remain  in effect (meaning this test can be used) for the duration of the COVID-19 declaration under Section 564(b)(1) of the Act, 21 U.S.C. section 360bbb-3(b)(1), unless the authorization is terminated or revoked sooner.     Influenza A by PCR NEGATIVE NEGATIVE Final   Influenza B by PCR NEGATIVE NEGATIVE Final    Comment: (NOTE) The Xpert Xpress SARS-CoV-2/FLU/RSV assay is intended as an aid in  the diagnosis of influenza from Nasopharyngeal swab specimens and  should not be used as a sole basis for treatment. Nasal washings and  aspirates are unacceptable for Xpert Xpress SARS-CoV-2/FLU/RSV  testing.  Fact Sheet for Patients: PinkCheek.be  Fact Sheet for Healthcare  Providers: GravelBags.it  This test is not yet approved or cleared by the [place] FDA and  has been authorized for detection and/or diagnosis of SARS-CoV-2 by  FDA under an Emergency Use Authorization (EUA). This EUA will remain  in effect (meaning this test can be used) for the duration of the  Covid-19 declaration under Section 564(b)(1) of the Act, 21  U.S.C. section 360bbb-3(b)(1), unless the authorization is  terminated or revoked. Performed at [facility], [place] [address] [place], [place] [address]          Radiology Studies: DG Chest Port 1 View  Result Date: 09/20/2020 CLINICAL DATA:  Atrial fibrillation, shortness of breath EXAM: PORTABLE CHEST 1 VIEW COMPARISON:  07/25/2005 FINDINGS: Postsurgical changes to the sternum and mediastinum. Cardiomegaly, increased from remote prior. Atherosclerotic calcification of the aortic knob. Streaky left basilar opacity. Right lung is clear. No pleural effusion or pneumothorax. Degenerative changes of the bilateral shoulders and thoracic spine. IMPRESSION: 1. Streaky left basilar opacity which may represent atelectasis versus infiltrate. 2. Cardiomegaly, increased from remote prior. Electronically Signed   By: Davina Poke D.O.   On: 09/20/2020 15:34        Scheduled Meds: . metoprolol succinate  50 mg Oral Daily   Continuous Infusions:   LOS: 1 day   Time spent= 35 mins    Maryjean Corpening Arsenio Loader, MD [facility]  If 7PM-7AM, please contact night-coverage  09/21/2020, 8:35 AM

## 2020-09-21 NOTE — Progress Notes (Addendum)
Progress Note  Patient Name: [name] Date of Encounter: 09/21/2020  Primary Cardiologist: [name], MD   Subjective   Confused this am.  I found her completely naked having taken off her hospital gown and took her EKG leads, BP cuff and pulse ox off and out of the bed in her ER room.  She said she needed to go the the bathroom even though she has a urine wick in.  Denies any chest pain or SOB.   Inpatient Medications    Scheduled Meds:  amLODipine  5 mg Oral Daily   atorvastatin  40 mg Oral Daily   losartan  100 mg Oral Daily   metoprolol succinate  50 mg Oral Daily   Continuous Infusions:  PRN Meds: acetaminophen, dextromethorphan-guaiFENesin, ondansetron (ZOFRAN) IV, senna-docusate   Vital Signs    Vitals:   09/21/20 0730 09/21/20 1000 09/21/20 1041 09/21/20 1145  BP: (!) 180/78 (!) 196/81 (!) 178/92 (!) 187/72  Pulse: 65 (!) 57 70 (!) 58  Resp: 18 12 15 19   Temp:      TempSrc:      SpO2: 97% 94% 92% 93%  Weight:      Height:       No intake or output data in the 24 hours ending 09/21/20 1451 Filed Weights   09/20/20 1428  Weight: 54.9 kg    Telemetry    NSR - Personally Reviewed  ECG    No new EKG to review - Personally Reviewed  Physical Exam   GEN: confused and up out of bed after taking off her clothes and all EKG leads, Bp cuff and poulse ox Neck: No JVD Cardiac: RRR, no murmurs, rubs, or gallops.  Respiratory: Clear to auscultation bilaterally. GI: Soft, nontender, non-distended  MS: No edema; No deformity. Neuro:  Nonfocal  Psych: Normal affect   Labs    Chemistry Recent Labs  Lab 09/17/20 1559 09/20/20 1452 09/21/20 0529  NA 139 140 139  K 4.2 3.7 4.0  CL 104 109 108  CO2 24 21* 22  GLUCOSE 85 118* 86  BUN 14 16 11   CREATININE 0.74 0.78 0.70  CALCIUM 9.0 8.1* 8.7*  PROT 6.7  --   --   ALBUMIN 3.9  --   --   AST 22  --   --   ALT 16  --   --   ALKPHOS 96  --   --   BILITOT 0.2  --   --   GFRNONAA 73 >60 >60    GFRAA 84 >60 >60  ANIONGAP  --  10 9     Hematology Recent Labs  Lab 09/17/20 1559 09/20/20 1452 09/21/20 0529  WBC 5.1 4.6 5.0  RBC 4.22 3.67* 4.20  HGB 9.1* 7.7* 9.5*  HCT 30.5* 27.5* 32.3*  MCV 72* 74.9* 76.9*  MCH 21.6* 21.0* 22.6*  MCHC 29.8* 28.0* 29.4*  RDW 19.1* 21.1* 22.3*  PLT 262 213 228    Cardiac EnzymesNo results for input(s): TROPONINI in the last 168 hours. No results for input(s): TROPIPOC in the last 168 hours.   BNPNo results for input(s): BNP, PROBNP in the last 168 hours.   DDimer No results for input(s): DDIMER in the last 168 hours.   Radiology    DG Chest Port 1 View  Result Date: 09/20/2020 CLINICAL DATA:  Atrial fibrillation, shortness of breath EXAM: PORTABLE CHEST 1 VIEW COMPARISON:  07/25/2005 FINDINGS: Postsurgical changes to the sternum and mediastinum. Cardiomegaly, increased from remote  prior. Atherosclerotic calcification of the aortic knob. Streaky left basilar opacity. Right lung is clear. No pleural effusion or pneumothorax. Degenerative changes of the bilateral shoulders and thoracic spine. IMPRESSION: 1. Streaky left basilar opacity which may represent atelectasis versus infiltrate. 2. Cardiomegaly, increased from remote prior. Electronically Signed   By: Davina Poke D.O.   On: 09/20/2020 15:34   ECHOCARDIOGRAM COMPLETE  Result Date: 09/21/2020    ECHOCARDIOGRAM REPORT   Patient Name:   [name] Date of Exam: 09/21/2020 Medical Rec #:  [id number]     Height:       60.0 in Accession #:    [id number]    Weight:       121.0 lb Date of Birth:  02-May-1929    BSA:          1.508 m Patient Age:    86 years      BP:           187/72 mmHg Patient Gender: F             HR:           59 bpm. Exam Location:  Inpatient Procedure: 2D Echo, Cardiac Doppler and Color Doppler Indications:    Atrial fibrillation  History:        Patient has prior history of Echocardiogram examinations, most                 recent 09/30/2018. Previous Myocardial  Infarction and CAD, Aortic                 Valve Disease; Risk Factors:Hypertension, Dyslipidemia and GERD.  Sonographer:    Tiffany Dance Referring Phys: [id number] Darreld Mclean  Sonographer Comments: No subcostal window. IMPRESSIONS  1. Left ventricular ejection fraction, by estimation, is 65 to 70%. The left ventricle has normal function. The left ventricle has no regional wall motion abnormalities. There is mild left ventricular hypertrophy. Left ventricular diastolic parameters are consistent with Grade I diastolic dysfunction (impaired relaxation). Elevated left ventricular end-diastolic pressure. The E/e' is 46.  2. Right ventricular systolic function is normal. The right ventricular size is normal. There is mildly elevated pulmonary artery systolic pressure. The estimated right ventricular systolic pressure is 24.2 mmHg.  3. Left atrial size was severely dilated.  4. Right atrial size was mildly dilated.  5. The mitral valve is abnormal. Mild mitral valve regurgitation.  6. The aortic valve is tricuspid. Aortic valve regurgitation is not visualized. Moderate aortic valve stenosis. Aortic valve area, by VTI measures 1.07 cm. Aortic valve mean gradient measures 20.0 mmHg. Aortic valve Vmax measures 3.02 m/s. Peak gradient 37 mmHg. DI 0.34  7. The inferior vena cava is normal in size with <50% respiratory variability, suggesting right atrial pressure of 8 mmHg. Comparison(s): Prior images unable to be directly viewed, comparison made by report only. Changes from prior study are noted. 09/30/2018: LVEF 60-65%, mild to moderate AS -mean/peak gradient 13/26 mmHg. FINDINGS  Left Ventricle: Left ventricular ejection fraction, by estimation, is 65 to 70%. The left ventricle has normal function. The left ventricle has no regional wall motion abnormalities. The left ventricular internal cavity size was normal in size. There is  mild left ventricular hypertrophy. Left ventricular diastolic parameters are consistent  with Grade I diastolic dysfunction (impaired relaxation). Elevated left ventricular end-diastolic pressure. The E/e' is 15. Right Ventricle: The right ventricular size is normal. No increase in right ventricular wall thickness. Right ventricular systolic function is normal. There is mildly  elevated pulmonary artery systolic pressure. The tricuspid regurgitant velocity is 3.03  m/s, and with an assumed right atrial pressure of 8 mmHg, the estimated right ventricular systolic pressure is 03.5 mmHg. Left Atrium: Left atrial size was severely dilated. Right Atrium: Right atrial size was mildly dilated. Pericardium: There is no evidence of pericardial effusion. Mitral Valve: The mitral valve is abnormal. There is mild thickening of the mitral valve leaflet(s). Mild mitral annular calcification. Mild mitral valve regurgitation. Tricuspid Valve: The tricuspid valve is grossly normal. Tricuspid valve regurgitation is mild. Aortic Valve: The aortic valve is tricuspid. Aortic valve regurgitation is not visualized. Moderate aortic stenosis is present. Aortic valve mean gradient measures 20.0 mmHg. Aortic valve peak gradient measures 36.5 mmHg. Aortic valve area, by VTI measures 1.07 cm. Pulmonic Valve: The pulmonic valve was normal in structure. Pulmonic valve regurgitation is not visualized. Aorta: The aortic root and ascending aorta are structurally normal, with no evidence of dilitation. Venous: The inferior vena cava is normal in size with less than 50% respiratory variability, suggesting right atrial pressure of 8 mmHg. IAS/Shunts: No atrial level shunt detected by color flow Doppler.  LEFT VENTRICLE PLAX 2D LVIDd:         3.60 cm  Diastology LVIDs:         2.70 cm  LV e' medial:    6.74 cm/s LV PW:         1.00 cm  LV E/e' medial:  15.6 LV IVS:        1.20 cm  LV e' lateral:   6.96 cm/s LVOT diam:     2.00 cm  LV E/e' lateral: 15.1 LV SV:         83 LV SV Index:   55 LVOT Area:     3.14 cm  RIGHT VENTRICLE RV Basal  diam:  2.60 cm RV S prime:     11.70 cm/s TAPSE (M-mode): 2.2 cm LEFT ATRIUM             Index       RIGHT ATRIUM           Index LA diam:        3.30 cm 2.19 cm/m  RA Area:     19.20 cm LA Vol (A2C):   85.3 ml 56.58 ml/m RA Volume:   51.30 ml  34.03 ml/m LA Vol (A4C):   56.6 ml 37.54 ml/m LA Biplane Vol: 75.4 ml 50.01 ml/m  AORTIC VALVE AV Area (Vmax):    1.09 cm AV Area (Vmean):   1.04 cm AV Area (VTI):     1.07 cm AV Vmax:           302.00 cm/s AV Vmean:          212.000 cm/s AV VTI:            0.780 m AV Peak Grad:      36.5 mmHg AV Mean Grad:      20.0 mmHg LVOT Vmax:         105.00 cm/s LVOT Vmean:        70.000 cm/s LVOT VTI:          0.265 m LVOT/AV VTI ratio: 0.34  AORTA Ao Root diam: 3.40 cm Ao Asc diam:  2.70 cm MITRAL VALVE                TRICUSPID VALVE MV Area (PHT): 3.37 cm     TR Peak grad:   36.7 mmHg MV Decel  Time: 225 msec     TR Vmax:        303.00 cm/s MV E velocity: 105.00 cm/s MV A velocity: 98.50 cm/s   SHUNTS MV E/A ratio:  1.07         Systemic VTI:  0.26 m                             Systemic Diam: 2.00 cm Lyman Bishop MD Electronically signed by Lyman Bishop MD Signature Date/Time: 09/21/2020/12:40:45 PM    Final     Cardiac Studies   2D echo 09/21/2020 IMPRESSIONS    1. Left ventricular ejection fraction, by estimation, is 65 to 70%. The  left ventricle has normal function. The left ventricle has no regional  wall motion abnormalities. There is mild left ventricular hypertrophy.  Left ventricular diastolic parameters  are consistent with Grade I diastolic dysfunction (impaired relaxation).  Elevated left ventricular end-diastolic pressure. The E/e' is 68.  2. Right ventricular systolic function is normal. The right ventricular  size is normal. There is mildly elevated pulmonary artery systolic  pressure. The estimated right ventricular systolic pressure is 29.5 mmHg.  3. Left atrial size was severely dilated.  4. Right atrial size was mildly dilated.    5. The mitral valve is abnormal. Mild mitral valve regurgitation.  6. The aortic valve is tricuspid. Aortic valve regurgitation is not  visualized. Moderate aortic valve stenosis. Aortic valve area, by VTI  measures 1.07 cm. Aortic valve mean gradient measures 20.0 mmHg. Aortic  valve Vmax measures 3.02 m/s. Peak  gradient 37 mmHg. DI 0.34  7. The inferior vena cava is normal in size with <50% respiratory  variability, suggesting right atrial pressure of 8 mmHg.   Comparison(s): Prior images unable to be directly viewed, comparison made  by report only. Changes from prior study are noted. 09/30/2018: LVEF  60-65%, mild to moderate AS -mean/peak gradient 13/26 mmHg.   Patient Profile     86 y.o. female with a history of CAD s/p prior stenting to D2 in 1992 and RCA in 2005, mild to moderate aortic stenosis, hypertension, hyperlipidemia, GERD, chronic back pain related to scoliosis, anxiety, and depression who is being seen for the evaluation of chest pain in the setting of new onset atrial fibrillation with RVR at the request of Dr. Darl Householder.  Assessment & Plan    Atrial Fibrillation with RVR - Upon EMS arrival, patient in atrial fibrillation with rates in 190's. Patient's successful cardioverted with restoration of sinus rhythm in the field. - she continues to remain in NSR with no further atrial fibrillation - TSH normal.  - suspect that severe anemia was inciting event leading to Afib but also has severe LAE so has substrate for afib - 2D echo with normal LVF and severe LAE and moderate AS - Continue home Toprol-XL 50mg  daily.  HR in the 50-80's - CHA2DS2-VASc = 5 (CAD, HTN, age x2, female). However, would not anticoagulate at this time given acute anemia until GI evaluation completed.  I am very concerned that she is at increased risk of anticoagulation due to confusion and increased risk of falls. - Patient also ambulates with a walk and reports feeling more unsteady on her feet. -if  she can be watched 24/7 with a caregiver then I think that anticoagulation would be safe but if she cannot have a sitter at home, then I think anticoagulation would be high risk.  Will defer final  decision to [facility] based on findings from GI and TRH assessment of living situation with caregiving access. -could consider PT consult  Chest Pain/Elevated Troponin/Demand Ischemia - this occurred in the setting of atrial fibrillation with RVR, moderate AS and severe anemia as well as DCCV to NSR - hsTrop 87>603 - Chest pain resolved with restoration of sinus rhythm. - 2D echo with normal LVF EF 65-70% with no RWMAs - findings c/w demand ischemia from moderate AS, afib with RVR/DCCV and severe anemia - she just saw Dr. Martinique a few days ago and was doing well -would not pursue inpt ischemic evaluation and have her followup with Dr. Martinique outpt to discuss stress testing  CAD - History of prior stenting to D2 in 1992 and RCA in 2005. - Chest pain resolved with restoration of sinus rhythm.  - Continue beta-blocker and statin. Hold aspirin for now given acute anemia.  Aortic Stenosis - Mild to moderate AS on last Echo in 09/2018.  - moderate AS this echo with mean AVG increased from 13>23mmHg -continue to monitor outpt  Acute Anemia - Hemoglobin 7.7 on arrival, down from 9.1 on 09/17/2020 and 13.8 from 09/2019.  - Patient denies any abnormal bleeding. - Hbg up to 9.5 after PRBCs  - Fecal hemoccult negative - Iron low at 13, Ferritin very low at 7 c/w iron def anemia - Recommend GI work-up.   Hypertension - BP elevated today at 187/81mmHg.  - Continue Toprol-XL 50mg  daily.  - amlodipine 5mg  daily restarted today as well has home dose of Losartan 100mg  daily  Hyperlipidemia - Continue Lipitor 40mg  daily.   I have spent a total of 35 minutes with patient reviewing 2D echo, assisting nurses getting patient redressed and hooked back up to tele , telemetry, EKGs, labs and examining patient as  well as establishing an assessment and plan that was discussed with the patient.  > 50% of time was spent in direct patient care.    [facility] will sign off.   Medication Recommendations:  Toprol XL 50mg  daily, Losartan 100mg  daily, Amlodipine 5mg  daily.  See notes above regarding anticoagulation.  Other recommendations (labs, testing, etc):  30 day event monitor to evaluate PAF load Follow up as an outpatient:  Dr. Martinique in 1-2 weeks  For questions or updates, please contact [facility] Please consult [email or web address] for contact info under Cardiology/STEMI.      Signed, Fransico Him, MD  09/21/2020, 2:51 PM

## 2020-09-21 NOTE — ED Notes (Signed)
Pt was found again climbing out of bed, with no clothes on, monitor was removed & her brief was wet & purewick was removed again by patient. Dr. Radford Pax witnessed this as well. Pt was remains A/Ox3 disoriented to situation & not able to give a reason on what she was doing in the moment. Pt was repositioned for comfort, Dr. Radford Pax did her assessment & recommended this pt receive a safety sitter. Order has been put in, pt will be monitored closely.

## 2020-09-21 NOTE — ED Notes (Addendum)
Provider bedside, ordered mustard on bread for pt and Flexeril.  Pt reports throat is dry and scratchy. Dr Roxanne Mins checked pt's gag reflex and agreed pt can have mustard on bread.  Given successfully

## 2020-09-21 NOTE — ED Notes (Signed)
527 - pt reports itching all over, especially on arms and legs.  Blood stopped.  ED provider notified and ordered benedryl which was given.  New fluids started.  RN continues to monitor.

## 2020-09-21 NOTE — ED Notes (Signed)
Sister at bedside.

## 2020-09-21 NOTE — Progress Notes (Signed)
  Echocardiogram 2D Echocardiogram has been performed.  [name] 09/21/2020, 12:27 PM

## 2020-09-21 NOTE — ED Notes (Signed)
Pt out of bed without assistance, IV out and lying on the floor. Blood transfusion paused, new started, blood restarted.

## 2020-09-21 NOTE — Telephone Encounter (Signed)
Spoke to patient's sister Fraser Din.She stated patient was sent to [facility] ED yesterday with Afib.She was admitted.Stated lab work revealed low hgb and more test are being done.Advised to follow up with PCP Dr.Shaw.I will make Dr.Jordan aware.

## 2020-09-22 LAB — CBC
HCT: 33.9 % — ABNORMAL LOW (ref 36.0–46.0)
Hemoglobin: 10.2 g/dL — ABNORMAL LOW (ref 12.0–15.0)
MCH: 23.1 pg — ABNORMAL LOW (ref 26.0–34.0)
MCHC: 30.1 g/dL (ref 30.0–36.0)
MCV: 76.7 fL — ABNORMAL LOW (ref 80.0–100.0)
Platelets: 221 10*3/uL (ref 150–400)
RBC: 4.42 MIL/uL (ref 3.87–5.11)
RDW: 22.6 % — ABNORMAL HIGH (ref 11.5–15.5)
WBC: 4.4 10*3/uL (ref 4.0–10.5)
nRBC: 0 % (ref 0.0–0.2)

## 2020-09-22 LAB — TYPE AND SCREEN
ABO/RH(D): B POS
Antibody Screen: NEGATIVE
Unit division: 0
Unit division: 0

## 2020-09-22 LAB — BPAM RBC
Blood Product Expiration Date: 202110222359
Blood Product Expiration Date: 202110222359
ISSUE DATE / TIME: 202109272250
ISSUE DATE / TIME: 202109280435
Unit Type and Rh: 7300
Unit Type and Rh: 7300

## 2020-09-22 LAB — BASIC METABOLIC PANEL
Anion gap: 7 (ref 5–15)
BUN: 8 mg/dL (ref 8–23)
CO2: 25 mmol/L (ref 22–32)
Calcium: 8.7 mg/dL — ABNORMAL LOW (ref 8.9–10.3)
Chloride: 107 mmol/L (ref 98–111)
Creatinine, Ser: 0.6 mg/dL (ref 0.44–1.00)
GFR calc Af Amer: >60 mL/min (ref >60)
GFR calc non Af Amer: >60 mL/min (ref >60)
Glucose, Bld: 89 mg/dL (ref 70–99)
Potassium: 3.5 mmol/L (ref 3.5–5.1)
Sodium: 139 mmol/L (ref 135–145)

## 2020-09-22 LAB — TRANSFUSION REACTION
DAT C3: NEGATIVE
Post RXN DAT IgG: NEGATIVE

## 2020-09-22 LAB — MAGNESIUM: Magnesium: 2 mg/dL (ref 1.7–2.4)

## 2020-09-22 LAB — FOLATE RBC
Folate, Hemolysate: 371 ng/mL
Folate, RBC: 1042 ng/mL (ref >498)
Hematocrit: 35.6 % (ref 34.0–46.6)

## 2020-09-22 MED ORDER — FERROUS SULFATE 325 (65 FE) MG PO TABS: 325.0000 mg | ORAL_TABLET | Freq: Every day | ORAL | 3 refills | Status: DC

## 2020-09-22 MED ORDER — AMLODIPINE BESYLATE 10 MG PO TABS
10.0000 mg | ORAL_TABLET | Freq: Every day | ORAL | 0 refills | Status: DC
Start: 2020-09-23 — End: 2021-12-15

## 2020-09-22 MED ORDER — SENNOSIDES-DOCUSATE SODIUM 8.6-50 MG PO TABS: 1.0000 | ORAL_TABLET | Freq: Every evening | ORAL | 0 refills | Status: DC | PRN

## 2020-09-22 MED ORDER — AMLODIPINE BESYLATE 10 MG PO TABS
10.0000 mg | ORAL_TABLET | Freq: Every day | ORAL | Status: DC
  Administered 2020-09-22: 10 mg via ORAL
  Filled 2020-09-22: qty 1

## 2020-09-22 MED FILL — FERROUS SULFATE 325 MG TAB: 325 (65 FE) | 30 days supply | Qty: 30 | Fill #0

## 2020-09-22 MED FILL — AMLODIPINE BESYLATE 10 MG T: 10 | 30 days supply | Qty: 30 | Fill #0

## 2020-09-22 MED FILL — SENEXON-S 8.6-50 MG TABS: 8.6-50 | 30 days supply | Qty: 30 | Fill #0

## 2020-09-22 NOTE — Plan of Care (Signed)

## 2020-09-22 NOTE — Evaluation (Signed)
Occupational Therapy Evaluation Patient Details Name: [name] MRN: [id number] DOB: July 08, 1929 Today's Date: 09/22/2020    History of Present Illness [name] is a 86 y.o. female with medical history significant of coronary artery disease status post stenting in 1992.  Also another stent 2005 to RCA, moderate aortic stenosis, hypertension, hyperlipidemia, chronic pain and GERD, scoliosis, depression with anxiety, osteoporosis, GERD who came in today with chest pain and new A. fib with RVR.  EMS apparently responded to her call.  Noted that patient was in A. fib with RVR and cardioverted her.  She has since been having anterior chest wall pain.  She was seen in the ER and cardiology consulted.  Patient was evaluated and at this point based on her recent visit with Dr. Martinique 3 days ago patient does not appear to be having you cardiac event.  She had an echo 3 years ago with EF of 60 to 65%.  No abnormal changes to the EKG.  Patient notes anticoagulated.  She is currently back in sinus rhythm and staying in sinus rhythm.  We will be admitting patient due to a resolved anemia.  Most likely symptomatic anemia leading to her tachycardia and then A. fib with RVR also..   Clinical Impression   Pt. Ws seen for skilled OT evaluatoin. Pt. Was able to perform ADLs and ADL transfers at Mod I level. Pt. Lives in I living and does her own breakfest, lunch, and takes dinner in dining room. Pt. States she also cleans her apartment. Pt. May benefit from [facility] to assess for needs at home but pt. Does not want it. Pt. States she is good and ready to go home. No further OT per pt. Request.     Follow Up Recommendations  No OT follow up (Pt. does not want home health OT)    Equipment Recommendations  None recommended by OT    Recommendations for Other Services       Precautions / Restrictions Precautions Precautions: Fall Restrictions Weight Bearing Restrictions: No      Mobility Bed  Mobility Overal bed mobility: Modified Independent                Transfers Overall transfer level: Modified independent Equipment used: Rolling walker (2 wheeled)                  Balance                                           ADL either performed or assessed with clinical judgement   ADL Overall ADL's : Modified independent                                             Vision Baseline Vision/History: Wears glasses Wears Glasses: Reading only Patient Visual Report: No change from baseline Vision Assessment?: No apparent visual deficits     Perception     Praxis      Pertinent Vitals/Pain Pain Assessment: No/denies pain     Hand Dominance Right   Extremity/Trunk Assessment Upper Extremity Assessment Upper Extremity Assessment: Generalized weakness   Lower Extremity Assessment Lower Extremity Assessment: Defer to PT evaluation       Communication Communication Communication: No difficulties   Cognition Arousal/Alertness: Awake/alert  Behavior During Therapy: WFL for tasks assessed/performed Overall Cognitive Status: Within Functional Limits for tasks assessed                                     General Comments       Exercises     Shoulder Instructions      Home Living Family/patient expects to be discharged to:: Other (Comment) (I living. )                             Home Equipment: Gilford Rile - 2 wheels;Walker - 4 wheels;Shower seat - built in;Grab bars - toilet;Grab bars - tub/shower;Hand held shower head          Prior Functioning/Environment Level of Independence: Independent with assistive device(s)        Comments: would take dinner in dining room. she would prepare breakfest and lunch.         OT Problem List:        OT Treatment/Interventions:      OT Goals(Current goals can be found in the care plan section) Acute Rehab OT Goals Patient Stated Goal: go  home today  OT Frequency:     Barriers to D/C:            Co-evaluation              AM-PAC OT "6 Clicks" Daily Activity     Outcome Measure Help from another person eating meals?: None Help from another person taking care of personal grooming?: None Help from another person toileting, which includes using toliet, bedpan, or urinal?: None Help from another person bathing (including washing, rinsing, drying)?: None Help from another person to put on and taking off regular upper body clothing?: None Help from another person to put on and taking off regular lower body clothing?: None 6 Click Score: 24   End of Session Equipment Utilized During Treatment: Gait belt;Rolling walker Nurse Communication: Mobility status  Activity Tolerance: Patient tolerated treatment well Patient left: in bed;with call bell/phone within reach;Other (comment) (PT)  OT Visit Diagnosis: Muscle weakness (generalized) (M62.81)                Time: 0930-1005 OT Time Calculation (min): 35 min Charges:  OT General Charges $OT Visit: 1 Visit OT Evaluation $OT Eval Low Complexity: 1 Low OT Treatments $Self Care/Home Management : 8-22 mins  Reece Packer OT/L   Annsley Akkerman 09/22/2020, 10:18 AM

## 2020-09-22 NOTE — Progress Notes (Signed)
D/C instructions given and reviewed, questions answered and encouraged to call with further concerns. Tele and IV removed. Tolerated well.

## 2020-09-22 NOTE — Evaluation (Signed)
Physical Therapy Evaluation Patient Details Name: AARYA ROBINSON MRN: [id number] DOB: 26-Jun-1929 Today's Date: 09/22/2020   History of Present Illness  HIDAYA DANIEL is a 86 y.o. female with medical history significant of coronary artery disease status post stenting in 1992.  Also another stent 2005 to RCA, moderate aortic stenosis, hypertension, hyperlipidemia, chronic pain and GERD, scoliosis, depression with anxiety, osteoporosis, GERD who came in today with chest pain and new A. fib with RVR.  EMS apparently responded to her call.  Noted that patient was in A. fib with RVR and cardioverted her.  She has since been having anterior chest wall pain.  She was seen in the ER and cardiology consulted.  Patient was evaluated and at this point based on her recent visit with Dr. Martinique 3 days ago patient does not appear to be having you cardiac event.  She had an echo 3 years ago with EF of 60 to 65%.  No abnormal changes to the EKG.  Patient notes anticoagulated.  She is currently back in sinus rhythm and staying in sinus rhythm.  We will be admitting patient due to a resolved anemia.  Most likely symptomatic anemia leading to her tachycardia and then A. fib with RVR also..  Clinical Impression  Pt is at or close to baseline functioning and should be safe at home with PRN help from her sister. There are no further acute PT needs.  Will sign off at this time.     Follow Up Recommendations No PT follow up    Equipment Recommendations  None recommended by PT    Recommendations for Other Services       Precautions / Restrictions Precautions Precautions: Fall Restrictions Weight Bearing Restrictions: No      Mobility  Bed Mobility Overal bed mobility: Modified Independent                Transfers Overall transfer level: Modified independent Equipment used: Rolling walker (2 wheeled)                Ambulation/Gait Ambulation/Gait assistance: Supervision;Modified independent  (Device/Increase time) Gait Distance (Feet): 400 Feet Assistive device: Rolling walker (2 wheeled) Gait Pattern/deviations: Step-through pattern Gait velocity: moderate Gait velocity interpretation: 1.31 - 2.62 ft/sec, indicative of limited community ambulator General Gait Details: generally steady with some RW waggle.  Sats 91-95%, HR 68 bpm   no overt dyspnea  Stairs            Wheelchair Mobility    Modified Rankin (Stroke Patients Only)       Balance Overall balance assessment: Modified Independent;Needs assistance Sitting-balance support: Feet supported Sitting balance-Leahy Scale: Good       Standing balance-Leahy Scale: Fair                               Pertinent Vitals/Pain Pain Assessment: No/denies pain    Home Living Family/patient expects to be discharged to:: Other (Comment) (I living. )               Home Equipment: Gilford Rile - 2 wheels;Walker - 4 wheels;Shower seat - built in;Grab bars - toilet;Grab bars - tub/shower;Hand held shower head      Prior Function Level of Independence: Independent with assistive device(s)         Comments: would take dinner in dining room. she would prepare breakfest and lunch.      Hand Dominance   Dominant Hand: Right  Extremity/Trunk Assessment   Upper Extremity Assessment Upper Extremity Assessment: Generalized weakness (L weaker than R)    Lower Extremity Assessment Lower Extremity Assessment: Overall WFL for tasks assessed (mild weakness bil large proximal muscle groups)       Communication   Communication: No difficulties  Cognition Arousal/Alertness: Awake/alert Behavior During Therapy: WFL for tasks assessed/performed Overall Cognitive Status: Within Functional Limits for tasks assessed                                        General Comments General comments (skin integrity, edema, etc.): vss overall    Exercises     Assessment/Plan    PT Assessment  Patent does not need any further PT services  PT Problem List         PT Treatment Interventions      PT Goals (Current goals can be found in the Care Plan section)  Acute Rehab PT Goals Patient Stated Goal: go home today PT Goal Formulation: All assessment and education complete, DC therapy    Frequency     Barriers to discharge        Co-evaluation               AM-PAC PT "6 Clicks" Mobility  Outcome Measure Help needed turning from your back to your side while in a flat bed without using bedrails?: None Help needed moving from lying on your back to sitting on the side of a flat bed without using bedrails?: None Help needed moving to and from a bed to a chair (including a wheelchair)?: None Help needed standing up from a chair using your arms (e.g., wheelchair or bedside chair)?: None Help needed to walk in hospital room?: None Help needed climbing 3-5 steps with a railing? : None 6 Click Score: 24    End of Session   Activity Tolerance: Patient tolerated treatment well Patient left: in bed;Other (comment) (sitting EOB) Nurse Communication: Mobility status PT Visit Diagnosis: Difficulty in walking, not elsewhere classified (R26.2)    Time: 1004-1030 PT Time Calculation (min) (ACUTE ONLY): 26 min   Charges:   PT Evaluation $PT Eval Moderate Complexity: 1 Mod PT Treatments $Gait Training: 8-22 mins        09/22/2020  Ginger Carne., PT Acute Rehabilitation Services [phone]  (pager) [phone]  (office)  Tessie Fass Elah Avellino 09/22/2020, 10:53 AM

## 2020-09-22 NOTE — Discharge Summary (Signed)
Physician Discharge Summary  [name]:[id number] DOB: 01-17-1929 DOA: 09/20/2020  PCP: [name], MD  Admit date: 09/20/2020 Discharge date: 09/22/2020  Admitted From: Independent living Disposition: Independent living  Recommendations for Outpatient Follow-up:  1. Follow up with PCP in 1-2 weeks 2. Please obtain BMP/CBC in one week your next doctors visit.  3. Norvasc increased to 10 mg daily 4. Iron supplements and bowel regimen added 5. Outpatient cardiac monitoring to be arranged by cardiology team  Home Health: Declined OT Equipment/Devices: Continue using walker Discharge Condition: Stable CODE STATUS: Full code Diet recommendation: Heart healthy  Brief/Interim Summary: 86 year old with history of CAD status post stent, moderate aortic stenosis, HTN, HLD, GERD, scoliosis, depression, osteoporosis, chronic pain admitted for chest pain and new onset atrial fibrillation with RVR.  When EMS arrived on scene at her independent living her heart rate was greater than 190 which required cardioversion and restoration of normal sinus rhythm.  In the ER she had some EKG changes, mildly elevated troponin, chest x-ray with some left basilar opacity.   Assessment & Plan:   Principal Problem:   Symptomatic anemia Active Problems:   GERD   CAD (coronary artery disease)   HTN (hypertension)   Hypercholesteremia   Anxiety and depression   Aortic stenosis, mild   Atrial fibrillation with RVR (HCC)  Atrial fibrillation with RVR Chest pain likely from demand ischemia -Heart rate is improved. -Cardiology team following.  Closely monitor electrolytes and replete as necessary -Repeat echocardiogram showed EF of 62%, grade 1 diastolic dysfunction. -Toprol 50 mg daily. -Depending on how her hemoglobin remains outpatient, she would benefit from anticoagulation especially if she has 24/7 supervision.  Microcytic anemia, severe iron deficiency -Baseline hemoglobin around 10.0.   Admission hemoglobin 7.7.  Posttransfusion hemoglobin 9.5 -Status post units PRBC transfusion in the hospital, posttransfusion developed some itching therefore Benadryl was given.  Hemoglobin now remained stable.  Iron studies showed severe iron deficiency anemia therefore received 1 dose of IV iron followed by p.o. supplements and bowel regimen -Patient not interested in inpatient endoscopic evaluation as she does not have any gross signs of bright red blood per rectum or melanotic stools.  This can be closely observed as outpatient. -Repeat lab work outpatient-CBC when follows up with PCP -Colonoscopy 2010-multiple polyps and sigmoid diverticulosis  Bacteriuria, asymptomatic -Asymptomatic, urine cultures pending.  History of coronary artery disease status post PCI Moderate aortic stenosis -Resume her home beta-blocker, statin and aspirin. -Repeat echocardiogram shows EF of 26%, grade 1 diastolic dysfunction  Essential hypertension, uncontrolled -Resume her home regimen of losartan, Toprol, HCTZ.  Norvasc increased to 10 mg daily  Hyperlipidemia -40 mg Lipitor  Anxiety with depression -Continue home meds   Body mass index is 23.26 kg/m.         Discharge Diagnoses:  Principal Problem:   Symptomatic anemia Active Problems:   GERD   CAD (coronary artery disease)   HTN (hypertension)   Hypercholesteremia   Anxiety and depression   Aortic stenosis, mild   Atrial fibrillation with RVR [facility])      Consultations:  Cardiology  Subjective: Feels great wishes to go back to independent living today.  Denies any bright red blood per rectum or melanotic stools.  Does not wish to get any endoscopic evaluation in the hospital and would like to defer this as outpatient.  Discharge Exam: Vitals:   09/22/20 0814 09/22/20 1141  BP:  (!) 164/83  Pulse: (!) 57 78  Resp:  18  Temp:  97.7 F (36.5 C)  SpO2:  90%   Vitals:   09/22/20 0656 09/22/20 0740 09/22/20 0814  09/22/20 1141  BP: (!) 175/74 (!) 188/80  (!) 164/83  Pulse: (!) 52 (!) 52 (!) 57 78  Resp:  18  18  Temp:  97.6 F (36.4 C)  97.7 F (36.5 C)  TempSrc:  Oral  Oral  SpO2:  94%  90%  Weight:      Height:        General: Pt is alert, awake, not in acute distress, elderly frail Cardiovascular: RRR, S1/S2 +, no rubs, no gallops Respiratory: CTA bilaterally, no wheezing, no rhonchi Abdominal: Soft, NT, ND, bowel sounds + Extremities: no edema, no cyanosis  Discharge Instructions  Discharge Instructions    Amb referral to [facility] Clinic   Complete by: As directed    Face-to-face encounter (required for Medicare/Medicaid patients)   Complete by: As directed    I Darien Mignogna Chirag Gwyndolyn Guilford certify that this patient is under my care and that I, or a nurse practitioner or physician's assistant working with me, had a face-to-face encounter that meets the physician face-to-face encounter requirements with this patient on 09/22/2020. The encounter with the patient was in whole, or in part for the following medical condition(s) which is the primary reason for home health care generalized weakness.   The encounter with the patient was in whole, or in part, for the following medical condition, which is the primary reason for home health care: generalized waekness.   I certify that, based on my findings, the following services are medically necessary home health services: Physical therapy   Reason for Medically Necessary Home Health Services: Therapy- Therapeutic Exercises to Increase Strength and Endurance   My clinical findings support the need for the above services: Unsafe ambulation due to balance issues   Further, I certify that my clinical findings support that this patient is homebound due to: Unable to leave home safely without assistance   Home Health   Complete by: As directed    To provide the following care/treatments: PT     Allergies as of 09/22/2020      Reactions   Nifedipine Other (See  Comments)   Dark Urine; can only take orange tablet, allergic to yellow-brown tablet   Tetanus Toxoids    Adhesive [tape] Rash      Medication List    TAKE these medications   amLODipine 10 MG tablet Commonly known as: NORVASC Take 1 tablet (10 mg total) by mouth daily. Start taking on: September 23, 2020 What changed:   medication strength  how much to take   aspirin 81 MG tablet Take 2 tablets (162 mg total) by mouth daily.   atorvastatin 40 MG tablet Commonly known as: LIPITOR TAKE 1 TABLET DAILY   cholecalciferol 1000 units tablet Commonly known as: VITAMIN D Take 1,000 Units by mouth 2 (two) times daily.   ferrous sulfate 325 (65 FE) MG tablet Take 1 tablet (325 mg total) by mouth daily.   FIBER FORMULA PO Take 1 tablet by mouth daily.   hydrochlorothiazide 25 MG tablet Commonly known as: HYDRODIURIL Take 1 tablet (25 mg total) by mouth daily.   losartan 100 MG tablet Commonly known as: COZAAR Take 1 tablet (100 mg total) by mouth daily.   Melatonin 1 MG Caps Take 1 capsule by mouth every evening.   metoprolol succinate 50 MG 24 hr tablet Commonly known as: TOPROL-XL TAKE 1 TABLET BY MOUTH EVERY DAY WITH OR  RIGHT AFTER MEAL   nitroGLYCERIN 0.4 MG SL tablet Commonly known as: Nitrostat Place 1 tablet (0.4 mg total) under the tongue every 5 (five) minutes as needed for chest pain.   senna-docusate 8.6-50 MG tablet Commonly known as: Senokot-S Take 1 tablet by mouth at bedtime as needed for moderate constipation.       Follow-up Information    Darreld Mclean, PA-C Follow up.   Specialties: Physician Assistant, Cardiology Why: Hospital follow-up scheduled for 09/29/2020 at 3pm. Please arrive 15 minutes early for check-in. Our office will also be calling you to help set up heart monitor. Contact information: [address] [place] [address] [phone]        [name], MD. Schedule an appointment as soon as possible for  a visit in 1 week(s).   Specialty: Internal Medicine Contact information: [place] [address] [phone]        Martinique, Peter M, MD .   Specialty: Cardiology Contact information: [address] [place] [address] [phone]              Allergies  Allergen Reactions  . Nifedipine Other (See Comments)    Dark Urine; can only take orange tablet, allergic to yellow-brown tablet  . Tetanus Toxoids   . Adhesive [Tape] Rash    You were cared for by a hospitalist during your hospital stay. If you have any questions about your discharge medications or the care you received while you were in the hospital after you are discharged, you can call the unit and asked to speak with the hospitalist on call if the hospitalist that took care of you is not available. Once you are discharged, your primary care physician will handle any further medical issues. Please note that no refills for any discharge medications will be authorized once you are discharged, as it is imperative that you return to your primary care physician (or establish a relationship with a primary care physician if you do not have one) for your aftercare needs so that they can reassess your need for medications and monitor your lab values.   Procedures/Studies: DG Chest Port 1 View  Result Date: 09/20/2020 CLINICAL DATA:  Atrial fibrillation, shortness of breath EXAM: PORTABLE CHEST 1 VIEW COMPARISON:  07/25/2005 FINDINGS: Postsurgical changes to the sternum and mediastinum. Cardiomegaly, increased from remote prior. Atherosclerotic calcification of the aortic knob. Streaky left basilar opacity. Right lung is clear. No pleural effusion or pneumothorax. Degenerative changes of the bilateral shoulders and thoracic spine. IMPRESSION: 1. Streaky left basilar opacity which may represent atelectasis versus infiltrate. 2. Cardiomegaly, increased from remote prior. Electronically Signed   By: Davina Poke D.O.   On: 09/20/2020 15:34   ECHOCARDIOGRAM COMPLETE  Result Date: 09/21/2020    ECHOCARDIOGRAM REPORT   Patient Name:   [name] Date of Exam: 09/21/2020 Medical Rec #:  [id number]     Height:       60.0 in Accession #:    [id number]    Weight:       121.0 lb Date of Birth:  1929/09/06    BSA:          1.508 m Patient Age:    86 years      BP:           187/72 mmHg Patient Gender: F             HR:  59 bpm. Exam Location:  Inpatient Procedure: 2D Echo, Cardiac Doppler and Color Doppler Indications:    Atrial fibrillation  History:        Patient has prior history of Echocardiogram examinations, most                 recent 09/30/2018. Previous Myocardial Infarction and CAD, Aortic                 Valve Disease; Risk Factors:Hypertension, Dyslipidemia and GERD.  Sonographer:    Tiffany Dance Referring Phys: [id number] Darreld Mclean  Sonographer Comments: No subcostal window. IMPRESSIONS  1. Left ventricular ejection fraction, by estimation, is 65 to 70%. The left ventricle has normal function. The left ventricle has no regional wall motion abnormalities. There is mild left ventricular hypertrophy. Left ventricular diastolic parameters are consistent with Grade I diastolic dysfunction (impaired relaxation). Elevated left ventricular end-diastolic pressure. The E/e' is 20.  2. Right ventricular systolic function is normal. The right ventricular size is normal. There is mildly elevated pulmonary artery systolic pressure. The estimated right ventricular systolic pressure is 62.1 mmHg.  3. Left atrial size was severely dilated.  4. Right atrial size was mildly dilated.  5. The mitral valve is abnormal. Mild mitral valve regurgitation.  6. The aortic valve is tricuspid. Aortic valve regurgitation is not visualized. Moderate aortic valve stenosis. Aortic valve area, by VTI measures 1.07 cm. Aortic valve mean gradient measures 20.0 mmHg. Aortic valve Vmax measures 3.02 m/s. Peak gradient 37 mmHg.  DI 0.34  7. The inferior vena cava is normal in size with <50% respiratory variability, suggesting right atrial pressure of 8 mmHg. Comparison(s): Prior images unable to be directly viewed, comparison made by report only. Changes from prior study are noted. 09/30/2018: LVEF 60-65%, mild to moderate AS -mean/peak gradient 13/26 mmHg. FINDINGS  Left Ventricle: Left ventricular ejection fraction, by estimation, is 65 to 70%. The left ventricle has normal function. The left ventricle has no regional wall motion abnormalities. The left ventricular internal cavity size was normal in size. There is  mild left ventricular hypertrophy. Left ventricular diastolic parameters are consistent with Grade I diastolic dysfunction (impaired relaxation). Elevated left ventricular end-diastolic pressure. The E/e' is 15. Right Ventricle: The right ventricular size is normal. No increase in right ventricular wall thickness. Right ventricular systolic function is normal. There is mildly elevated pulmonary artery systolic pressure. The tricuspid regurgitant velocity is 3.03  m/s, and with an assumed right atrial pressure of 8 mmHg, the estimated right ventricular systolic pressure is 30.8 mmHg. Left Atrium: Left atrial size was severely dilated. Right Atrium: Right atrial size was mildly dilated. Pericardium: There is no evidence of pericardial effusion. Mitral Valve: The mitral valve is abnormal. There is mild thickening of the mitral valve leaflet(s). Mild mitral annular calcification. Mild mitral valve regurgitation. Tricuspid Valve: The tricuspid valve is grossly normal. Tricuspid valve regurgitation is mild. Aortic Valve: The aortic valve is tricuspid. Aortic valve regurgitation is not visualized. Moderate aortic stenosis is present. Aortic valve mean gradient measures 20.0 mmHg. Aortic valve peak gradient measures 36.5 mmHg. Aortic valve area, by VTI measures 1.07 cm. Pulmonic Valve: The pulmonic valve was normal in structure.  Pulmonic valve regurgitation is not visualized. Aorta: The aortic root and ascending aorta are structurally normal, with no evidence of dilitation. Venous: The inferior vena cava is normal in size with less than 50% respiratory variability, suggesting right atrial pressure of 8 mmHg. IAS/Shunts: No atrial level shunt detected by color flow  Doppler.  LEFT VENTRICLE PLAX 2D LVIDd:         3.60 cm  Diastology LVIDs:         2.70 cm  LV e' medial:    6.74 cm/s LV PW:         1.00 cm  LV E/e' medial:  15.6 LV IVS:        1.20 cm  LV e' lateral:   6.96 cm/s LVOT diam:     2.00 cm  LV E/e' lateral: 15.1 LV SV:         83 LV SV Index:   55 LVOT Area:     3.14 cm  RIGHT VENTRICLE RV Basal diam:  2.60 cm RV S prime:     11.70 cm/s TAPSE (M-mode): 2.2 cm LEFT ATRIUM             Index       RIGHT ATRIUM           Index LA diam:        3.30 cm 2.19 cm/m  RA Area:     19.20 cm LA Vol (A2C):   85.3 ml 56.58 ml/m RA Volume:   51.30 ml  34.03 ml/m LA Vol (A4C):   56.6 ml 37.54 ml/m LA Biplane Vol: 75.4 ml 50.01 ml/m  AORTIC VALVE AV Area (Vmax):    1.09 cm AV Area (Vmean):   1.04 cm AV Area (VTI):     1.07 cm AV Vmax:           302.00 cm/s AV Vmean:          212.000 cm/s AV VTI:            0.780 m AV Peak Grad:      36.5 mmHg AV Mean Grad:      20.0 mmHg LVOT Vmax:         105.00 cm/s LVOT Vmean:        70.000 cm/s LVOT VTI:          0.265 m LVOT/AV VTI ratio: 0.34  AORTA Ao Root diam: 3.40 cm Ao Asc diam:  2.70 cm MITRAL VALVE                TRICUSPID VALVE MV Area (PHT): 3.37 cm     TR Peak grad:   36.7 mmHg MV Decel Time: 225 msec     TR Vmax:        303.00 cm/s MV E velocity: 105.00 cm/s MV A velocity: 98.50 cm/s   SHUNTS MV E/A ratio:  1.07         Systemic VTI:  0.26 m                             Systemic Diam: 2.00 cm Lyman Bishop MD Electronically signed by Lyman Bishop MD Signature Date/Time: 09/21/2020/12:40:45 PM    Final       The results of significant diagnostics from this hospitalization (including  imaging, microbiology, ancillary and laboratory) are listed below for reference.     Microbiology: Recent Results (from the past 240 hour(s))  Respiratory Panel by RT PCR (Flu A&B, Covid) - Nasopharyngeal Swab     Status: None   Collection Time: 09/20/20  7:55 PM   Specimen: Nasopharyngeal Swab  Result Value Ref Range Status   SARS Coronavirus 2 by RT PCR NEGATIVE NEGATIVE Final    Comment: (NOTE) SARS-CoV-2 target nucleic acids are NOT  DETECTED.  The SARS-CoV-2 RNA is generally detectable in upper respiratoy specimens during the acute phase of infection. The lowest concentration of SARS-CoV-2 viral copies this assay can detect is 131 copies/mL. A negative result does not preclude SARS-Cov-2 infection and should not be used as the sole basis for treatment or other patient management decisions. A negative result may occur with  improper specimen collection/handling, submission of specimen other than nasopharyngeal swab, presence of viral mutation(s) within the areas targeted by this assay, and inadequate number of viral copies (<131 copies/mL). A negative result must be combined with clinical observations, patient history, and epidemiological information. The expected result is Negative.  Fact Sheet for Patients:  PinkCheek.be  Fact Sheet for Healthcare Providers:  GravelBags.it  This test is no t yet approved or cleared by the [place] FDA and  has been authorized for detection and/or diagnosis of SARS-CoV-2 by FDA under an Emergency Use Authorization (EUA). This EUA will remain  in effect (meaning this test can be used) for the duration of the COVID-19 declaration under Section 564(b)(1) of the Act, 21 U.S.C. section 360bbb-3(b)(1), unless the authorization is terminated or revoked sooner.     Influenza A by PCR NEGATIVE NEGATIVE Final   Influenza B by PCR NEGATIVE NEGATIVE Final    Comment: (NOTE) The Xpert  Xpress SARS-CoV-2/FLU/RSV assay is intended as an aid in  the diagnosis of influenza from Nasopharyngeal swab specimens and  should not be used as a sole basis for treatment. Nasal washings and  aspirates are unacceptable for Xpert Xpress SARS-CoV-2/FLU/RSV  testing.  Fact Sheet for Patients: PinkCheek.be  Fact Sheet for Healthcare Providers: GravelBags.it  This test is not yet approved or cleared by the [place] FDA and  has been authorized for detection and/or diagnosis of SARS-CoV-2 by  FDA under an Emergency Use Authorization (EUA). This EUA will remain  in effect (meaning this test can be used) for the duration of the  Covid-19 declaration under Section 564(b)(1) of the Act, 21  U.S.C. section 360bbb-3(b)(1), unless the authorization is  terminated or revoked. Performed at [facility], [place] [address], [place], [place] [address]   Culture, Urine     Status: Abnormal (Preliminary result)   Collection Time: 09/21/20  8:46 AM   Specimen: Urine, Catheterized  Result Value Ref Range Status   Specimen Description URINE, CATHETERIZED  Final   Special Requests NONE  Final   Culture (A)  Final    >=100,000 COLONIES/mL STAPHYLOCOCCUS EPIDERMIDIS SUSCEPTIBILITIES TO FOLLOW Performed at [facility], [place] [address], [place], [place] [address]    Report Status PENDING  Incomplete     Labs: BNP (last 3 results) No results for input(s): BNP in the last 8760 hours. Basic Metabolic Panel: Recent Labs  Lab 09/17/20 1559 09/20/20 1452 09/21/20 0529 09/22/20 1115  NA 139 140 139 139  K 4.2 3.7 4.0 3.5  CL 104 109 108 107  CO2 24 21* 22 25  GLUCOSE 85 118* 86 89  BUN 14 16 11 8   CREATININE 0.74 0.78 0.70 0.60  CALCIUM 9.0 8.1* 8.7* 8.7*  MG  --  2.0  --  2.0   Liver Function Tests: Recent Labs  Lab 09/17/20 1559  AST 22  ALT 16  ALKPHOS 96  BILITOT 0.2  PROT 6.7  ALBUMIN 3.9   No results for  input(s): LIPASE, AMYLASE in the last 168 hours. No results for input(s): AMMONIA in the last 168 hours. CBC: Recent Labs  Lab 09/17/20 1559 09/20/20 1452 09/21/20 0529 09/22/20 1115  WBC 5.1 4.6 5.0 4.4  NEUTROABS 3.4  --   --   --   HGB 9.1* 7.7* 9.5* 10.2*  HCT 30.5* 27.5* 32.3* 33.9*  MCV 72* 74.9* 76.9* 76.7*  PLT 262 213 228 221   Cardiac Enzymes: No results for input(s): CKTOTAL, CKMB, CKMBINDEX, TROPONINI in the last 168 hours. BNP: Invalid input(s): POCBNP CBG: No results for input(s): GLUCAP in the last 168 hours. D-Dimer No results for input(s): DDIMER in the last 72 hours. Hgb A1c No results for input(s): HGBA1C in the last 72 hours. Lipid Profile No results for input(s): CHOL, HDL, LDLCALC, TRIG, CHOLHDL, LDLDIRECT in the last 72 hours. Thyroid function studies Recent Labs    09/21/20 1002  TSH 1.442   Anemia work up Recent Labs    09/20/20 1806 09/21/20 1002  VITAMINB12  --  312  FERRITIN 7*  --   TIBC 420  --   IRON 13*  --    Urinalysis    Component Value Date/Time   COLORURINE YELLOW 09/21/2020 0654   APPEARANCEUR HAZY (A) 09/21/2020 0654   LABSPEC 1.008 09/21/2020 0654   PHURINE 7.0 09/21/2020 0654   GLUCOSEU NEGATIVE 09/21/2020 0654   HGBUR SMALL (A) 09/21/2020 0654   BILIRUBINUR NEGATIVE 09/21/2020 0654   KETONESUR NEGATIVE 09/21/2020 0654   PROTEINUR NEGATIVE 09/21/2020 0654   NITRITE POSITIVE (A) 09/21/2020 0654   LEUKOCYTESUR LARGE (A) 09/21/2020 0654   Sepsis Labs Invalid input(s): PROCALCITONIN,  WBC,  LACTICIDVEN Microbiology Recent Results (from the past 240 hour(s))  Respiratory Panel by RT PCR (Flu A&B, Covid) - Nasopharyngeal Swab     Status: None   Collection Time: 09/20/20  7:55 PM   Specimen: Nasopharyngeal Swab  Result Value Ref Range Status   SARS Coronavirus 2 by RT PCR NEGATIVE NEGATIVE Final    Comment: (NOTE) SARS-CoV-2 target nucleic acids are NOT DETECTED.  The SARS-CoV-2 RNA is generally detectable in  upper respiratoy specimens during the acute phase of infection. The lowest concentration of SARS-CoV-2 viral copies this assay can detect is 131 copies/mL. A negative result does not preclude SARS-Cov-2 infection and should not be used as the sole basis for treatment or other patient management decisions. A negative result may occur with  improper specimen collection/handling, submission of specimen other than nasopharyngeal swab, presence of viral mutation(s) within the areas targeted by this assay, and inadequate number of viral copies (<131 copies/mL). A negative result must be combined with clinical observations, patient history, and epidemiological information. The expected result is Negative.  Fact Sheet for Patients:  PinkCheek.be  Fact Sheet for Healthcare Providers:  GravelBags.it  This test is no t yet approved or cleared by the [place] FDA and  has been authorized for detection and/or diagnosis of SARS-CoV-2 by FDA under an Emergency Use Authorization (EUA). This EUA will remain  in effect (meaning this test can be used) for the duration of the COVID-19 declaration under Section 564(b)(1) of the Act, 21 U.S.C. section 360bbb-3(b)(1), unless the authorization is terminated or revoked sooner.     Influenza A by PCR NEGATIVE NEGATIVE Final   Influenza B by PCR NEGATIVE NEGATIVE Final    Comment: (NOTE) The Xpert Xpress SARS-CoV-2/FLU/RSV assay is intended as an aid in  the diagnosis of influenza from Nasopharyngeal swab specimens and  should not be used as a sole basis for treatment. Nasal washings and  aspirates are unacceptable for Xpert Xpress SARS-CoV-2/FLU/RSV  testing.  Fact Sheet for Patients: PinkCheek.be  Fact Sheet for Healthcare Providers: GravelBags.it  This test is not yet approved or cleared by the [place] FDA and  has been  authorized for detection and/or diagnosis of SARS-CoV-2 by  FDA under an Emergency Use Authorization (EUA). This EUA will remain  in effect (meaning this test can be used) for the duration of the  Covid-19 declaration under Section 564(b)(1) of the Act, 21  U.S.C. section 360bbb-3(b)(1), unless the authorization is  terminated or revoked. Performed at [facility], [place] [address] [place], [place] [address]   Culture, Urine     Status: Abnormal (Preliminary result)   Collection Time: 09/21/20  8:46 AM   Specimen: Urine, Catheterized  Result Value Ref Range Status   Specimen Description URINE, CATHETERIZED  Final   Special Requests NONE  Final   Culture (A)  Final    >=100,000 COLONIES/mL STAPHYLOCOCCUS EPIDERMIDIS SUSCEPTIBILITIES TO FOLLOW Performed at [facility], [place] [address], [place], [place] [address]    Report Status PENDING  Incomplete     Time coordinating discharge:  I have spent 35 minutes face to face with the patient and on the ward discussing the patients care, assessment, plan and disposition with other care givers. >50% of the time was devoted counseling the patient about the risks and benefits of treatment/Discharge disposition and coordinating care.   SIGNED:   Damita Lack, MD  [facility] Hospitalists 09/22/2020, 12:39 PM   If 7PM-7AM, please contact night-coverage

## 2020-09-23 ENCOUNTER — Telehealth: Payer: Self-pay | Admitting: Cardiology

## 2020-09-23 LAB — URINE CULTURE: Culture: >=100000 — AB

## 2020-09-23 MED ORDER — NITROGLYCERIN 0.4 MG SL SUBL: 0.4000 mg | SUBLINGUAL_TABLET | SUBLINGUAL | 11 refills | Status: DC | PRN

## 2020-09-23 NOTE — Telephone Encounter (Signed)
Patient's sister calling to speak with Malachy Mood. She states she has some questions about her medications.

## 2020-09-23 NOTE — Telephone Encounter (Signed)
Returned call to patient's sister Mardene Celeste phone rings busy.

## 2020-09-23 NOTE — Telephone Encounter (Signed)
Will forward this message to Dr. Doug Sou primary covering nurse, to further follow-up with the pt, as requested.

## 2020-09-23 NOTE — Telephone Encounter (Signed)
Spoke to patient's sister Mardene Celeste she stated patient needs NTG refilled.Advised refill sent to pharmacy.

## 2020-09-24 ENCOUNTER — Telehealth: Payer: Self-pay | Admitting: Cardiology

## 2020-09-24 NOTE — Telephone Encounter (Signed)
Patient's sister states they gave the patient a list of medications at the hospital and she would like to verify the instructions with Malachy Mood.

## 2020-09-24 NOTE — Telephone Encounter (Signed)
Called, spoke to sister Mardene Celeste. She handles her sisters medications.  She just wanted to make sure no other changes were made other than increase to Amlodipine, and to see about referral to AFIB clinic-  I advised of all questions sister had, and upcoming appointment with our office next week- and other questions could be answered at this visit.  Sister verbalized understanding, thankful for call back.

## 2020-09-25 NOTE — Progress Notes (Signed)
Cardiology Office Note:    Date:  09/29/2020   ID:  [name], DOB 1929/12/06, MRN [id number]  PCP:  [name], MD  Cardiologist:  [name], MD  Electrophysiologist:  None   Referring MD: [name], MD   Chief Complaint: hospital follow-up for atrial fibrillation   History of Present Illness:    [name] is a 86 y.o. female with a history of CAD s/p prior stenting to D2 in 1992 and RCA in 2005, mild to moderate aortic stenosis, hypertension, hyperlipidemia, GERD, chronic back pain related to scoliosis, anxiety, and depression who is followed by Dr. Martinique and presents today for hospital follow-up of atrial fibrillation.  Patient was last seen by Dr. Martinique on 09/17/2020 at which time she reported she was not sleeping well and was under a lot of stress but was doing well from a cardiac standpoint. Following this visit, she was admitted from 09/20/2020 to 09/22/2020 for atrial fibrillation after presenting with chest pain and shortness of breath but no palpitations. Rates were as high as the 190's. She was cardioverted in the field by EMS with restoration of sinus rhythm. Chest pain resolved with restoration of sinus rhythm. In the ED, she was found to have acute anemia with hemoglobin of 7.7 (down from 9.1 on 09/17/2020 and 13.8 in 09/2019). High-sensitivity troponin peaked at 603 but this was felt to due to demand ischemia from atrial fibrillation with RVR and anemia.  Echo showed LVEF of 65-70% with normal wall motion, grade 1 diastolic dysfunction, severely dilated left atrium, moderate aortic stenosis, mild mitral regurgitation. In regards to anemia work-up, hemoccult was negative but iron and ferritin were low consistent with iron deficiency anemia. She was transfused but did develop some itching post-transfusion. She also received one dose of IV iron and then started on PO regimen. Patient was not interested in inpatient endoscopic evaluation given no hematochezia or melena.  Hemoglobin improved to 10.2 on day of discharge. CHA2DS2-VASc = 5 (CAD, HTN, age x2, female). However, given acute anemia, anticoagulation was not started. There was also concern that she may be at increased risk of anticoagulation due to confusion demonstrated during admission and increased risk of falls. Last Cardiology note did note that if she could be watched 24/7 with a caregiver then anticoagulation may be safe. However, this was before decision not to perform EGD/colonscopy was made.  Patient presents today for follow-up. Her with sister Mardene Celeste. She has done well since discharge. No recurrent symptoms - no chest pain, shortness of breath, palpitations. No lightheadedness, dizziness, near syncope, orthopnea, PND, or edema. She has not noticed any abnormal bleeding in urine or stools. She saw her PCP yesterday and had repeat labs - we will request these. It does not sound like any additional GI work-up for further evaluation of anemia is planned at this time.  Of note, patient lives at [facility] in [place].   Past Medical History:  Diagnosis Date  . Anxiety and depression   . Aortic stenosis    a. 03/2014: Mild aortic stenosis by valve area and mean gradient though appeared more visually consistent with moderate AS.   [name] Kitchen CAD (coronary artery disease)    a. s/p prior PCI of D2 in 1992 b. stent to RCA in 2005  . Diverticula of colon   . GERD (gastroesophageal reflux disease)   . HTN (hypertension)   . Hypercholesteremia   . Lateral myocardial infarction ([place]) 1992  . Osteoarthritis   . Osteopenia   .  Scoliosis     Past Surgical History:  Procedure Laterality Date  . ABDOMINAL HYSTERECTOMY    . APPENDECTOMY    . CORONARY ANGIOPLASTY    . TONSILLECTOMY      Current Medications: Current Meds  Medication Sig  . amLODipine (NORVASC) 10 MG tablet Take 1 tablet (10 mg total) by mouth daily.  [name] Kitchen atorvastatin (LIPITOR) 40 MG tablet TAKE 1 TABLET DAILY  . ferrous sulfate  325 (65 FE) MG tablet Take 1 tablet (325 mg total) by mouth daily.  . hydrochlorothiazide (HYDRODIURIL) 25 MG tablet Take 1 tablet (25 mg total) by mouth daily.  [name] Kitchen losartan (COZAAR) 100 MG tablet Take 1 tablet (100 mg total) by mouth daily.  . Melatonin 1 MG CAPS Take 1 capsule by mouth every evening.   . metoprolol succinate (TOPROL-XL) 50 MG 24 hr tablet TAKE 1 TABLET BY MOUTH EVERY DAY WITH OR RIGHT AFTER MEAL  . nitroGLYCERIN (NITROSTAT) 0.4 MG SL tablet Place 1 tablet (0.4 mg total) under the tongue every 5 (five) minutes as needed for chest pain.  . [DISCONTINUED] aspirin 81 MG tablet Take 2 tablets (162 mg total) by mouth daily.     Allergies:   Nifedipine, Tetanus toxoids, and Adhesive [tape]   Social History   Socioeconomic History  . Marital status: Divorced    Spouse name: Not on file  . Number of children: 1  . Years of education: Not on file  . Highest education level: Not on file  Occupational History  . Occupation: retired  Tobacco Use  . Smoking status: Former Smoker    Types: Cigarettes    Quit date: 12/26/1991    Years since quitting: 28.7  . Smokeless tobacco: Never Used  Vaping Use  . Vaping Use: Never used  Substance and Sexual Activity  . Alcohol use: No    Alcohol/week: 0.0 standard drinks  . Drug use: Yes    Types: Methylphenidate  . Sexual activity: Not on file  Other Topics Concern  . Not on file  Social History Narrative  . Not on file   Social Determinants of Health   Financial Resource Strain:   . Difficulty of Paying Living Expenses: Not on file  Food Insecurity:   . Worried About Charity fundraiser in the Last Year: Not on file  . Ran Out of Food in the Last Year: Not on file  Transportation Needs:   . Lack of Transportation (Medical): Not on file  . Lack of Transportation (Non-Medical): Not on file  Physical Activity:   . Days of Exercise per Week: Not on file  . Minutes of Exercise per Session: Not on file  Stress:   . Feeling of  Stress : Not on file  Social Connections:   . Frequency of Communication with Friends and Family: Not on file  . Frequency of Social Gatherings with Friends and Family: Not on file  . Attends Religious Services: Not on file  . Active Member of Clubs or Organizations: Not on file  . Attends Archivist Meetings: Not on file  . Marital Status: Not on file     Family History: The patient's family history includes Cancer in her brother; Heart failure in her mother; Hypertension in her father and mother.  ROS:   Please see the history of present illness.     EKGs/Labs/Other Studies Reviewed:    The following studies were reviewed today:  Echocardiogram 09/21/2020: Impressions: 1. Left ventricular ejection fraction, by estimation, is  65 to 70%. The  left ventricle has normal function. The left ventricle has no regional  wall motion abnormalities. There is mild left ventricular hypertrophy.  Left ventricular diastolic parameters  are consistent with Grade I diastolic dysfunction (impaired relaxation).  Elevated left ventricular end-diastolic pressure. The E/e' is 70.  2. Right ventricular systolic function is normal. The right ventricular  size is normal. There is mildly elevated pulmonary artery systolic  pressure. The estimated right ventricular systolic pressure is 25.0 mmHg.  3. Left atrial size was severely dilated.  4. Right atrial size was mildly dilated.  5. The mitral valve is abnormal. Mild mitral valve regurgitation.  6. The aortic valve is tricuspid. Aortic valve regurgitation is not  visualized. Moderate aortic valve stenosis. Aortic valve area, by VTI  measures 1.07 cm. Aortic valve mean gradient measures 20.0 mmHg. Aortic  valve Vmax measures 3.02 m/s. Peak  gradient 37 mmHg. DI 0.34  7. The inferior vena cava is normal in size with <50% respiratory  variability, suggesting right atrial pressure of 8 mmHg.   Comparison(s): Prior images unable to be  directly viewed, comparison made by report only. Changes from prior study are noted. 09/30/2018: LVEF 60-65%, mild to moderate AS -mean/peak gradient 13/26 mmHg.   EKG:  EKG ordered today. EKG personally reviewed and demonstrates sinus bradycardia, rate 51 bpm, with incomplete RBBB. Some underlying baseline wandering but T wave inversions in leads III and aVF (seen on prior tracings).   Recent Labs: 09/17/2020: ALT 16 09/21/2020: TSH 1.442 09/22/2020: BUN 8; Creatinine, Ser 0.60; Hemoglobin 10.2; Magnesium 2.0; Platelets 221; Potassium 3.5; Sodium 139  Recent Lipid Panel    Component Value Date/Time   CHOL 138 09/17/2020 1559   TRIG 70 09/17/2020 1559   HDL 73 09/17/2020 1559   CHOLHDL 1.9 09/17/2020 1559   CHOLHDL 2 08/22/2013 0946   VLDL 24.0 08/22/2013 0946   LDLCALC 51 09/17/2020 1559    Physical Exam:    Vital Signs: BP 128/60   Pulse (!) 51   Ht 5' (1.524 m)   Wt 119 lb 3.2 oz (54.1 kg)   SpO2 96%   BMI 23.28 kg/m     Wt Readings from Last 3 Encounters:  09/29/20 119 lb 3.2 oz (54.1 kg)  09/22/20 119 lb 1.6 oz (54 kg)  09/17/20 123 lb (55.8 kg)     General: 86 y.o. female in no acute distress. HEENT: Normocephalic and atraumatic. Sclera clear. EOMs intact. Neck: Supple. Right carotid bruit (possible radiation from murmur). No JVD. Heart: RRR. Distinct S1 and S2. III/VI systolic murmur. No gallops or rubs. Radial pulses 2+ and equal bilaterally. Lungs: No increased work of breathing. Clear to ausculation bilaterally. No wheezes, rhonchi, or rales.  Abdomen: Soft, non-distended, and non-tender to palpation.  Extremities: No significant lower extremity edema.    Skin: Warm and dry. Neuro: Alert and oriented x3. No focal deficits. Psych: Normal affect. Responds appropriately.  Assessment:    1. Paroxysmal atrial fibrillation (HCC)   2. Coronary artery disease involving native coronary artery of native heart without angina pectoris   3. Aortic valve stenosis, etiology  of cardiac valve disease unspecified   4. Bruit of right carotid artery   5. Primary hypertension   6. Hyperlipidemia, unspecified hyperlipidemia type   7. Iron deficiency anemia, unspecified iron deficiency anemia type     Plan:    Paroxysmal Atrial Fibrillation - Recently admitted with atrial fibrillation with RVR but converted back to normal sinus rhythm with DCCV. -  Maintaining sinus rhythm.  - Continue Toprol-XL 50mg  daily. - CHA2DS2-VASc = 5 (CAD, HTN, age x2, female). However, patient was not started on anticoagulation given acute anemia. There was also concern that she may be at increased risk of anticoagulation due to confusion demonstrated during admission and increased risk of falls. No GI work-up to look for source of bleed was performed during hospitalization and it does not sound like there is any plans for this as an outpatient. Patient does not feel like she needs this since she has not had any obvious bleeding. I am therefore hesitant to start her on anticoagulation today. She had repeat labs done at PCP's office yesterday - will request these labs and office visit note. Event Monitor was also ordered in the hospital to evaluate for recurrent atrial fibrillation. She has not been called about this yet so we will follow-up on this. For now, will hold off on starting anticoagulation and wait for results of repeat CBC and Event Monitor.   Of note, patient sister Mardene Celeste asked that we also call her with instructions about Event Monitor. Patient lives at [facility] but her sister has been helping her out.  CAD Demand Ischemia - History of prior stenting to D2 in 1992 and RCA in 2005. - Patient did have chest pain and elevated troponin during recent admission. However, chest pain resolved with restoration of sinus rhythm. Troponin elevation felt to be due to demand ischemia in setting of atrial fibrillation with RVR, DCCV, and acute anemia.  - No recurrent chest pain.  - Continue  aspirin, beta-blocker, and statin. Of note, patient had listed that she was taking 162mg  of Aspirin. Advised patient that she can reduce this to 81mg  daily.  Moderate Aortic Stenosis - Echo on 09/21/2020 during recent admission showed LVEF of 65-70% with normal wall motion and moderate AS. - Can continue to monitor as outpatient.  Right Carotid Bruit - Noted on exam. May be radiation from aortic stenosis murmur. - Offered carotid dopplers but patient would like to hold off for now. I think this is reasonable.   Hypertension - BP well controlled.  - Continue current medications: Amlodipine 10mg  daily, HCTZ 25mg  daily, Losartan 100mg  daily, and Toprol-XL 50mg  daily.  Hyperlipidemia - Continue Lipitor 40mg  daily.   Iron Deficiency Anemia - Patient presented with hemoglobin of 7.7 during recent admission. Treated with transfusion of PRBCs and IV iron.  Hemoglobin 10.2 on discharge. Of note, no GI evaluation was performed during admission. - Does not sound like there are plans for GI work-up as outpatient. - Continue iron supplement. - PCP reportedly checked CBC yesterday. Will request labs be faxed to [place].  Disposition: Follow up in 3 months with Dr. Martinique.   Medication Adjustments/Labs and Tests Ordered: Current medicines are reviewed at length with the patient today.  Concerns regarding medicines are outlined above.  Orders Placed This Encounter  Procedures  . EKG 12-Lead   Meds ordered this encounter  Medications  . aspirin EC 81 MG tablet    Sig: Take 1 tablet (81 mg total) by mouth daily. Swallow whole.    Dispense:  90 tablet    Refill:  3    Patient Instructions  Medication Instructions:  DECREASE your aspirin to 81 mg daily. *If you need a refill on your cardiac medications before your next appointment, please call your pharmacy*   Lab Work: We will contact your PCP for copies of your most recent lab work. If you have labs (blood work)  drawn today and your tests  are completely normal, you will receive your results only by: [name] Kitchen MyChart Message (if you have MyChart) OR . A paper copy in the mail If you have any lab test that is abnormal or we need to change your treatment, we will call you to review the results.   Testing/Procedures: We will follow up on the event monitor that was ordered for you.    Follow-Up: At [facility], you and your health needs are our priority.  As part of our continuing mission to provide you with exceptional heart care, we have created designated Provider Care Teams.  These Care Teams include your primary Cardiologist (physician) and Advanced Practice Providers (APPs -  Physician Assistants and Nurse Practitioners) who all work together to provide you with the care you need, when you need it.  We recommend signing up for the patient portal called "MyChart".  Sign up information is provided on this After Visit Summary.  MyChart is used to connect with patients for Virtual Visits (Telemedicine).  Patients are able to view lab/test results, encounter notes, upcoming appointments, etc.  Non-urgent messages can be sent to your provider as well.   To learn more about what you can do with MyChart, go to [email or web address].    Your next appointment:   3 month(s)  The format for your next appointment:   In Person  Provider:   [name], MD   Other Instructions Per your request, we will reach out to your sister to discuss instructions for your heart monitor.     Signed, Darreld Mclean, PA-C  09/29/2020 10:20 PM    [facility]

## 2020-09-27 ENCOUNTER — Other Ambulatory Visit: Payer: Self-pay | Admitting: *Deleted

## 2020-09-27 ENCOUNTER — Encounter: Payer: Self-pay | Admitting: *Deleted

## 2020-09-27 NOTE — Patient Outreach (Signed)
[name]) Care Management  09/27/2020  [name] 1929-08-16 [id number]   EMMI-general discharge RED ON EMMI ALERT Day #     1      Date: 09/24/20  On APL  Red Alert Reason: Got discharge papers? I Don't Know Know who to call about changes in condition? No Scheduled follow-up? No New prescriptions? I Don't Know Other questions/problems? Yes Insurance: Next Gen Medicare  [place] care [name])  [facility] admissions x  1 ED visits x 1 in the last 6 months  Last admission 09/20/20 to 09/22/20 for symptomatic anemia  Outreach attempt #1 successful Patient is able to verify HIPAA identifiers [name] reviewed and addressed red alert with patient   EMMI:  Pt confirms part of the EMMI answer were correct  She reports having poor telephone reception at her facility  She lives a [name] in independent living She sees her own MDs not the facility MDs and is aware to follow up with primary care provider (PCP) and cardiology She reports her sister may have her discharge sheet she is not sure She reports her sister handles all her medications and makes all her medical appointments and transports her to each  Reviewed her discharge sheet with her and she has all her medicines  She reports walking the hall ways of her facility daily with her walker and does not feel she needs extra PT but reports her pcp will instruction her on 09/28/20  Her questions were related to who to follow up with after discharge  Subjective  "I prefer to go to my own doctors"  "I walk the hallways in my building with my walker" " I have so many papers around here" "Please send me a copy of my discharge sheets"  Pat [name](sister) Objective  DME: walker reading glasses   Appointments: 09/28/20 primary care provider (PCP) Dr [name] 09/29/20 [name] cardiology  02/28/21 [name] Cardiology  Advance Directives: has on Epic chart    Consent: [name] [name]  CM reviewed [name] services with patient. Patient gave verbal consent for services [name] telephonic [name] CM.   Advised patient that there will be further automated EMMI- post discharge calls to assess how the patient is doing following the recent hospitalization Advised the patient that another call may be received from a nurse if any of their responses were abnormal. Patient voiced understanding and was appreciative of f/u call.   Plan: [name] CM will follow up within the next 30 days to see if patient received the sent discharge instructions and has any further needs Pt encouraged to return a call to [place] CM prn provided her with [name] CM number Routed note to MD   Erin Millin L. Lavina Hamman, [name], BSN, [name] Office number [phone] [name] number [phone] Fax number [phone]

## 2020-09-28 ENCOUNTER — Other Ambulatory Visit: Payer: Self-pay | Admitting: *Deleted

## 2020-09-28 DIAGNOSIS — I4891 Unspecified atrial fibrillation: Secondary | ICD-10-CM | POA: Diagnosis not present

## 2020-09-28 DIAGNOSIS — D509 Iron deficiency anemia, unspecified: Secondary | ICD-10-CM | POA: Diagnosis not present

## 2020-09-28 DIAGNOSIS — I2510 Atherosclerotic heart disease of native coronary artery without angina pectoris: Secondary | ICD-10-CM | POA: Diagnosis not present

## 2020-09-28 DIAGNOSIS — I10 Essential (primary) hypertension: Secondary | ICD-10-CM | POA: Diagnosis not present

## 2020-09-28 DIAGNOSIS — M810 Age-related osteoporosis without current pathological fracture: Secondary | ICD-10-CM | POA: Diagnosis not present

## 2020-09-28 DIAGNOSIS — E785 Hyperlipidemia, unspecified: Secondary | ICD-10-CM | POA: Diagnosis not present

## 2020-09-28 DIAGNOSIS — I350 Nonrheumatic aortic (valve) stenosis: Secondary | ICD-10-CM | POA: Diagnosis not present

## 2020-09-28 NOTE — Patient Outreach (Signed)
[facility]) Care Management  09/28/2020  JAYLEA PLOURDE March 11, 1929 [id number]   [place] coordination [facility] RN CM received call from patient's sister,Pat [name],requesting outreach to her if possible for [facility] follow up  Fraser Din report Mrs Dickerson has difficulty taking outreach calls Pat requests changing the EMMI outreach number to [phone] vs [phone] [facility] RN CM updated this in Epic, with [facility] CMA and left a message with Mrs [name] husband that the change has been made   Plan [facility] RN CM will follow up within the next 30 days to see if patient received the sent discharge instructions and has any further needs  Benny Deutschman L. Lavina Hamman, RN, BSN, [place] Coordinator Office number [phone] Mobile number [phone]  Main [facility] number [phone] Fax number [phone]

## 2020-09-29 ENCOUNTER — Ambulatory Visit (INDEPENDENT_AMBULATORY_CARE_PROVIDER_SITE_OTHER): Payer: Medicare Other | Admitting: Student

## 2020-09-29 ENCOUNTER — Other Ambulatory Visit: Payer: Self-pay

## 2020-09-29 ENCOUNTER — Encounter: Payer: Self-pay | Admitting: Student

## 2020-09-29 VITALS — BP 128/60 | HR 51 | Ht 60.0 in | Wt 119.2 lb

## 2020-09-29 DIAGNOSIS — I2510 Atherosclerotic heart disease of native coronary artery without angina pectoris: Secondary | ICD-10-CM | POA: Diagnosis not present

## 2020-09-29 DIAGNOSIS — D509 Iron deficiency anemia, unspecified: Secondary | ICD-10-CM | POA: Diagnosis not present

## 2020-09-29 DIAGNOSIS — R0989 Other specified symptoms and signs involving the circulatory and respiratory systems: Secondary | ICD-10-CM | POA: Diagnosis not present

## 2020-09-29 DIAGNOSIS — I350 Nonrheumatic aortic (valve) stenosis: Secondary | ICD-10-CM

## 2020-09-29 DIAGNOSIS — I10 Essential (primary) hypertension: Secondary | ICD-10-CM | POA: Diagnosis not present

## 2020-09-29 DIAGNOSIS — E785 Hyperlipidemia, unspecified: Secondary | ICD-10-CM

## 2020-09-29 DIAGNOSIS — I480 Paroxysmal atrial fibrillation: Secondary | ICD-10-CM

## 2020-09-29 MED ORDER — ASPIRIN EC 81 MG PO TBEC: 81.0000 mg | DELAYED_RELEASE_TABLET | Freq: Every day | ORAL | 3 refills | Status: DC

## 2020-09-29 NOTE — Patient Instructions (Signed)
Medication Instructions:  DECREASE your aspirin to 81 mg daily. *If you need a refill on your cardiac medications before your next appointment, please call your pharmacy*   Lab Work: We will contact your PCP for copies of your most recent lab work. If you have labs (blood work) drawn today and your tests are completely normal, you will receive your results only by: [name] Kitchen MyChart Message (if you have MyChart) OR . A paper copy in the mail If you have any lab test that is abnormal or we need to change your treatment, we will call you to review the results.   Testing/Procedures: We will follow up on the event monitor that was ordered for you.    Follow-Up: At [facility], you and your health needs are our priority.  As part of our continuing mission to provide you with exceptional heart care, we have created designated Provider Care Teams.  These Care Teams include your primary Cardiologist (physician) and Advanced Practice Providers (APPs -  Physician Assistants and Nurse Practitioners) who all work together to provide you with the care you need, when you need it.  We recommend signing up for the patient portal called "MyChart".  Sign up information is provided on this After Visit Summary.  MyChart is used to connect with patients for Virtual Visits (Telemedicine).  Patients are able to view lab/test results, encounter notes, upcoming appointments, etc.  Non-urgent messages can be sent to your provider as well.   To learn more about what you can do with MyChart, go to [email or web address].    Your next appointment:   3 month(s)  The format for your next appointment:   In Person  Provider:   [name], MD   Other Instructions Per your request, we will reach out to your sister to discuss instructions for your heart monitor.

## 2020-09-30 ENCOUNTER — Telehealth: Payer: Self-pay | Admitting: *Deleted

## 2020-09-30 NOTE — Telephone Encounter (Signed)
Patient asked for [place] to call her sister [name], regarding Cardiac Event Monitor. Described Cardiac event monitor and necessary functions like charging the cell phone nightly and changing the monitor batteries once a week.  She states, there is no way her sister is capable of performing those functions, and she lives too far to go there to help with these functions.   We discussed the possiblity of a 14 day ZIO AT long term monitor-Live Telemetry monitor instead if approved by Erin Rives, [name].  We can apply the ZIO AT patch and start the gateway.  The only things [name] would need to do is to keep the gateway within 10 feet and mail the gateway and patch back to [place] in 2 weeks.  [name] seemed to think this was a viable alternative. I have tentatively scheduled [name] to come to [employer] street office with her sister 10/05/2020 to have a ZIO AT applied if change of order approved.

## 2020-09-30 NOTE — Telephone Encounter (Signed)
Hi Shelly,  That's fine. We can do the Zio monitor instead.  Thank you! Isac Lincks

## 2020-10-03 ENCOUNTER — Telehealth: Payer: Self-pay | Admitting: Physician Assistant

## 2020-10-03 NOTE — Telephone Encounter (Signed)
Patient sister paged after our answering service due to concern of her medication.  Upon further discussion, her sister mentions that the patient has called her this morning complaining of feeling jittery.  Her sister is quite concerned that that the patient is currently on 4 different blood pressure medications.  I reassured her that patient's blood pressure and heart rate seems to be stable on the recent office visit.  I did instruct her sister to obtain her blood pressure and heart rate.  At this time, given concern of her cardiac issues, I recommended another office visit for reassessment.  Patient is usually quite anxious, her sister request our scheduler to contact her (the sister) to arrange follow-up for the patient.

## 2020-10-04 ENCOUNTER — Telehealth: Payer: Self-pay | Admitting: Cardiology

## 2020-10-04 ENCOUNTER — Telehealth: Payer: Self-pay | Admitting: *Deleted

## 2020-10-04 NOTE — Telephone Encounter (Signed)
Spoke with sister, ok per DPR yesterday patient was very anxious feeling, confused, and couldn't think clearly Mardene Celeste called office spoke with Janan Ridge PA Blood pressure was 133/83  Mardene Celeste went to see her and when she got there she was feeling better Patient  did not increase her Amlodipine to 10 mg daily as recommended by Dr Martinique and she does not want to wear monitor at this time Mardene Celeste just wanted Dr Martinique to be aware of these things Will forward to Dr Martinique for review

## 2020-10-04 NOTE — Telephone Encounter (Signed)
OK thanks  Collier Salina

## 2020-10-04 NOTE — Telephone Encounter (Signed)
Noticed appointment for Tuesday 10/12 for [name] to have a 14 day ZIO AT monitor applied had been cancelled. Called to let her sister know we had obtained permission to apply a monitor that was easier to use then the cardiac event monitor. According to patients sister [name], patient has decided she is not going to wear a monitor.

## 2020-10-04 NOTE — Telephone Encounter (Signed)
Patient's sister, Mardene Celeste, is requesting a call back from [place]. She did not want me to take a message. Please advise.

## 2020-10-11 ENCOUNTER — Other Ambulatory Visit: Payer: Self-pay

## 2020-10-11 ENCOUNTER — Other Ambulatory Visit: Payer: Self-pay | Admitting: *Deleted

## 2020-10-11 NOTE — Patient Outreach (Signed)
[facility]) Care Management  10/11/2020  SHEVELLE SMITHER 1929-10-07 [id number]  [facility] outreach and case closure for EMMI referred patient Mrs ISAIAH TOROK was referred to [facility] on 09/27/20 for EMMI-general discharge RED ON EMMI [id number] #     1      Date: 09/24/20 On APL  Red Alert Reason: Got discharge papers? I Don't Know Know who to call about changes in condition? No Scheduled follow-up? No New prescriptions? I Don't Know Other questions/problems? Yes  Insurance: [employer] Medicare  [place] care [facility])  [facility] admissions x  1 ED visits x 1 in the last 6 months  Last admission 09/20/20 to 09/22/20 for symptomatic anemia  Outreach attempt successful to her sister, Mrs Leanor Kail, with Mrs Romanski permission  Mardene Celeste is able to verify HIPAA identifiers Consent: [facility] RN CM reviewed [facility] services with patient. Patient gave verbal consent for services [facility] telephonic RN CM.   [facility] Care Management RN reviewed the [facility] program and the outreach for the red alert Mardene Celeste confirms Mrs Sherwood is at [facility] in the independent living area and does not receive much assistance, only a cleaning lady weekly. The patient is mainly independent Questions answered for Zoila Shutter confirms Mrs Bellissimo was assisted to her pcp and cardiology appointments  During these appointments the MDs indicated that Mrs Hefel was improving related to her anemia and atrial fibrillation.  [facility] RN CM assessed for other possible needs and none found Mardene Celeste confirmed no needs for Mrs Quant at this time [facility] RN CM offered to be available prn and discussed the [facility] case closure letter for [facility] program Mrs Demorest states if any needs arise she would outreach to [place]    DME: walker reading glasses   Plan: [facility] RN CM close case as goals met and no further needs identified at this time Mardene Celeste encouraged to return a call to [facility] RN CM prn Routed note to MD Letters sent to patient and  PCP   [name], RN, BSN, [place] Coordinator Office number [phone] [facility] number [phone] Fax number [phone]

## 2020-11-08 DIAGNOSIS — Z23 Encounter for immunization: Secondary | ICD-10-CM | POA: Diagnosis not present

## 2020-11-11 ENCOUNTER — Telehealth: Payer: Self-pay | Admitting: Cardiology

## 2020-11-11 NOTE — Telephone Encounter (Signed)
She has been taking this for some time without reaction so OK to continue  Davidlee Jeanbaptiste Martinique MD, [facility]

## 2020-11-11 NOTE — Telephone Encounter (Signed)
Pt c/o medication issue:  1. Name of Medication: amLODipine (NORVASC) 10 MG tablet  2. How are you currently taking this medication (dosage and times per day)? Not sure if pt is currently taking   3. Are you having a reaction (difficulty breathing--STAT)? No   4. What is your medication issue? Tiffany is calling stating this medication flagged as an allergy for the patient due to her being allergic to Felodipine. Due to this she is needing confirmation that it is okay for this patient to take this medication. The reference number is [id number]. Please advise.

## 2020-11-11 NOTE — Telephone Encounter (Signed)
Spoke with Morey Hummingbird from [place], okayed Amlodipine. Understanding verbalized. Pharmacy will refill medication as prescribed.

## 2020-12-15 ENCOUNTER — Emergency Department (HOSPITAL_COMMUNITY)
Admission: EM | Admit: 2020-12-15 | Discharge: 2020-12-15 | Disposition: A | Discharge status: Home / Self Care | Payer: Medicare Other | Attending: Emergency Medicine | Admitting: Emergency Medicine

## 2020-12-15 ENCOUNTER — Emergency Department (HOSPITAL_COMMUNITY): Payer: Medicare Other

## 2020-12-15 ENCOUNTER — Encounter (HOSPITAL_COMMUNITY): Payer: Self-pay | Admitting: Emergency Medicine

## 2020-12-15 DIAGNOSIS — I6523 Occlusion and stenosis of bilateral carotid arteries: Secondary | ICD-10-CM | POA: Diagnosis not present

## 2020-12-15 DIAGNOSIS — W01198A Fall on same level from slipping, tripping and stumbling with subsequent striking against other object, initial encounter: Secondary | ICD-10-CM | POA: Insufficient documentation

## 2020-12-15 DIAGNOSIS — Y9301 Activity, walking, marching and hiking: Secondary | ICD-10-CM | POA: Insufficient documentation

## 2020-12-15 DIAGNOSIS — S40012A Contusion of left shoulder, initial encounter: Secondary | ICD-10-CM | POA: Diagnosis not present

## 2020-12-15 DIAGNOSIS — S8002XA Contusion of left knee, initial encounter: Secondary | ICD-10-CM | POA: Insufficient documentation

## 2020-12-15 DIAGNOSIS — M25462 Effusion, left knee: Secondary | ICD-10-CM | POA: Diagnosis not present

## 2020-12-15 DIAGNOSIS — I10 Essential (primary) hypertension: Secondary | ICD-10-CM | POA: Diagnosis not present

## 2020-12-15 DIAGNOSIS — S01511A Laceration without foreign body of lip, initial encounter: Secondary | ICD-10-CM | POA: Diagnosis not present

## 2020-12-15 DIAGNOSIS — I2510 Atherosclerotic heart disease of native coronary artery without angina pectoris: Secondary | ICD-10-CM | POA: Diagnosis not present

## 2020-12-15 DIAGNOSIS — I6782 Cerebral ischemia: Secondary | ICD-10-CM | POA: Insufficient documentation

## 2020-12-15 DIAGNOSIS — M19012 Primary osteoarthritis, left shoulder: Secondary | ICD-10-CM | POA: Diagnosis not present

## 2020-12-15 DIAGNOSIS — Y92009 Unspecified place in unspecified non-institutional (private) residence as the place of occurrence of the external cause: Secondary | ICD-10-CM | POA: Insufficient documentation

## 2020-12-15 DIAGNOSIS — S199XXA Unspecified injury of neck, initial encounter: Secondary | ICD-10-CM | POA: Diagnosis not present

## 2020-12-15 DIAGNOSIS — Z87891 Personal history of nicotine dependence: Secondary | ICD-10-CM | POA: Insufficient documentation

## 2020-12-15 DIAGNOSIS — G319 Degenerative disease of nervous system, unspecified: Secondary | ICD-10-CM | POA: Diagnosis not present

## 2020-12-15 DIAGNOSIS — S0990XA Unspecified injury of head, initial encounter: Secondary | ICD-10-CM | POA: Insufficient documentation

## 2020-12-15 DIAGNOSIS — S0993XA Unspecified injury of face, initial encounter: Secondary | ICD-10-CM | POA: Diagnosis present

## 2020-12-15 DIAGNOSIS — W19XXXA Unspecified fall, initial encounter: Secondary | ICD-10-CM

## 2020-12-15 DIAGNOSIS — Z79899 Other long term (current) drug therapy: Secondary | ICD-10-CM | POA: Insufficient documentation

## 2020-12-15 DIAGNOSIS — M1712 Unilateral primary osteoarthritis, left knee: Secondary | ICD-10-CM | POA: Diagnosis not present

## 2020-12-15 DIAGNOSIS — Z7982 Long term (current) use of aspirin: Secondary | ICD-10-CM | POA: Diagnosis not present

## 2020-12-15 DIAGNOSIS — M47812 Spondylosis without myelopathy or radiculopathy, cervical region: Secondary | ICD-10-CM | POA: Insufficient documentation

## 2020-12-15 DIAGNOSIS — S8992XA Unspecified injury of left lower leg, initial encounter: Secondary | ICD-10-CM | POA: Diagnosis not present

## 2020-12-15 DIAGNOSIS — M2578 Osteophyte, vertebrae: Secondary | ICD-10-CM | POA: Diagnosis not present

## 2020-12-15 MED ORDER — ACETAMINOPHEN 325 MG PO TABS
650.0000 mg | ORAL_TABLET | Freq: Once | ORAL | Status: AC
  Administered 2020-12-15: 16:00:00 650 mg via ORAL
  Filled 2020-12-15: qty 2

## 2020-12-15 MED ORDER — LIDOCAINE-EPINEPHRINE-TETRACAINE (LET) TOPICAL GEL
3.0000 mL | Freq: Once | TOPICAL | Status: AC
  Administered 2020-12-15: 16:00:00 3 mL via TOPICAL
  Filled 2020-12-15: qty 3

## 2020-12-15 MED ORDER — LIDOCAINE HCL (PF) 1 % IJ SOLN
5.0000 mL | Freq: Once | INTRAMUSCULAR | Status: AC
  Administered 2020-12-15: 16:00:00 5 mL
  Filled 2020-12-15: qty 30

## 2020-12-15 NOTE — ED Notes (Signed)
An After Visit Summary was printed and given to the patient. °Discharge instructions given and no further questions at this time.  °Pt leaving with family member.  °

## 2020-12-15 NOTE — ED Triage Notes (Signed)
Patient tripped over the doorframe, her L shoulder hit the floor (minor bruising) and she has a inner bottom lip laceration, also reports L knee pain. Patient crawled to the door and pushed button for help, lives at [facility]

## 2020-12-15 NOTE — ED Provider Notes (Signed)
[facility] DEPT Provider Note   CSN: [id number] Arrival date & time: 12/15/20  1406     History Chief Complaint  Patient presents with  . Fall    [name] is a 86 y.o. female past medical history of CAD, hypertension, atrial fibrillation, osteopenia, scoliosis, presenting to the emergency department with complaint of pain after fall.  Patient states she was walking through the doorway of her laundry room when her foot caught on the threshold.  This caused her to fall forward and land on her left side.  She did hit her left face on the floor without loss of consciousness.  In doing so she bit her lower lip and has laceration.  She complains of left shoulder pain and left knee pain as well.  She has some pain to her left face, no vision changes, nausea or vomiting.  She is starting to feel more sore, including in her left neck.  No numbness, chest pain, abdominal pain, dental pain or loose teeth.  She takes 81 mg of aspirin daily, no other anticoagulation.  The history is provided by the patient.       Past Medical History:  Diagnosis Date  . Anxiety and depression   . Aortic stenosis    a. 03/2014: Mild aortic stenosis by valve area and mean gradient though appeared more visually consistent with moderate AS.   [name] Kitchen CAD (coronary artery disease)    a. s/p prior PCI of D2 in 1992 b. stent to RCA in 2005  . Diverticula of colon   . GERD (gastroesophageal reflux disease)   . HTN (hypertension)   . Hypercholesteremia   . Lateral myocardial infarction ([place]) 1992  . Osteoarthritis   . Osteopenia   . Scoliosis     Patient Active Problem List   Diagnosis Date Noted  . Elevated troponin   . Symptomatic anemia 09/20/2020  . Atrial fibrillation with RVR ([place]) 09/20/2020  . Dizzy 06/05/2013  . Aortic stenosis, mild   . CAD (coronary artery disease)   . HTN (hypertension)   . Hypercholesteremia   . Anxiety and depression   . Lateral myocardial  infarction ([place])   . Scoliosis   . GERD 06/09/2009  . PERSONAL HISTORY OF COLONIC POLYPS 06/09/2009    Past Surgical History:  Procedure Laterality Date  . ABDOMINAL HYSTERECTOMY    . APPENDECTOMY    . CORONARY ANGIOPLASTY    . TONSILLECTOMY       OB History   No obstetric history on file.     Family History  Problem Relation Age of Onset  . Hypertension Father   . Heart failure Mother   . Hypertension Mother   . Cancer Brother     Social History   Tobacco Use  . Smoking status: Former Smoker    Types: Cigarettes    Quit date: 12/26/1991    Years since quitting: 28.9  . Smokeless tobacco: Never Used  Vaping Use  . Vaping Use: Never used  Substance Use Topics  . Alcohol use: No    Alcohol/week: 0.0 standard drinks  . Drug use: Yes    Types: Methylphenidate    Home Medications Prior to Admission medications   Medication Sig Start Date End Date Taking? Authorizing Provider  amLODipine (NORVASC) 10 MG tablet Take 1 tablet (10 mg total) by mouth daily. 09/23/20   Amin, Jeanella Flattery, MD  aspirin EC 81 MG tablet Take 1 tablet (81 mg total) by mouth daily. Swallow  whole. 09/29/20   [name] E, PA-C  atorvastatin (LIPITOR) 40 MG tablet TAKE 1 TABLET DAILY 07/07/20   Martinique, Peter M, MD  cholecalciferol (VITAMIN D) 1000 UNITS tablet Take 1,000 Units by mouth 2 (two) times daily.    [provider]  ferrous sulfate 325 (65 FE) MG tablet Take 1 tablet (325 mg total) by mouth daily. 09/22/20 09/22/21  Amin, Jeanella Flattery, MD  hydrochlorothiazide (HYDRODIURIL) 25 MG tablet Take 1 tablet (25 mg total) by mouth daily. 08/20/19   Martinique, Peter M, MD  losartan (COZAAR) 100 MG tablet Take 1 tablet (100 mg total) by mouth daily. 09/17/20 09/12/21  Martinique, Peter M, MD  Melatonin 1 MG CAPS Take 1 capsule by mouth every evening.     [provider]  metoprolol succinate (TOPROL-XL) 50 MG 24 hr tablet TAKE 1 TABLET BY MOUTH EVERY DAY WITH OR RIGHT AFTER MEAL 05/23/19    Martinique, Peter M, MD  nitroGLYCERIN (NITROSTAT) 0.4 MG SL tablet Place 1 tablet (0.4 mg total) under the tongue every 5 (five) minutes as needed for chest pain. 09/23/20   Martinique, Peter M, MD    Allergies    Nifedipine, Tetanus toxoids, and Adhesive [tape]  Review of Systems   Review of Systems  Musculoskeletal: Positive for arthralgias, myalgias and neck pain.  Skin: Positive for wound.  All other systems reviewed and are negative.   Physical Exam Updated Vital Signs BP (!) 159/88   Pulse 70   Temp 98 F (36.7 C) (Oral)   Resp 18   Ht 5' 2.5" (1.588 m)   Wt 56 kg   SpO2 99%   BMI 22.22 kg/m   Physical Exam Vitals and nursing note reviewed.  Constitutional:      General: She is not in acute distress.    Appearance: She is well-developed and well-nourished.  HENT:     Head: Normocephalic.     Comments: No bruising or deformity to the face.  No significant bony tenderness. There is a full-thickness 1cm laceration to the lower lip within the vermilion border.  Slightly oozing, not actively bleeding. Dentition is atraumatic.    Ears:     Comments: No hemotympanum Eyes:     Conjunctiva/sclera: Conjunctivae normal.  Cardiovascular:     Rate and Rhythm: Normal rate.     Pulses: Normal pulses.  Pulmonary:     Effort: Pulmonary effort is normal.     Breath sounds: Normal breath sounds.  Abdominal:     General: Bowel sounds are normal.     Palpations: Abdomen is soft.     Tenderness: There is no abdominal tenderness.  Musculoskeletal:     Comments: Mild bruising noted to anterior aspect left shoulder joint. No deformity or large swelling, nl aROM  Though does cause some pain. Elbow/wrist/hand are nl.  Left knee with superficial abrasion, bruising noted. Very slight swelling noted, no deformity, normal weight bearing.  No TTP to midline C, T, or L spine or paraspinal TTP. Nl ROM of neck though does have some mild pain. TTP to left lateral musculature of the neck.   Skin:     General: Skin is warm.  Neurological:     Mental Status: She is alert.     Comments: Pt alert with normal mentation. No aphasia.  CN intact; normal motor and sensation to the face, EOM nl, PERRL.  Equal grip strength and dorsi/plantar flexion BLE.    Psychiatric:        Mood and  Affect: Mood and affect normal.        Behavior: Behavior normal.     ED Results / Procedures / Treatments   Labs (all labs ordered are listed, but only abnormal results are displayed) Labs Reviewed - No data to display  EKG None  Radiology CT Head Wo Contrast  Result Date: 12/15/2020 CLINICAL DATA:  Fall injury EXAM: CT HEAD WITHOUT CONTRAST TECHNIQUE: Contiguous axial images were obtained from the base of the skull through the vertex without intravenous contrast. COMPARISON:  None. FINDINGS: Brain: No evidence of acute territorial infarction, hemorrhage, hydrocephalus,extra-axial collection or mass lesion/mass effect. There is dilatation the ventricles and sulci consistent with age-related atrophy. Low-attenuation changes in the deep white matter consistent with small vessel ischemia. Vascular: No hyperdense vessel or unexpected calcification. Skull: The skull is intact. No fracture or focal lesion identified. Sinuses/Orbits: The visualized paranasal sinuses and mastoid air cells are clear. The orbits and globes intact. Other: None Cervical spine: Alignment: There is a minimal anterolisthesis of C2 on C3 and minimal retrolisthesis of C3 on C4. Grade 1 anterolisthesis of C6 on C7 is seen measuring 3 mm. Skull base and vertebrae: Visualized skull base is intact. No atlanto-occipital dissociation. The vertebral body heights are well maintained. No fracture or pathologic osseous lesion seen. Soft tissues and spinal canal: The visualized paraspinal soft tissues are unremarkable. No prevertebral soft tissue swelling is seen. The spinal canal is grossly unremarkable, no large epidural collection or significant canal  narrowing. Disc levels: Multilevel cervical spine spondylosis is noted with disc osteophyte complex and uncovertebral osteophytes most notable C3-C4 with severe bilateral neural foraminal narrowing and mild effacement anterior thecal sac. Upper chest: The lung apices are clear. Thoracic inlet is within normal limits. Other: Scattered calcifications seen throughout the bilateral carotid arteries. IMPRESSION: No acute intracranial abnormality. Findings consistent with age related atrophy and chronic small vessel ischemia No acute fracture or malalignment of the spine. Cervical spine spondylosis most notable at C3-C4. Electronically Signed   By: Jonna Clark M.D.   On: 12/15/2020 17:01   CT Cervical Spine Wo Contrast  Result Date: 12/15/2020 CLINICAL DATA:  Fall injury EXAM: CT HEAD WITHOUT CONTRAST TECHNIQUE: Contiguous axial images were obtained from the base of the skull through the vertex without intravenous contrast. COMPARISON:  None. FINDINGS: Brain: No evidence of acute territorial infarction, hemorrhage, hydrocephalus,extra-axial collection or mass lesion/mass effect. There is dilatation the ventricles and sulci consistent with age-related atrophy. Low-attenuation changes in the deep white matter consistent with small vessel ischemia. Vascular: No hyperdense vessel or unexpected calcification. Skull: The skull is intact. No fracture or focal lesion identified. Sinuses/Orbits: The visualized paranasal sinuses and mastoid air cells are clear. The orbits and globes intact. Other: None Cervical spine: Alignment: There is a minimal anterolisthesis of C2 on C3 and minimal retrolisthesis of C3 on C4. Grade 1 anterolisthesis of C6 on C7 is seen measuring 3 mm. Skull base and vertebrae: Visualized skull base is intact. No atlanto-occipital dissociation. The vertebral body heights are well maintained. No fracture or pathologic osseous lesion seen. Soft tissues and spinal canal: The visualized paraspinal soft  tissues are unremarkable. No prevertebral soft tissue swelling is seen. The spinal canal is grossly unremarkable, no large epidural collection or significant canal narrowing. Disc levels: Multilevel cervical spine spondylosis is noted with disc osteophyte complex and uncovertebral osteophytes most notable C3-C4 with severe bilateral neural foraminal narrowing and mild effacement anterior thecal sac. Upper chest: The lung apices are clear. Thoracic inlet is within  normal limits. Other: Scattered calcifications seen throughout the bilateral carotid arteries. IMPRESSION: No acute intracranial abnormality. Findings consistent with age related atrophy and chronic small vessel ischemia No acute fracture or malalignment of the spine. Cervical spine spondylosis most notable at C3-C4. Electronically Signed   By: Prudencio Pair M.D.   On: 12/15/2020 17:01   DG Shoulder Left  Result Date: 12/15/2020 CLINICAL DATA:  Recent fall with left shoulder pain, initial encounter EXAM: LEFT SHOULDER - 2+ VIEW COMPARISON:  None. FINDINGS: Degenerative changes of the acromioclavicular and glenohumeral joints are seen. The humeral head is high-riding likely related to chronic rotator cuff injury. No acute fracture is seen. The underlying bony thorax is within normal limits. IMPRESSION: Chronic degenerative changes without acute abnormality. Electronically Signed   By: Inez Catalina M.D.   On: 12/15/2020 15:13   DG Knee Complete 4 Views Left  Result Date: 12/15/2020 CLINICAL DATA:  86 year old female with fall and trauma to the left knee. EXAM: LEFT KNEE - COMPLETE 4+ VIEW COMPARISON:  None. FINDINGS: There is no acute fracture or dislocation. The bones are osteopenic. Mild arthritic changes of the knee. There is a small suprapatellar effusion. Vascular calcifications noted. The soft tissues are unremarkable. IMPRESSION: 1. No acute fracture or dislocation. 2. Osteopenia and mild arthritic changes of the knee. Electronically Signed    By: Anner Crete M.D.   On: 12/15/2020 15:13    Procedures .[name] KitchenLaceration Repair  Date/Time: 12/15/2020 6:26 PM Performed by: Bhavika Schnider, Martin[place] N, PA-C Authorized by: Paulett Kaufhold, [place] N, PA-C   Consent:    Consent obtained:  Verbal   Consent given by:  Patient   Risks discussed:  Infection, pain and poor cosmetic result Universal protocol:    Patient identity confirmed:  Verbally with patient Anesthesia:    Anesthesia method:  Local infiltration and topical application   Topical anesthetic:  LET   Local anesthetic:  Lidocaine 1% w/o epi Laceration details:    Location:  Lip   Lip location:  Lower lip, full thickness   Vermilion border involved: no     Length (cm):  1 Pre-procedure details:    Preparation:  Patient was prepped and draped in usual sterile fashion Exploration:    Hemostasis achieved with:  LET   Imaging outcome: foreign body not noted     Wound exploration: entire depth of wound visualized     Contaminated: no   Treatment:    Area cleansed with:  Saline   Amount of cleaning:  Standard Skin repair:    Repair method:  Sutures   Suture size:  6-0   Wound skin closure material used: vicryl.   Suture technique:  Simple interrupted   Number of sutures:  3 (1 mucosal aspect of lower lip, 2 lower lip) Approximation:    Approximation:  Close   Vermilion border well-aligned: yes   Repair type:    Repair type:  Simple Post-procedure details:    Dressing:  Open (no dressing)   Procedure completion:  Tolerated well, no immediate complications   (including critical care time)  Medications Ordered in ED Medications  acetaminophen (TYLENOL) tablet 650 mg (650 mg Oral Given 12/15/20 1605)  lidocaine (PF) (XYLOCAINE) 1 % injection 5 mL (5 mLs Infiltration Given by Other 12/15/20 1606)  lidocaine-EPINEPHrine-tetracaine (LET) topical gel (3 mLs Topical Given by Other 12/15/20 1606)    ED Course  I have reviewed the triage vital signs and the nursing  notes.  Pertinent labs & imaging results that were available  during my care of the patient were reviewed by me and considered in my medical decision making (see chart for details).    MDM Rules/Calculators/A&P                          Patient presenting to the ED from independent living facility after mechanical fall today.  She tripped on the floor as she was going to the threshold of the doorway, fell onto her left side and hit her left face on the floor.  No loss of consciousness.  Not anticoagulated.  She is at her baseline mental status, no focal neuro deficits are appreciated on exam.  She has full-thickness lip laceration to the lower lip is repaired at bedside with absorbable sutures.  Hemostasis was achieved with LET.  CT imaging of head and C-spine as well as plain films of the shoulder and knee are negative.  She is able to bear weight on her left leg on examination.  She is in no acute distress.  Recommend symptomatic management, close PCP follow-up and strict return precautions.  Discussed wound care, and eating soft foods with a spoon until lip heals.  Patient is appropriate for discharge  Patient was discussed with and evaluated by Dr. Fulton Reek.  Discussed results, findings, treatment and follow up. Patient advised of return precautions. Patient verbalized understanding and agreed with plan.  Final Clinical Impression(s) / ED Diagnoses Final diagnoses:  Fall in home, initial encounter  Contusion of left shoulder, initial encounter  Contusion of left knee, initial encounter  Lip laceration, initial encounter    Rx / DC Orders ED Discharge Orders    None       Alinah Sheard, [place] N, PA-C 12/15/20 [place], [place], DO 12/15/20 2227

## 2020-12-15 NOTE — Discharge Instructions (Signed)
Please read instructions below. Apply ice to your areas of pain for 20 minutes at a time. Eat soft foods and eat with a spoon until your lip heals. Rinse your mouth with warm water after meals.  You can take tylenol every 6 hours as needed for pain. Schedule an appointment with your primary care provider for follow-up on your injuries. Return to the ER for severe headache, vomiting, balance problems, vision changes, confusion, signs of wound infection, or new or concerning symptoms.

## 2020-12-15 NOTE — ED Notes (Signed)
ED Provider at bedside. 

## 2020-12-28 DIAGNOSIS — R2681 Unsteadiness on feet: Secondary | ICD-10-CM | POA: Diagnosis not present

## 2020-12-28 DIAGNOSIS — M6281 Muscle weakness (generalized): Secondary | ICD-10-CM | POA: Diagnosis not present

## 2020-12-28 DIAGNOSIS — Z9181 History of falling: Secondary | ICD-10-CM | POA: Diagnosis not present

## 2021-01-11 DIAGNOSIS — R2681 Unsteadiness on feet: Secondary | ICD-10-CM | POA: Diagnosis not present

## 2021-01-11 DIAGNOSIS — M6281 Muscle weakness (generalized): Secondary | ICD-10-CM | POA: Diagnosis not present

## 2021-01-11 DIAGNOSIS — Z9181 History of falling: Secondary | ICD-10-CM | POA: Diagnosis not present

## 2021-01-16 NOTE — Progress Notes (Signed)
Cardiology Office Note    Date:  01/19/2021   ID:  [name], DOB [birth year], MRN [id number]  PCP:  Martha Clan, MD  Cardiologist:  Zaevion Parke [place], MD    History of Present Illness:  [name] is a 87 y.o. female who has a history of coronary disease and is s/p prior angioplasty of the diagonal branch in 1992. She had stenting of the right coronary in 2005. She has a history of hypertension. She has a history of mild-moderate aortic stenosis by Echo in 2015. Repeat in October 2019 was unchanged. She has chronic back pain related to scoliosis.  She is now living at [facility].  She was seen by me on 09/14/20 and was doing OK. She was admitted shortly after from 09/20/2020 to 09/22/2020 for atrial fibrillation after presenting with chest pain and shortness of breath but no palpitations. Rates were as high as the 190's. She was cardioverted in the field by EMS with restoration of sinus rhythm. Chest pain resolved with restoration of sinus rhythm. In the ED, she was found to have acute anemia with hemoglobin of 7.7 (down from 9.1 on 09/17/2020 and 13.8 in 09/2019). High-sensitivity troponin peaked at 603 but this was felt to due to demand ischemia from atrial fibrillation with RVR and anemia.  Echo showed LVEF of 65-70% with normal wall motion, grade 1 diastolic dysfunction, severely dilated left atrium, moderate aortic stenosis, mild mitral regurgitation. In regards to anemia work-up, hemoccult was negative but iron and ferritin were low consistent with iron deficiency anemia. She was transfused but did develop some itching post-transfusion. She also received one dose of IV iron and then started on PO regimen. Patient was not interested in inpatient endoscopic evaluation given no hematochezia or melena. Hemoglobin improved to 10.2 on day of discharge. CHA2DS2-VASc = 5 (CAD, HTN, age x2, female). However, given acute anemia, anticoagulation was not started. There was also concern that she may be  at increased risk of anticoagulation due to confusion demonstrated during admission and increased risk of falls.  She was seen in the ED in Dec following a fall. Foot got caught and she fell. Had lacerated lip and bruises but no fracture. Had some swelling in the left ankle that is better now.   On follow up today she is seen with her sister. She reports she is doing well. No further falls. Is not aware of any heart racing, palpitations, SOB, or chest pain. States she doesn't really do anything. Appetite is poor but she attributes this to the fact the food is poor.    Past Medical History:  Diagnosis Date  . Anxiety and depression   . Aortic stenosis    a. 03/2014: Mild aortic stenosis by valve area and mean gradient though appeared more visually consistent with moderate AS.   [name] Kitchen CAD (coronary artery disease)    a. s/p prior PCI of D2 in 1992 b. stent to RCA in 2005  . Diverticula of colon   . GERD (gastroesophageal reflux disease)   . HTN (hypertension)   . Hypercholesteremia   . Lateral myocardial infarction (HCC) 1992  . Osteoarthritis   . Osteopenia   . Scoliosis     Past Surgical History:  Procedure Laterality Date  . ABDOMINAL HYSTERECTOMY    . APPENDECTOMY    . CORONARY ANGIOPLASTY    . TONSILLECTOMY      Current Medications: Outpatient Medications Prior to Visit  Medication Sig Dispense Refill  . amLODipine (NORVASC) 10  MG tablet Take 1 tablet (10 mg total) by mouth daily. 30 tablet 0  . aspirin EC 81 MG tablet Take 1 tablet (81 mg total) by mouth daily. Swallow whole. 90 tablet 3  . atorvastatin (LIPITOR) 40 MG tablet TAKE 1 TABLET DAILY 90 tablet 3  . cholecalciferol (VITAMIN D) 1000 UNITS tablet Take 1,000 Units by mouth 2 (two) times daily.    . ferrous sulfate 325 (65 FE) MG tablet Take 1 tablet (325 mg total) by mouth daily. 30 tablet 3  . hydrochlorothiazide (HYDRODIURIL) 25 MG tablet Take 1 tablet (25 mg total) by mouth daily. 90 tablet 3  . losartan (COZAAR)  100 MG tablet Take 1 tablet (100 mg total) by mouth daily. 90 tablet 3  . Melatonin 1 MG CAPS Take 1 capsule by mouth every evening.     . metoprolol succinate (TOPROL-XL) 50 MG 24 hr tablet TAKE 1 TABLET BY MOUTH EVERY DAY WITH OR RIGHT AFTER MEAL 90 tablet 3  . nitroGLYCERIN (NITROSTAT) 0.4 MG SL tablet Place 1 tablet (0.4 mg total) under the tongue every 5 (five) minutes as needed for chest pain. 25 tablet 11   No facility-administered medications prior to visit.     Allergies:   Nifedipine, Tetanus toxoids, and Adhesive [tape]   Social History   Socioeconomic History  . Marital status: Divorced    Spouse name: Not on file  . Number of children: 1  . Years of education: Not on file  . Highest education level: Not on file  Occupational History  . Occupation: retired  Tobacco Use  . Smoking status: Former Smoker    Types: Cigarettes    Quit date: 12/26/1991    Years since quitting: 29.0  . Smokeless tobacco: Never Used  Vaping Use  . Vaping Use: Never used  Substance and Sexual Activity  . Alcohol use: No    Alcohol/week: 0.0 standard drinks  . Drug use: Yes    Types: Methylphenidate  . Sexual activity: Not on file  Other Topics Concern  . Not on file  Social History Narrative  . Not on file   Social Determinants of Health   Financial Resource Strain: Not on file  Food Insecurity: Not on file  Transportation Needs: Not on file  Physical Activity: Not on file  Stress: Not on file  Social Connections: Not on file     Family History:  The patient's family history includes Cancer in her brother; Heart failure in her mother; Hypertension in her father and mother.   ROS:   Please see the history of present illness.    ROS All other systems reviewed and are negative.   PHYSICAL EXAM:   VS:  BP (!) 156/81   Pulse 78   Ht 5' (1.524 m)   Wt 118 lb 9.6 oz (53.8 kg)   SpO2 96%   BMI 23.16 kg/m    GENERAL:  Well appearing, elderly WF in NAD HEENT:  PERRL, EOMI,  sclera are clear. Oropharynx is clear. NECK:  No jugular venous distention, carotid upstroke brisk and symmetric, no bruits, no thyromegaly or adenopathy LUNGS:  Clear to auscultation bilaterally CHEST:  Unremarkable HEART:  RRR,  PMI not displaced or sustained,S1 and S2 within normal limits, no S3, no S4: no clicks, no rubs, gr 3/6 harsh systolic murmur RUSB radiates to carotids. EXT:  2 + pulses throughout, no edema, no cyanosis no clubbing SKIN:  Warm and dry.  No rashes NEURO:  Alert and oriented x  3. Cranial nerves II through XII intact. PSYCH:  Cognitively intact   Wt Readings from Last 3 Encounters:  01/19/21 118 lb 9.6 oz (53.8 kg)  12/15/20 123 lb 7.3 oz (56 kg)  09/29/20 119 lb 3.2 oz (54.1 kg)      Studies/Labs Reviewed:    Recent Labs: 09/17/2020: ALT 16 09/21/2020: TSH 1.442 09/22/2020: BUN 8; Creatinine, Ser 0.60; Hemoglobin 10.2; Magnesium 2.0; Platelets 221; Potassium 3.5; Sodium 139   Lipid Panel    Component Value Date/Time   CHOL 138 09/17/2020 1559   TRIG 70 09/17/2020 1559   HDL 73 09/17/2020 1559   CHOLHDL 1.9 09/17/2020 1559   CHOLHDL 2 08/22/2013 0946   VLDL 24.0 08/22/2013 0946   LDLCALC 51 09/17/2020 1559    Additional studies/ records that were reviewed today include:  Labs from 07/28/16: cholesterol 156, triglycerides 42, LDL 83, HDL 65. CMET, TSH, CBC all normal.  Dated 08/06/17: cholesterol 145, triglycerides 67, HDL 59, LDL 73, CMET, CBC normal. TSH normal.  Dated 08/12/18: cholesterol 150, triglycerides 72, HDL 65, LDL 71. Chemistries, Hgb, and TSH normal.   Echo 09/30/18: Study Conclusions  - Left ventricle: The cavity size was normal. Wall thickness was   increased in a pattern of moderate LVH. There was focal basal   hypertrophy. Systolic function was normal. The estimated ejection   fraction was in the range of 60% to 65%. Wall motion was normal;   there were no regional wall motion abnormalities. - Aortic valve: Moderately calcified  annulus. Moderately thickened,   moderately calcified leaflets. There was mild to moderate   stenosis. Valve area (VTI): 1.39 cm^2. Valve area (Vmax): 1.29   cm^2. Valve area (Vmean): 1.12 cm^2. - Pulmonary arteries: PA peak pressure: 31 mm Hg (S).  Echocardiogram 09/21/2020: Impressions: 1. Left ventricular ejection fraction, by estimation, is 65 to 70%. The  left ventricle has normal function. The left ventricle has no regional  wall motion abnormalities. There is mild left ventricular hypertrophy.  Left ventricular diastolic parameters  are consistent with Grade I diastolic dysfunction (impaired relaxation).  Elevated left ventricular end-diastolic pressure. The E/e' is 45.  2. Right ventricular systolic function is normal. The right ventricular  size is normal. There is mildly elevated pulmonary artery systolic  pressure. The estimated right ventricular systolic pressure is 16.1 mmHg.  3. Left atrial size was severely dilated.  4. Right atrial size was mildly dilated.  5. The mitral valve is abnormal. Mild mitral valve regurgitation.  6. The aortic valve is tricuspid. Aortic valve regurgitation is not  visualized. Moderate aortic valve stenosis. Aortic valve area, by VTI  measures 1.07 cm. Aortic valve mean gradient measures 20.0 mmHg. Aortic  valve Vmax measures 3.02 m/s. Peak  gradient 37 mmHg. DI 0.34  7. The inferior vena cava is normal in size with <50% respiratory  variability, suggesting right atrial pressure of 8 mmHg.   Comparison(s): Prior images unable to be directly viewed, comparison made by report only. Changes from prior study are noted. 09/30/2018: LVEF 60-65%, mild to moderate AS -mean/peak gradient 13/26 mmHg.    ASSESSMENT:    1. Paroxysmal atrial fibrillation (HCC)   2. Coronary artery disease involving native coronary artery of native heart without angina pectoris   3. Aortic valve stenosis, etiology of cardiac valve disease unspecified   4.  Primary hypertension   5. Hyperlipidemia, unspecified hyperlipidemia type   6. Iron deficiency anemia, unspecified iron deficiency anemia type      PLAN:  In  order of problems listed above:  1. Paroxysmal Atrial Fibrillation - admitted in September with atrial fibrillation with RVR but converted back to normal sinus rhythm with DCCV. - Maintaining sinus rhythm.  - Continue Toprol-XL 50mg  daily. - CHA2DS2-VASc = 5 (CAD, HTN, age x2, female). However, patient was not started on anticoagulation given acute anemia and risk for falls. Since no recurrent Afib to date will not push for anticoagulation now.   2. CAD Demand Ischemia - History of prior stenting to D2 in 1992 and RCA in 2005. - Patient did have chest pain and elevated troponin inn September. However, chest pain resolved with restoration of sinus rhythm. Troponin elevation felt to be due to demand ischemia in setting of atrial fibrillation with RVR, DCCV, and acute anemia.  - No recurrent chest pain.  - Continue aspirin, beta-blocker, and statin.  3. Moderate Aortic Stenosis - Echo on 09/21/2020 during recent admission showed LVEF of 65-70% with normal wall motion and moderate AS. - Can continue to monitor as outpatient.  4. Hypertension - BP has been well controlled. - Continue current medications: Amlodipine 10mg  daily, HCTZ 25mg  daily, Losartan 100mg  daily, and Toprol-XL 50mg  daily.  6.Hyperlipidemia - Continue Lipitor 40mg  daily.  7. Iron Deficiency Anemia - Patient presented with hemoglobin of 7.7 in September. Treated with transfusion of PRBCs and IV iron.  Hemoglobin 10.2 on discharge. Of note, no GI evaluation was performed during admission. - Continue iron supplement. - follow up per Dr Brigitte Pulse   Follow up in 6 months.   Signed, Seraya Jobst [place], MD  01/19/2021 2:18 PM    [facility] [address], [place], [place], [address] [phone]

## 2021-01-17 DIAGNOSIS — Z9181 History of falling: Secondary | ICD-10-CM | POA: Diagnosis not present

## 2021-01-17 DIAGNOSIS — M6281 Muscle weakness (generalized): Secondary | ICD-10-CM | POA: Diagnosis not present

## 2021-01-17 DIAGNOSIS — R2681 Unsteadiness on feet: Secondary | ICD-10-CM | POA: Diagnosis not present

## 2021-01-18 ENCOUNTER — Telehealth: Payer: Self-pay | Admitting: Cardiology

## 2021-01-18 NOTE — Telephone Encounter (Signed)
Spoke with patient's sister Mardene Celeste.Stated she wanted to ask Dr.Jordan if he would fill out a [place] 2 form so patient can be transferred to assisted living.Stated she has appointment with PCP Dr.Shaw 2/14 but [place] wanted to get patient transferred before then.Stated patient is more confused,unable to remember.She is not sure if she is taking medications correctly. Spoke to Perla he advised PCP will have to fill out form.Advised to call PCP to see if they can see her sooner.

## 2021-01-18 NOTE — Telephone Encounter (Signed)
Returned call. Sister requesting to speak specifically to Dr. Doug Sou nurse. Message forwarded.

## 2021-01-18 NOTE — Telephone Encounter (Signed)
Patient's sister called an said she need to talk with Dr. Doug Sou nurse before appt tomorrow. Needs to discuss housing with Dr. Martinique and nurse before sister comes to appt as well. Says that call is very important. Please call back

## 2021-01-19 ENCOUNTER — Ambulatory Visit (INDEPENDENT_AMBULATORY_CARE_PROVIDER_SITE_OTHER): Payer: Medicare Other | Admitting: Cardiology

## 2021-01-19 ENCOUNTER — Other Ambulatory Visit: Payer: Self-pay

## 2021-01-19 ENCOUNTER — Encounter: Payer: Self-pay | Admitting: Cardiology

## 2021-01-19 VITALS — BP 156/81 | HR 78 | Ht 60.0 in | Wt 118.6 lb

## 2021-01-19 DIAGNOSIS — I480 Paroxysmal atrial fibrillation: Secondary | ICD-10-CM

## 2021-01-19 DIAGNOSIS — E785 Hyperlipidemia, unspecified: Secondary | ICD-10-CM

## 2021-01-19 DIAGNOSIS — I2510 Atherosclerotic heart disease of native coronary artery without angina pectoris: Secondary | ICD-10-CM

## 2021-01-19 DIAGNOSIS — D509 Iron deficiency anemia, unspecified: Secondary | ICD-10-CM

## 2021-01-19 DIAGNOSIS — I350 Nonrheumatic aortic (valve) stenosis: Secondary | ICD-10-CM | POA: Diagnosis not present

## 2021-01-19 DIAGNOSIS — I10 Essential (primary) hypertension: Secondary | ICD-10-CM | POA: Diagnosis not present

## 2021-01-20 DIAGNOSIS — M6281 Muscle weakness (generalized): Secondary | ICD-10-CM | POA: Diagnosis not present

## 2021-01-20 DIAGNOSIS — R2681 Unsteadiness on feet: Secondary | ICD-10-CM | POA: Diagnosis not present

## 2021-01-20 DIAGNOSIS — Z9181 History of falling: Secondary | ICD-10-CM | POA: Diagnosis not present

## 2021-01-25 DIAGNOSIS — R41841 Cognitive communication deficit: Secondary | ICD-10-CM | POA: Diagnosis not present

## 2021-01-25 DIAGNOSIS — M6281 Muscle weakness (generalized): Secondary | ICD-10-CM | POA: Diagnosis not present

## 2021-01-25 DIAGNOSIS — R2681 Unsteadiness on feet: Secondary | ICD-10-CM | POA: Diagnosis not present

## 2021-01-25 DIAGNOSIS — Z9181 History of falling: Secondary | ICD-10-CM | POA: Diagnosis not present

## 2021-01-27 DIAGNOSIS — R2681 Unsteadiness on feet: Secondary | ICD-10-CM | POA: Diagnosis not present

## 2021-01-27 DIAGNOSIS — M6281 Muscle weakness (generalized): Secondary | ICD-10-CM | POA: Diagnosis not present

## 2021-01-27 DIAGNOSIS — R41841 Cognitive communication deficit: Secondary | ICD-10-CM | POA: Diagnosis not present

## 2021-01-27 DIAGNOSIS — Z9181 History of falling: Secondary | ICD-10-CM | POA: Diagnosis not present

## 2021-01-31 DIAGNOSIS — Z9181 History of falling: Secondary | ICD-10-CM | POA: Diagnosis not present

## 2021-01-31 DIAGNOSIS — R2681 Unsteadiness on feet: Secondary | ICD-10-CM | POA: Diagnosis not present

## 2021-01-31 DIAGNOSIS — R41841 Cognitive communication deficit: Secondary | ICD-10-CM | POA: Diagnosis not present

## 2021-01-31 DIAGNOSIS — M6281 Muscle weakness (generalized): Secondary | ICD-10-CM | POA: Diagnosis not present

## 2021-02-01 DIAGNOSIS — Z9181 History of falling: Secondary | ICD-10-CM | POA: Diagnosis not present

## 2021-02-01 DIAGNOSIS — R41841 Cognitive communication deficit: Secondary | ICD-10-CM | POA: Diagnosis not present

## 2021-02-01 DIAGNOSIS — R2681 Unsteadiness on feet: Secondary | ICD-10-CM | POA: Diagnosis not present

## 2021-02-01 DIAGNOSIS — M6281 Muscle weakness (generalized): Secondary | ICD-10-CM | POA: Diagnosis not present

## 2021-02-03 DIAGNOSIS — R2681 Unsteadiness on feet: Secondary | ICD-10-CM | POA: Diagnosis not present

## 2021-02-03 DIAGNOSIS — R41841 Cognitive communication deficit: Secondary | ICD-10-CM | POA: Diagnosis not present

## 2021-02-03 DIAGNOSIS — M6281 Muscle weakness (generalized): Secondary | ICD-10-CM | POA: Diagnosis not present

## 2021-02-03 DIAGNOSIS — Z9181 History of falling: Secondary | ICD-10-CM | POA: Diagnosis not present

## 2021-02-07 DIAGNOSIS — I2510 Atherosclerotic heart disease of native coronary artery without angina pectoris: Secondary | ICD-10-CM | POA: Diagnosis not present

## 2021-02-07 DIAGNOSIS — I4891 Unspecified atrial fibrillation: Secondary | ICD-10-CM | POA: Diagnosis not present

## 2021-02-07 DIAGNOSIS — I350 Nonrheumatic aortic (valve) stenosis: Secondary | ICD-10-CM | POA: Diagnosis not present

## 2021-02-07 DIAGNOSIS — D509 Iron deficiency anemia, unspecified: Secondary | ICD-10-CM | POA: Diagnosis not present

## 2021-02-07 DIAGNOSIS — E785 Hyperlipidemia, unspecified: Secondary | ICD-10-CM | POA: Diagnosis not present

## 2021-02-07 DIAGNOSIS — I10 Essential (primary) hypertension: Secondary | ICD-10-CM | POA: Diagnosis not present

## 2021-02-07 DIAGNOSIS — R413 Other amnesia: Secondary | ICD-10-CM | POA: Diagnosis not present

## 2021-02-07 DIAGNOSIS — Z9861 Coronary angioplasty status: Secondary | ICD-10-CM | POA: Diagnosis not present

## 2021-02-08 DIAGNOSIS — R2681 Unsteadiness on feet: Secondary | ICD-10-CM | POA: Diagnosis not present

## 2021-02-08 DIAGNOSIS — Z9181 History of falling: Secondary | ICD-10-CM | POA: Diagnosis not present

## 2021-02-08 DIAGNOSIS — R41841 Cognitive communication deficit: Secondary | ICD-10-CM | POA: Diagnosis not present

## 2021-02-08 DIAGNOSIS — M6281 Muscle weakness (generalized): Secondary | ICD-10-CM | POA: Diagnosis not present

## 2021-02-10 DIAGNOSIS — R41841 Cognitive communication deficit: Secondary | ICD-10-CM | POA: Diagnosis not present

## 2021-02-10 DIAGNOSIS — Z9181 History of falling: Secondary | ICD-10-CM | POA: Diagnosis not present

## 2021-02-10 DIAGNOSIS — R2681 Unsteadiness on feet: Secondary | ICD-10-CM | POA: Diagnosis not present

## 2021-02-10 DIAGNOSIS — M6281 Muscle weakness (generalized): Secondary | ICD-10-CM | POA: Diagnosis not present

## 2021-02-14 ENCOUNTER — Other Ambulatory Visit: Payer: Self-pay | Admitting: Cardiology

## 2021-02-16 DIAGNOSIS — Z9181 History of falling: Secondary | ICD-10-CM | POA: Diagnosis not present

## 2021-02-16 DIAGNOSIS — R41841 Cognitive communication deficit: Secondary | ICD-10-CM | POA: Diagnosis not present

## 2021-02-16 DIAGNOSIS — R2681 Unsteadiness on feet: Secondary | ICD-10-CM | POA: Diagnosis not present

## 2021-02-16 DIAGNOSIS — M6281 Muscle weakness (generalized): Secondary | ICD-10-CM | POA: Diagnosis not present

## 2021-02-18 DIAGNOSIS — M6281 Muscle weakness (generalized): Secondary | ICD-10-CM | POA: Diagnosis not present

## 2021-02-18 DIAGNOSIS — R41841 Cognitive communication deficit: Secondary | ICD-10-CM | POA: Diagnosis not present

## 2021-02-18 DIAGNOSIS — Z9181 History of falling: Secondary | ICD-10-CM | POA: Diagnosis not present

## 2021-02-18 DIAGNOSIS — R2681 Unsteadiness on feet: Secondary | ICD-10-CM | POA: Diagnosis not present

## 2021-02-22 DIAGNOSIS — R41841 Cognitive communication deficit: Secondary | ICD-10-CM | POA: Diagnosis not present

## 2021-02-28 ENCOUNTER — Ambulatory Visit: Payer: Medicare Other | Admitting: Cardiology

## 2021-03-02 ENCOUNTER — Other Ambulatory Visit: Payer: Self-pay | Admitting: Cardiology

## 2021-03-10 DIAGNOSIS — R41841 Cognitive communication deficit: Secondary | ICD-10-CM | POA: Diagnosis not present

## 2021-04-23 ENCOUNTER — Emergency Department (HOSPITAL_BASED_OUTPATIENT_CLINIC_OR_DEPARTMENT_OTHER)
Admission: EM | Admit: 2021-04-23 | Discharge: 2021-04-23 | Disposition: A | Discharge status: Home / Self Care | Payer: Medicare Other | Attending: Emergency Medicine | Admitting: Emergency Medicine

## 2021-04-23 ENCOUNTER — Encounter (HOSPITAL_BASED_OUTPATIENT_CLINIC_OR_DEPARTMENT_OTHER): Payer: Self-pay

## 2021-04-23 ENCOUNTER — Emergency Department (HOSPITAL_BASED_OUTPATIENT_CLINIC_OR_DEPARTMENT_OTHER): Payer: Medicare Other | Admitting: Radiology

## 2021-04-23 ENCOUNTER — Other Ambulatory Visit: Payer: Self-pay

## 2021-04-23 DIAGNOSIS — W010XXA Fall on same level from slipping, tripping and stumbling without subsequent striking against object, initial encounter: Secondary | ICD-10-CM | POA: Insufficient documentation

## 2021-04-23 DIAGNOSIS — S299XXA Unspecified injury of thorax, initial encounter: Secondary | ICD-10-CM | POA: Diagnosis present

## 2021-04-23 DIAGNOSIS — Z043 Encounter for examination and observation following other accident: Secondary | ICD-10-CM | POA: Diagnosis not present

## 2021-04-23 DIAGNOSIS — Z87891 Personal history of nicotine dependence: Secondary | ICD-10-CM | POA: Insufficient documentation

## 2021-04-23 DIAGNOSIS — S2242XA Multiple fractures of ribs, left side, initial encounter for closed fracture: Secondary | ICD-10-CM | POA: Insufficient documentation

## 2021-04-23 DIAGNOSIS — S7002XA Contusion of left hip, initial encounter: Secondary | ICD-10-CM | POA: Diagnosis not present

## 2021-04-23 DIAGNOSIS — I2510 Atherosclerotic heart disease of native coronary artery without angina pectoris: Secondary | ICD-10-CM | POA: Insufficient documentation

## 2021-04-23 DIAGNOSIS — I10 Essential (primary) hypertension: Secondary | ICD-10-CM | POA: Diagnosis not present

## 2021-04-23 DIAGNOSIS — W19XXXA Unspecified fall, initial encounter: Secondary | ICD-10-CM

## 2021-04-23 DIAGNOSIS — Z7982 Long term (current) use of aspirin: Secondary | ICD-10-CM | POA: Diagnosis not present

## 2021-04-23 DIAGNOSIS — Z79899 Other long term (current) drug therapy: Secondary | ICD-10-CM | POA: Diagnosis not present

## 2021-04-23 DIAGNOSIS — M85852 Other specified disorders of bone density and structure, left thigh: Secondary | ICD-10-CM | POA: Diagnosis not present

## 2021-04-23 MED ORDER — OXYCODONE-ACETAMINOPHEN 5-325 MG PO TABS: 0.5000 | ORAL_TABLET | Freq: Three times a day (TID) | ORAL | 0 refills | Status: DC | PRN

## 2021-04-23 NOTE — ED Notes (Signed)
She receives I.S. device and is educated in its use.

## 2021-04-23 NOTE — ED Provider Notes (Signed)
[facility]ERGENCY DEPT Provider Note   CSN: [id number] Arrival date & time: 04/23/21  1328     History Chief Complaint  Patient presents with  . Fall    [name] is a 87 y.o. female.  HPI Patient presents with left chest pain and left hip pain after fall.  Fell either Thursday or Friday last week with today being Saturday.  Sounds as if it was mechanical fall.  No loss consciousness.  Did not hit head.  Not on anticoagulation.  Left chest pain is worse with movements.  Worse with a deep breath.  Has been amatory but some pain on left hip.    Past Medical History:  Diagnosis Date  . Anxiety and depression   . Aortic stenosis    a. 03/2014: Mild aortic stenosis by valve area and mean gradient though appeared more visually consistent with moderate AS.   [name] Kitchen CAD (coronary artery disease)    a. s/p prior PCI of D2 in 1992 b. stent to RCA in 2005  . Diverticula of colon   . GERD (gastroesophageal reflux disease)   . HTN (hypertension)   . Hypercholesteremia   . Lateral myocardial infarction ([place]) 1992  . Osteoarthritis   . Osteopenia   . Scoliosis     Patient Active Problem List   Diagnosis Date Noted  . Elevated troponin   . Symptomatic anemia 09/20/2020  . Atrial fibrillation with RVR ([place]) 09/20/2020  . Dizzy 06/05/2013  . Aortic stenosis, mild   . CAD (coronary artery disease)   . HTN (hypertension)   . Hypercholesteremia   . Anxiety and depression   . Lateral myocardial infarction ([place])   . Scoliosis   . GERD 06/09/2009  . PERSONAL HISTORY OF COLONIC POLYPS 06/09/2009    Past Surgical History:  Procedure Laterality Date  . ABDOMINAL HYSTERECTOMY    . APPENDECTOMY    . CORONARY ANGIOPLASTY    . TONSILLECTOMY       OB History   No obstetric history on file.     Family History  Problem Relation Age of Onset  . Hypertension Father   . Heart failure Mother   . Hypertension Mother   . Cancer Brother     Social History    Tobacco Use  . Smoking status: Former Smoker    Types: Cigarettes    Quit date: 12/26/1991    Years since quitting: 29.3  . Smokeless tobacco: Never Used  Vaping Use  . Vaping Use: Never used  Substance Use Topics  . Alcohol use: No    Alcohol/week: 0.0 standard drinks  . Drug use: Yes    Types: Methylphenidate    Home Medications Prior to Admission medications   Medication Sig Start Date End Date Taking? Authorizing Provider  oxyCODONE-acetaminophen (PERCOCET/ROXICET) 5-325 MG tablet Take 0.5-1 tablets by mouth every 8 (eight) hours as needed for severe pain. 04/23/21  Yes Davonna Belling, MD  amLODipine (NORVASC) 10 MG tablet Take 1 tablet (10 mg total) by mouth daily. 09/23/20   Amin, Jeanella Flattery, MD  aspirin EC 81 MG tablet Take 1 tablet (81 mg total) by mouth daily. Swallow whole. 09/29/20   [name] E, PA-C  atorvastatin (LIPITOR) 40 MG tablet TAKE 1 TABLET DAILY 07/07/20   Martinique, Peter M, MD  cholecalciferol (VITAMIN D) 1000 UNITS tablet Take 1,000 Units by mouth 2 (two) times daily.    [provider]  ferrous sulfate 325 (65 FE) MG tablet Take 1 tablet (325  mg total) by mouth daily. 09/22/20 09/22/21  Damita Lack, MD  hydrochlorothiazide (HYDRODIURIL) 25 MG tablet TAKE 1 TABLET DAILY 02/14/21   Martinique, Peter M, MD  losartan (COZAAR) 100 MG tablet Take 1 tablet (100 mg total) by mouth daily. 09/17/20 09/12/21  Martinique, Peter M, MD  Melatonin 1 MG CAPS Take 1 capsule by mouth every evening.     [provider]  metoprolol succinate (TOPROL-XL) 50 MG 24 hr tablet TAKE 1 TABLET EVERY DAY    WITH OR RIGHT AFTER A MEAL 03/02/21   Martinique, Peter M, MD  nitroGLYCERIN (NITROSTAT) 0.4 MG SL tablet Place 1 tablet (0.4 mg total) under the tongue every 5 (five) minutes as needed for chest pain. 09/23/20   Martinique, Peter M, MD    Allergies    Nifedipine, Tetanus toxoids, and Adhesive [tape]  Review of Systems   Review of Systems  Constitutional: Negative for  appetite change.  HENT: Negative for congestion.   Respiratory: Negative for shortness of breath.   Cardiovascular: Positive for chest pain. Negative for leg swelling.  Gastrointestinal: Negative for abdominal pain.  Genitourinary: Negative for flank pain.  Musculoskeletal: Negative for back pain.  Skin: Negative for rash.  Neurological: Negative for weakness.  Psychiatric/Behavioral: Negative for confusion.    Physical Exam Updated Vital Signs BP (!) 201/88 (BP Location: Right Arm)   Pulse (!) 51   Temp 97.7 F (36.5 C) (Oral)   Resp 16   SpO2 97%   Physical Exam Vitals and nursing note reviewed.  HENT:     Head: Atraumatic.     Mouth/Throat:     Mouth: Mucous membranes are moist.  Eyes:     Pupils: Pupils are equal, round, and reactive to light.  Cardiovascular:     Rate and Rhythm: Normal rate and regular rhythm.  Pulmonary:     Comments: Tenderness to left lateral and anterior lower chest wall.  No crepitance or deformity Chest:     Chest wall: Tenderness present.  Abdominal:     Tenderness: There is no abdominal tenderness.  Musculoskeletal:        General: Tenderness present.     Cervical back: Neck supple.     Comments: Tenderness to left hip laterally.  Good range of motion.  No deformity.  Skin:    General: Skin is warm.     Capillary Refill: Capillary refill takes less than 2 seconds.  Neurological:     Mental Status: She is alert and oriented to person, place, and time.     ED Results / Procedures / Treatments   Labs (all labs ordered are listed, but only abnormal results are displayed) Labs Reviewed - No data to display  EKG None  Radiology DG Ribs Unilateral W/Chest Left  Result Date: 04/23/2021 CLINICAL DATA:  Fall EXAM: LEFT RIBS AND CHEST - 3+ VIEW COMPARISON:  09/20/2020 FINDINGS: Acute fractures of the lateral third-fifth left ribs. Slight displacement is present. No pneumothorax. No pleural effusion. Chronic pulmonary interstitial  changes. Similar cardiomediastinal contours. IMPRESSION: Acute fractures of the lateral 3rd-5th left ribs with slight displacement. Electronically Signed   By: Macy Mis M.D.   On: 04/23/2021 16:29   DG Hip Unilat W or Wo Pelvis 2-3 Views Left  Result Date: 04/23/2021 CLINICAL DATA:  Fall 8 days ago. EXAM: DG HIP (WITH OR WITHOUT PELVIS) 2-3V LEFT COMPARISON:  None. FINDINGS: Single-view of the pelvis and two views of the LEFT hip are provided. Osseous alignment is normal. No  fracture line or displaced fracture fragment is seen. Diffuse osteopenia. Mild degenerative change at the bilateral hip joints. Advanced degenerative change within the scoliotic lower lumbar spine, incompletely imaged. Soft tissues about the pelvis and LEFT hip are unremarkable. IMPRESSION: 1. No acute findings. No osseous fracture or dislocation. 2. Diffuse osteopenia. Electronically Signed   By: Franki Cabot M.D.   On: 04/23/2021 16:28    Procedures Procedures   Medications Ordered in ED Medications - No data to display  ED Course  I have reviewed the triage vital signs and the nursing notes.  Pertinent labs & imaging results that were available during my care of the patient were reviewed by me and considered in my medical decision making (see chart for details).    MDM Rules/Calculators/A&P                         Patient presents after fall.  Fall was about a week ago.  Left rib pain.  Found to have 3 rib fractures.  Doubt intra-abdominal injury.  No abdominal tenderness.  Also tenderness to left hip laterally.  Negative x-ray.  Do not feel as if any CT scanning of hip or chest at this time.  Discussed with patient daughter.  Aware of high mortality with fractures but do not think she needs admission to the hospital at this time.  Hypertension may be related pain at this time but needs following.  Will discharge home. Final Clinical Impression(s) / ED Diagnoses Final diagnoses:  Closed fracture of multiple ribs  of left side, initial encounter  Fall, initial encounter  Contusion of left hip, initial encounter    Rx / DC Orders ED Discharge Orders         Ordered    oxyCODONE-acetaminophen (PERCOCET/ROXICET) 5-325 MG tablet  Every 8 hours PRN        04/23/21 1735           Davonna Belling, MD 04/23/21 1736

## 2021-04-23 NOTE — ED Triage Notes (Signed)
She states she tripped (didn't pass out) a few days ago, landing on her left side. Today she c/o left lat. Ribs area pain and some more mild pain of left hip and leg. She continues to be ambulatory at her baseline.

## 2021-05-02 ENCOUNTER — Encounter (HOSPITAL_COMMUNITY): Payer: Self-pay | Admitting: *Deleted

## 2021-05-02 ENCOUNTER — Emergency Department (HOSPITAL_COMMUNITY): Payer: Medicare Other

## 2021-05-02 ENCOUNTER — Emergency Department (HOSPITAL_COMMUNITY)
Admission: EM | Admit: 2021-05-02 | Discharge: 2021-05-02 | Disposition: A | Discharge status: Home / Self Care | Payer: Medicare Other | Attending: Emergency Medicine | Admitting: Emergency Medicine

## 2021-05-02 DIAGNOSIS — X58XXXA Exposure to other specified factors, initial encounter: Secondary | ICD-10-CM | POA: Insufficient documentation

## 2021-05-02 DIAGNOSIS — W19XXXA Unspecified fall, initial encounter: Secondary | ICD-10-CM

## 2021-05-02 DIAGNOSIS — Z7982 Long term (current) use of aspirin: Secondary | ICD-10-CM | POA: Diagnosis not present

## 2021-05-02 DIAGNOSIS — R4182 Altered mental status, unspecified: Secondary | ICD-10-CM | POA: Diagnosis not present

## 2021-05-02 DIAGNOSIS — I2510 Atherosclerotic heart disease of native coronary artery without angina pectoris: Secondary | ICD-10-CM | POA: Diagnosis not present

## 2021-05-02 DIAGNOSIS — R410 Disorientation, unspecified: Secondary | ICD-10-CM | POA: Diagnosis not present

## 2021-05-02 DIAGNOSIS — Z87891 Personal history of nicotine dependence: Secondary | ICD-10-CM | POA: Diagnosis not present

## 2021-05-02 DIAGNOSIS — Z20822 Contact with and (suspected) exposure to covid-19: Secondary | ICD-10-CM | POA: Insufficient documentation

## 2021-05-02 DIAGNOSIS — S51011A Laceration without foreign body of right elbow, initial encounter: Secondary | ICD-10-CM | POA: Insufficient documentation

## 2021-05-02 DIAGNOSIS — I10 Essential (primary) hypertension: Secondary | ICD-10-CM | POA: Insufficient documentation

## 2021-05-02 DIAGNOSIS — Z79899 Other long term (current) drug therapy: Secondary | ICD-10-CM | POA: Diagnosis not present

## 2021-05-02 DIAGNOSIS — Z955 Presence of coronary angioplasty implant and graft: Secondary | ICD-10-CM | POA: Insufficient documentation

## 2021-05-02 DIAGNOSIS — R079 Chest pain, unspecified: Secondary | ICD-10-CM

## 2021-05-02 DIAGNOSIS — I281 Aneurysm of pulmonary artery: Secondary | ICD-10-CM | POA: Diagnosis not present

## 2021-05-02 DIAGNOSIS — K449 Diaphragmatic hernia without obstruction or gangrene: Secondary | ICD-10-CM | POA: Diagnosis not present

## 2021-05-02 DIAGNOSIS — F29 Unspecified psychosis not due to a substance or known physiological condition: Secondary | ICD-10-CM | POA: Diagnosis not present

## 2021-05-02 DIAGNOSIS — I517 Cardiomegaly: Secondary | ICD-10-CM | POA: Diagnosis not present

## 2021-05-02 DIAGNOSIS — S59901A Unspecified injury of right elbow, initial encounter: Secondary | ICD-10-CM | POA: Diagnosis present

## 2021-05-02 DIAGNOSIS — F0390 Unspecified dementia without behavioral disturbance: Secondary | ICD-10-CM | POA: Diagnosis not present

## 2021-05-02 DIAGNOSIS — J479 Bronchiectasis, uncomplicated: Secondary | ICD-10-CM | POA: Diagnosis not present

## 2021-05-02 LAB — URINALYSIS, ROUTINE W REFLEX MICROSCOPIC
Bilirubin Urine: NEGATIVE
Glucose, UA: NEGATIVE mg/dL
Hgb urine dipstick: NEGATIVE
Ketones, ur: 20 mg/dL — AB
Nitrite: NEGATIVE
Protein, ur: NEGATIVE mg/dL
Specific Gravity, Urine: 1.011 (ref 1.005–1.030)
WBC, UA: >50 WBC/hpf — ABNORMAL HIGH (ref 0–5)
pH: 7 (ref 5.0–8.0)

## 2021-05-02 LAB — COMPREHENSIVE METABOLIC PANEL
ALT: 18 U/L (ref 0–44)
AST: 30 U/L (ref 15–41)
Albumin: 4 g/dL (ref 3.5–5.0)
Alkaline Phosphatase: 101 U/L (ref 38–126)
Anion gap: 9 (ref 5–15)
BUN: 15 mg/dL (ref 8–23)
CO2: 27 mmol/L (ref 22–32)
Calcium: 8.9 mg/dL (ref 8.9–10.3)
Chloride: 104 mmol/L (ref 98–111)
Creatinine, Ser: 0.59 mg/dL (ref 0.44–1.00)
GFR, Estimated: >60 mL/min (ref >60)
Glucose, Bld: 93 mg/dL (ref 70–99)
Potassium: 3.2 mmol/L — ABNORMAL LOW (ref 3.5–5.1)
Sodium: 140 mmol/L (ref 135–145)
Total Bilirubin: 0.7 mg/dL (ref 0.3–1.2)
Total Protein: 7.6 g/dL (ref 6.5–8.1)

## 2021-05-02 LAB — CBC WITH DIFFERENTIAL/PLATELET
Abs Immature Granulocytes: 0.02 10*3/uL (ref 0.00–0.07)
Basophils Absolute: 0 10*3/uL (ref 0.0–0.1)
Basophils Relative: 0 %
Eosinophils Absolute: 0.1 10*3/uL (ref 0.0–0.5)
Eosinophils Relative: 1 %
HCT: 44.9 % (ref 36.0–46.0)
Hemoglobin: 13.9 g/dL (ref 12.0–15.0)
Immature Granulocytes: 0 %
Lymphocytes Relative: 11 %
Lymphs Abs: 0.8 10*3/uL (ref 0.7–4.0)
MCH: 28.5 pg (ref 26.0–34.0)
MCHC: 31 g/dL (ref 30.0–36.0)
MCV: 92.2 fL (ref 80.0–100.0)
Monocytes Absolute: 0.8 10*3/uL (ref 0.1–1.0)
Monocytes Relative: 11 %
Neutro Abs: 5.6 10*3/uL (ref 1.7–7.7)
Neutrophils Relative %: 77 %
Platelets: 235 10*3/uL (ref 150–400)
RBC: 4.87 MIL/uL (ref 3.87–5.11)
RDW: 15.1 % (ref 11.5–15.5)
WBC: 7.2 10*3/uL (ref 4.0–10.5)
nRBC: 0 % (ref 0.0–0.2)

## 2021-05-02 LAB — RESP PANEL BY RT-PCR (FLU A&B, COVID) ARPGX2
Influenza A by PCR: NEGATIVE
Influenza B by PCR: NEGATIVE
SARS Coronavirus 2 by RT PCR: NEGATIVE

## 2021-05-02 LAB — LACTIC ACID, PLASMA: Lactic Acid, Venous: 1.2 mmol/L (ref 0.5–1.9)

## 2021-05-02 LAB — TROPONIN I (HIGH SENSITIVITY)
Troponin I (High Sensitivity): 13 ng/L (ref <18)
Troponin I (High Sensitivity): 12 ng/L (ref <18)

## 2021-05-02 LAB — CBG MONITORING, ED: Glucose-Capillary: 89 mg/dL (ref 70–99)

## 2021-05-02 LAB — PROTIME-INR
INR: 1 (ref 0.8–1.2)
Prothrombin Time: 12.6 seconds (ref 11.4–15.2)

## 2021-05-02 MED ORDER — AMLODIPINE BESYLATE 5 MG PO TABS
10.0000 mg | ORAL_TABLET | Freq: Every day | ORAL | Status: DC
  Administered 2021-05-02: 10 mg via ORAL
  Filled 2021-05-02: qty 2

## 2021-05-02 MED ORDER — FERROUS SULFATE 325 (65 FE) MG PO TABS
325.0000 mg | ORAL_TABLET | Freq: Every day | ORAL | Status: DC
  Administered 2021-05-02: 325 mg via ORAL
  Filled 2021-05-02: qty 1

## 2021-05-02 MED ORDER — ATORVASTATIN CALCIUM 40 MG PO TABS
40.0000 mg | ORAL_TABLET | Freq: Every day | ORAL | Status: DC
  Administered 2021-05-02: 40 mg via ORAL
  Filled 2021-05-02: qty 1

## 2021-05-02 MED ORDER — HYDROCHLOROTHIAZIDE 25 MG PO TABS
25.0000 mg | ORAL_TABLET | Freq: Every day | ORAL | Status: DC
  Administered 2021-05-02: 25 mg via ORAL
  Filled 2021-05-02: qty 1

## 2021-05-02 MED ORDER — LOSARTAN POTASSIUM 25 MG PO TABS
100.0000 mg | ORAL_TABLET | Freq: Every day | ORAL | Status: DC
  Administered 2021-05-02: 100 mg via ORAL
  Filled 2021-05-02: qty 4

## 2021-05-02 MED ORDER — VITAMIN D 25 MCG (1000 UNIT) PO TABS
1000.0000 [IU] | ORAL_TABLET | Freq: Two times a day (BID) | ORAL | Status: DC
  Administered 2021-05-02: 1000 [IU] via ORAL
  Filled 2021-05-02: qty 1

## 2021-05-02 NOTE — ED Notes (Signed)
Waiting on family to pick pt up.  

## 2021-05-02 NOTE — Discharge Instructions (Signed)
Facility is going to try to get her moved tomorrow

## 2021-05-02 NOTE — ED Triage Notes (Signed)
Per EMS, pt from friend's home [place] here for altered mental status. She called security last night, confused and not wearing pants. She was incontinent this morning, which she says is not like her. She is alert to person and place, not sure of year.

## 2021-05-02 NOTE — ED Provider Notes (Signed)
[facility] DEPT Provider Note   CSN: [id number] Arrival date & time: 05/02/21  0941     History Chief Complaint  Patient presents with  . Altered Mental Status    [name] is a 87 y.o. female.  87 year old female with prior medical history as detailed below presents for evaluation.  Patient is unable to provide significant history given her dementia.  Level 5 caveat secondary to same.  Patient reports that she did not "feel good this morning."  Patient is currently a resident at [facility] in the independent living section.  Patient was to transition over to assisted living at the same facility later this week.  Patient with a skin tear to the right elbow. This appears new.   Additional history obtained from the patient's sister.  Patient's ability to function independently has significantly decreased over the last several weeks.  Patient was already scheduled for transition to assisted living later this week.  Patient with suspected fall overnight.  Patient's sister reports that she is not safe to be discharged back into an independent living situation.  The history is provided by the patient and medical records.  Altered Mental Status Presenting symptoms: disorientation   Severity:  Mild Most recent episode:  Today Episode history:  Unable to specify Timing:  Unable to specify      Past Medical History:  Diagnosis Date  . Anxiety and depression   . Aortic stenosis    a. 03/2014: Mild aortic stenosis by valve area and mean gradient though appeared more visually consistent with moderate AS.   [name] Kitchen CAD (coronary artery disease)    a. s/p prior PCI of D2 in 1992 b. stent to RCA in 2005  . Diverticula of colon   . GERD (gastroesophageal reflux disease)   . HTN (hypertension)   . Hypercholesteremia   . Lateral myocardial infarction ([place]) 1992  . Osteoarthritis   . Osteopenia   . Scoliosis     Patient Active Problem List    Diagnosis Date Noted  . Elevated troponin   . Symptomatic anemia 09/20/2020  . Atrial fibrillation with RVR ([place]) 09/20/2020  . Dizzy 06/05/2013  . Aortic stenosis, mild   . CAD (coronary artery disease)   . HTN (hypertension)   . Hypercholesteremia   . Anxiety and depression   . Lateral myocardial infarction ([place])   . Scoliosis   . GERD 06/09/2009  . PERSONAL HISTORY OF COLONIC POLYPS 06/09/2009    Past Surgical History:  Procedure Laterality Date  . ABDOMINAL HYSTERECTOMY    . APPENDECTOMY    . CORONARY ANGIOPLASTY    . TONSILLECTOMY       OB History   No obstetric history on file.     Family History  Problem Relation Age of Onset  . Hypertension Father   . Heart failure Mother   . Hypertension Mother   . Cancer Brother     Social History   Tobacco Use  . Smoking status: Former Smoker    Types: Cigarettes    Quit date: 12/26/1991    Years since quitting: 29.3  . Smokeless tobacco: Never Used  Vaping Use  . Vaping Use: Never used  Substance Use Topics  . Alcohol use: No    Alcohol/week: 0.0 standard drinks  . Drug use: Yes    Types: Methylphenidate    Home Medications Prior to Admission medications   Medication Sig Start Date End Date Taking? Authorizing Provider  amLODipine (NORVASC) 10  MG tablet Take 1 tablet (10 mg total) by mouth daily. 09/23/20   Amin, Jeanella Flattery, MD  aspirin EC 81 MG tablet Take 1 tablet (81 mg total) by mouth daily. Swallow whole. 09/29/20   [name] E, PA-C  atorvastatin (LIPITOR) 40 MG tablet TAKE 1 TABLET DAILY 07/07/20   [place], Markea Ruzich M, MD  cholecalciferol (VITAMIN D) 1000 UNITS tablet Take 1,000 Units by mouth 2 (two) times daily.    [provider]  ferrous sulfate 325 (65 FE) MG tablet Take 1 tablet (325 mg total) by mouth daily. 09/22/20 09/22/21  Damita Lack, MD  hydrochlorothiazide (HYDRODIURIL) 25 MG tablet TAKE 1 TABLET DAILY 02/14/21   [place], Bunny Lowdermilk M, MD  losartan (COZAAR) 100 MG tablet Take 1  tablet (100 mg total) by mouth daily. 09/17/20 09/12/21  [place], Nitesh Pitstick M, MD  Melatonin 1 MG CAPS Take 1 capsule by mouth every evening.     [provider]  metoprolol succinate (TOPROL-XL) 50 MG 24 hr tablet TAKE 1 TABLET EVERY DAY    WITH OR RIGHT AFTER A MEAL 03/02/21   Martin[place], Nazaret Chea M, MD  nitroGLYCERIN (NITROSTAT) 0.4 MG SL tablet Place 1 tablet (0.4 mg total) under the tongue every 5 (five) minutes as needed for chest pain. 09/23/20   [place], Riki Berninger M, MD  oxyCODONE-acetaminophen (PERCOCET/ROXICET) 5-325 MG tablet Take 0.5-1 tablets by mouth every 8 (eight) hours as needed for severe pain. 04/23/21   Davonna Belling, MD    Allergies    Nifedipine, Tetanus toxoids, and Adhesive [tape]  Review of Systems   Review of Systems  All other systems reviewed and are negative.   Physical Exam Updated Vital Signs BP (!) 208/82 Comment: EDP notified.  Pulse (!) 57 Comment: EDP notified.  Temp (!) 97.5 F (36.4 C) (Oral)   Resp 14 Comment: EDP notified.  Ht 5\' 5"  (1.651 m)   Wt 56.7 kg   SpO2 97% Comment: EDP notified.  BMI 20.80 kg/m   Physical Exam Vitals and nursing note reviewed.  Constitutional:      General: She is not in acute distress.    Appearance: Normal appearance. She is well-developed.  HENT:     Head: Normocephalic and atraumatic.  Eyes:     Conjunctiva/sclera: Conjunctivae normal.     Pupils: Pupils are equal, round, and reactive to light.  Cardiovascular:     Rate and Rhythm: Normal rate and regular rhythm.     Heart sounds: Normal heart sounds.  Pulmonary:     Effort: Pulmonary effort is normal. No respiratory distress.     Breath sounds: Normal breath sounds.  Abdominal:     General: There is no distension.     Palpations: Abdomen is soft.     Tenderness: There is no abdominal tenderness.  Musculoskeletal:        General: No deformity. Normal range of motion.     Cervical back: Normal range of motion and neck supple.  Skin:    General: Skin is warm  and dry.  Neurological:     Mental Status: She is alert. Mental status is at baseline.     ED Results / Procedures / Treatments   Labs (all labs ordered are listed, but only abnormal results are displayed) Labs Reviewed  COMPREHENSIVE METABOLIC PANEL - Abnormal; Notable for the following components:      Result Value   Potassium 3.2 (*)    All other components within normal limits  RESP PANEL BY RT-PCR (FLU A&B,  COVID) ARPGX2  CBC WITH DIFFERENTIAL/PLATELET  PROTIME-INR  LACTIC ACID, PLASMA  URINALYSIS, ROUTINE W REFLEX MICROSCOPIC  LACTIC ACID, PLASMA  CBG MONITORING, ED  TROPONIN I (HIGH SENSITIVITY)  TROPONIN I (HIGH SENSITIVITY)    EKG EKG Interpretation  Date/Time:  Monday May 02 2021 10:31:43 EDT Ventricular Rate:  56 PR Interval:  185 QRS Duration: 133 QT Interval:  468 QTC Calculation: 452 R Axis:   6 Text Interpretation: Sinus rhythm Right bundle branch block Confirmed by Dene Gentry [phone]) on 05/02/2021 11:23:32 AM   Radiology DG Chest 1 View  Result Date: 05/02/2021 CLINICAL DATA:  87 year old female with altered mental status. EXAM: CHEST  1 VIEW COMPARISON:  Chest radiographs 04/23/2021 and earlier. FINDINGS: Stable cardiomegaly and mediastinal contours. But there is an abnormal double density behind the left heart border measuring about 6 cm of unclear etiology and significance. Improved lung volumes compared to last month. Allowing for portable technique the lungs are clear. Visualized tracheal air column is within normal limits. No pneumothorax. No pleural effusion. Left posterolateral rib fractures again noted (5th, 6th ribs). No acute osseous abnormality identified. Paucity of bowel gas in the upper abdomen. IMPRESSION: 1. Abnormal mediastinal density the hind the left heart border is of unclear etiology and significance. This is new since 2006 and not clearly visible last year. Recommend Chest CT to further characterize. 2. No other acute cardiopulmonary  abnormality. Electronically Signed   By: Genevie Ann M.D.   On: 05/02/2021 11:14   DG Elbow 2 Views Right  Result Date: 05/02/2021 CLINICAL DATA:  87 year old female with altered mental status. Laceration on the lateral epicondyle. EXAM: RIGHT ELBOW - 2 VIEW COMPARISON:  None. FINDINGS: No evidence of right elbow joint effusion. Joint spaces and alignment are maintained. Radial head appears intact. No acute osseous abnormality identified. No discrete soft tissue injury identified. No soft tissue gas. IMPRESSION: Negative. Electronically Signed   By: Genevie Ann M.D.   On: 05/02/2021 11:15   CT Head Wo Contrast  Result Date: 05/02/2021 CLINICAL DATA:  Altered mental status EXAM: CT HEAD WITHOUT CONTRAST TECHNIQUE: Contiguous axial images were obtained from the base of the skull through the vertex without intravenous contrast. COMPARISON:  None. FINDINGS: Brain: There is atrophy and chronic small vessel disease changes. No acute intracranial abnormality. Specifically, no hemorrhage, hydrocephalus, mass lesion, acute infarction, or significant intracranial injury. Vascular: No hyperdense vessel or unexpected calcification. Skull: No acute calvarial abnormality. Sinuses/Orbits: Visualized paranasal sinuses and mastoids clear. Orbital soft tissues unremarkable. Other: None IMPRESSION: Atrophy, chronic microvascular disease. No acute intracranial abnormality. Electronically Signed   By: Rolm Baptise M.D.   On: 05/02/2021 11:00   CT Chest Wo Contrast  Result Date: 05/02/2021 CLINICAL DATA:  Abnormal chest radiograph with left retrocardiac density. EXAM: CT CHEST WITHOUT CONTRAST TECHNIQUE: Multidetector CT imaging of the chest was performed following the standard protocol without IV contrast. COMPARISON:  Chest radiograph from earlier today. 03/20/2005 PET-CT. FINDINGS: Cardiovascular: Mild cardiomegaly. Trace pericardial effusion/thickening. Three-vessel coronary atherosclerosis. Atherosclerotic nonaneurysmal thoracic  aorta. Dilated main pulmonary artery (3.6 cm diameter). Mediastinum/Nodes: No discrete thyroid nodules. Unremarkable esophagus. No pathologically enlarged axillary, mediastinal or hilar lymph nodes, noting limited sensitivity for the detection of hilar adenopathy on this noncontrast study. Lungs/Pleura: No pneumothorax. No pleural effusion. No acute consolidative airspace disease or lung masses. Mild-to-moderate patchy tree-in-bud opacities in the dependent upper lobes and dependent lower lobes bilaterally, with associated mild scattered cylindrical bronchiectasis at the areas of tree-in-bud opacity. No significant pulmonary nodules.  Mild to moderate compressive atelectasis in the medial left lung base due to the hiatal hernia. Upper abdomen: Large hiatal hernia.  Stomach is nondistended. Musculoskeletal: No aggressive appearing focal osseous lesions. Intact sternotomy wires. Mild T3 vertebral compression fracture of uncertain chronicity, chronic appearing. Marked lower thoracic spondylosis. IMPRESSION: 1. Large hiatal hernia, accounting for the chest radiograph finding. 2. Mild-to-moderate patchy tree-in-bud opacities in the dependent lungs bilaterally with associated mild scattered cylindrical bronchiectasis. Findings are most compatible with a nonspecific infectious or inflammatory bronchiolitis such as due to atypical mycobacterial infection (MAI) or recurrent aspiration. 3. Mild cardiomegaly. Three-vessel coronary atherosclerosis. 4. Dilated main pulmonary artery, suggesting pulmonary arterial hypertension. 5. Mild T3 vertebral compression fracture of uncertain chronicity, chronic appearing. 6. Aortic Atherosclerosis (ICD10-I70.0). Electronically Signed   By: Ilona Sorrel M.D.   On: 05/02/2021 13:22    Procedures Procedures   Medications Ordered in ED Medications - No data to display  ED Course  I have reviewed the triage vital signs and the nursing notes.  Pertinent labs & imaging results that were  available during my care of the patient were reviewed by me and considered in my medical decision making (see chart for details).    MDM Rules/Calculators/A&P                          MDM  MSE complete  [name] was evaluated in Emergency Department on 05/02/2021 for the symptoms described in the history of present illness. She was evaluated in the context of the global COVID-19 pandemic, which necessitated consideration that the patient might be at risk for infection with the SARS-CoV-2 virus that causes COVID-19. Institutional protocols and algorithms that pertain to the evaluation of patients at risk for COVID-19 are in a state of rapid change based on information released by regulatory bodies including the CDC and federal and state organizations. These policies and algorithms were followed during the patient's care in the ED.   Patient is presenting for evaluation.  Patient is status post suspected fall and on a monitored setting.  Patient without evidence of significant traumatic injury on exam or work-up.  Patient's case discussed with her sister at bedside.  Her sister does not feel that discharge back to her independent living situation is safe.  Social work consulted for assistance with possible placement into assisted living situation.  Patient was scheduled for transition to [facility]'s assisted living section on Wednesday of this week.  Dr. Maryan Rued aware of pending SW consult / pending dispo.      Final diagnoses:  Fall, initial encounter  Skin tear of right elbow without complication, initial encounter    Rx / DC Orders ED Discharge Orders    None       Valarie Merino, MD 05/04/21 1625

## 2021-05-02 NOTE — ED Provider Notes (Addendum)
Patient's UA is most likely asymptomatic bacteriuria.  Patient's last UA looked similar and grew out staph epidermidis.  Culture was sent but do not feel that patient needs treatment at this time.  Patient's troponin is within normal limits, CBC within normal limits, CMP with mild hypokalemia of 3.2 but is negative and CTs with no acute findings.  Patient remained stable.  Social work has spoken with the facility and they may be able to get her in tomorrow.  Sister does not feel comfortable with her going back to independent living tonight.  5:55 PM Patient's sister is going to stay with her overnight.  Patient will be discharged home.   Blanchie Dessert, MD 05/02/21 Tilden Dome, MD 05/02/21 [phone]

## 2021-05-02 NOTE — Social Work (Addendum)
Update 5:04.  CSW spoke with [facility] @ [phone] who states that there is a possibility that Pt can be moved to assisted living on Tuesday 5/10 should Pt be discharged from hospital. CSW updated Pt's sister, Mardene Celeste, about this conversation.  Pt is remains in process at this time.    CSW called Melanie Crazier at [facility] in an effort to discuss case. Ms. Peyton Najjar was unavailable, left message requesting callback.

## 2021-05-05 DIAGNOSIS — R2681 Unsteadiness on feet: Secondary | ICD-10-CM | POA: Diagnosis not present

## 2021-05-05 DIAGNOSIS — R41841 Cognitive communication deficit: Secondary | ICD-10-CM | POA: Diagnosis not present

## 2021-05-05 DIAGNOSIS — R627 Adult failure to thrive: Secondary | ICD-10-CM | POA: Diagnosis not present

## 2021-05-05 DIAGNOSIS — M6281 Muscle weakness (generalized): Secondary | ICD-10-CM | POA: Diagnosis not present

## 2021-05-05 DIAGNOSIS — D509 Iron deficiency anemia, unspecified: Secondary | ICD-10-CM | POA: Diagnosis not present

## 2021-05-05 DIAGNOSIS — M5416 Radiculopathy, lumbar region: Secondary | ICD-10-CM | POA: Diagnosis not present

## 2021-05-05 DIAGNOSIS — M419 Scoliosis, unspecified: Secondary | ICD-10-CM | POA: Diagnosis not present

## 2021-05-05 DIAGNOSIS — R2689 Other abnormalities of gait and mobility: Secondary | ICD-10-CM | POA: Diagnosis not present

## 2021-05-05 DIAGNOSIS — I4891 Unspecified atrial fibrillation: Secondary | ICD-10-CM | POA: Diagnosis not present

## 2021-05-05 LAB — URINE CULTURE: Culture: >=100000 — AB

## 2021-05-06 ENCOUNTER — Telehealth: Payer: Self-pay | Admitting: Emergency Medicine

## 2021-05-06 DIAGNOSIS — R627 Adult failure to thrive: Secondary | ICD-10-CM | POA: Diagnosis not present

## 2021-05-06 DIAGNOSIS — R41841 Cognitive communication deficit: Secondary | ICD-10-CM | POA: Diagnosis not present

## 2021-05-06 DIAGNOSIS — R2681 Unsteadiness on feet: Secondary | ICD-10-CM | POA: Diagnosis not present

## 2021-05-06 DIAGNOSIS — R2689 Other abnormalities of gait and mobility: Secondary | ICD-10-CM | POA: Diagnosis not present

## 2021-05-06 DIAGNOSIS — M419 Scoliosis, unspecified: Secondary | ICD-10-CM | POA: Diagnosis not present

## 2021-05-06 DIAGNOSIS — M6281 Muscle weakness (generalized): Secondary | ICD-10-CM | POA: Diagnosis not present

## 2021-05-06 NOTE — Telephone Encounter (Signed)
Post ED Visit - Positive Culture Follow-up  Culture report reviewed by antimicrobial stewardship pharmacist: [facility] Team []  Elenor Quinones, Pharm.D. []  Heide Guile, Pharm.D., BCPS AQ-ID []  Parks Neptune, Pharm.D., BCPS []  Alycia Rossetti, Pharm.D., BCPS []  [place], Pharm.D., BCPS, AAHIVP []  Legrand Como, Pharm.D., BCPS, AAHIVP []  Salome Arnt, PharmD, BCPS []  Johnnette Gourd, PharmD, BCPS []  Hughes Better, PharmD, BCPS []  Leeroy Cha, PharmD []  Laqueta Linden, PharmD, BCPS []  Albertina Parr, PharmD  [facility] Team []  Leodis Sias, PharmD []  Lindell Spar, PharmD []  Royetta Asal, PharmD []  Graylin Shiver, Rph []  Rema Fendt) Glennon Mac, PharmD []  Arlyn Dunning, PharmD []  Netta Cedars, PharmD []  Dia Sitter, PharmD []  Leone Haven, PharmD []  Gretta Arab, PharmD []  Theodis Shove, PharmD []  Peggyann Juba, PharmD []  Reuel Boom, PharmD   Positive urine culture No further patient follow-up is required at this time - Dene Gentry, MD  Milus Mallick RN 05/06/2021, 2:02 PM

## 2021-05-06 NOTE — Progress Notes (Signed)
ED Antimicrobial Stewardship Positive Culture Follow Up   [name] is an 87 y.o. female who presented to [facility] on 05/02/2021 with a chief complaint of  Chief Complaint  Patient presents with  . Altered Mental Status    Recent Results (from the past 720 hour(s))  Resp Panel by RT-PCR (Flu A&B, Covid) Nasopharyngeal Swab     Status: None   Collection Time: 05/02/21 10:32 AM   Specimen: Nasopharyngeal Swab; Nasopharyngeal(NP) swabs in vial transport medium  Result Value Ref Range Status   SARS Coronavirus 2 by RT PCR NEGATIVE NEGATIVE Final    Comment: (NOTE) SARS-CoV-2 target nucleic acids are NOT DETECTED.  The SARS-CoV-2 RNA is generally detectable in upper respiratory specimens during the acute phase of infection. The lowest concentration of SARS-CoV-2 viral copies this assay can detect is 138 copies/mL. A negative result does not preclude SARS-Cov-2 infection and should not be used as the sole basis for treatment or other patient management decisions. A negative result may occur with  improper specimen collection/handling, submission of specimen other than nasopharyngeal swab, presence of viral mutation(s) within the areas targeted by this assay, and inadequate number of viral copies(<138 copies/mL). A negative result must be combined with clinical observations, patient history, and epidemiological information. The expected result is Negative.  Fact Sheet for Patients:  EntrepreneurPulse.com.au  Fact Sheet for Healthcare Providers:  IncredibleEmployment.be  This test is no t yet approved or cleared by the [place] FDA and  has been authorized for detection and/or diagnosis of SARS-CoV-2 by FDA under an Emergency Use Authorization (EUA). This EUA will remain  in effect (meaning this test can be used) for the duration of the COVID-19 declaration under Section 564(b)(1) of the Act, 21 U.S.C.section 360bbb-3(b)(1), unless the  authorization is terminated  or revoked sooner.       Influenza A by PCR NEGATIVE NEGATIVE Final   Influenza B by PCR NEGATIVE NEGATIVE Final    Comment: (NOTE) The Xpert Xpress SARS-CoV-2/FLU/RSV plus assay is intended as an aid in the diagnosis of influenza from Nasopharyngeal swab specimens and should not be used as a sole basis for treatment. Nasal washings and aspirates are unacceptable for Xpert Xpress SARS-CoV-2/FLU/RSV testing.  Fact Sheet for Patients: EntrepreneurPulse.com.au  Fact Sheet for Healthcare Providers: IncredibleEmployment.be  This test is not yet approved or cleared by the [place] FDA and has been authorized for detection and/or diagnosis of SARS-CoV-2 by FDA under an Emergency Use Authorization (EUA). This EUA will remain in effect (meaning this test can be used) for the duration of the COVID-19 declaration under Section 564(b)(1) of the Act, 21 U.S.C. section 360bbb-3(b)(1), unless the authorization is terminated or revoked.  Performed at [facility], [place] [address], [place], [place] [address]   Urine Culture     Status: Abnormal   Collection Time: 05/02/21  4:39 PM   Specimen: Urine, Clean Catch  Result Value Ref Range Status   Specimen Description   Final    URINE, CLEAN CATCH Performed at [facility], [place] [address] [place], [place] [address]    Special Requests   Final    NONE Performed at [facility], [place] [address] [place],  [address]    Culture >=100,000 COLONIES/mL STAPHYLOCOCCUS EPIDERMIDIS (A)  Final   Report Status 05/05/2021 FINAL  Final   Organism ID, Bacteria STAPHYLOCOCCUS EPIDERMIDIS (A)  Final      Susceptibility   Staphylococcus epidermidis - MIC*  CIPROFLOXACIN <=0.5 SENSITIVE Sensitive     GENTAMICIN <=0.5 SENSITIVE Sensitive     NITROFURANTOIN <=16 SENSITIVE Sensitive     OXACILLIN <=0.25 SENSITIVE Sensitive      TETRACYCLINE >=16 RESISTANT Resistant     VANCOMYCIN 1 SENSITIVE Sensitive     TRIMETH/SULFA <=10 SENSITIVE Sensitive     CLINDAMYCIN <=0.25 SENSITIVE Sensitive     RIFAMPIN <=0.5 SENSITIVE Sensitive     Inducible Clindamycin NEGATIVE Sensitive     * >=100,000 COLONIES/mL STAPHYLOCOCCUS EPIDERMIDIS   Plan Asymptomatic bacteruria, no treatment necessary  ED Provider: Valarie Merino, MD   [name] 05/06/2021, 9:08 AM Student Pharmacist

## 2021-05-07 DIAGNOSIS — R2681 Unsteadiness on feet: Secondary | ICD-10-CM | POA: Diagnosis not present

## 2021-05-07 DIAGNOSIS — R41841 Cognitive communication deficit: Secondary | ICD-10-CM | POA: Diagnosis not present

## 2021-05-07 DIAGNOSIS — R2689 Other abnormalities of gait and mobility: Secondary | ICD-10-CM | POA: Diagnosis not present

## 2021-05-07 DIAGNOSIS — M419 Scoliosis, unspecified: Secondary | ICD-10-CM | POA: Diagnosis not present

## 2021-05-07 DIAGNOSIS — M6281 Muscle weakness (generalized): Secondary | ICD-10-CM | POA: Diagnosis not present

## 2021-05-07 DIAGNOSIS — R627 Adult failure to thrive: Secondary | ICD-10-CM | POA: Diagnosis not present

## 2021-05-09 DIAGNOSIS — R2681 Unsteadiness on feet: Secondary | ICD-10-CM | POA: Diagnosis not present

## 2021-05-09 DIAGNOSIS — M419 Scoliosis, unspecified: Secondary | ICD-10-CM | POA: Diagnosis not present

## 2021-05-09 DIAGNOSIS — M6281 Muscle weakness (generalized): Secondary | ICD-10-CM | POA: Diagnosis not present

## 2021-05-09 DIAGNOSIS — R627 Adult failure to thrive: Secondary | ICD-10-CM | POA: Diagnosis not present

## 2021-05-09 DIAGNOSIS — R2689 Other abnormalities of gait and mobility: Secondary | ICD-10-CM | POA: Diagnosis not present

## 2021-05-09 DIAGNOSIS — R41841 Cognitive communication deficit: Secondary | ICD-10-CM | POA: Diagnosis not present

## 2021-05-10 DIAGNOSIS — R41841 Cognitive communication deficit: Secondary | ICD-10-CM | POA: Diagnosis not present

## 2021-05-10 DIAGNOSIS — R2689 Other abnormalities of gait and mobility: Secondary | ICD-10-CM | POA: Diagnosis not present

## 2021-05-10 DIAGNOSIS — R2681 Unsteadiness on feet: Secondary | ICD-10-CM | POA: Diagnosis not present

## 2021-05-10 DIAGNOSIS — R627 Adult failure to thrive: Secondary | ICD-10-CM | POA: Diagnosis not present

## 2021-05-10 DIAGNOSIS — M419 Scoliosis, unspecified: Secondary | ICD-10-CM | POA: Diagnosis not present

## 2021-05-10 DIAGNOSIS — M6281 Muscle weakness (generalized): Secondary | ICD-10-CM | POA: Diagnosis not present

## 2021-05-11 DIAGNOSIS — M6281 Muscle weakness (generalized): Secondary | ICD-10-CM | POA: Diagnosis not present

## 2021-05-11 DIAGNOSIS — M419 Scoliosis, unspecified: Secondary | ICD-10-CM | POA: Diagnosis not present

## 2021-05-11 DIAGNOSIS — R2689 Other abnormalities of gait and mobility: Secondary | ICD-10-CM | POA: Diagnosis not present

## 2021-05-11 DIAGNOSIS — R41841 Cognitive communication deficit: Secondary | ICD-10-CM | POA: Diagnosis not present

## 2021-05-11 DIAGNOSIS — R627 Adult failure to thrive: Secondary | ICD-10-CM | POA: Diagnosis not present

## 2021-05-11 DIAGNOSIS — R2681 Unsteadiness on feet: Secondary | ICD-10-CM | POA: Diagnosis not present

## 2021-05-12 DIAGNOSIS — R2689 Other abnormalities of gait and mobility: Secondary | ICD-10-CM | POA: Diagnosis not present

## 2021-05-12 DIAGNOSIS — R627 Adult failure to thrive: Secondary | ICD-10-CM | POA: Diagnosis not present

## 2021-05-12 DIAGNOSIS — M6281 Muscle weakness (generalized): Secondary | ICD-10-CM | POA: Diagnosis not present

## 2021-05-12 DIAGNOSIS — R41841 Cognitive communication deficit: Secondary | ICD-10-CM | POA: Diagnosis not present

## 2021-05-12 DIAGNOSIS — R2681 Unsteadiness on feet: Secondary | ICD-10-CM | POA: Diagnosis not present

## 2021-05-12 DIAGNOSIS — M419 Scoliosis, unspecified: Secondary | ICD-10-CM | POA: Diagnosis not present

## 2021-05-13 DIAGNOSIS — M6281 Muscle weakness (generalized): Secondary | ICD-10-CM | POA: Diagnosis not present

## 2021-05-13 DIAGNOSIS — M419 Scoliosis, unspecified: Secondary | ICD-10-CM | POA: Diagnosis not present

## 2021-05-13 DIAGNOSIS — R2681 Unsteadiness on feet: Secondary | ICD-10-CM | POA: Diagnosis not present

## 2021-05-13 DIAGNOSIS — R627 Adult failure to thrive: Secondary | ICD-10-CM | POA: Diagnosis not present

## 2021-05-13 DIAGNOSIS — R41841 Cognitive communication deficit: Secondary | ICD-10-CM | POA: Diagnosis not present

## 2021-05-13 DIAGNOSIS — R2689 Other abnormalities of gait and mobility: Secondary | ICD-10-CM | POA: Diagnosis not present

## 2021-05-16 DIAGNOSIS — R2689 Other abnormalities of gait and mobility: Secondary | ICD-10-CM | POA: Diagnosis not present

## 2021-05-16 DIAGNOSIS — R627 Adult failure to thrive: Secondary | ICD-10-CM | POA: Diagnosis not present

## 2021-05-16 DIAGNOSIS — R41841 Cognitive communication deficit: Secondary | ICD-10-CM | POA: Diagnosis not present

## 2021-05-16 DIAGNOSIS — M6281 Muscle weakness (generalized): Secondary | ICD-10-CM | POA: Diagnosis not present

## 2021-05-16 DIAGNOSIS — M419 Scoliosis, unspecified: Secondary | ICD-10-CM | POA: Diagnosis not present

## 2021-05-16 DIAGNOSIS — R2681 Unsteadiness on feet: Secondary | ICD-10-CM | POA: Diagnosis not present

## 2021-05-17 DIAGNOSIS — R2681 Unsteadiness on feet: Secondary | ICD-10-CM | POA: Diagnosis not present

## 2021-05-17 DIAGNOSIS — R41841 Cognitive communication deficit: Secondary | ICD-10-CM | POA: Diagnosis not present

## 2021-05-17 DIAGNOSIS — M6281 Muscle weakness (generalized): Secondary | ICD-10-CM | POA: Diagnosis not present

## 2021-05-17 DIAGNOSIS — R2689 Other abnormalities of gait and mobility: Secondary | ICD-10-CM | POA: Diagnosis not present

## 2021-05-17 DIAGNOSIS — M419 Scoliosis, unspecified: Secondary | ICD-10-CM | POA: Diagnosis not present

## 2021-05-17 DIAGNOSIS — R627 Adult failure to thrive: Secondary | ICD-10-CM | POA: Diagnosis not present

## 2021-05-18 DIAGNOSIS — R627 Adult failure to thrive: Secondary | ICD-10-CM | POA: Diagnosis not present

## 2021-05-18 DIAGNOSIS — R2681 Unsteadiness on feet: Secondary | ICD-10-CM | POA: Diagnosis not present

## 2021-05-18 DIAGNOSIS — R41841 Cognitive communication deficit: Secondary | ICD-10-CM | POA: Diagnosis not present

## 2021-05-18 DIAGNOSIS — M6281 Muscle weakness (generalized): Secondary | ICD-10-CM | POA: Diagnosis not present

## 2021-05-18 DIAGNOSIS — M419 Scoliosis, unspecified: Secondary | ICD-10-CM | POA: Diagnosis not present

## 2021-05-18 DIAGNOSIS — R2689 Other abnormalities of gait and mobility: Secondary | ICD-10-CM | POA: Diagnosis not present

## 2021-05-19 DIAGNOSIS — R2681 Unsteadiness on feet: Secondary | ICD-10-CM | POA: Diagnosis not present

## 2021-05-19 DIAGNOSIS — M6281 Muscle weakness (generalized): Secondary | ICD-10-CM | POA: Diagnosis not present

## 2021-05-19 DIAGNOSIS — R627 Adult failure to thrive: Secondary | ICD-10-CM | POA: Diagnosis not present

## 2021-05-19 DIAGNOSIS — R41841 Cognitive communication deficit: Secondary | ICD-10-CM | POA: Diagnosis not present

## 2021-05-19 DIAGNOSIS — M419 Scoliosis, unspecified: Secondary | ICD-10-CM | POA: Diagnosis not present

## 2021-05-19 DIAGNOSIS — R2689 Other abnormalities of gait and mobility: Secondary | ICD-10-CM | POA: Diagnosis not present

## 2021-05-20 DIAGNOSIS — R41841 Cognitive communication deficit: Secondary | ICD-10-CM | POA: Diagnosis not present

## 2021-05-20 DIAGNOSIS — M419 Scoliosis, unspecified: Secondary | ICD-10-CM | POA: Diagnosis not present

## 2021-05-20 DIAGNOSIS — R2681 Unsteadiness on feet: Secondary | ICD-10-CM | POA: Diagnosis not present

## 2021-05-20 DIAGNOSIS — M6281 Muscle weakness (generalized): Secondary | ICD-10-CM | POA: Diagnosis not present

## 2021-05-20 DIAGNOSIS — R627 Adult failure to thrive: Secondary | ICD-10-CM | POA: Diagnosis not present

## 2021-05-20 DIAGNOSIS — R2689 Other abnormalities of gait and mobility: Secondary | ICD-10-CM | POA: Diagnosis not present

## 2021-05-24 DIAGNOSIS — R41841 Cognitive communication deficit: Secondary | ICD-10-CM | POA: Diagnosis not present

## 2021-05-24 DIAGNOSIS — M419 Scoliosis, unspecified: Secondary | ICD-10-CM | POA: Diagnosis not present

## 2021-05-24 DIAGNOSIS — M6281 Muscle weakness (generalized): Secondary | ICD-10-CM | POA: Diagnosis not present

## 2021-05-24 DIAGNOSIS — R2689 Other abnormalities of gait and mobility: Secondary | ICD-10-CM | POA: Diagnosis not present

## 2021-05-24 DIAGNOSIS — R2681 Unsteadiness on feet: Secondary | ICD-10-CM | POA: Diagnosis not present

## 2021-05-24 DIAGNOSIS — R627 Adult failure to thrive: Secondary | ICD-10-CM | POA: Diagnosis not present

## 2021-05-25 DIAGNOSIS — M5416 Radiculopathy, lumbar region: Secondary | ICD-10-CM | POA: Diagnosis not present

## 2021-05-25 DIAGNOSIS — I4891 Unspecified atrial fibrillation: Secondary | ICD-10-CM | POA: Diagnosis not present

## 2021-05-25 DIAGNOSIS — M6281 Muscle weakness (generalized): Secondary | ICD-10-CM | POA: Diagnosis not present

## 2021-05-25 DIAGNOSIS — R41841 Cognitive communication deficit: Secondary | ICD-10-CM | POA: Diagnosis not present

## 2021-05-25 DIAGNOSIS — R2681 Unsteadiness on feet: Secondary | ICD-10-CM | POA: Diagnosis not present

## 2021-05-25 DIAGNOSIS — R627 Adult failure to thrive: Secondary | ICD-10-CM | POA: Diagnosis not present

## 2021-05-25 DIAGNOSIS — D509 Iron deficiency anemia, unspecified: Secondary | ICD-10-CM | POA: Diagnosis not present

## 2021-05-25 DIAGNOSIS — R2689 Other abnormalities of gait and mobility: Secondary | ICD-10-CM | POA: Diagnosis not present

## 2021-05-25 DIAGNOSIS — M419 Scoliosis, unspecified: Secondary | ICD-10-CM | POA: Diagnosis not present

## 2021-05-26 DIAGNOSIS — M6281 Muscle weakness (generalized): Secondary | ICD-10-CM | POA: Diagnosis not present

## 2021-05-26 DIAGNOSIS — R41841 Cognitive communication deficit: Secondary | ICD-10-CM | POA: Diagnosis not present

## 2021-05-26 DIAGNOSIS — R2681 Unsteadiness on feet: Secondary | ICD-10-CM | POA: Diagnosis not present

## 2021-05-26 DIAGNOSIS — R2689 Other abnormalities of gait and mobility: Secondary | ICD-10-CM | POA: Diagnosis not present

## 2021-05-26 DIAGNOSIS — M419 Scoliosis, unspecified: Secondary | ICD-10-CM | POA: Diagnosis not present

## 2021-05-26 DIAGNOSIS — R627 Adult failure to thrive: Secondary | ICD-10-CM | POA: Diagnosis not present

## 2021-05-27 DIAGNOSIS — M419 Scoliosis, unspecified: Secondary | ICD-10-CM | POA: Diagnosis not present

## 2021-05-27 DIAGNOSIS — M6281 Muscle weakness (generalized): Secondary | ICD-10-CM | POA: Diagnosis not present

## 2021-05-27 DIAGNOSIS — R627 Adult failure to thrive: Secondary | ICD-10-CM | POA: Diagnosis not present

## 2021-05-27 DIAGNOSIS — R2689 Other abnormalities of gait and mobility: Secondary | ICD-10-CM | POA: Diagnosis not present

## 2021-05-27 DIAGNOSIS — R2681 Unsteadiness on feet: Secondary | ICD-10-CM | POA: Diagnosis not present

## 2021-05-27 DIAGNOSIS — R41841 Cognitive communication deficit: Secondary | ICD-10-CM | POA: Diagnosis not present

## 2021-05-30 DIAGNOSIS — M6281 Muscle weakness (generalized): Secondary | ICD-10-CM | POA: Diagnosis not present

## 2021-05-30 DIAGNOSIS — M419 Scoliosis, unspecified: Secondary | ICD-10-CM | POA: Diagnosis not present

## 2021-05-30 DIAGNOSIS — R2681 Unsteadiness on feet: Secondary | ICD-10-CM | POA: Diagnosis not present

## 2021-05-30 DIAGNOSIS — R2689 Other abnormalities of gait and mobility: Secondary | ICD-10-CM | POA: Diagnosis not present

## 2021-05-30 DIAGNOSIS — R627 Adult failure to thrive: Secondary | ICD-10-CM | POA: Diagnosis not present

## 2021-05-30 DIAGNOSIS — R41841 Cognitive communication deficit: Secondary | ICD-10-CM | POA: Diagnosis not present

## 2021-05-31 DIAGNOSIS — R2681 Unsteadiness on feet: Secondary | ICD-10-CM | POA: Diagnosis not present

## 2021-05-31 DIAGNOSIS — R627 Adult failure to thrive: Secondary | ICD-10-CM | POA: Diagnosis not present

## 2021-05-31 DIAGNOSIS — M419 Scoliosis, unspecified: Secondary | ICD-10-CM | POA: Diagnosis not present

## 2021-05-31 DIAGNOSIS — R2689 Other abnormalities of gait and mobility: Secondary | ICD-10-CM | POA: Diagnosis not present

## 2021-05-31 DIAGNOSIS — R41841 Cognitive communication deficit: Secondary | ICD-10-CM | POA: Diagnosis not present

## 2021-05-31 DIAGNOSIS — M6281 Muscle weakness (generalized): Secondary | ICD-10-CM | POA: Diagnosis not present

## 2021-06-01 DIAGNOSIS — R627 Adult failure to thrive: Secondary | ICD-10-CM | POA: Diagnosis not present

## 2021-06-01 DIAGNOSIS — M419 Scoliosis, unspecified: Secondary | ICD-10-CM | POA: Diagnosis not present

## 2021-06-01 DIAGNOSIS — Z23 Encounter for immunization: Secondary | ICD-10-CM | POA: Diagnosis not present

## 2021-06-01 DIAGNOSIS — M6281 Muscle weakness (generalized): Secondary | ICD-10-CM | POA: Diagnosis not present

## 2021-06-01 DIAGNOSIS — R2689 Other abnormalities of gait and mobility: Secondary | ICD-10-CM | POA: Diagnosis not present

## 2021-06-01 DIAGNOSIS — R41841 Cognitive communication deficit: Secondary | ICD-10-CM | POA: Diagnosis not present

## 2021-06-01 DIAGNOSIS — R2681 Unsteadiness on feet: Secondary | ICD-10-CM | POA: Diagnosis not present

## 2021-06-02 DIAGNOSIS — M419 Scoliosis, unspecified: Secondary | ICD-10-CM | POA: Diagnosis not present

## 2021-06-02 DIAGNOSIS — R41841 Cognitive communication deficit: Secondary | ICD-10-CM | POA: Diagnosis not present

## 2021-06-02 DIAGNOSIS — R2689 Other abnormalities of gait and mobility: Secondary | ICD-10-CM | POA: Diagnosis not present

## 2021-06-02 DIAGNOSIS — M6281 Muscle weakness (generalized): Secondary | ICD-10-CM | POA: Diagnosis not present

## 2021-06-02 DIAGNOSIS — R2681 Unsteadiness on feet: Secondary | ICD-10-CM | POA: Diagnosis not present

## 2021-06-02 DIAGNOSIS — R627 Adult failure to thrive: Secondary | ICD-10-CM | POA: Diagnosis not present

## 2021-06-03 DIAGNOSIS — M6281 Muscle weakness (generalized): Secondary | ICD-10-CM | POA: Diagnosis not present

## 2021-06-03 DIAGNOSIS — M419 Scoliosis, unspecified: Secondary | ICD-10-CM | POA: Diagnosis not present

## 2021-06-03 DIAGNOSIS — R627 Adult failure to thrive: Secondary | ICD-10-CM | POA: Diagnosis not present

## 2021-06-03 DIAGNOSIS — R2681 Unsteadiness on feet: Secondary | ICD-10-CM | POA: Diagnosis not present

## 2021-06-03 DIAGNOSIS — R2689 Other abnormalities of gait and mobility: Secondary | ICD-10-CM | POA: Diagnosis not present

## 2021-06-03 DIAGNOSIS — R41841 Cognitive communication deficit: Secondary | ICD-10-CM | POA: Diagnosis not present

## 2021-06-06 DIAGNOSIS — M6281 Muscle weakness (generalized): Secondary | ICD-10-CM | POA: Diagnosis not present

## 2021-06-06 DIAGNOSIS — R2681 Unsteadiness on feet: Secondary | ICD-10-CM | POA: Diagnosis not present

## 2021-06-06 DIAGNOSIS — R627 Adult failure to thrive: Secondary | ICD-10-CM | POA: Diagnosis not present

## 2021-06-06 DIAGNOSIS — R41841 Cognitive communication deficit: Secondary | ICD-10-CM | POA: Diagnosis not present

## 2021-06-06 DIAGNOSIS — M419 Scoliosis, unspecified: Secondary | ICD-10-CM | POA: Diagnosis not present

## 2021-06-06 DIAGNOSIS — R2689 Other abnormalities of gait and mobility: Secondary | ICD-10-CM | POA: Diagnosis not present

## 2021-06-07 DIAGNOSIS — R627 Adult failure to thrive: Secondary | ICD-10-CM | POA: Diagnosis not present

## 2021-06-07 DIAGNOSIS — M419 Scoliosis, unspecified: Secondary | ICD-10-CM | POA: Diagnosis not present

## 2021-06-07 DIAGNOSIS — R2689 Other abnormalities of gait and mobility: Secondary | ICD-10-CM | POA: Diagnosis not present

## 2021-06-07 DIAGNOSIS — R2681 Unsteadiness on feet: Secondary | ICD-10-CM | POA: Diagnosis not present

## 2021-06-07 DIAGNOSIS — M6281 Muscle weakness (generalized): Secondary | ICD-10-CM | POA: Diagnosis not present

## 2021-06-07 DIAGNOSIS — R41841 Cognitive communication deficit: Secondary | ICD-10-CM | POA: Diagnosis not present

## 2021-06-08 DIAGNOSIS — M6281 Muscle weakness (generalized): Secondary | ICD-10-CM | POA: Diagnosis not present

## 2021-06-08 DIAGNOSIS — R2681 Unsteadiness on feet: Secondary | ICD-10-CM | POA: Diagnosis not present

## 2021-06-08 DIAGNOSIS — R41841 Cognitive communication deficit: Secondary | ICD-10-CM | POA: Diagnosis not present

## 2021-06-08 DIAGNOSIS — R2689 Other abnormalities of gait and mobility: Secondary | ICD-10-CM | POA: Diagnosis not present

## 2021-06-08 DIAGNOSIS — M419 Scoliosis, unspecified: Secondary | ICD-10-CM | POA: Diagnosis not present

## 2021-06-08 DIAGNOSIS — R627 Adult failure to thrive: Secondary | ICD-10-CM | POA: Diagnosis not present

## 2021-06-09 DIAGNOSIS — R41841 Cognitive communication deficit: Secondary | ICD-10-CM | POA: Diagnosis not present

## 2021-06-09 DIAGNOSIS — R627 Adult failure to thrive: Secondary | ICD-10-CM | POA: Diagnosis not present

## 2021-06-09 DIAGNOSIS — M6281 Muscle weakness (generalized): Secondary | ICD-10-CM | POA: Diagnosis not present

## 2021-06-09 DIAGNOSIS — R2681 Unsteadiness on feet: Secondary | ICD-10-CM | POA: Diagnosis not present

## 2021-06-09 DIAGNOSIS — R2689 Other abnormalities of gait and mobility: Secondary | ICD-10-CM | POA: Diagnosis not present

## 2021-06-09 DIAGNOSIS — M419 Scoliosis, unspecified: Secondary | ICD-10-CM | POA: Diagnosis not present

## 2021-06-10 DIAGNOSIS — M419 Scoliosis, unspecified: Secondary | ICD-10-CM | POA: Diagnosis not present

## 2021-06-10 DIAGNOSIS — R2689 Other abnormalities of gait and mobility: Secondary | ICD-10-CM | POA: Diagnosis not present

## 2021-06-10 DIAGNOSIS — M6281 Muscle weakness (generalized): Secondary | ICD-10-CM | POA: Diagnosis not present

## 2021-06-10 DIAGNOSIS — R41841 Cognitive communication deficit: Secondary | ICD-10-CM | POA: Diagnosis not present

## 2021-06-10 DIAGNOSIS — R627 Adult failure to thrive: Secondary | ICD-10-CM | POA: Diagnosis not present

## 2021-06-10 DIAGNOSIS — R2681 Unsteadiness on feet: Secondary | ICD-10-CM | POA: Diagnosis not present

## 2021-06-14 DIAGNOSIS — M6281 Muscle weakness (generalized): Secondary | ICD-10-CM | POA: Diagnosis not present

## 2021-06-14 DIAGNOSIS — R627 Adult failure to thrive: Secondary | ICD-10-CM | POA: Diagnosis not present

## 2021-06-14 DIAGNOSIS — R41841 Cognitive communication deficit: Secondary | ICD-10-CM | POA: Diagnosis not present

## 2021-06-14 DIAGNOSIS — R2689 Other abnormalities of gait and mobility: Secondary | ICD-10-CM | POA: Diagnosis not present

## 2021-06-14 DIAGNOSIS — M419 Scoliosis, unspecified: Secondary | ICD-10-CM | POA: Diagnosis not present

## 2021-06-14 DIAGNOSIS — R2681 Unsteadiness on feet: Secondary | ICD-10-CM | POA: Diagnosis not present

## 2021-06-17 DIAGNOSIS — R627 Adult failure to thrive: Secondary | ICD-10-CM | POA: Diagnosis not present

## 2021-06-17 DIAGNOSIS — M6281 Muscle weakness (generalized): Secondary | ICD-10-CM | POA: Diagnosis not present

## 2021-06-17 DIAGNOSIS — R41841 Cognitive communication deficit: Secondary | ICD-10-CM | POA: Diagnosis not present

## 2021-06-17 DIAGNOSIS — R2689 Other abnormalities of gait and mobility: Secondary | ICD-10-CM | POA: Diagnosis not present

## 2021-06-17 DIAGNOSIS — M419 Scoliosis, unspecified: Secondary | ICD-10-CM | POA: Diagnosis not present

## 2021-06-17 DIAGNOSIS — R2681 Unsteadiness on feet: Secondary | ICD-10-CM | POA: Diagnosis not present

## 2021-06-18 DIAGNOSIS — M6281 Muscle weakness (generalized): Secondary | ICD-10-CM | POA: Diagnosis not present

## 2021-06-18 DIAGNOSIS — R2681 Unsteadiness on feet: Secondary | ICD-10-CM | POA: Diagnosis not present

## 2021-06-18 DIAGNOSIS — R627 Adult failure to thrive: Secondary | ICD-10-CM | POA: Diagnosis not present

## 2021-06-18 DIAGNOSIS — R2689 Other abnormalities of gait and mobility: Secondary | ICD-10-CM | POA: Diagnosis not present

## 2021-06-18 DIAGNOSIS — M419 Scoliosis, unspecified: Secondary | ICD-10-CM | POA: Diagnosis not present

## 2021-06-18 DIAGNOSIS — R41841 Cognitive communication deficit: Secondary | ICD-10-CM | POA: Diagnosis not present

## 2021-06-21 DIAGNOSIS — R2681 Unsteadiness on feet: Secondary | ICD-10-CM | POA: Diagnosis not present

## 2021-06-21 DIAGNOSIS — R2689 Other abnormalities of gait and mobility: Secondary | ICD-10-CM | POA: Diagnosis not present

## 2021-06-21 DIAGNOSIS — R41841 Cognitive communication deficit: Secondary | ICD-10-CM | POA: Diagnosis not present

## 2021-06-21 DIAGNOSIS — R627 Adult failure to thrive: Secondary | ICD-10-CM | POA: Diagnosis not present

## 2021-06-21 DIAGNOSIS — M6281 Muscle weakness (generalized): Secondary | ICD-10-CM | POA: Diagnosis not present

## 2021-06-21 DIAGNOSIS — M419 Scoliosis, unspecified: Secondary | ICD-10-CM | POA: Diagnosis not present

## 2021-06-22 DIAGNOSIS — R2689 Other abnormalities of gait and mobility: Secondary | ICD-10-CM | POA: Diagnosis not present

## 2021-06-22 DIAGNOSIS — M419 Scoliosis, unspecified: Secondary | ICD-10-CM | POA: Diagnosis not present

## 2021-06-22 DIAGNOSIS — R41841 Cognitive communication deficit: Secondary | ICD-10-CM | POA: Diagnosis not present

## 2021-06-22 DIAGNOSIS — R2681 Unsteadiness on feet: Secondary | ICD-10-CM | POA: Diagnosis not present

## 2021-06-22 DIAGNOSIS — R627 Adult failure to thrive: Secondary | ICD-10-CM | POA: Diagnosis not present

## 2021-06-22 DIAGNOSIS — M6281 Muscle weakness (generalized): Secondary | ICD-10-CM | POA: Diagnosis not present

## 2021-07-18 NOTE — Progress Notes (Signed)
Cardiology Office Note    Date:  07/20/2021   ID:  [name], DOB Jun 06, 1929, MRN [id number]  PCP:  [name]., MD  Cardiologist:  Erin Gundry Martinique, MD    History of Present Illness:  [name] is a 87 y.o. female who has a history of coronary disease and is s/p prior angioplasty of the diagonal branch in 1992. She had stenting of the right coronary in 2005. She has a history of hypertension. She has a history of mild-moderate aortic stenosis by Echo in 2015. Repeat in October 2019 was unchanged. She has chronic back pain related to scoliosis.  She is now living at [facility] now in assisted living.  She was seen by me on 09/14/20 and was doing OK. She was admitted shortly after from 09/20/2020 to 09/22/2020 for atrial fibrillation after presenting with chest pain and shortness of breath but no palpitations. Rates were as high as the 190's. She was cardioverted in the field by EMS with restoration of sinus rhythm. Chest pain resolved with restoration of sinus rhythm. In the ED, she was found to have acute anemia with hemoglobin of 7.7 (down from 9.1 on 09/17/2020 and 13.8 in 09/2019). High-sensitivity troponin peaked at 603 but this was felt to due to demand ischemia from atrial fibrillation with RVR and anemia.  Echo showed LVEF of 65-70% with normal wall motion, grade 1 diastolic dysfunction, severely dilated left atrium, moderate aortic stenosis, mild mitral regurgitation. In regards to anemia work-up, hemoccult was negative but iron and ferritin were low consistent with iron deficiency anemia. She was transfused but did develop some itching post-transfusion. She also received one dose of IV iron and then started on PO regimen. Patient was not interested in inpatient endoscopic evaluation given no hematochezia or melena. Hemoglobin improved to 10.2 on day of discharge. CHA2DS2-VASc = 5 (CAD, HTN, age x2, female). However, given acute anemia, anticoagulation was not started. There was  also concern that she may be at increased risk of anticoagulation due to confusion demonstrated during admission and increased risk of falls.  She was seen in the ED in Dec following a fall. Foot got caught and she fell. Had lacerated lip and bruises but no fracture. Had some swelling in the left ankle that is better now.   Seen in ED on April 30 with mechanical fall. Fractured 3 ribs. Also had mechanical falls in April and May without falls. She has moved to assisted living.   On follow up today she is seen with her sister. She reports she is doing well. No further falls. Is not aware of any heart racing, palpitations, SOB, or chest pain.   Past Medical History:  Diagnosis Date   Anxiety and depression    Aortic stenosis    a. 03/2014: Mild aortic stenosis by valve area and mean gradient though appeared more visually consistent with moderate AS.    CAD (coronary artery disease)    a. s/p prior PCI of D2 in 1992 b. stent to RCA in 2005   Diverticula of colon    GERD (gastroesophageal reflux disease)    HTN (hypertension)    Hypercholesteremia    Lateral myocardial infarction [facility]) 1992   Osteoarthritis    Osteopenia    Scoliosis     Past Surgical History:  Procedure Laterality Date   ABDOMINAL HYSTERECTOMY     APPENDECTOMY     CORONARY ANGIOPLASTY     TONSILLECTOMY      Current Medications:  Outpatient Medications Prior to Visit  Medication Sig Dispense Refill   amLODipine (NORVASC) 10 MG tablet Take 1 tablet (10 mg total) by mouth daily. 30 tablet 0   amLODipine (NORVASC) 5 MG tablet Take 5 mg by mouth daily.     aspirin EC 81 MG tablet Take 1 tablet (81 mg total) by mouth daily. Swallow whole. 90 tablet 3   atorvastatin (LIPITOR) 40 MG tablet TAKE 1 TABLET DAILY 90 tablet 3   cholecalciferol (VITAMIN D) 1000 UNITS tablet Take 1,000 Units by mouth 2 (two) times daily.     ferrous sulfate 325 (65 FE) MG tablet Take 1 tablet (325 mg total) by mouth daily. 30 tablet 3    hydrochlorothiazide (HYDRODIURIL) 25 MG tablet TAKE 1 TABLET DAILY 90 tablet 3   losartan (COZAAR) 100 MG tablet Take 1 tablet (100 mg total) by mouth daily. 90 tablet 3   nitroGLYCERIN (NITROSTAT) 0.4 MG SL tablet Place 1 tablet (0.4 mg total) under the tongue every 5 (five) minutes as needed for chest pain. 25 tablet 11   oxyCODONE-acetaminophen (PERCOCET/ROXICET) 5-325 MG tablet Take 0.5-1 tablets by mouth every 8 (eight) hours as needed for severe pain. 8 tablet 0   Melatonin 1 MG CAPS Take 1 capsule by mouth every evening.  (Patient not taking: Reported on 07/20/2021)     metoprolol succinate (TOPROL-XL) 50 MG 24 hr tablet TAKE 1 TABLET EVERY DAY    WITH OR RIGHT AFTER A MEAL (Patient not taking: No sig reported) 90 tablet 3   No facility-administered medications prior to visit.     Allergies:   Nifedipine, Tetanus toxoids, and Adhesive [tape]   Social History   Socioeconomic History   Marital status: Divorced    Spouse name: Not on file   Number of children: 1   Years of education: Not on file   Highest education level: Not on file  Occupational History   Occupation: retired  Tobacco Use   Smoking status: Former    Types: Cigarettes    Quit date: 12/26/1991    Years since quitting: 29.5   Smokeless tobacco: Never  Vaping Use   Vaping Use: Never used  Substance and Sexual Activity   Alcohol use: No    Alcohol/week: 0.0 standard drinks   Drug use: Yes    Types: Methylphenidate   Sexual activity: Not on file  Other Topics Concern   Not on file  Social History Narrative   Not on file   Social Determinants of Health   Financial Resource Strain: Not on file  Food Insecurity: Not on file  Transportation Needs: Not on file  Physical Activity: Not on file  Stress: Not on file  Social Connections: Not on file     Family History:  The patient's family history includes Cancer in her brother; Heart failure in her mother; Hypertension in her father and mother.   ROS:    Please see the history of present illness.    ROS All other systems reviewed and are negative.   PHYSICAL EXAM:   VS:  BP (!) 150/80 (BP Location: Left Arm)   Pulse (!) 58   Ht 5' (1.524 m)   Wt 122 lb 6.4 oz (55.5 kg)   BMI 23.90 kg/m    GENERAL:  Well appearing, elderly WF in NAD HEENT:  PERRL, EOMI, sclera are clear. Oropharynx is clear. NECK:  No jugular venous distention, carotid upstroke brisk and symmetric, no bruits, no thyromegaly or adenopathy LUNGS:  Clear to  auscultation bilaterally CHEST:  Unremarkable HEART:  RRR,  PMI not displaced or sustained,S1 and S2 within normal limits, no S3, no S4: no clicks, no rubs, gr [id number] harsh systolic murmur RUSB radiates to carotids. EXT:  2 + pulses throughout, no edema, no cyanosis no clubbing SKIN:  Warm and dry.  No rashes NEURO:  Alert and oriented x 3. Cranial nerves II through XII intact. PSYCH:  Cognitively intact   Wt Readings from Last 3 Encounters:  07/20/21 122 lb 6.4 oz (55.5 kg)  05/02/21 125 lb (56.7 kg)  01/19/21 118 lb 9.6 oz (53.8 kg)      Studies/Labs Reviewed:    Recent Labs: 09/21/2020: TSH 1.442 09/22/2020: Magnesium 2.0 05/02/2021: ALT 18; BUN 15; Creatinine, Ser 0.59; Hemoglobin 13.9; Platelets 235; Potassium 3.2; Sodium 140   Lipid Panel    Component Value Date/Time   CHOL 138 09/17/2020 1559   TRIG 70 09/17/2020 1559   HDL 73 09/17/2020 1559   CHOLHDL 1.9 09/17/2020 1559   CHOLHDL 2 08/22/2013 0946   VLDL 24.0 08/22/2013 0946   LDLCALC 51 09/17/2020 1559    Additional studies/ records that were reviewed today include:  Labs from 07/28/16: cholesterol 156, triglycerides 42, LDL 83, HDL 65. CMET, TSH, CBC all normal.  Dated 08/06/17: cholesterol 145, triglycerides 67, HDL 59, LDL 73, CMET, CBC normal. TSH normal.  Dated 08/12/18: cholesterol 150, triglycerides 72, HDL 65, LDL 71. Chemistries, Hgb, and TSH normal.   Echo 09/30/18: Study Conclusions   - Left ventricle: The cavity size was normal.  Wall thickness was   increased in a pattern of moderate LVH. There was focal basal   hypertrophy. Systolic function was normal. The estimated ejection   fraction was in the range of 60% to 65%. Wall motion was normal;   there were no regional wall motion abnormalities. - Aortic valve: Moderately calcified annulus. Moderately thickened,   moderately calcified leaflets. There was mild to moderate   stenosis. Valve area (VTI): 1.39 cm^2. Valve area (Vmax): 1.29   cm^2. Valve area (Vmean): 1.12 cm^2. - Pulmonary arteries: PA peak pressure: 31 mm Hg (S).  Echocardiogram 09/21/2020: Impressions: 1. Left ventricular ejection fraction, by estimation, is 65 to 70%. The  left ventricle has normal function. The left ventricle has no regional  wall motion abnormalities. There is mild left ventricular hypertrophy.  Left ventricular diastolic parameters  are consistent with Grade I diastolic dysfunction (impaired relaxation).  Elevated left ventricular end-diastolic pressure. The E/e' is 13.   2. Right ventricular systolic function is normal. The right ventricular  size is normal. There is mildly elevated pulmonary artery systolic  pressure. The estimated right ventricular systolic pressure is 123456 mmHg.   3. Left atrial size was severely dilated.   4. Right atrial size was mildly dilated.   5. The mitral valve is abnormal. Mild mitral valve regurgitation.   6. The aortic valve is tricuspid. Aortic valve regurgitation is not  visualized. Moderate aortic valve stenosis. Aortic valve area, by VTI  measures 1.07 cm. Aortic valve mean gradient measures 20.0 mmHg. Aortic  valve Vmax measures 3.02 m/s. Peak  gradient 37 mmHg. DI 0.34   7. The inferior vena cava is normal in size with <50% respiratory  variability, suggesting right atrial pressure of 8 mmHg.   Comparison(s): Prior images unable to be directly viewed, comparison made by report only. Changes from prior study are noted. 09/30/2018: LVEF  60-65%, mild to moderate AS -mean/peak gradient 13/26 mmHg.  ASSESSMENT:    1. Coronary artery disease involving native coronary artery of native heart without angina pectoris   2. Paroxysmal atrial fibrillation (HCC)   3. Primary hypertension   4. Hyperlipidemia, unspecified hyperlipidemia type   5. Aortic valve stenosis, etiology of cardiac valve disease unspecified       PLAN:  In order of problems listed above:  1. Paroxysmal Atrial Fibrillation - admitted in September 2021 with atrial fibrillation with RVR but converted back to normal sinus rhythm with DCCV. - Maintaining sinus rhythm.  - Continue Toprol-XL '50mg'$  daily. - CHA2DS2-VASc = 5 (CAD, HTN, age x2, female). However, patient was not started on anticoagulation given acute anemia and risk for falls. Since no recurrent Afib to date will not push for anticoagulation now.    2. CAD Demand Ischemia - History of prior stenting to D2 in 1992 and RCA in 2005. - Patient did have chest pain and elevated troponin inn September. However, chest pain resolved with restoration of sinus rhythm. Troponin elevation felt to be due to demand ischemia in setting of atrial fibrillation with RVR, DCCV, and acute anemia.  - No recurrent chest pain.  - Continue aspirin, beta-blocker, and statin.  3. Moderate Aortic Stenosis - Echo on 09/21/2020 during recent admission showed LVEF of 65-70% with normal wall motion and moderate AS. - will monitor for now   4. Hypertension - BP is actually better than usual. - Continue current medications: Amlodipine '10mg'$  daily, HCTZ '25mg'$  daily, Losartan '100mg'$  daily, and Toprol-XL '50mg'$  daily.   6.Hyperlipidemia - Continue Lipitor '40mg'$  daily.    7. Iron Deficiency Anemia - Patient presented with hemoglobin of 7.7 in September. Treated with transfusion of PRBCs and IV iron.  Hemoglobin 10.2 on discharge. Of note, no GI evaluation was performed during admission. - Hgb improved to 13.8 in May - follow up  per Dr Brigitte Pulse   Follow up in one year   Signed, Fady Stamps Martinique, MD  07/20/2021 2:49 PM    [facility] [address], [place], [place], [address] [phone]

## 2021-07-20 ENCOUNTER — Ambulatory Visit (INDEPENDENT_AMBULATORY_CARE_PROVIDER_SITE_OTHER): Payer: Medicare Other | Admitting: Cardiology

## 2021-07-20 ENCOUNTER — Encounter: Payer: Self-pay | Admitting: Cardiology

## 2021-07-20 ENCOUNTER — Other Ambulatory Visit: Payer: Self-pay

## 2021-07-20 VITALS — BP 150/80 | HR 58 | Ht 60.0 in | Wt 122.4 lb

## 2021-07-20 DIAGNOSIS — I480 Paroxysmal atrial fibrillation: Secondary | ICD-10-CM | POA: Diagnosis not present

## 2021-07-20 DIAGNOSIS — I350 Nonrheumatic aortic (valve) stenosis: Secondary | ICD-10-CM

## 2021-07-20 DIAGNOSIS — I2510 Atherosclerotic heart disease of native coronary artery without angina pectoris: Secondary | ICD-10-CM | POA: Diagnosis not present

## 2021-07-20 DIAGNOSIS — E785 Hyperlipidemia, unspecified: Secondary | ICD-10-CM

## 2021-07-20 DIAGNOSIS — I10 Essential (primary) hypertension: Secondary | ICD-10-CM

## 2021-09-14 DIAGNOSIS — Z23 Encounter for immunization: Secondary | ICD-10-CM | POA: Diagnosis not present

## 2021-09-29 DIAGNOSIS — D649 Anemia, unspecified: Secondary | ICD-10-CM | POA: Diagnosis not present

## 2021-09-29 DIAGNOSIS — E785 Hyperlipidemia, unspecified: Secondary | ICD-10-CM | POA: Diagnosis not present

## 2021-09-29 DIAGNOSIS — Z7189 Other specified counseling: Secondary | ICD-10-CM | POA: Diagnosis not present

## 2021-09-29 DIAGNOSIS — R82998 Other abnormal findings in urine: Secondary | ICD-10-CM | POA: Diagnosis not present

## 2021-09-29 DIAGNOSIS — I209 Angina pectoris, unspecified: Secondary | ICD-10-CM | POA: Diagnosis not present

## 2021-09-29 DIAGNOSIS — I350 Nonrheumatic aortic (valve) stenosis: Secondary | ICD-10-CM | POA: Diagnosis not present

## 2021-09-29 DIAGNOSIS — R413 Other amnesia: Secondary | ICD-10-CM | POA: Diagnosis not present

## 2021-09-29 DIAGNOSIS — Z1331 Encounter for screening for depression: Secondary | ICD-10-CM | POA: Diagnosis not present

## 2021-09-29 DIAGNOSIS — I4891 Unspecified atrial fibrillation: Secondary | ICD-10-CM | POA: Diagnosis not present

## 2021-09-29 DIAGNOSIS — D509 Iron deficiency anemia, unspecified: Secondary | ICD-10-CM | POA: Diagnosis not present

## 2021-09-29 DIAGNOSIS — I10 Essential (primary) hypertension: Secondary | ICD-10-CM | POA: Diagnosis not present

## 2021-09-29 DIAGNOSIS — Z1339 Encounter for screening examination for other mental health and behavioral disorders: Secondary | ICD-10-CM | POA: Diagnosis not present

## 2021-09-29 DIAGNOSIS — M810 Age-related osteoporosis without current pathological fracture: Secondary | ICD-10-CM | POA: Diagnosis not present

## 2021-09-29 DIAGNOSIS — Z0000 Encounter for general adult medical examination without abnormal findings: Secondary | ICD-10-CM | POA: Diagnosis not present

## 2021-09-29 DIAGNOSIS — I2510 Atherosclerotic heart disease of native coronary artery without angina pectoris: Secondary | ICD-10-CM | POA: Diagnosis not present

## 2021-09-29 DIAGNOSIS — Z9861 Coronary angioplasty status: Secondary | ICD-10-CM | POA: Diagnosis not present

## 2021-11-23 ENCOUNTER — Encounter: Payer: Self-pay | Admitting: Nurse Practitioner

## 2021-11-23 ENCOUNTER — Non-Acute Institutional Stay: Payer: Medicare Other | Admitting: Nurse Practitioner

## 2021-11-23 DIAGNOSIS — I2583 Coronary atherosclerosis due to lipid rich plaque: Secondary | ICD-10-CM | POA: Diagnosis not present

## 2021-11-23 DIAGNOSIS — I480 Paroxysmal atrial fibrillation: Secondary | ICD-10-CM | POA: Diagnosis not present

## 2021-11-23 DIAGNOSIS — R410 Disorientation, unspecified: Secondary | ICD-10-CM

## 2021-11-23 DIAGNOSIS — I2510 Atherosclerotic heart disease of native coronary artery without angina pectoris: Secondary | ICD-10-CM

## 2021-11-23 DIAGNOSIS — E7800 Pure hypercholesterolemia, unspecified: Secondary | ICD-10-CM | POA: Diagnosis not present

## 2021-11-23 DIAGNOSIS — I350 Nonrheumatic aortic (valve) stenosis: Secondary | ICD-10-CM | POA: Diagnosis not present

## 2021-11-23 DIAGNOSIS — D649 Anemia, unspecified: Secondary | ICD-10-CM | POA: Diagnosis not present

## 2021-11-23 DIAGNOSIS — I10 Essential (primary) hypertension: Secondary | ICD-10-CM | POA: Diagnosis not present

## 2021-11-23 DIAGNOSIS — I5189 Other ill-defined heart diseases: Secondary | ICD-10-CM | POA: Diagnosis not present

## 2021-11-23 DIAGNOSIS — F01518 Vascular dementia, unspecified severity, with other behavioral disturbance: Secondary | ICD-10-CM | POA: Insufficient documentation

## 2021-11-23 NOTE — Progress Notes (Signed)
Location:   [facility] Room Number: 18 Place of Service:  ALF [phone]) Provider:  Layne Lebon X, NP  Ginger Organ., MD  Patient Care Team: Ginger Organ., MD as PCP - General (Internal Medicine) Martinique, Peter M, MD as PCP - Cardiology (Cardiology)  Extended Emergency Contact Information Primary Emergency Contact: [name],Patricia Address: [place]          [place] [address] [name] of [place] Phone: [phone] Mobile Phone: [phone] Relation: Sister Secondary Emergency Contact: [name],[name]  [name] of [place] Phone: [phone] Mobile Phone: [phone] Relation: Niece  Code Status:  FULL CODE Goals of care: Advanced Directive information Advanced Directives 11/23/2021  Does Patient Have a Medical Advance Directive? Yes  Type of Paramedic of [place];Living will  Does patient want to make changes to medical advance directive? No - Patient declined  Copy of [place] in Chart? Yes - validated most recent copy scanned in chart (See row information)  Would patient like information on creating a medical advance directive? -     Chief Complaint  Patient presents with   Acute Visit    Increased confusion    HPI:  Pt is a 87 y.o. female seen today for an acute visit for reported the patient's increased confusion-asking staff how to take a shower, have never done it before. She told me there is another Roshell Brigham just moved into the facility too. No new focal neurological symptoms reported.   CAD, demand ischemia, stent 1992 and 2005,  followed by Cardiology, Dr. Martinique. On ASA, statin, beta blocker.   Grade ! Diastolic dysfunction, echo 09/21/20 LVEF 65-70%  PAF, converted to SR 08/2020, on Metoprolol, not on anticoagulation due to falls  HTN, on Amlodipine, HCTZ, Losartan, Metoprolol. Bun/creat 15/0.59 05/02/21  Hyperlipidemia, takes Atorvastatin. LDL 51 09/17/20  AS  monitor  IDA Hx of PRBC transfusion, IV iron. Hgb 13.9 05/02/21. On oral Fe  Past Medical History:  Diagnosis Date   Anxiety and depression    Aortic stenosis    a. 03/2014: Mild aortic stenosis by valve area and mean gradient though appeared more visually consistent with moderate AS.    CAD (coronary artery disease)    a. s/p prior PCI of D2 in 1992 b. stent to RCA in 2005   Diverticula of colon    GERD (gastroesophageal reflux disease)    HTN (hypertension)    Hypercholesteremia    Lateral myocardial infarction ([place]) 1992   Osteoarthritis    Osteopenia    Scoliosis    Past Surgical History:  Procedure Laterality Date   ABDOMINAL HYSTERECTOMY     APPENDECTOMY     CORONARY ANGIOPLASTY     TONSILLECTOMY      Allergies  Allergen Reactions   Nifedipine Other (See Comments)    Dark Urine; can only take orange tablet, allergic to yellow-brown tablet   Tetanus Toxoids    Tetanus-Diphtheria Toxoids Td     Other reaction(s): Unknown   Adhesive [Tape] Rash    Allergies as of 11/23/2021       Reactions   Nifedipine Other (See Comments)   Dark Urine; can only take orange tablet, allergic to yellow-brown tablet   Tetanus Toxoids    Tetanus-diphtheria Toxoids Td    Other reaction(s): Unknown   Adhesive [tape] Rash        Medication List        Accurate as of November 23, 2021  11:59 PM. If you have any questions, ask your nurse or doctor.          STOP taking these medications    Melatonin 1 MG Caps Stopped by: Cainan Trull X Jaccob Czaplicki, NP   oxyCODONE-acetaminophen 5-325 MG tablet Commonly known as: PERCOCET/ROXICET Stopped by: Arabelle Bollig X Namish Krise, NP       TAKE these medications    amLODipine 10 MG tablet Commonly known as: NORVASC Take 1 tablet (10 mg total) by mouth daily. What changed: Another medication with the same name was removed. Continue taking this medication, and follow the directions you see here. Changed by: Kumari Sculley X Tonyia Marschall, NP   aspirin EC 81 MG tablet Take 1  tablet (81 mg total) by mouth daily. Swallow whole.   atorvastatin 40 MG tablet Commonly known as: LIPITOR TAKE 1 TABLET DAILY   cholecalciferol 1000 units tablet Commonly known as: VITAMIN D Take 1,000 Units by mouth 2 (two) times daily.   ferrous sulfate 325 (65 FE) MG tablet Take 1 tablet (325 mg total) by mouth daily.   hydrochlorothiazide 25 MG tablet Commonly known as: HYDRODIURIL TAKE 1 TABLET DAILY   loratadine 10 MG tablet Commonly known as: CLARITIN Take 10 mg by mouth daily.   losartan 100 MG tablet Commonly known as: COZAAR Take 1 tablet (100 mg total) by mouth daily.   metoprolol succinate 50 MG 24 hr tablet Commonly known as: TOPROL-XL TAKE 1 TABLET EVERY DAY    WITH OR RIGHT AFTER A MEAL   nitroGLYCERIN 0.4 MG SL tablet Commonly known as: Nitrostat Place 1 tablet (0.4 mg total) under the tongue every 5 (five) minutes as needed for chest pain.        Review of Systems  Constitutional:  Negative for activity change, appetite change and fever.  HENT:  Positive for hearing loss. Negative for congestion and trouble swallowing.   Eyes:  Negative for visual disturbance.  Respiratory:  Negative for cough, chest tightness, shortness of breath and wheezing.   Gastrointestinal:  Negative for abdominal pain, constipation, nausea and vomiting.  Genitourinary:  Negative for difficulty urinating, dysuria, frequency and urgency.  Musculoskeletal:  Positive for gait problem.  Skin:  Negative for color change.  Neurological:  Negative for speech difficulty, weakness and headaches.       Memory lapses.   Psychiatric/Behavioral:  Positive for confusion. Negative for behavioral problems and sleep disturbance. The patient is not nervous/anxious.    Immunization History  Administered Date(s) Administered   Influenza Split 05/13/2009, 05/18/2010, 09/29/2011, 09/05/2012, 09/03/2013   Influenza, High Dose Seasonal PF 09/09/2015, 08/30/2017   Influenza, Quadrivalent,  Recombinant, Inj, Pf 08/19/2018, 09/29/2019   Influenza-Unspecified 10/18/2021   Pneumococcal Conjugate-13 07/08/2013   Pneumococcal Polysaccharide-23 06/29/2006, 05/13/2009, 05/18/2010, 05/25/2011   Td 05/13/2009, 05/18/2010, 05/25/2011   Zoster Recombinat (Shingrix) 08/24/2017, 10/31/2017   Zoster, Live 08/22/2008, 08/24/2008, 05/13/2009, 05/18/2010, 05/25/2011   Pertinent  Health Maintenance Due  Topic Date Due   INFLUENZA VACCINE  Completed   DEXA SCAN  Completed   Fall Risk 09/22/2020 09/22/2020 12/15/2020 04/23/2021 05/02/2021  Patient Fall Risk Level High fall risk High fall risk Low fall risk Low fall risk High fall risk   Functional Status Survey:    Vitals:   11/23/21 0945  BP: 102/81  Pulse: (!) 56  Resp: 18  Temp: 98 F (36.7 C)  SpO2: 94%  Weight: 127 lb (57.6 kg)  Height: 5' (1.524 m)   Body mass index is 24.8 kg/m. Physical Exam Vitals and nursing note  reviewed.  Constitutional:      Appearance: Normal appearance.  HENT:     Head: Normocephalic and atraumatic.     Nose: Nose normal.     Mouth/Throat:     Mouth: Mucous membranes are moist.  Eyes:     Extraocular Movements: Extraocular movements intact.     Conjunctiva/sclera: Conjunctivae normal.     Pupils: Pupils are equal, round, and reactive to light.  Cardiovascular:     Rate and Rhythm: Normal rate and regular rhythm.     Heart sounds: Murmur heard.  Pulmonary:     Effort: Pulmonary effort is normal.  Abdominal:     General: Bowel sounds are normal.     Palpations: Abdomen is soft.     Tenderness: There is no abdominal tenderness.  Musculoskeletal:     Cervical back: Normal range of motion and neck supple.     Right lower leg: No edema.  Skin:    General: Skin is warm and dry.  Neurological:     General: No focal deficit present.     Mental Status: She is alert. Mental status is at baseline.     Motor: No weakness.     Gait: Gait abnormal.     Deep Tendon Reflexes: Reflexes normal.   Psychiatric:        Mood and Affect: Mood normal.        Behavior: Behavior normal.    Labs reviewed: Recent Labs    05/02/21 1032  NA 140  K 3.2*  CL 104  CO2 27  GLUCOSE 93  BUN 15  CREATININE 0.59  CALCIUM 8.9   Recent Labs    05/02/21 1032  AST 30  ALT 18  ALKPHOS 101  BILITOT 0.7  PROT 7.6  ALBUMIN 4.0   Recent Labs    05/02/21 1032  WBC 7.2  NEUTROABS 5.6  HGB 13.9  HCT 44.9  MCV 92.2  PLT 235   Lab Results  Component Value Date   TSH 1.442 09/21/2020   No results found for: HGBA1C Lab Results  Component Value Date   CHOL 138 09/17/2020   HDL 73 09/17/2020   LDLCALC 51 09/17/2020   TRIG 70 09/17/2020   CHOLHDL 1.9 09/17/2020    Significant Diagnostic Results in last 30 days:  No results found.  Assessment/Plan Confusion  reported the patient's increased confusion-asking staff how to take a shower, have never done it before. She told me there is another Addalyn Speedy just moved into the facility too. No new focal neurological symptoms reported. Will obtain MMSE, CBC/diff, CMP/eGFR, TSH. Observe.   CAD (coronary artery disease)  demand ischemia, stent 1992 and 2005,  followed by Cardiology, Dr. Martinique. On ASA, statin, beta blocker.   Grade I diastolic dysfunction Grade ! Diastolic dysfunction, echo 09/21/20 LVEF 65-70%  PAF (paroxysmal atrial fibrillation) ([place]) converted to SR 08/2020, on Metoprolol, not on anticoagulation due to falls  HTN (hypertension) Blood pressure is controlled,  on Amlodipine, HCTZ, Losartan, Metoprolol. Bun/creat 15/0.59 05/02/21  Hypercholesteremia  takes Atorvastatin. LDL 51 09/17/20  Symptomatic anemia  Hx of PRBC transfusion, IV iron. Hgb 13.9 05/02/21. On oral Fe  Aortic stenosis, mild monitor    Family/ staff Communication: plan of care reviewed with the patient and charge nurse.   Labs/tests ordered:   CBC/diff, CMP/eGFR, TSH   Time spend 40 minutes.

## 2021-11-23 NOTE — Assessment & Plan Note (Signed)
demand ischemia, stent 1992 and 2005,  followed by Cardiology, Dr. Martinique. On ASA, statin, beta blocker.

## 2021-11-23 NOTE — Assessment & Plan Note (Signed)
Grade ! Diastolic dysfunction, echo 09/21/20 LVEF 65-70%

## 2021-11-23 NOTE — Assessment & Plan Note (Signed)
reported the patient's increased confusion-asking staff how to take a shower, have never done it before. She told me there is another Kaysia Willard just moved into the facility too. No new focal neurological symptoms reported. Will obtain MMSE, CBC/diff, CMP/eGFR, TSH. Observe.

## 2021-11-23 NOTE — Assessment & Plan Note (Signed)
Blood pressure is controlled,  on Amlodipine, HCTZ, Losartan, Metoprolol. Bun/creat 15/0.59 05/02/21

## 2021-11-23 NOTE — Assessment & Plan Note (Signed)
Hx of PRBC transfusion, IV iron. Hgb 13.9 05/02/21. On oral Fe

## 2021-11-23 NOTE — Assessment & Plan Note (Signed)
takes Atorvastatin. LDL 51 09/17/20

## 2021-11-23 NOTE — Assessment & Plan Note (Signed)
monitor

## 2021-11-23 NOTE — Assessment & Plan Note (Signed)
converted to [place] 08/2020, on Metoprolol, not on anticoagulation due to falls

## 2021-11-24 ENCOUNTER — Encounter: Payer: Self-pay | Admitting: Nurse Practitioner

## 2021-11-24 DIAGNOSIS — E039 Hypothyroidism, unspecified: Secondary | ICD-10-CM | POA: Diagnosis not present

## 2021-11-24 DIAGNOSIS — F0390 Unspecified dementia without behavioral disturbance: Secondary | ICD-10-CM | POA: Diagnosis not present

## 2021-11-24 DIAGNOSIS — D649 Anemia, unspecified: Secondary | ICD-10-CM | POA: Diagnosis not present

## 2021-11-24 LAB — CBC: RBC: 4.21 (ref 3.87–5.11)

## 2021-11-24 LAB — CBC AND DIFFERENTIAL
HCT: 38 (ref 36–46)
Hemoglobin: 12.3 (ref 12.0–16.0)
Platelets: 366 (ref 150–399)
WBC: 4.9

## 2021-11-24 LAB — BASIC METABOLIC PANEL
BUN: 12 (ref 4–21)
CO2: 27 — AB (ref 13–22)
Chloride: 106 (ref 99–108)
Creatinine: 0.4 — AB (ref 0.5–1.1)
Glucose: 87
Potassium: 4.4 (ref 3.4–5.3)
Sodium: 141 (ref 137–147)

## 2021-11-24 LAB — HEPATIC FUNCTION PANEL
ALT: 12 (ref 7–35)
AST: 16 (ref 13–35)
Alkaline Phosphatase: 106 (ref 25–125)

## 2021-11-24 LAB — COMPREHENSIVE METABOLIC PANEL
Albumin: 3.2 — AB (ref 3.5–5.0)
Calcium: 8.3 — AB (ref 8.7–10.7)

## 2021-11-24 LAB — TSH: TSH: 2.9 (ref 0.41–5.90)

## 2021-11-28 ENCOUNTER — Non-Acute Institutional Stay: Payer: Medicare Other | Admitting: Orthopedic Surgery

## 2021-11-28 ENCOUNTER — Encounter: Payer: Self-pay | Admitting: Orthopedic Surgery

## 2021-11-28 DIAGNOSIS — H9193 Unspecified hearing loss, bilateral: Secondary | ICD-10-CM

## 2021-11-28 DIAGNOSIS — I2510 Atherosclerotic heart disease of native coronary artery without angina pectoris: Secondary | ICD-10-CM | POA: Diagnosis not present

## 2021-11-28 DIAGNOSIS — D649 Anemia, unspecified: Secondary | ICD-10-CM | POA: Diagnosis not present

## 2021-11-28 DIAGNOSIS — R410 Disorientation, unspecified: Secondary | ICD-10-CM

## 2021-11-28 DIAGNOSIS — I350 Nonrheumatic aortic (valve) stenosis: Secondary | ICD-10-CM | POA: Diagnosis not present

## 2021-11-28 DIAGNOSIS — I5189 Other ill-defined heart diseases: Secondary | ICD-10-CM

## 2021-11-28 DIAGNOSIS — I2583 Coronary atherosclerosis due to lipid rich plaque: Secondary | ICD-10-CM

## 2021-11-28 DIAGNOSIS — I10 Essential (primary) hypertension: Secondary | ICD-10-CM | POA: Diagnosis not present

## 2021-11-28 DIAGNOSIS — E7800 Pure hypercholesterolemia, unspecified: Secondary | ICD-10-CM

## 2021-11-28 DIAGNOSIS — I480 Paroxysmal atrial fibrillation: Secondary | ICD-10-CM

## 2021-11-28 NOTE — Progress Notes (Signed)
Location:   [facility] Room Number: [place] of Service:  ALF [phone]) Provider:  Windell Moulding, NP    Patient Care Team: Ginger Organ., MD as PCP - General (Internal Medicine) Martinique, Peter M, MD as PCP - Cardiology (Cardiology)  Extended Emergency Contact Information Primary Emergency Contact: [name],Patricia Address: [id number]          [place] [address] [name] of [place] Phone: [phone] Mobile Phone: [phone] Relation: Sister Secondary Emergency Contact: [name],[name]  [name] of [place] Phone: [phone] Mobile Phone: [phone] Relation: Niece  Code Status:  FULL CODE  Goals of care: Advanced Directive information Advanced Directives 11/28/2021  Does Patient Have a Medical Advance Directive? Yes  Type of Paramedic of [place];Living will  Does patient want to make changes to medical advance directive? No - Patient declined  Copy of [place] in Chart? Yes - validated most recent copy scanned in chart (See row information)  Would patient like information on creating a medical advance directive? -     Chief Complaint  Patient presents with   Acute Visit    Impaired hearing    HPI:  Pt is a 87 y.o. female seen today for an acute visit for impaired hearing.   She currently resides on the assisted living unit at [facility]. [place] includes: aortic stenosis, CAD, HTN, PAF, GERD, confusion, scoliosis, anemia, and anxiety/depression.   Today, she reports decreased hearing to both ears. She believes her symptoms are related to her ears feeling "full" and the need to "pop" her ears. Denies cold symptoms, ear pain or trauma to ears. During our encounter she did ask me to " speak up" a few times. She cannot recall if she is watching TV at a louder volume.   Confusion- continues to require direction from staff on ADLs, recent lab work unremarkable, no behavioral  outbursts, MMSE pending, CT head 05/02/2021 noted atrophy with chronic small vessel disease CAD- hx sten placement 1992 and 2005, followed by Dr. Martinique, remains on aspirin/statin/metoprolol Grade 1 diastolic dysfunction- Echo 09/21/2020 LVEF 65-70% PAF- cardioversion 08/2020, remains on metoprolol for rate control, not on anticoagulants due to falls HTN- BUN/creat 12/0.4 11/24/2021, remains on amlodipine/HCTZ/losartan/metoprolol  HLD- LDL 51 09/17/2020, remains on atorvastatin Anemia- Hgb 12.3 11/24/2021, history of PRBC transfusion and IV iron infusions       Past Medical History:  Diagnosis Date   Anxiety and depression    Aortic stenosis    a. 03/2014: Mild aortic stenosis by valve area and mean gradient though appeared more visually consistent with moderate AS.    CAD (coronary artery disease)    a. s/p prior PCI of D2 in 1992 b. stent to RCA in 2005   Diverticula of colon    GERD (gastroesophageal reflux disease)    HTN (hypertension)    Hypercholesteremia    Lateral myocardial infarction ([place]) 1992   Osteoarthritis    Osteopenia    Scoliosis    Past Surgical History:  Procedure Laterality Date   ABDOMINAL HYSTERECTOMY     APPENDECTOMY     CORONARY ANGIOPLASTY     TONSILLECTOMY      Allergies  Allergen Reactions   Nifedipine Other (See Comments)    Dark Urine; can only take orange tablet, allergic to yellow-brown tablet   Tetanus Toxoids    Tetanus-Diphtheria Toxoids Td     Other reaction(s): Unknown   Adhesive [Tape] Rash  Allergies as of 11/28/2021       Reactions   Nifedipine Other (See Comments)   Dark Urine; can only take orange tablet, allergic to yellow-brown tablet   Tetanus Toxoids    Tetanus-diphtheria Toxoids Td    Other reaction(s): Unknown   Adhesive [tape] Rash        Medication List        Accurate as of November 28, 2021 11:14 AM. If you have any questions, ask your nurse or doctor.          STOP taking these medications     ferrous sulfate 325 (65 FE) MG tablet Stopped by: [name], NP       TAKE these medications    amLODipine 10 MG tablet Commonly known as: NORVASC Take 1 tablet (10 mg total) by mouth daily.   aspirin EC 81 MG tablet Take 1 tablet (81 mg total) by mouth daily. Swallow whole.   atorvastatin 40 MG tablet Commonly known as: LIPITOR TAKE 1 TABLET DAILY   cholecalciferol 1000 units tablet Commonly known as: VITAMIN D Take 1,000 Units by mouth 2 (two) times daily.   hydrochlorothiazide 25 MG tablet Commonly known as: HYDRODIURIL TAKE 1 TABLET DAILY   loratadine 10 MG tablet Commonly known as: CLARITIN Take 10 mg by mouth daily.   losartan 100 MG tablet Commonly known as: COZAAR Take 1 tablet (100 mg total) by mouth daily.   metoprolol succinate 50 MG 24 hr tablet Commonly known as: TOPROL-XL TAKE 1 TABLET EVERY DAY    WITH OR RIGHT AFTER A MEAL   nitroGLYCERIN 0.4 MG SL tablet Commonly known as: Nitrostat Place 1 tablet (0.4 mg total) under the tongue every 5 (five) minutes as needed for chest pain.        Review of Systems  Constitutional:  Negative for activity change, appetite change, chills, fatigue and fever.  HENT:  Positive for hearing loss. Negative for congestion, ear discharge, ear pain, sinus pressure, sinus pain, sore throat, tinnitus and trouble swallowing.        Ear fullness  Eyes:  Negative for photophobia and visual disturbance.  Respiratory:  Negative for cough, shortness of breath and wheezing.   Cardiovascular:  Negative for chest pain and leg swelling.  Gastrointestinal:  Negative for abdominal distention, abdominal pain, blood in stool, constipation, diarrhea, nausea and vomiting.  Genitourinary:  Negative for dysuria, frequency and hematuria.  Musculoskeletal:  Positive for gait problem. Negative for arthralgias and myalgias.  Skin: Negative.   Neurological:  Negative for dizziness, weakness and headaches.  Psychiatric/Behavioral:   Positive for confusion. Negative for dysphoric mood and sleep disturbance. The patient is not nervous/anxious.    Immunization History  Administered Date(s) Administered   Influenza Split 05/13/2009, 05/18/2010, 09/29/2011, 09/05/2012, 09/03/2013   Influenza, High Dose Seasonal PF 09/09/2015, 08/30/2017   Influenza, Quadrivalent, Recombinant, Inj, Pf 08/19/2018, 09/29/2019   Influenza-Unspecified 10/18/2021   Pneumococcal Conjugate-13 07/08/2013   Pneumococcal Polysaccharide-23 06/29/2006, 05/13/2009, 05/18/2010, 05/25/2011   Td 05/13/2009, 05/18/2010, 05/25/2011   Zoster Recombinat (Shingrix) 08/24/2017, 10/31/2017   Zoster, Live 08/22/2008, 08/24/2008, 05/13/2009, 05/18/2010, 05/25/2011   Pertinent  Health Maintenance Due  Topic Date Due   INFLUENZA VACCINE  Completed   DEXA SCAN  Completed   Fall Risk 09/22/2020 09/22/2020 12/15/2020 04/23/2021 05/02/2021  Patient Fall Risk Level High fall risk High fall risk Low fall risk Low fall risk High fall risk   Functional Status Survey:    Vitals:   11/28/21 1109  BP:  133/76  Pulse: 77  Resp: 18  Temp: 98 F (36.7 C)  SpO2: 93%  Weight: 121 lb 6.4 oz (55.1 kg)  Height: 5' (1.524 m)   Body mass index is 23.71 kg/m. Physical Exam Vitals reviewed.  Constitutional:      General: She is not in acute distress. HENT:     Head: Normocephalic.     Right Ear: There is no impacted cerumen.     Left Ear: There is no impacted cerumen.     Ears:     Comments: Negative whisper test    Nose: Nose normal.     Mouth/Throat:     Mouth: Mucous membranes are moist.  Eyes:     General:        Right eye: No discharge.        Left eye: No discharge.  Neck:     Vascular: No carotid bruit.  Cardiovascular:     Rate and Rhythm: Normal rate. Rhythm irregular.     Pulses: Normal pulses.     Heart sounds: Murmur heard.  Pulmonary:     Effort: Pulmonary effort is normal. No respiratory distress.     Breath sounds: Normal breath sounds. No  wheezing.  Abdominal:     General: Bowel sounds are normal. There is no distension.     Palpations: Abdomen is soft.     Tenderness: There is no abdominal tenderness.  Musculoskeletal:     Cervical back: Normal range of motion.     Right lower leg: No edema.     Left lower leg: No edema.  Lymphadenopathy:     Cervical: No cervical adenopathy.  Skin:    General: Skin is warm and dry.     Capillary Refill: Capillary refill takes less than 2 seconds.  Neurological:     General: No focal deficit present.     Mental Status: She is alert. Mental status is at baseline.     Motor: Weakness present.     Gait: Gait abnormal.  Psychiatric:        Mood and Affect: Mood normal.        Behavior: Behavior normal.    Labs reviewed: Recent Labs    05/02/21 1032 11/24/21 0000  NA 140 141  K 3.2* 4.4  CL 104 106  CO2 27 27*  GLUCOSE 93  --   BUN 15 12  CREATININE 0.59 0.4*  CALCIUM 8.9 8.3*   Recent Labs    05/02/21 1032 11/24/21 0000  AST 30 16  ALT 18 12  ALKPHOS 101 106  BILITOT 0.7  --   PROT 7.6  --   ALBUMIN 4.0 3.2*   Recent Labs    05/02/21 1032 11/24/21 0000  WBC 7.2 4.9  NEUTROABS 5.6  --   HGB 13.9 12.3  HCT 44.9 38  MCV 92.2  --   PLT 235 366   Lab Results  Component Value Date   TSH 2.90 11/24/2021   No results found for: HGBA1C Lab Results  Component Value Date   CHOL 138 09/17/2020   HDL 73 09/17/2020   LDLCALC 51 09/17/2020   TRIG 70 09/17/2020   CHOLHDL 1.9 09/17/2020    Significant Diagnostic Results in last 30 days:  No results found.  Assessment/Plan 1. Bilateral hearing loss, unspecified hearing loss type - negative whisper test - ear exam unremarkable- no wax buildup or bulging TM - reports ear "fullness" suspect maybe allergy related - discontinue claritin - start zyrtec 10  mg po daily - referral to audiology  2. Confusion - requiring more instruction to complete ADLs - no recent behavioral outbursts - MMSE- pending -CT  head 05/02/2021 noted atrophy with chronic small vessel disease  3. Coronary artery disease due to lipid rich plaque - followed by Dr. Martinique - cont ASA, statin and metoprolol  4. Grade I diastolic dysfunction - Echo 09/21/2020 LVEF 65-70%  5. PAF (paroxysmal atrial fibrillation) (HCC) - rate controlled with metoprolol - not on anticoagulation due to falls  6. Primary hypertension - controlled - cont amlodipine, HCTZ, losartan, metoprolol  7. Hypercholesteremia - LDL 51 2021 - cont statin  8. Symptomatic anemia - Hgb 12.3 11/24/2021  9. Aortic stenosis, mild - cont to monitor    Family/ staff Communication: plan discussed with patient and nurse  Labs/tests ordered: referral to audiology

## 2021-12-08 ENCOUNTER — Non-Acute Institutional Stay: Payer: Medicare Other | Admitting: Internal Medicine

## 2021-12-08 ENCOUNTER — Encounter: Payer: Self-pay | Admitting: Internal Medicine

## 2021-12-08 DIAGNOSIS — I10 Essential (primary) hypertension: Secondary | ICD-10-CM | POA: Diagnosis not present

## 2021-12-08 DIAGNOSIS — E7800 Pure hypercholesterolemia, unspecified: Secondary | ICD-10-CM | POA: Diagnosis not present

## 2021-12-08 DIAGNOSIS — I350 Nonrheumatic aortic (valve) stenosis: Secondary | ICD-10-CM

## 2021-12-08 DIAGNOSIS — F01B18 Vascular dementia, moderate, with other behavioral disturbance: Secondary | ICD-10-CM | POA: Diagnosis not present

## 2021-12-08 DIAGNOSIS — I480 Paroxysmal atrial fibrillation: Secondary | ICD-10-CM | POA: Diagnosis not present

## 2021-12-08 DIAGNOSIS — I2510 Atherosclerotic heart disease of native coronary artery without angina pectoris: Secondary | ICD-10-CM

## 2021-12-08 DIAGNOSIS — F339 Major depressive disorder, recurrent, unspecified: Secondary | ICD-10-CM

## 2021-12-08 DIAGNOSIS — I2583 Coronary atherosclerosis due to lipid rich plaque: Secondary | ICD-10-CM

## 2021-12-08 NOTE — Progress Notes (Addendum)
Location:   [facility] Room Number: 18 Place of Service:  ALF [phone]) Provider:  Veleta Miners MD  [name], MD  Patient Care Team: [name], MD as PCP - General (Internal Medicine) Martinique, Peter M, MD as PCP - Cardiology (Cardiology)  Extended Emergency Contact Information Primary Emergency Contact: [name],Patricia Address: [phone] [place]          [place] [address] [name] of [place] Phone: [phone] Mobile Phone: [phone] Relation: Sister Secondary Emergency Contact: [name],[name]  [name] of [place] Phone: [phone] Mobile Phone: [phone] Relation: Niece  Code Status:  Full Code Goals of care: Advanced Directive information Advanced Directives 12/08/2021  Does Patient Have a Medical Advance Directive? Yes  Type of Paramedic of [place];Living will  Does patient want to make changes to medical advance directive? No - Patient declined  Copy of [place] in Chart? Yes - validated most recent copy scanned in chart (See row information)  Would patient like information on creating a medical advance directive? -     Chief Complaint  Patient presents with   Acute Visit    Behaviors     HPI:  Pt is a 87 y.o. female seen today for an acute visit for Worsening Cognition and behaviors  Patient has h/o CAD s/p Angioplasty and Stenting in RCA HTN, HLD Moderate Aortic stenosis,  H/o PAF Underwent DCCV No Anticoagulation due to her h/o Falls and Acute Anemia Chronic Back pain Unstable Gait and Falls Anemia No GI work up   [place] recently and has been noticed to have Cognition issues Patient says she feels Foggy and worries that she cant remember anything Has sister here in [place] who is the POA She calls her at night when she is confused. Also has had few falls sinc eshe has been here Patient is Pleasantly confused. Answers appropriately   Very aware of her deficits Says she feels Depressed. Mostly due to her Memory issue   Past Medical History:  Diagnosis Date   Anxiety and depression    Aortic stenosis    a. 03/2014: Mild aortic stenosis by valve area and mean gradient though appeared more visually consistent with moderate AS.    CAD (coronary artery disease)    a. s/p prior PCI of D2 in 1992 b. stent to RCA in 2005   Diverticula of colon    GERD (gastroesophageal reflux disease)    HTN (hypertension)    Hypercholesteremia    Lateral myocardial infarction ([place]) 1992   Osteoarthritis    Osteopenia    Scoliosis    Past Surgical History:  Procedure Laterality Date   ABDOMINAL HYSTERECTOMY     APPENDECTOMY     CORONARY ANGIOPLASTY     TONSILLECTOMY      Allergies  Allergen Reactions   Nifedipine Other (See Comments)    Dark Urine; can only take orange tablet, allergic to yellow-brown tablet   Tetanus Toxoids    Tetanus-Diphtheria Toxoids Td     Other reaction(s): Unknown   Adhesive [Tape] Rash    Allergies as of 12/08/2021       Reactions   Nifedipine Other (See Comments)   Dark Urine; can only take orange tablet, allergic to yellow-brown tablet   Tetanus Toxoids    Tetanus-diphtheria Toxoids Td    Other reaction(s): Unknown   Adhesive [tape] Rash        Medication List  Accurate as of December 08, 2021 11:54 AM. If you have any questions, ask your nurse or doctor.          STOP taking these medications    loratadine 10 MG tablet Commonly known as: CLARITIN Stopped by: [name], MD       TAKE these medications    amLODipine 10 MG tablet Commonly known as: NORVASC Take 1 tablet (10 mg total) by mouth daily.   aspirin EC 81 MG tablet Take 1 tablet (81 mg total) by mouth daily. Swallow whole.   atorvastatin 40 MG tablet Commonly known as: LIPITOR TAKE 1 TABLET DAILY   cetirizine 10 MG tablet Commonly known as: ZYRTEC Take 10 mg by mouth daily.    cholecalciferol 1000 units tablet Commonly known as: VITAMIN D Take 1,000 Units by mouth 2 (two) times daily.   hydrochlorothiazide 25 MG tablet Commonly known as: HYDRODIURIL TAKE 1 TABLET DAILY   losartan 100 MG tablet Commonly known as: COZAAR Take 1 tablet (100 mg total) by mouth daily.   metoprolol succinate 50 MG 24 hr tablet Commonly known as: TOPROL-XL TAKE 1 TABLET EVERY DAY    WITH OR RIGHT AFTER A MEAL   nitroGLYCERIN 0.4 MG SL tablet Commonly known as: Nitrostat Place 1 tablet (0.4 mg total) under the tongue every 5 (five) minutes as needed for chest pain.        Review of Systems  Constitutional:  Negative for activity change and appetite change.  HENT: Negative.    Respiratory:  Negative for cough and shortness of breath.   Cardiovascular:  Negative for leg swelling.  Gastrointestinal:  Negative for constipation.  Genitourinary: Negative.   Musculoskeletal:  Positive for gait problem. Negative for arthralgias and myalgias.  Skin: Negative.   Neurological:  Positive for dizziness. Negative for weakness.  Psychiatric/Behavioral:  Positive for confusion. Negative for dysphoric mood and sleep disturbance.    Immunization History  Administered Date(s) Administered   Influenza Split 05/13/2009, 05/18/2010, 09/29/2011, 09/05/2012, 09/03/2013   Influenza, High Dose Seasonal PF 09/09/2015, 08/30/2017   Influenza, Quadrivalent, Recombinant, Inj, Pf 08/19/2018, 09/29/2019   Influenza-Unspecified 10/18/2021   Pneumococcal Conjugate-13 07/08/2013   Pneumococcal Polysaccharide-23 06/29/2006, 05/13/2009, 05/18/2010, 05/25/2011   Td 05/13/2009, 05/18/2010, 05/25/2011   Zoster Recombinat (Shingrix) 08/24/2017, 10/31/2017   Zoster, Live 08/22/2008, 08/24/2008, 05/13/2009, 05/18/2010, 05/25/2011   Pertinent  Health Maintenance Due  Topic Date Due   INFLUENZA VACCINE  Completed   DEXA SCAN  Completed   Fall Risk 09/22/2020 09/22/2020 12/15/2020 04/23/2021 05/02/2021   Patient Fall Risk Level High fall risk High fall risk Low fall risk Low fall risk High fall risk   Functional Status Survey:    Vitals:   12/08/21 1140  BP: 107/65  Pulse: 61  Resp: (!) 21  Temp: (!) 97 F (36.1 C)  SpO2: 94%  Weight: 121 lb 6.4 oz (55.1 kg)  Height: 5' (1.524 m)   Body mass index is 23.71 kg/m. Physical Exam Vitals reviewed.  Constitutional:      Appearance: Normal appearance.  HENT:     Head: Normocephalic.     Nose: Nose normal.     Mouth/Throat:     Mouth: Mucous membranes are moist.     Pharynx: Oropharynx is clear.  Eyes:     Pupils: Pupils are equal, round, and reactive to light.  Cardiovascular:     Rate and Rhythm: Normal rate and regular rhythm.     Pulses: Normal pulses.     Heart  sounds: Murmur heard.  Pulmonary:     Effort: Pulmonary effort is normal.     Breath sounds: Normal breath sounds.  Abdominal:     General: Abdomen is flat. Bowel sounds are normal.     Palpations: Abdomen is soft.  Musculoskeletal:        General: No swelling.     Cervical back: Neck supple.  Skin:    General: Skin is warm.  Neurological:     General: No focal deficit present.     Mental Status: She is alert.  Psychiatric:        Mood and Affect: Mood normal.        Thought Content: Thought content normal.    Labs reviewed: Recent Labs    05/02/21 1032 11/24/21 0000  NA 140 141  K 3.2* 4.4  CL 104 106  CO2 27 27*  GLUCOSE 93  --   BUN 15 12  CREATININE 0.59 0.4*  CALCIUM 8.9 8.3*   Recent Labs    05/02/21 1032 11/24/21 0000  AST 30 16  ALT 18 12  ALKPHOS 101 106  BILITOT 0.7  --   PROT 7.6  --   ALBUMIN 4.0 3.2*   Recent Labs    05/02/21 1032 11/24/21 0000  WBC 7.2 4.9  NEUTROABS 5.6  --   HGB 13.9 12.3  HCT 44.9 38  MCV 92.2  --   PLT 235 366   Lab Results  Component Value Date   TSH 2.90 11/24/2021   No results found for: HGBA1C Lab Results  Component Value Date   CHOL 138 09/17/2020   HDL 73 09/17/2020    LDLCALC 51 09/17/2020   TRIG 70 09/17/2020   CHOLHDL 1.9 09/17/2020    Significant Diagnostic Results in last 30 days:  No results found.  Assessment/Plan 1. Primary hypertension BP here running in lower side With her falls and Dizziness will reduce her Norvasc to 5 mg  Also on Toprol and Cozaar and HCTZ  2. Moderate Vascular  dementia with other behavioral disturbance CT scan in 05/22 showed Chronic Vascular changes MMSE here is 16/30 Missed Recall and Orientation questions Working with Speech Will start on Zyprexa 2.5 mg QHS to help with her sundowning Also start Zoloft  Reval  3. Coronary artery disease due to lipid rich plaque On Statin, Aspirin and Beta blocker Follows with Cardiology 4. PAF (paroxysmal atrial fibrillation) (HCC) S/p Cardioversion Not on any Anticoagulation due to Falls and ANemia  5. Hypercholesteremia On statin   6. Moderate AS Asymptomatic   7. Depression, recurrent ([place]) Start on Zoloft 12.5 mg QD then increase to 25 mg QD BMP in 2 weeks    Family/ staff Communication:   Labs/tests ordered:   BMP in 2 weeks

## 2021-12-14 ENCOUNTER — Emergency Department (HOSPITAL_COMMUNITY)
Admission: EM | Admit: 2021-12-14 | Discharge: 2021-12-14 | Disposition: A | Discharge status: Home / Self Care | Payer: Medicare Other | Attending: Emergency Medicine | Admitting: Emergency Medicine

## 2021-12-14 ENCOUNTER — Encounter (HOSPITAL_COMMUNITY): Payer: Self-pay

## 2021-12-14 ENCOUNTER — Other Ambulatory Visit: Payer: Self-pay

## 2021-12-14 ENCOUNTER — Emergency Department (HOSPITAL_COMMUNITY): Payer: Medicare Other

## 2021-12-14 DIAGNOSIS — M47816 Spondylosis without myelopathy or radiculopathy, lumbar region: Secondary | ICD-10-CM | POA: Diagnosis not present

## 2021-12-14 DIAGNOSIS — I10 Essential (primary) hypertension: Secondary | ICD-10-CM | POA: Insufficient documentation

## 2021-12-14 DIAGNOSIS — M545 Low back pain, unspecified: Secondary | ICD-10-CM | POA: Diagnosis not present

## 2021-12-14 DIAGNOSIS — S61511A Laceration without foreign body of right wrist, initial encounter: Secondary | ICD-10-CM

## 2021-12-14 DIAGNOSIS — Z7982 Long term (current) use of aspirin: Secondary | ICD-10-CM | POA: Diagnosis not present

## 2021-12-14 DIAGNOSIS — S0093XA Contusion of unspecified part of head, initial encounter: Secondary | ICD-10-CM | POA: Diagnosis not present

## 2021-12-14 DIAGNOSIS — Z79899 Other long term (current) drug therapy: Secondary | ICD-10-CM | POA: Diagnosis not present

## 2021-12-14 DIAGNOSIS — Y92009 Unspecified place in unspecified non-institutional (private) residence as the place of occurrence of the external cause: Secondary | ICD-10-CM | POA: Insufficient documentation

## 2021-12-14 DIAGNOSIS — W010XXA Fall on same level from slipping, tripping and stumbling without subsequent striking against object, initial encounter: Secondary | ICD-10-CM | POA: Diagnosis not present

## 2021-12-14 DIAGNOSIS — Z955 Presence of coronary angioplasty implant and graft: Secondary | ICD-10-CM | POA: Diagnosis not present

## 2021-12-14 DIAGNOSIS — R0781 Pleurodynia: Secondary | ICD-10-CM | POA: Diagnosis not present

## 2021-12-14 DIAGNOSIS — S61512A Laceration without foreign body of left wrist, initial encounter: Secondary | ICD-10-CM | POA: Diagnosis not present

## 2021-12-14 DIAGNOSIS — I2510 Atherosclerotic heart disease of native coronary artery without angina pectoris: Secondary | ICD-10-CM | POA: Diagnosis not present

## 2021-12-14 DIAGNOSIS — Z87891 Personal history of nicotine dependence: Secondary | ICD-10-CM | POA: Insufficient documentation

## 2021-12-14 DIAGNOSIS — S299XXA Unspecified injury of thorax, initial encounter: Secondary | ICD-10-CM | POA: Diagnosis not present

## 2021-12-14 DIAGNOSIS — S20219A Contusion of unspecified front wall of thorax, initial encounter: Secondary | ICD-10-CM | POA: Insufficient documentation

## 2021-12-14 DIAGNOSIS — R58 Hemorrhage, not elsewhere classified: Secondary | ICD-10-CM | POA: Diagnosis not present

## 2021-12-14 DIAGNOSIS — W19XXXA Unspecified fall, initial encounter: Secondary | ICD-10-CM

## 2021-12-14 DIAGNOSIS — T07XXXA Unspecified multiple injuries, initial encounter: Secondary | ICD-10-CM

## 2021-12-14 MED ORDER — ACETAMINOPHEN 325 MG PO TABS
650.0000 mg | ORAL_TABLET | Freq: Once | ORAL | Status: AC
  Administered 2021-12-14: 17:00:00 650 mg via ORAL
  Filled 2021-12-14: qty 2

## 2021-12-14 MED ORDER — LIDOCAINE HCL (PF) 1 % IJ SOLN
10.0000 mL | Freq: Once | INTRAMUSCULAR | Status: AC
  Administered 2021-12-14: 14:00:00 10 mL
  Filled 2021-12-14: qty 30

## 2021-12-14 NOTE — ED Triage Notes (Signed)
Pt to er room number 15 via ems, [facility], pt states that she bumped into the lamp and it fell and she tried to catch it and fell cutting her R arm, pt denies hitting her head, denies loc.  Pt awake and oriented to person, place, doesn't know day of the week, knows that it is December.  Pt states that "I don't have any reason to know the date".  Pt denies pain.

## 2021-12-14 NOTE — ED Notes (Signed)
[place], talked with Christa at the assisted living section, reviewed d/c instructions and results, verbalized understanding, pt's sister at bedside and will take pt home

## 2021-12-14 NOTE — Discharge Instructions (Signed)
Remove the bandage from your right wrist in 1 or 2 days after that clean the wound daily with soap and water then apply a light bandage.  Have the nurse or a doctor remove your stitches in about 10 days.  For pain, take Tylenol 650 mg every 4 hours.  Use ice on sore areas 3-4 times a day for 2 days after that heat can help.  Follow-up with your doctor or return here as needed for problems.

## 2021-12-14 NOTE — ED Notes (Signed)
Pt in bed, pt has aprox two inch lac/skin tear to her R forearm.  Bleeding is stopped at this time. Pt moving all extremities, pt has full rom of hand, strong radial pulse, less than three sec cap refill, denies hitting her head or loc.

## 2021-12-14 NOTE — ED Notes (Signed)
Md at bedside for lac repair

## 2021-12-14 NOTE — ED Provider Notes (Signed)
[place] DEPT Provider Note   CSN: [id number] Arrival date & time: 12/14/21  1204     History Chief Complaint  Patient presents with   [name] is a 87 y.o. female.  HPI She is here for evaluation of injury that occurred at her home/apartment when she tripped and fell after bumping into a lamp.  She had injury to her right ribs and her right wrist.  No recent injuries.  No complaint of headache, neck pain or back pain.  She is lucid and at her baseline per her sister who is with her.    Past Medical History:  Diagnosis Date   Anxiety and depression    Aortic stenosis    a. 03/2014: Mild aortic stenosis by valve area and mean gradient though appeared more visually consistent with moderate AS.    CAD (coronary artery disease)    a. s/p prior PCI of D2 in 1992 b. stent to RCA in 2005   Diverticula of colon    GERD (gastroesophageal reflux disease)    HTN (hypertension)    Hypercholesteremia    Lateral myocardial infarction [facility]) 1992   Osteoarthritis    Osteopenia    Scoliosis     Patient Active Problem List   Diagnosis Date Noted   Confusion 11/23/2021   Grade I diastolic dysfunction 85/88/5027   Elevated troponin    Symptomatic anemia 09/20/2020   PAF (paroxysmal atrial fibrillation) ([place]) 09/20/2020   Dizzy 06/05/2013   Aortic stenosis, mild    CAD (coronary artery disease)    HTN (hypertension)    Hypercholesteremia    Anxiety and depression    Lateral myocardial infarction ([place])    Scoliosis    GERD 06/09/2009   PERSONAL HISTORY OF COLONIC POLYPS 06/09/2009    Past Surgical History:  Procedure Laterality Date   ABDOMINAL HYSTERECTOMY     APPENDECTOMY     CORONARY ANGIOPLASTY     TONSILLECTOMY       OB History   No obstetric history on file.     Family History  Problem Relation Age of Onset   Hypertension Father    Heart failure Mother    Hypertension Mother    Cancer Brother     Social  History   Tobacco Use   Smoking status: Former    Types: Cigarettes    Quit date: 12/26/1991    Years since quitting: 29.9   Smokeless tobacco: Never  Vaping Use   Vaping Use: Never used  Substance Use Topics   Alcohol use: No    Alcohol/week: 0.0 standard drinks   Drug use: Never    Home Medications Prior to Admission medications   Medication Sig Start Date End Date Taking? Authorizing Provider  amLODipine (NORVASC) 10 MG tablet Take 1 tablet (10 mg total) by mouth daily. Patient taking differently: Take 5 mg by mouth daily. 09/23/20   Amin, Jeanella Flattery, MD  aspirin EC 81 MG tablet Take 1 tablet (81 mg total) by mouth daily. Swallow whole. 09/29/20   [name] E, PA-C  atorvastatin (LIPITOR) 40 MG tablet TAKE 1 TABLET DAILY 07/07/20   Martinique, Peter M, MD  cetirizine (ZYRTEC) 10 MG tablet Take 10 mg by mouth daily.    [provider]  cholecalciferol (VITAMIN D) 1000 UNITS tablet Take 1,000 Units by mouth 2 (two) times daily.    [provider]  hydrochlorothiazide (HYDRODIURIL) 25 MG tablet TAKE 1 TABLET DAILY 02/14/21  Martinique, Peter M, MD  losartan (COZAAR) 100 MG tablet Take 1 tablet (100 mg total) by mouth daily. 09/17/20 12/08/21  Martinique, Peter M, MD  metoprolol succinate (TOPROL-XL) 50 MG 24 hr tablet TAKE 1 TABLET EVERY DAY    WITH OR RIGHT AFTER A MEAL 03/02/21   Martinique, Peter M, MD  nitroGLYCERIN (NITROSTAT) 0.4 MG SL tablet Place 1 tablet (0.4 mg total) under the tongue every 5 (five) minutes as needed for chest pain. 09/23/20   Martinique, Peter M, MD    Allergies    Nifedipine, Tetanus toxoids, Tetanus-diphtheria toxoids td, and Adhesive [tape]  Review of Systems   Review of Systems  All other systems reviewed and are negative.  Physical Exam Updated Vital Signs BP (!) 157/75 (BP Location: Left Arm)    Pulse (!) 52    Temp 97.8 F (36.6 C) (Oral)    Resp 20    Ht 5\' 4"  (1.626 m)    Wt 56.7 kg    SpO2 96%    BMI 21.46 kg/m   Physical Exam Vitals  and nursing note reviewed.  Constitutional:      General: She is not in acute distress.    Appearance: She is well-developed. She is not ill-appearing, toxic-appearing or diaphoretic.  HENT:     Head: Normocephalic and atraumatic.     Right Ear: External ear normal.     Left Ear: External ear normal.  Eyes:     Conjunctiva/sclera: Conjunctivae normal.     Pupils: Pupils are equal, round, and reactive to light.  Neck:     Trachea: Phonation normal.  Cardiovascular:     Rate and Rhythm: Normal rate.  Pulmonary:     Effort: Pulmonary effort is normal.  Chest:     Chest wall: Tenderness (mild chest wall tenderness without crepitation or deformity.) present.  Abdominal:     General: There is no distension.     Palpations: Abdomen is soft.     Tenderness: There is no abdominal tenderness.  Musculoskeletal:        General: Normal range of motion.     Cervical back: Normal range of motion and neck supple.     Comments: Lateral wrist laceration gaping, bleeding somewhat.  Neurovascular intact and motion intact distally.  Skin:    General: Skin is warm and dry.  Neurological:     Mental Status: She is alert and oriented to person, place, and time.     Cranial Nerves: No cranial nerve deficit.     Sensory: No sensory deficit.     Motor: No abnormal muscle tone.     Coordination: Coordination normal.  Psychiatric:        Mood and Affect: Mood normal.        Behavior: Behavior normal.        Thought Content: Thought content normal.        Judgment: Judgment normal.    ED Results / Procedures / Treatments   Labs (all labs ordered are listed, but only abnormal results are displayed) Labs Reviewed - No data to display  EKG None  Radiology No results found.  Procedures .[name] KitchenLaceration Repair  Date/Time: 12/14/2021 2:35 PM Performed by: Daleen Bo, MD Authorized by: Daleen Bo, MD   Consent:    Consent obtained:  Verbal   Risks discussed:  Infection and  pain Universal protocol:    Procedure explained and questions answered to patient or proxy's satisfaction: yes     Immediately prior to procedure, a  time out was called: yes     Patient identity confirmed:  Verbally with patient Anesthesia:    Anesthesia method:  Local infiltration   Local anesthetic:  Lidocaine 1% w/o epi Laceration details:    Location: Right distal forearm.   Length (cm):  4   Depth (mm):  4 Pre-procedure details:    Preparation:  Patient was prepped and draped in usual sterile fashion Exploration:    Limited defect created (wound extended): no     Hemostasis achieved with:  Direct pressure   Wound extent: no fascia violation noted, no foreign bodies/material noted, no muscle damage noted, no nerve damage noted, no tendon damage noted, no underlying fracture noted and no vascular damage noted     Contaminated: no   Treatment:    Area cleansed with:  Povidone-iodine   Irrigation solution:  Sterile water   Irrigation method:  Syringe   Visualized foreign bodies/material removed: no     Debridement:  None   Undermining:  None Skin repair:    Repair method:  Sutures   Suture size:  4-0   Suture material:  Prolene   Suture technique:  Simple interrupted Approximation:    Approximation:  Loose Repair type:    Repair type:  Simple Post-procedure details:    Dressing:  Non-adherent dressing   Procedure completion:  Tolerated well, no immediate complications   Medications Ordered in ED Medications - No data to display  ED Course  I have reviewed the triage vital signs and the nursing notes.  Pertinent labs & imaging results that were available during my care of the patient were reviewed by me and considered in my medical decision making (see chart for details).    MDM Rules/Calculators/A&P                          No data found.  At the time of discharge- reevaluation with update and discussion. After initial assessment and treatment, an updated  evaluation reveals she is comfortable and has no further complaints.  Findings discussed with patient and sister, all questions answered. Daleen Bo   Medical Decision Making:  This patient is presenting for evaluation of mechanical fall with injuries, which does require a range of treatment options, and is a complaint that involves a moderate risk of morbidity and mortality. The differential diagnoses include fracture, contusion, visceral injury, soft tissue injury. I decided to review old records, and in summary elderly female with accidental fall, likely mechanical.  She is elderly and frail.  I obtained additional historical information from sister at bedside.   Radiologic Tests Ordered, included radiography ribs, right, chest, lumbar spine.  I independently Visualized: Radiographic images, which show no acute abnormalities    Critical Interventions-clinical evaluation, radiography, laceration repair right wrist, discussion with family member at the bedside  After These Interventions, the Patient was reevaluated and was found improved, stable for discharge.  No indication for further ED intervention or hospitalization  CRITICAL CARE-no Performed by: Daleen Bo        Final Clinical Impression(s) / ED Diagnoses Final diagnoses:  Fall, initial encounter  Laceration of left wrist, initial encounter  Contusion, multiple sites    Nursing Notes Reviewed/ Care Coordinated Applicable Imaging Reviewed Interpretation of Laboratory Data incorporated into ED treatment  The patient appears reasonably screened and/or stabilized for discharge and I doubt any other medical condition or other [facility] requiring further screening, evaluation, or treatment in the ED at this  time prior to discharge.  Plan: Home Medications-usual; Home Treatments-wound care at home; return here if the recommended treatment, does not improve the symptoms; Recommended follow up-suture removal 7 to 10  days   Rx / DC Orders ED Discharge Orders     None        Daleen Bo, MD 12/19/21 [phone]

## 2021-12-14 NOTE — ED Notes (Signed)
Pt dressed and ready to go home, verbalized understanding d/c instructions and follow up, pt requests some tylenol. Tylenol given.  Pt from dpt via wc.

## 2021-12-15 ENCOUNTER — Encounter: Payer: Self-pay | Admitting: Internal Medicine

## 2021-12-15 ENCOUNTER — Non-Acute Institutional Stay: Payer: Medicare Other | Admitting: Internal Medicine

## 2021-12-15 DIAGNOSIS — I480 Paroxysmal atrial fibrillation: Secondary | ICD-10-CM | POA: Diagnosis not present

## 2021-12-15 DIAGNOSIS — I10 Essential (primary) hypertension: Secondary | ICD-10-CM | POA: Diagnosis not present

## 2021-12-15 DIAGNOSIS — R296 Repeated falls: Secondary | ICD-10-CM | POA: Diagnosis not present

## 2021-12-15 DIAGNOSIS — F01B18 Vascular dementia, moderate, with other behavioral disturbance: Secondary | ICD-10-CM

## 2021-12-15 NOTE — Progress Notes (Signed)
Location:   [place] Room Number: 18 Place of Service:  ALF [phone]) Provider:  Veleta Miners MD  [name], MD  Patient Care Team: [name], MD as PCP - General (Internal Medicine) Martinique, Peter M, MD as PCP - Cardiology (Cardiology)  Extended Emergency Contact Information Primary Emergency Contact: [name],Patricia Address: [phone]           [address] [name] [place] Phone: [phone] Mobile Phone: [phone] Relation: Sister Secondary Emergency Contact: [name],[name]  [name] of [place] Phone: [phone] Mobile Phone: [phone] Relation: Niece  Code Status:  Full Code Goals of care: Advanced Directive information Advanced Directives 12/15/2021  Does Patient Have a Medical Advance Directive? Yes  Type of Paramedic of [place];Living will  Does patient want to make changes to medical advance directive? No - Patient declined  Copy of [place] in Chart? Yes - validated most recent copy scanned in chart (See row information)  Would patient like information on creating a medical advance directive? -     Chief Complaint  Patient presents with   Acute Visit    Falls     HPI:  Pt is a 87 y.o. female seen today for an acute visit for ED visit after falls  Patient has h/o CAD s/p Angioplasty and Stenting in RCA HTN, HLD Moderate Aortic stenosis,  H/o PAF Underwent DCCV No Anticoagulation due to her h/o Falls and Acute Anemia Chronic Back pain Unstable Gait and Falls Anemia No GI work up    Patient has had Number of Falls since been in [place] Per Nurses she forgets to walk with her walker Had to go to ED for Laceration Back in SNF Denies Fever Cough or Dizziness Mental status at baseline   Patient has moved to [place] recently and has been noticed to have Cognition issues Patient says she feels Foggy and worries that she cant remember  anything Has sister here in [place] who is the POA She calls her at night when she is confused. Also has had few falls sinc eshe has been here Patient is Pleasantly confused. Answers appropriately  Very aware of her deficits Says she feels Depressed. Mostly due to her Memory issue  Past Medical History:  Diagnosis Date   Anxiety and depression    Aortic stenosis    a. 03/2014: Mild aortic stenosis by valve area and mean gradient though appeared more visually consistent with moderate AS.    CAD (coronary artery disease)    a. s/p prior PCI of D2 in 1992 b. stent to RCA in 2005   Diverticula of colon    GERD (gastroesophageal reflux disease)    HTN (hypertension)    Hypercholesteremia    Lateral myocardial infarction ([place]) 1992   Osteoarthritis    Osteopenia    Scoliosis    Past Surgical History:  Procedure Laterality Date   ABDOMINAL HYSTERECTOMY     APPENDECTOMY     CORONARY ANGIOPLASTY     TONSILLECTOMY      Allergies  Allergen Reactions   Nifedipine Other (See Comments)    Dark Urine; can only take orange tablet, allergic to yellow-brown tablet   Tetanus Toxoids    Tetanus-Diphtheria Toxoids Td     Other reaction(s): Unknown   Adhesive [Tape] Rash    Allergies as of 12/15/2021       Reactions   Nifedipine Other (See Comments)   Dark Urine; can only take  orange tablet, allergic to yellow-brown tablet   Tetanus Toxoids    Tetanus-diphtheria Toxoids Td    Other reaction(s): Unknown   Adhesive [tape] Rash        Medication List        Accurate as of December 15, 2021 12:38 PM. If you have any questions, ask your nurse or doctor.          STOP taking these medications    cetirizine 10 MG tablet Commonly known as: ZYRTEC Stopped by: [name], MD       TAKE these medications    acetaminophen 325 MG tablet Commonly known as: TYLENOL Take 650 mg by mouth every 4 (four) hours as needed.   amLODipine 5 MG tablet Commonly known as:  NORVASC Take 5 mg by mouth daily. What changed: Another medication with the same name was removed. Continue taking this medication, and follow the directions you see here. Changed by: [name], MD   aspirin EC 81 MG tablet Take 1 tablet (81 mg total) by mouth daily. Swallow whole.   atorvastatin 40 MG tablet Commonly known as: LIPITOR TAKE 1 TABLET DAILY   cholecalciferol 1000 units tablet Commonly known as: VITAMIN D Take 1,000 Units by mouth 2 (two) times daily.   hydrochlorothiazide 25 MG tablet Commonly known as: HYDRODIURIL TAKE 1 TABLET DAILY   losartan 100 MG tablet Commonly known as: COZAAR Take 1 tablet (100 mg total) by mouth daily.   metoprolol succinate 50 MG 24 hr tablet Commonly known as: TOPROL-XL TAKE 1 TABLET EVERY DAY    WITH OR RIGHT AFTER A MEAL   nitroGLYCERIN 0.4 MG SL tablet Commonly known as: Nitrostat Place 1 tablet (0.4 mg total) under the tongue every 5 (five) minutes as needed for chest pain.   OLANZapine 2.5 MG tablet Commonly known as: ZYPREXA Take 2.5 mg by mouth at bedtime.   sertraline 25 MG tablet Commonly known as: ZOLOFT Take 25 mg by mouth at bedtime.   sertraline 25 MG tablet Commonly known as: ZOLOFT Take 12.5 mg by mouth at bedtime.        Review of Systems  Constitutional:  Negative for activity change and appetite change.  HENT: Negative.    Respiratory:  Negative for cough and shortness of breath.   Cardiovascular:  Negative for leg swelling.  Gastrointestinal:  Negative for constipation.  Genitourinary: Negative.   Musculoskeletal:  Positive for gait problem. Negative for arthralgias and myalgias.  Skin: Negative.   Neurological:  Negative for dizziness and weakness.  Psychiatric/Behavioral:  Positive for confusion. Negative for dysphoric mood and sleep disturbance.    Immunization History  Administered Date(s) Administered   Influenza Split 05/13/2009, 05/18/2010, 09/29/2011, 09/05/2012, 09/03/2013    Influenza, High Dose Seasonal PF 09/09/2015, 08/30/2017   Influenza, Quadrivalent, Recombinant, Inj, Pf 08/19/2018, 09/29/2019   Influenza-Unspecified 10/18/2021   Pneumococcal Conjugate-13 07/08/2013   Pneumococcal Polysaccharide-23 06/29/2006, 05/13/2009, 05/18/2010, 05/25/2011   Td 05/13/2009, 05/18/2010, 05/25/2011   Zoster Recombinat (Shingrix) 08/24/2017, 10/31/2017   Zoster, Live 08/22/2008, 08/24/2008, 05/13/2009, 05/18/2010, 05/25/2011   Pertinent  Health Maintenance Due  Topic Date Due   INFLUENZA VACCINE  Completed   DEXA SCAN  Completed   Fall Risk 09/22/2020 12/15/2020 04/23/2021 05/02/2021 12/14/2021  Patient Fall Risk Level High fall risk Low fall risk Low fall risk High fall risk Low fall risk   Functional Status Survey:    Vitals:   12/15/21 1141  BP: (!) 152/76  Pulse: (!) 56  Resp: 18  Temp: (!) 97 F (36.1 C)  SpO2: 96%  Weight: 121 lb 6.4 oz (55.1 kg)  Height: 5\' 4"  (1.626 m)   Body mass index is 20.84 kg/m. Physical Exam Vitals reviewed.  Constitutional:      Appearance: Normal appearance.  HENT:     Head: Normocephalic.     Nose: Nose normal.     Mouth/Throat:     Mouth: Mucous membranes are moist.     Pharynx: Oropharynx is clear.  Eyes:     Pupils: Pupils are equal, round, and reactive to light.  Cardiovascular:     Rate and Rhythm: Normal rate and regular rhythm.     Pulses: Normal pulses.     Heart sounds: Murmur heard.  Pulmonary:     Effort: Pulmonary effort is normal.     Breath sounds: Normal breath sounds.  Abdominal:     General: Abdomen is flat. Bowel sounds are normal.     Palpations: Abdomen is soft.  Musculoskeletal:        General: No swelling.     Cervical back: Neck supple.  Skin:    General: Skin is warm.  Neurological:     General: No focal deficit present.     Mental Status: She is alert.  Psychiatric:        Mood and Affect: Mood normal.        Thought Content: Thought content normal.    Labs reviewed: Recent  Labs    05/02/21 1032 11/24/21 0000  NA 140 141  K 3.2* 4.4  CL 104 106  CO2 27 27*  GLUCOSE 93  --   BUN 15 12  CREATININE 0.59 0.4*  CALCIUM 8.9 8.3*   Recent Labs    05/02/21 1032 11/24/21 0000  AST 30 16  ALT 18 12  ALKPHOS 101 106  BILITOT 0.7  --   PROT 7.6  --   ALBUMIN 4.0 3.2*   Recent Labs    05/02/21 1032 11/24/21 0000  WBC 7.2 4.9  NEUTROABS 5.6  --   HGB 13.9 12.3  HCT 44.9 38  MCV 92.2  --   PLT 235 366   Lab Results  Component Value Date   TSH 2.90 11/24/2021   No results found for: HGBA1C Lab Results  Component Value Date   CHOL 138 09/17/2020   HDL 73 09/17/2020   LDLCALC 51 09/17/2020   TRIG 70 09/17/2020   CHOLHDL 1.9 09/17/2020    Significant Diagnostic Results in last 30 days:  DG Ribs Unilateral W/Chest Right  Result Date: 12/14/2021 CLINICAL DATA:  Pain, fall EXAM: RIGHT RIBS AND CHEST - 3+ VIEW COMPARISON:  05/02/2021 FINDINGS: No acute displaced fracture or other bone lesions are seen involving the ribs. Chronic, callused fracture deformities of the left third through sixth ribs, seen acutely on prior CT. There is no evidence of pneumothorax or pleural effusion. Cardiomegaly status post median sternotomy. Pulmonary vascular prominence without overt edema. IMPRESSION: 1. No acute displaced rib fracture. 2. Chronic, callused fractures of the left third through sixth ribs, as seen acutely on prior CT. 3. Cardiomegaly and pulmonary vascular prominence without overt edema. Electronically Signed   By: Delanna Ahmadi M.D.   On: 12/14/2021 15:46   DG Lumbar Spine 2-3 Views  Result Date: 12/14/2021 CLINICAL DATA:  Provided history: Pain. Additional history provided: Fall earlier today. Patient endorses anterior rib pain and generalized low back pain. EXAM: LUMBAR SPINE - 2-3 VIEW COMPARISON:  Lumbar spine MRI 08/21/2014.  CT chest 05/02/2021. FINDINGS: Five lumbar vertebrae 5 lumbar vertebrae. The caudal most well-formed intervertebral disc  space is designated L5-S1. Prominent lumbar levocurvature. L2-L3 grade 1 retrolisthesis. L4-L5 grade 1 anterolisthesis. No lumbar spine fracture is identified. Lumbar spondylosis with multilevel disc space narrowing. Most notably, disc space narrowing is moderate/advanced at L1-L2, L3-L4 and L5-S1. Multilevel degenerative endplate spurring and degenerative endplate sclerosis. Facet arthrosis, greatest within the lower lumbar spine. Aortic atherosclerosis. IMPRESSION: No radiographic evidence of acute fracture to the lumbar spine. A lumbar spine CT may be obtained for further evaluation, as clinically warranted. Lumbar spondylosis, as described. Prominent lumbar levocurvature. L2-L3 grade 1 retrolisthesis. L4-L5 grade 1 anterolisthesis. Aortic Atherosclerosis (ICD10-I70.0). Electronically Signed   By: Kellie Simmering D.O.   On: 12/14/2021 15:42    Assessment/Plan  Recurrent falls Due to Unstable gait and her dementia Referal To therapy made Primary hypertension Norvasc reduced BP High since then But will continue Passive hypertension due to falls Also on Toprol and Cozaar and HCTZ   Moderate vascular dementia with other behavioral disturbance CT scan in 05/22 showed Chronic Vascular changes MMSE here is 16/30 Missed Recall and Orientation questions Failed Clock Working with Speech on Zyprexa 2.5 mg QHS to help with her sundowning Also start Zoloft  Reval  Coronary artery disease due to lipid rich plaque On Statin, Aspirin and Beta blocker Follows with Cardiology PAF (paroxysmal atrial fibrillation) ([place]) S/p Cardioversion Not on any Anticoagulation due to Falls and ANemia    Hypercholesteremia On statin     Moderate AS Asymptomatic     Depression, recurrent ([place]) Start on Zoloft 12.5 mg QD then increase to 25 mg QD BMP in 2 weeks   Family/ staff Communication:   Labs/tests ordered:

## 2021-12-22 DIAGNOSIS — I10 Essential (primary) hypertension: Secondary | ICD-10-CM | POA: Diagnosis not present

## 2022-01-11 DIAGNOSIS — H6502 Acute serous otitis media, left ear: Secondary | ICD-10-CM | POA: Diagnosis not present

## 2022-01-11 DIAGNOSIS — H6982 Other specified disorders of Eustachian tube, left ear: Secondary | ICD-10-CM | POA: Diagnosis not present

## 2022-01-11 DIAGNOSIS — H90A32 Mixed conductive and sensorineural hearing loss, unilateral, left ear with restricted hearing on the contralateral side: Secondary | ICD-10-CM | POA: Diagnosis not present

## 2022-01-23 DIAGNOSIS — E785 Hyperlipidemia, unspecified: Secondary | ICD-10-CM | POA: Diagnosis not present

## 2022-01-23 LAB — LIPID PANEL
Cholesterol: 86 (ref 0–200)
HDL: 43 (ref 35–70)
LDL Cholesterol: 27
LDl/HDL Ratio: 2
Triglycerides: 77 (ref 40–160)

## 2022-02-03 DIAGNOSIS — Z9181 History of falling: Secondary | ICD-10-CM | POA: Diagnosis not present

## 2022-02-03 DIAGNOSIS — M6389 Disorders of muscle in diseases classified elsewhere, multiple sites: Secondary | ICD-10-CM | POA: Diagnosis not present

## 2022-02-03 DIAGNOSIS — R2681 Unsteadiness on feet: Secondary | ICD-10-CM | POA: Diagnosis not present

## 2022-02-03 DIAGNOSIS — M810 Age-related osteoporosis without current pathological fracture: Secondary | ICD-10-CM | POA: Diagnosis not present

## 2022-02-03 DIAGNOSIS — R296 Repeated falls: Secondary | ICD-10-CM | POA: Diagnosis not present

## 2022-02-03 DIAGNOSIS — R278 Other lack of coordination: Secondary | ICD-10-CM | POA: Diagnosis not present

## 2022-02-03 DIAGNOSIS — M6281 Muscle weakness (generalized): Secondary | ICD-10-CM | POA: Diagnosis not present

## 2022-02-06 DIAGNOSIS — M810 Age-related osteoporosis without current pathological fracture: Secondary | ICD-10-CM | POA: Diagnosis not present

## 2022-02-06 DIAGNOSIS — R296 Repeated falls: Secondary | ICD-10-CM | POA: Diagnosis not present

## 2022-02-06 DIAGNOSIS — M6281 Muscle weakness (generalized): Secondary | ICD-10-CM | POA: Diagnosis not present

## 2022-02-06 DIAGNOSIS — Z9181 History of falling: Secondary | ICD-10-CM | POA: Diagnosis not present

## 2022-02-06 DIAGNOSIS — R2681 Unsteadiness on feet: Secondary | ICD-10-CM | POA: Diagnosis not present

## 2022-02-06 DIAGNOSIS — M6389 Disorders of muscle in diseases classified elsewhere, multiple sites: Secondary | ICD-10-CM | POA: Diagnosis not present

## 2022-02-07 ENCOUNTER — Encounter: Payer: Self-pay | Admitting: Nurse Practitioner

## 2022-02-07 ENCOUNTER — Non-Acute Institutional Stay: Payer: Medicare Other | Admitting: Nurse Practitioner

## 2022-02-07 DIAGNOSIS — M6281 Muscle weakness (generalized): Secondary | ICD-10-CM | POA: Diagnosis not present

## 2022-02-07 DIAGNOSIS — I10 Essential (primary) hypertension: Secondary | ICD-10-CM

## 2022-02-07 DIAGNOSIS — F339 Major depressive disorder, recurrent, unspecified: Secondary | ICD-10-CM | POA: Diagnosis not present

## 2022-02-07 DIAGNOSIS — I5189 Other ill-defined heart diseases: Secondary | ICD-10-CM

## 2022-02-07 DIAGNOSIS — F01518 Vascular dementia, unspecified severity, with other behavioral disturbance: Secondary | ICD-10-CM

## 2022-02-07 DIAGNOSIS — D509 Iron deficiency anemia, unspecified: Secondary | ICD-10-CM

## 2022-02-07 DIAGNOSIS — E7800 Pure hypercholesterolemia, unspecified: Secondary | ICD-10-CM

## 2022-02-07 DIAGNOSIS — I350 Nonrheumatic aortic (valve) stenosis: Secondary | ICD-10-CM

## 2022-02-07 DIAGNOSIS — I480 Paroxysmal atrial fibrillation: Secondary | ICD-10-CM

## 2022-02-07 DIAGNOSIS — R2681 Unsteadiness on feet: Secondary | ICD-10-CM | POA: Diagnosis not present

## 2022-02-07 DIAGNOSIS — M6389 Disorders of muscle in diseases classified elsewhere, multiple sites: Secondary | ICD-10-CM | POA: Diagnosis not present

## 2022-02-07 DIAGNOSIS — M810 Age-related osteoporosis without current pathological fracture: Secondary | ICD-10-CM | POA: Diagnosis not present

## 2022-02-07 DIAGNOSIS — I2583 Coronary atherosclerosis due to lipid rich plaque: Secondary | ICD-10-CM

## 2022-02-07 DIAGNOSIS — I2510 Atherosclerotic heart disease of native coronary artery without angina pectoris: Secondary | ICD-10-CM | POA: Diagnosis not present

## 2022-02-07 DIAGNOSIS — R296 Repeated falls: Secondary | ICD-10-CM | POA: Diagnosis not present

## 2022-02-07 DIAGNOSIS — Z9181 History of falling: Secondary | ICD-10-CM | POA: Diagnosis not present

## 2022-02-07 NOTE — Assessment & Plan Note (Signed)
Blood pressure is controlled, on Amlodipine, HCTZ, Losartan, Metoprolol. Bun/creat 12/0.4 11/24/21

## 2022-02-07 NOTE — Assessment & Plan Note (Signed)
demand ischemia, stent 1992 and 2005,  followed by Cardiology, Dr. Martinique. On ASA, statin, beta blocker.

## 2022-02-07 NOTE — Progress Notes (Signed)
Location:   [facility] Room Number: 18 Place of Service:  ALF (13) Provider: Lennie Odor Ihan Pat NP  [name], MD  Patient Care Team: [name], MD as PCP - General (Internal Medicine) Martinique, Peter M, MD as PCP - Cardiology (Cardiology)  Extended Emergency Contact Information Primary Emergency Contact: [name],Patricia Address: [phone]          [place] [address] [name] of [place] Phone: [phone] Mobile Phone: [phone] Relation: Sister Secondary Emergency Contact: [name],[name]  [name] of [place] Phone: [phone] Mobile Phone: [phone] Relation: Niece  Code Status:  DNR Goals of care: Advanced Directive information Advanced Directives 02/07/2022  Does Patient Have a Medical Advance Directive? Yes  Type of Paramedic of [place];Living will  Does patient want to make changes to medical advance directive? No - Patient declined  Copy of [place] in Chart? Yes - validated most recent copy scanned in chart (See row information)  Would patient like information on creating a medical advance directive? -     Chief Complaint  Patient presents with   Acute Visit    DOE    HPI:  Pt is a 88 y.o. female seen today for an acute visit for reported the patient exhibit increased SOB on exertion. The patient denied cough, chest pain/pressure, palpitation, PND, weight has been stable.    CAD, demand ischemia, stent 1992 and 2005,  followed by Cardiology, Dr. Martinique. On ASA, statin, beta blocker.              Grade I Diastolic dysfunction, echo 09/21/20 LVEF 65-70%             PAF, converted to SR 08/2020, on Metoprolol, not on anticoagulation due to falls             HTN, on Amlodipine, HCTZ, Losartan, Metoprolol. Bun/creat 12/0.4 11/24/21             Hyperlipidemia, takes Atorvastatin. LDL 27 01/23/22             AS monitor, DOE             IDA Hx of PRBC transfusion, IV iron. Hgb  12.3 11/24/21, off oral Fe  Dementia, CT 5/22 chronic vascular changes, MMSE 16/30, TSH 2.9 11/24/21, on Zyprexa, Sertraline  Depression, Sertraline Past Medical History:  Diagnosis Date   Anxiety and depression    Aortic stenosis    a. 03/2014: Mild aortic stenosis by valve area and mean gradient though appeared more visually consistent with moderate AS.    CAD (coronary artery disease)    a. s/p prior PCI of D2 in 1992 b. stent to RCA in 2005   Diverticula of colon    GERD (gastroesophageal reflux disease)    HTN (hypertension)    Hypercholesteremia    Lateral myocardial infarction ([place]) 1992   Osteoarthritis    Osteopenia    Scoliosis    Past Surgical History:  Procedure Laterality Date   ABDOMINAL HYSTERECTOMY     APPENDECTOMY     CORONARY ANGIOPLASTY     TONSILLECTOMY      Allergies  Allergen Reactions   Nifedipine Other (See Comments)    Dark Urine; can only take orange tablet, allergic to yellow-brown tablet   Tetanus Toxoids    Tetanus-Diphtheria Toxoids Td     Other reaction(s): Unknown   Adhesive [Tape] Rash    Allergies as of 02/07/2022       Reactions  Nifedipine Other (See Comments)   Dark Urine; can only take orange tablet, allergic to yellow-brown tablet   Tetanus Toxoids    Tetanus-diphtheria Toxoids Td    Other reaction(s): Unknown   Adhesive [tape] Rash        Medication List        Accurate as of February 07, 2022 11:59 PM. If you have any questions, ask your nurse or doctor.          acetaminophen 500 MG tablet Commonly known as: TYLENOL Take 1,000 mg by mouth 2 (two) times daily.   amLODipine 5 MG tablet Commonly known as: NORVASC Take 5 mg by mouth daily.   aspirin EC 81 MG tablet Take 1 tablet (81 mg total) by mouth daily. Swallow whole.   atorvastatin 40 MG tablet Commonly known as: LIPITOR TAKE 1 TABLET DAILY   cetirizine 10 MG tablet Commonly known as: ZYRTEC Take 10 mg by mouth daily.   cholecalciferol 1000 units  tablet Commonly known as: VITAMIN D Take 1,000 Units by mouth 2 (two) times daily.   hydrochlorothiazide 25 MG tablet Commonly known as: HYDRODIURIL TAKE 1 TABLET DAILY   losartan 100 MG tablet Commonly known as: COZAAR Take 1 tablet (100 mg total) by mouth daily.   metoprolol succinate 50 MG 24 hr tablet Commonly known as: TOPROL-XL TAKE 1 TABLET EVERY DAY    WITH OR RIGHT AFTER A MEAL   nitroGLYCERIN 0.4 MG SL tablet Commonly known as: Nitrostat Place 1 tablet (0.4 mg total) under the tongue every 5 (five) minutes as needed for chest pain.   OLANZapine 2.5 MG tablet Commonly known as: ZYPREXA Take 2.5 mg by mouth at bedtime.   sertraline 25 MG tablet Commonly known as: ZOLOFT Take 25 mg by mouth at bedtime. What changed: Another medication with the same name was removed. Continue taking this medication, and follow the directions you see here. Changed by: Ivanna Kocak X Prisma Decarlo, NP        Review of Systems  Constitutional:  Positive for fatigue. Negative for appetite change and fever.  HENT:  Positive for hearing loss. Negative for congestion and trouble swallowing.   Eyes:  Negative for visual disturbance.  Respiratory:  Positive for shortness of breath. Negative for cough, chest tightness and wheezing.        DOE  Gastrointestinal:  Negative for abdominal pain, constipation, nausea and vomiting.  Genitourinary:  Negative for dysuria, frequency and urgency.  Musculoskeletal:  Positive for gait problem.  Skin:  Positive for pallor.  Neurological:  Negative for speech difficulty, weakness and light-headedness.       Memory lapses.   Psychiatric/Behavioral:  Positive for confusion. Negative for sleep disturbance. The patient is not nervous/anxious.    Immunization History  Administered Date(s) Administered   Influenza Split 05/13/2009, 05/18/2010, 09/29/2011, 09/05/2012, 09/03/2013   Influenza, High Dose Seasonal PF 09/09/2015, 08/30/2017   Influenza, Quadrivalent, Recombinant,  Inj, Pf 08/19/2018, 09/29/2019   Influenza-Unspecified 10/18/2021   Pneumococcal Conjugate-13 07/08/2013   Pneumococcal Polysaccharide-23 06/29/2006, 05/13/2009, 05/18/2010, 05/25/2011   Td 05/13/2009, 05/18/2010, 05/25/2011   Zoster Recombinat (Shingrix) 08/24/2017, 10/31/2017   Zoster, Live 08/22/2008, 08/24/2008, 05/13/2009, 05/18/2010, 05/25/2011   Pertinent  Health Maintenance Due  Topic Date Due   INFLUENZA VACCINE  Completed   DEXA SCAN  Completed   Fall Risk 09/22/2020 12/15/2020 04/23/2021 05/02/2021 12/14/2021  Patient Fall Risk Level High fall risk Low fall risk Low fall risk High fall risk Low fall risk   Functional Status Survey:  Vitals:   02/07/22 1124  BP: 122/60  Pulse: 60  Resp: 20  Temp: (!) 97 F (36.1 C)  SpO2: 90%  Weight: 123 lb (55.8 kg)  Height: $Remove'5\' 4"'[id number]$  (1.626 m)   Body mass index is 21.11 kg/m. Physical Exam Vitals and nursing note reviewed.  Constitutional:      Comments: Appears tired.   HENT:     Head: Normocephalic and atraumatic.     Nose: Nose normal.     Mouth/Throat:     Mouth: Mucous membranes are moist.  Eyes:     Extraocular Movements: Extraocular movements intact.     Conjunctiva/sclera: Conjunctivae normal.     Pupils: Pupils are equal, round, and reactive to light.  Cardiovascular:     Rate and Rhythm: Normal rate and regular rhythm.     Heart sounds: Murmur heard.  Pulmonary:     Effort: Pulmonary effort is normal.  Abdominal:     General: Bowel sounds are normal.     Palpations: Abdomen is soft.     Tenderness: There is no abdominal tenderness.  Musculoskeletal:     Cervical back: Normal range of motion and neck supple.     Right lower leg: No edema.     Left lower leg: No edema.  Skin:    General: Skin is warm and dry.     Coloration: Skin is pale.  Neurological:     General: No focal deficit present.     Mental Status: She is alert. Mental status is at baseline.     Motor: No weakness.     Gait: Gait abnormal.      Deep Tendon Reflexes: Reflexes normal.  Psychiatric:        Mood and Affect: Mood normal.    Labs reviewed: Recent Labs    05/02/21 1032 11/24/21 0000  NA 140 141  K 3.2* 4.4  CL 104 106  CO2 27 27*  GLUCOSE 93  --   BUN 15 12  CREATININE 0.59 0.4*  CALCIUM 8.9 8.3*   Recent Labs    05/02/21 1032 11/24/21 0000  AST 30 16  ALT 18 12  ALKPHOS 101 106  BILITOT 0.7  --   PROT 7.6  --   ALBUMIN 4.0 3.2*   Recent Labs    05/02/21 1032 11/24/21 0000  WBC 7.2 4.9  NEUTROABS 5.6  --   HGB 13.9 12.3  HCT 44.9 38  MCV 92.2  --   PLT 235 366   Lab Results  Component Value Date   TSH 2.90 11/24/2021   No results found for: HGBA1C Lab Results  Component Value Date   CHOL 86 01/23/2022   HDL 43 01/23/2022   LDLCALC 27 01/23/2022   TRIG 77 01/23/2022   CHOLHDL 1.9 09/17/2020    Significant Diagnostic Results in last 30 days:  No results found.  Assessment/Plan Grade I diastolic dysfunction reported the patient exhibit increased SOB on exertion. The patient denied cough, chest pain/pressure, palpitation, PND, weight has been stable. echo 09/21/20 LVEF 65-70%. Will obtain CXR ap/lateral, echocardiogram, CBC/diff, CMP/eGFR, will get consent from her HPOA since the patient declined work up.   CAD (coronary artery disease) demand ischemia, stent 1992 and 2005,  followed by Cardiology, Dr. Martinique. On ASA, statin, beta blocker.  PAF (paroxysmal atrial fibrillation) ([place]) converted to SR 08/2020, on Metoprolol, not on anticoagulation due to falls  HTN (hypertension) Blood pressure is controlled, on Amlodipine, HCTZ, Losartan, Metoprolol. Bun/creat 12/0.4 11/24/21  Hypercholesteremia takes  Atorvastatin. LDL 27 01/23/22  Aortic stenosis, mild DOE, under cardiology care  IDA (iron deficiency anemia) Hx of PRBC transfusion, IV iron. Hgb 12.3 11/24/21,  Off  oral Fe. Needs update CBC/diff.   Vascular dementia with behavior disturbance CT 5/22 chronic vascular changes,  MMSE 16/30, TSH 2.9 11/24/21, on Zyprexa    Recurrent depression ([place]) Her mood is stable, on Sertraline, Zyprexa.     Family/ staff Communication: plan of care reviewed with the patient and charge nurse.   Labs/tests ordered:  CBC/diff, CMP/eGFR, CXR ap/lateral, Echocardiogram  Time spend 40 minutes.

## 2022-02-07 NOTE — Assessment & Plan Note (Signed)
Hx of PRBC transfusion, IV iron. Hgb 12.3 11/24/21,  Off  oral Fe. Needs update CBC/diff.

## 2022-02-07 NOTE — Progress Notes (Signed)
Location:   [facility] Room Number: 18 Place of Service:  ALF [phone]) Provider:  Marlana Latus NP  [name], MD  Patient Care Team: [name], MD as PCP - General (Internal Medicine) Martinique, Peter M, MD as PCP - Cardiology (Cardiology)  Extended Emergency Contact Information Primary Emergency Contact: [name],Patricia Address: [address]          [place] [address] [name] of [place] Phone: [phone] Mobile Phone: [phone] Relation: Sister Secondary Emergency Contact: [name],[name]  [name] of [place] Phone: [phone] Mobile Phone: [phone] Relation: Niece  Code Status:  Full Code Goals of care: Advanced Directive information Advanced Directives 02/07/2022  Does Patient Have a Medical Advance Directive? Yes  Type of Paramedic of [place];Living will  Does patient want to make changes to medical advance directive? No - Patient declined  Copy of [place] in Chart? Yes - validated most recent copy scanned in chart (See row information)  Would patient like information on creating a medical advance directive? -     Chief Complaint  Patient presents with   Acute Visit    DOE    HPI:  Pt is a 88 y.o. female seen today for an acute visit for    Past Medical History:  Diagnosis Date   Anxiety and depression    Aortic stenosis    a. 03/2014: Mild aortic stenosis by valve area and mean gradient though appeared more visually consistent with moderate AS.    CAD (coronary artery disease)    a. s/p prior PCI of D2 in 1992 b. stent to RCA in 2005   Diverticula of colon    GERD (gastroesophageal reflux disease)    HTN (hypertension)    Hypercholesteremia    Lateral myocardial infarction ([place]) 1992   Osteoarthritis    Osteopenia    Scoliosis    Past Surgical History:  Procedure Laterality Date   ABDOMINAL HYSTERECTOMY     APPENDECTOMY     CORONARY  ANGIOPLASTY     TONSILLECTOMY      Allergies  Allergen Reactions   Nifedipine Other (See Comments)    Dark Urine; can only take orange tablet, allergic to yellow-brown tablet   Tetanus Toxoids    Tetanus-Diphtheria Toxoids Td     Other reaction(s): Unknown   Adhesive [Tape] Rash    Allergies as of 02/07/2022       Reactions   Nifedipine Other (See Comments)   Dark Urine; can only take orange tablet, allergic to yellow-brown tablet   Tetanus Toxoids    Tetanus-diphtheria Toxoids Td    Other reaction(s): Unknown   Adhesive [tape] Rash        Medication List        Accurate as of February 07, 2022 11:41 AM. If you have any questions, ask your nurse or doctor.          acetaminophen 500 MG tablet Commonly known as: TYLENOL Take 1,000 mg by mouth 2 (two) times daily.   amLODipine 5 MG tablet Commonly known as: NORVASC Take 5 mg by mouth daily.   aspirin EC 81 MG tablet Take 1 tablet (81 mg total) by mouth daily. Swallow whole.   atorvastatin 40 MG tablet Commonly known as: LIPITOR TAKE 1 TABLET DAILY   cetirizine 10 MG tablet Commonly known as: ZYRTEC Take 10 mg by mouth daily.   cholecalciferol 1000 units tablet Commonly known as: VITAMIN D Take 1,000 Units  by mouth 2 (two) times daily.   hydrochlorothiazide 25 MG tablet Commonly known as: HYDRODIURIL TAKE 1 TABLET DAILY   losartan 100 MG tablet Commonly known as: COZAAR Take 1 tablet (100 mg total) by mouth daily.   metoprolol succinate 50 MG 24 hr tablet Commonly known as: TOPROL-XL TAKE 1 TABLET EVERY DAY    WITH OR RIGHT AFTER A MEAL   nitroGLYCERIN 0.4 MG SL tablet Commonly known as: Nitrostat Place 1 tablet (0.4 mg total) under the tongue every 5 (five) minutes as needed for chest pain.   OLANZapine 2.5 MG tablet Commonly known as: ZYPREXA Take 2.5 mg by mouth at bedtime.   sertraline 25 MG tablet Commonly known as: ZOLOFT Take 25 mg by mouth at bedtime. What changed: Another  medication with the same name was removed. Continue taking this medication, and follow the directions you see here. Changed by: Man X Mast, NP        Review of Systems  Immunization History  Administered Date(s) Administered   Influenza Split 05/13/2009, 05/18/2010, 09/29/2011, 09/05/2012, 09/03/2013   Influenza, High Dose Seasonal PF 09/09/2015, 08/30/2017   Influenza, Quadrivalent, Recombinant, Inj, Pf 08/19/2018, 09/29/2019   Influenza-Unspecified 10/18/2021   Pneumococcal Conjugate-13 07/08/2013   Pneumococcal Polysaccharide-23 06/29/2006, 05/13/2009, 05/18/2010, 05/25/2011   Td 05/13/2009, 05/18/2010, 05/25/2011   Zoster Recombinat (Shingrix) 08/24/2017, 10/31/2017   Zoster, Live 08/22/2008, 08/24/2008, 05/13/2009, 05/18/2010, 05/25/2011   Pertinent  Health Maintenance Due  Topic Date Due   INFLUENZA VACCINE  Completed   DEXA SCAN  Completed   Fall Risk 09/22/2020 12/15/2020 04/23/2021 05/02/2021 12/14/2021  Patient Fall Risk Level High fall risk Low fall risk Low fall risk High fall risk Low fall risk   Functional Status Survey:    Vitals:   02/07/22 1124  BP: 122/60  Pulse: 60  Resp: 20  Temp: (!) 97 F (36.1 C)  SpO2: 90%  Weight: 123 lb (55.8 kg)  Height: 5\' 4"  (1.626 m)   Body mass index is 21.11 kg/m. Physical Exam  Labs reviewed: Recent Labs    05/02/21 1032 11/24/21 0000  NA 140 141  K 3.2* 4.4  CL 104 106  CO2 27 27*  GLUCOSE 93  --   BUN 15 12  CREATININE 0.59 0.4*  CALCIUM 8.9 8.3*   Recent Labs    05/02/21 1032 11/24/21 0000  AST 30 16  ALT 18 12  ALKPHOS 101 106  BILITOT 0.7  --   PROT 7.6  --   ALBUMIN 4.0 3.2*   Recent Labs    05/02/21 1032 11/24/21 0000  WBC 7.2 4.9  NEUTROABS 5.6  --   HGB 13.9 12.3  HCT 44.9 38  MCV 92.2  --   PLT 235 366   Lab Results  Component Value Date   TSH 2.90 11/24/2021   No results found for: HGBA1C Lab Results  Component Value Date   CHOL 86 01/23/2022   HDL 43 01/23/2022    LDLCALC 27 01/23/2022   TRIG 77 01/23/2022   CHOLHDL 1.9 09/17/2020    Significant Diagnostic Results in last 30 days:  No results found.  Assessment/Plan There are no diagnoses linked to this encounter.   Family/ staff Communication:   Labs/tests ordered:

## 2022-02-07 NOTE — Assessment & Plan Note (Signed)
Her mood is stable, on Sertraline, Zyprexa.

## 2022-02-07 NOTE — Assessment & Plan Note (Signed)
takes Atorvastatin. LDL 27 01/23/22

## 2022-02-07 NOTE — Assessment & Plan Note (Signed)
CT 5/22 chronic vascular changes, MMSE 16/30, TSH 2.9 11/24/21, on Zyprexa

## 2022-02-07 NOTE — Assessment & Plan Note (Signed)
DOE, under cardiology care

## 2022-02-07 NOTE — Assessment & Plan Note (Signed)
converted to [place] 08/2020, on Metoprolol, not on anticoagulation due to falls

## 2022-02-07 NOTE — Assessment & Plan Note (Signed)
reported the patient exhibit increased SOB on exertion. The patient denied cough, chest pain/pressure, palpitation, PND, weight has been stable. echo 09/21/20 LVEF 65-70%. Will obtain CXR ap/lateral, echocardiogram, CBC/diff, CMP/eGFR, will get consent from her HPOA since the patient declined work up.

## 2022-02-08 DIAGNOSIS — R296 Repeated falls: Secondary | ICD-10-CM | POA: Diagnosis not present

## 2022-02-08 DIAGNOSIS — R2681 Unsteadiness on feet: Secondary | ICD-10-CM | POA: Diagnosis not present

## 2022-02-08 DIAGNOSIS — Z9181 History of falling: Secondary | ICD-10-CM | POA: Diagnosis not present

## 2022-02-08 DIAGNOSIS — M6281 Muscle weakness (generalized): Secondary | ICD-10-CM | POA: Diagnosis not present

## 2022-02-08 DIAGNOSIS — M810 Age-related osteoporosis without current pathological fracture: Secondary | ICD-10-CM | POA: Diagnosis not present

## 2022-02-08 DIAGNOSIS — M6389 Disorders of muscle in diseases classified elsewhere, multiple sites: Secondary | ICD-10-CM | POA: Diagnosis not present

## 2022-02-09 ENCOUNTER — Encounter: Payer: Self-pay | Admitting: Nurse Practitioner

## 2022-02-09 ENCOUNTER — Telehealth: Payer: Self-pay | Admitting: Internal Medicine

## 2022-02-09 DIAGNOSIS — I10 Essential (primary) hypertension: Secondary | ICD-10-CM | POA: Diagnosis not present

## 2022-02-09 DIAGNOSIS — D649 Anemia, unspecified: Secondary | ICD-10-CM | POA: Diagnosis not present

## 2022-02-09 LAB — CBC AND DIFFERENTIAL
HCT: 22 — AB (ref 36–46)
Hemoglobin: 6.3 — AB (ref 12.0–16.0)
WBC: 3.5

## 2022-02-09 LAB — HEPATIC FUNCTION PANEL
ALT: 22 U/L (ref 7–35)
AST: 23 (ref 13–35)
Alkaline Phosphatase: 90 (ref 25–125)
Bilirubin, Total: 0.5

## 2022-02-09 LAB — BASIC METABOLIC PANEL
BUN: 15 (ref 4–21)
CO2: 26 — AB (ref 13–22)
Chloride: 104 (ref 99–108)
Creatinine: 0.6 (ref 0.5–1.1)
Glucose: 117
Potassium: 3.7 mEq/L (ref 3.5–5.1)
Sodium: 137 (ref 137–147)

## 2022-02-09 LAB — COMPREHENSIVE METABOLIC PANEL
Albumin: 3.7 (ref 3.5–5.0)
Calcium: 8.6 — AB (ref 8.7–10.7)
Globulin: 2.7

## 2022-02-09 LAB — CBC: RBC: 2.73 — AB (ref 3.87–5.11)

## 2022-02-09 NOTE — Telephone Encounter (Signed)
Got call from Facility Patient HGB came back as 6.3 Repeated again and it was 6.3 She denies any Bleeding She has been getting SOB recently and Unable to walk due to this. Gave order to send her to ED Patient has refused to go .She does have h/o Dementia  Her POA her sister is coming to convince her.  She has refused GI work up before

## 2022-02-10 ENCOUNTER — Non-Acute Institutional Stay: Payer: Medicare Other | Admitting: Nurse Practitioner

## 2022-02-10 ENCOUNTER — Other Ambulatory Visit: Payer: Self-pay

## 2022-02-10 ENCOUNTER — Encounter: Payer: Self-pay | Admitting: Nurse Practitioner

## 2022-02-10 ENCOUNTER — Encounter (HOSPITAL_COMMUNITY): Payer: Self-pay

## 2022-02-10 ENCOUNTER — Observation Stay (HOSPITAL_COMMUNITY)
Admission: EM | Admit: 2022-02-10 | Discharge: 2022-02-11 | Disposition: A | Discharge status: Skilled Nursing Facility | Payer: Medicare Other | Attending: Internal Medicine | Admitting: Internal Medicine

## 2022-02-10 DIAGNOSIS — F32A Depression, unspecified: Secondary | ICD-10-CM

## 2022-02-10 DIAGNOSIS — Z91040 Latex allergy status: Secondary | ICD-10-CM | POA: Diagnosis not present

## 2022-02-10 DIAGNOSIS — I480 Paroxysmal atrial fibrillation: Secondary | ICD-10-CM

## 2022-02-10 DIAGNOSIS — Z955 Presence of coronary angioplasty implant and graft: Secondary | ICD-10-CM | POA: Diagnosis not present

## 2022-02-10 DIAGNOSIS — I2510 Atherosclerotic heart disease of native coronary artery without angina pectoris: Secondary | ICD-10-CM | POA: Diagnosis not present

## 2022-02-10 DIAGNOSIS — I5189 Other ill-defined heart diseases: Secondary | ICD-10-CM | POA: Diagnosis not present

## 2022-02-10 DIAGNOSIS — Z66 Do not resuscitate: Secondary | ICD-10-CM | POA: Diagnosis present

## 2022-02-10 DIAGNOSIS — F01518 Vascular dementia, unspecified severity, with other behavioral disturbance: Secondary | ICD-10-CM | POA: Diagnosis not present

## 2022-02-10 DIAGNOSIS — E876 Hypokalemia: Secondary | ICD-10-CM | POA: Diagnosis not present

## 2022-02-10 DIAGNOSIS — Z87891 Personal history of nicotine dependence: Secondary | ICD-10-CM | POA: Insufficient documentation

## 2022-02-10 DIAGNOSIS — Z79899 Other long term (current) drug therapy: Secondary | ICD-10-CM | POA: Insufficient documentation

## 2022-02-10 DIAGNOSIS — I350 Nonrheumatic aortic (valve) stenosis: Secondary | ICD-10-CM

## 2022-02-10 DIAGNOSIS — E7800 Pure hypercholesterolemia, unspecified: Secondary | ICD-10-CM | POA: Diagnosis not present

## 2022-02-10 DIAGNOSIS — I10 Essential (primary) hypertension: Secondary | ICD-10-CM | POA: Diagnosis not present

## 2022-02-10 DIAGNOSIS — I2583 Coronary atherosclerosis due to lipid rich plaque: Secondary | ICD-10-CM

## 2022-02-10 DIAGNOSIS — Z20822 Contact with and (suspected) exposure to covid-19: Secondary | ICD-10-CM | POA: Diagnosis not present

## 2022-02-10 DIAGNOSIS — F419 Anxiety disorder, unspecified: Secondary | ICD-10-CM | POA: Diagnosis not present

## 2022-02-10 DIAGNOSIS — D649 Anemia, unspecified: Secondary | ICD-10-CM

## 2022-02-10 DIAGNOSIS — R7889 Finding of other specified substances, not normally found in blood: Secondary | ICD-10-CM | POA: Diagnosis not present

## 2022-02-10 DIAGNOSIS — R0602 Shortness of breath: Secondary | ICD-10-CM | POA: Diagnosis not present

## 2022-02-10 DIAGNOSIS — Z7982 Long term (current) use of aspirin: Secondary | ICD-10-CM | POA: Diagnosis not present

## 2022-02-10 LAB — PREPARE RBC (CROSSMATCH)

## 2022-02-10 LAB — COMPREHENSIVE METABOLIC PANEL
ALT: 25 U/L (ref 0–44)
AST: 28 U/L (ref 15–41)
Albumin: 3.2 g/dL — ABNORMAL LOW (ref 3.5–5.0)
Alkaline Phosphatase: 97 U/L (ref 38–126)
Anion gap: 9 (ref 5–15)
BUN: 16 mg/dL (ref 8–23)
CO2: 24 mmol/L (ref 22–32)
Calcium: 8.5 mg/dL — ABNORMAL LOW (ref 8.9–10.3)
Chloride: 104 mmol/L (ref 98–111)
Creatinine, Ser: 0.74 mg/dL (ref 0.44–1.00)
GFR, Estimated: >60 mL/min (ref >60)
Glucose, Bld: 98 mg/dL (ref 70–99)
Potassium: 3.4 mmol/L — ABNORMAL LOW (ref 3.5–5.1)
Sodium: 137 mmol/L (ref 135–145)
Total Bilirubin: 0.5 mg/dL (ref 0.3–1.2)
Total Protein: 6.3 g/dL — ABNORMAL LOW (ref 6.5–8.1)

## 2022-02-10 LAB — POC OCCULT BLOOD, ED: Fecal Occult Bld: POSITIVE — AB

## 2022-02-10 MED ORDER — MORPHINE SULFATE (PF) 2 MG/ML IV SOLN
2.0000 mg | INTRAVENOUS | Status: DC | PRN
  Administered 2022-02-11: 2 mg via INTRAVENOUS
  Filled 2022-02-10: qty 1

## 2022-02-10 MED ORDER — ONDANSETRON HCL 4 MG PO TABS: 4.0000 mg | ORAL_TABLET | Freq: Four times a day (QID) | ORAL | Status: DC | PRN

## 2022-02-10 MED ORDER — ATORVASTATIN CALCIUM 40 MG PO TABS
40.0000 mg | ORAL_TABLET | Freq: Every day | ORAL | Status: DC
  Administered 2022-02-10: 40 mg via ORAL
  Filled 2022-02-10: qty 1

## 2022-02-10 MED ORDER — SODIUM CHLORIDE 0.9 % IV SOLN
10.0000 mL/h | Freq: Once | INTRAVENOUS | Status: AC
Start: 2022-02-10 — End: 2022-02-10
  Administered 2022-02-10: 10 mL/h via INTRAVENOUS

## 2022-02-10 MED ORDER — LORATADINE 10 MG PO TABS
10.0000 mg | ORAL_TABLET | Freq: Every day | ORAL | Status: DC
  Administered 2022-02-11: 10 mg via ORAL
  Filled 2022-02-10: qty 1

## 2022-02-10 MED ORDER — ONDANSETRON HCL 4 MG/2ML IJ SOLN
4.0000 mg | Freq: Four times a day (QID) | INTRAMUSCULAR | Status: DC | PRN
Start: 2022-02-10 — End: 2022-02-11

## 2022-02-10 MED ORDER — SODIUM CHLORIDE 0.9% FLUSH
3.0000 mL | Freq: Two times a day (BID) | INTRAVENOUS | Status: DC
  Administered 2022-02-10 – 2022-02-11 (×2): 3 mL via INTRAVENOUS

## 2022-02-10 MED ORDER — SERTRALINE HCL 25 MG PO TABS
25.0000 mg | ORAL_TABLET | Freq: Every day | ORAL | Status: DC
  Administered 2022-02-10: 25 mg via ORAL
  Filled 2022-02-10 (×2): qty 1

## 2022-02-10 MED ORDER — OLANZAPINE 5 MG PO TABS
2.5000 mg | ORAL_TABLET | Freq: Every day | ORAL | Status: DC
  Administered 2022-02-10: 2.5 mg via ORAL
  Filled 2022-02-10 (×3): qty 1

## 2022-02-10 MED ORDER — ACETAMINOPHEN 650 MG RE SUPP: 650.0000 mg | Freq: Four times a day (QID) | RECTAL | Status: DC | PRN

## 2022-02-10 MED ORDER — HYDRALAZINE HCL 20 MG/ML IJ SOLN: 5.0000 mg | INTRAMUSCULAR | Status: DC | PRN

## 2022-02-10 MED ORDER — AMLODIPINE BESYLATE 5 MG PO TABS
5.0000 mg | ORAL_TABLET | Freq: Every day | ORAL | Status: DC
Start: 2022-02-10 — End: 2022-02-11
  Administered 2022-02-10: 5 mg via ORAL
  Filled 2022-02-10: qty 1

## 2022-02-10 MED ORDER — ACETAMINOPHEN 325 MG PO TABS
650.0000 mg | ORAL_TABLET | Freq: Four times a day (QID) | ORAL | Status: DC | PRN
Start: 2022-02-10 — End: 2022-02-11

## 2022-02-10 NOTE — Assessment & Plan Note (Signed)
-  Continue Lipitor for now, but the patient appears to be interested in eliminating medications that she doesn't need and this may be reasonable to consider stopping

## 2022-02-10 NOTE — H&P (Signed)
History and Physical    Patient: [name] JME:[id number] DOB: 1929/12/16 DOA: 02/10/2022 DOS: the patient was seen and examined on 02/10/2022 PCP: [name], MD  Patient coming from: ALF/ILF - [facility]; NOK: NieceSyliva [name], [phone]   Chief Complaint: Symptomatic anemia  HPI: [name] is a 88 y.o. female with medical history significant of CAD s/p stent; dementia; HTN; and HLD presenting with symptomatic anemia.  She was previously admitted with this issue in 08/2020 and declined GI work-up at that time; she received PRBCs and IV iron during that hospitalization. At this time, the patient denies any symptoms (has dementia).  She remains adamant about no work-up.  She wants to receive blood products and be discharged asap, willing to wait until tomorrow.   ER Course:  Anemia, likely from GI bleeding.  Brown stool, heme positive.  [facility] sent her for symptoms.  She refuses any GI work-up.       Review of Systems: As mentioned in the history of present illness. All other systems reviewed and are negative. Past Medical History:  Diagnosis Date   Anxiety and depression    Aortic stenosis    a. 03/2014: Mild aortic stenosis by valve area and mean gradient though appeared more visually consistent with moderate AS.    CAD (coronary artery disease)    a. s/p prior PCI of D2 in 1992 b. stent to RCA in 2005   Diverticula of colon    GERD (gastroesophageal reflux disease)    HTN (hypertension)    Hypercholesteremia    Lateral myocardial infarction [facility]) 1992   Osteoarthritis    Osteopenia    Scoliosis    Past Surgical History:  Procedure Laterality Date   ABDOMINAL HYSTERECTOMY     APPENDECTOMY     CORONARY ANGIOPLASTY     TONSILLECTOMY     Social History:  reports that she quit smoking about 30 years ago. Her smoking use included cigarettes. She has never used smokeless tobacco. She reports that she does not drink alcohol and does not use  drugs.  Allergies  Allergen Reactions   Latex Other (See Comments)    Unknown reaction - listed on [facility] 02/10/22   Nifedipine Other (See Comments)    Dark Urine; can only take orange tablet, allergic to yellow-brown tablet   Tetanus Toxoids Other (See Comments)    Unknown reaction   Tetanus-Diphtheria Toxoids Td Other (See Comments)    Unknown reaction   Adhesive [Tape] Rash    Family History  Problem Relation Age of Onset   Hypertension Father    Heart failure Mother    Hypertension Mother    Cancer Brother     Prior to Admission medications   Medication Sig Start Date End Date Taking? Authorizing Provider  acetaminophen (TYLENOL) 500 MG tablet Take 1,000 mg by mouth 2 (two) times daily.    [provider]  amLODipine (NORVASC) 5 MG tablet Take 5 mg by mouth daily.    [provider]  aspirin EC 81 MG tablet Take 1 tablet (81 mg total) by mouth daily. Swallow whole. 09/29/20   [name] E, PA-C  atorvastatin (LIPITOR) 40 MG tablet TAKE 1 TABLET DAILY 07/07/20   [place], Peter M, MD  cetirizine (ZYRTEC) 10 MG tablet Take 10 mg by mouth daily.    [provider]  cholecalciferol (VITAMIN D) 1000 UNITS tablet Take 1,000 Units by mouth 2 (two) times daily.    [provider]  hydrochlorothiazide (HYDRODIURIL) 25 MG tablet TAKE 1 TABLET DAILY 02/14/21   [place], Peter M, MD  losartan (COZAAR) 100 MG tablet Take 1 tablet (100 mg total) by mouth daily. 09/17/20 12/25/48  [place], Peter M, MD  metoprolol succinate (TOPROL-XL) 50 MG 24 hr tablet TAKE 1 TABLET EVERY DAY    WITH OR RIGHT AFTER A MEAL 03/02/21   [place], Peter M, MD  nitroGLYCERIN (NITROSTAT) 0.4 MG SL tablet Place 1 tablet (0.4 mg total) under the tongue every 5 (five) minutes as needed for chest pain. 09/23/20   [place], Peter M, MD  OLANZapine (ZYPREXA) 2.5 MG tablet Take 2.5 mg by mouth at bedtime.    [provider]  sertraline (ZOLOFT) 25 MG tablet Take 25 mg by mouth at bedtime.     [provider]    Physical Exam: Vitals:   02/10/22 1230 02/10/22 1453 02/10/22 1515 02/10/22 1715  BP: 129/60 (!) 112/57 (!) 156/63 (!) 145/91  Pulse: (!) 52 61 (!) 52 65  Resp: 13 15 15  (!) 25  Temp:  (!) 97.5 F (36.4 C) 97.8 F (36.6 C)   TempSrc:  Oral Oral   SpO2: 95% 97% 100% 96%  Weight:      Height:       General:  Appears calm and comfortable and is in NAD Eyes:  PERRL, EOMI, normal lids, iris ENT:   hard of hearing, grossly normal lips & tongue, mmm; appropriate dentition Neck:  no LAD, masses or thyromegaly Cardiovascular:  RRR, no r/g, 4/6 systolic murmur. No LE edema.  Respiratory:   CTA bilaterally with no wheezes/rales/rhonchi.  Normal respiratory effort. Abdomen:  soft, NT, ND Skin:  no rash or induration seen on limited exam Musculoskeletal:  grossly normal tone BUE/BLE, good ROM, no bony abnormality Psychiatric:  grossly normal mood and affect, speech fluent and appropriate Neurologic:  CN 2-12 grossly intact, moves all extremities in coordinated fashion   Radiological Exams on Admission: Independently reviewed - see discussion in A/P where applicable  No results found.  EKG: Independently reviewed.  NSR with rate 54; RBBB with NSCSLT   Labs on Admission: I have personally reviewed the available labs and imaging studies at the time of the admission.  Pertinent labs:    Unremarkable CMP WBC 4.6 Hgb 6.1; 12.3 on 12/1 MCV 82, RDW 17.3 Heme positive    Assessment and Plan: * Symptomatic anemia- (present on admission) -Patient with prior admission for presumed GI bleeding and refused work-up -Per facility, she has been having symptoms c/w anemia and so they did blood work and confirmed (patient denies any symptoms) -Hgb 6.1; 12.3 on 12/1 -MCV 82, RDW 17.3 -MCV is c/w normocytic anemia/chronic disease -Heme testing was positive but she clearly declines further GI evaluation -Will observe overnight on telemetry and plan to d/c tomorrow,  post-transfusion -Transfuse 2 units PRBC  DNR (do not resuscitate)- (present on admission) -I have discussed code status with the patient and her niece and  they are in agreement that the patient would not desire resuscitation and would prefer to die a natural death should that situation arise. -She will need a gold out of facility DNR form at the time of discharge -She was adamant that she does not want any kind of interventions (other than blood)  Grade I diastolic dysfunction -Will not give additional IVF -Appears to be compensated at this time  Vascular dementia with behavior disturbance- (present on admission) -Continue Zoloft and Zyprexa -Will order delirium precautions  Hypercholesteremia- (present on  admission) -Continue Lipitor for now, but the patient appears to be interested in eliminating medications that she doesn't need and this may be reasonable to consider stopping  HTN (hypertension)- (present on admission) -Slightly low BP in the ER and bradycardia -Hold Toprol as well as Cozaar and HCTZ -Will resume amlodopine at bedtime and add back sequentially as needed -Will cover with prn IV hydralazine  CAD (coronary artery disease)- (present on admission) -Hold ASA for now but will need to consider risk:benefit analysis to decide when/if to resume     Advance Care Planning:   Code Status: DNR   Consults: None  DVT Prophylaxis: SCDs  Family Communication: Niece was present throughout evaluation  Severity of Illness: The appropriate patient status for this patient is OBSERVATION. Observation status is judged to be reasonable and necessary in order to provide the required intensity of service to ensure the patient's safety. The patient's presenting symptoms, physical exam findings, and initial radiographic and laboratory data in the context of their medical condition is felt to place them at decreased risk for further clinical deterioration. Furthermore, it is  anticipated that the patient will be medically stable for discharge from the hospital within 2 midnights of admission.   Author: Karmen Bongo, MD 02/10/2022 6:59 PM  For on call review [email or web address].

## 2022-02-10 NOTE — Progress Notes (Signed)
This encounter was created in error - please disregard.

## 2022-02-10 NOTE — ED Provider Notes (Signed)
[place] EMERGENCY DEPARTMENT Provider Note   CSN: [id number] Arrival date & time:        History  Chief Complaint  Patient presents with   Abnormal Lab    [name] is a 88 y.o. female.  Pt is a 88y/o female with hx of CAD status post stent, moderate aortic stenosis, HTN, HLD, GERD and prior anemia approximately a year and a half ago of unknown etiology that required blood transfusion but she declined GI work-up at that time who is presenting today due to abnormal labs.  Patient was seen by her medical doctor due to worsening shortness of breath with exertion, appearing pale and had an outpatient hemoglobin that was 6 today.  Because of patient's symptoms and concern for anemia she was sent here for further care.  Patient currently reports that she feels fine.  She denies any chest pain or shortness of breath at rest.  She has been eating and drinking normally.  She does not take anticoagulation other than an aspirin.  The history is provided by the patient, medical records and a relative.  Abnormal Lab     Home Medications Prior to Admission medications   Medication Sig Start Date End Date Taking? Authorizing Provider  acetaminophen (TYLENOL) 500 MG tablet Take 1,000 mg by mouth 2 (two) times daily.    [provider]  amLODipine (NORVASC) 5 MG tablet Take 5 mg by mouth daily.    [provider]  aspirin EC 81 MG tablet Take 1 tablet (81 mg total) by mouth daily. Swallow whole. 09/29/20   [name] E, PA-C  atorvastatin (LIPITOR) 40 MG tablet TAKE 1 TABLET DAILY 07/07/20   [place], Peter M, MD  cetirizine (ZYRTEC) 10 MG tablet Take 10 mg by mouth daily.    [provider]  cholecalciferol (VITAMIN D) 1000 UNITS tablet Take 1,000 Units by mouth 2 (two) times daily.    [provider]  hydrochlorothiazide (HYDRODIURIL) 25 MG tablet TAKE 1 TABLET DAILY 02/14/21   [place], Peter M, MD  losartan (COZAAR) 100 MG tablet Take  1 tablet (100 mg total) by mouth daily. 09/17/20 12/25/48  [place], Peter M, MD  metoprolol succinate (TOPROL-XL) 50 MG 24 hr tablet TAKE 1 TABLET EVERY DAY    WITH OR RIGHT AFTER A MEAL 03/02/21   [place], Peter M, MD  nitroGLYCERIN (NITROSTAT) 0.4 MG SL tablet Place 1 tablet (0.4 mg total) under the tongue every 5 (five) minutes as needed for chest pain. 09/23/20   [place], Peter M, MD  OLANZapine (ZYPREXA) 2.5 MG tablet Take 2.5 mg by mouth at bedtime.    [provider]  sertraline (ZOLOFT) 25 MG tablet Take 25 mg by mouth at bedtime.    [provider]      Allergies    Nifedipine, Tetanus toxoids, Tetanus-diphtheria toxoids td, and Adhesive [tape]    Review of Systems   Review of Systems  Physical Exam Updated Vital Signs BP 129/60    Pulse (!) 52    Temp 97.8 F (36.6 C) (Oral)    Resp 13    Ht 5\' 4"  (1.626 m)    Wt 55.8 kg    SpO2 95%    BMI 21.11 kg/m  Physical Exam Vitals and nursing note reviewed.  Constitutional:      General: She is not in acute distress.    Appearance: She is well-developed.  HENT:     Head: Normocephalic and atraumatic.  Mouth/Throat:     Mouth: Mucous membranes are moist.  Eyes:     Pupils: Pupils are equal, round, and reactive to light.     Comments: Pale conjunctive a  Cardiovascular:     Rate and Rhythm: Normal rate and regular rhythm.     Heart sounds: Murmur heard.     Comments: 4 out of 6 holosystolic murmur Pulmonary:     Effort: Pulmonary effort is normal. No respiratory distress.     Breath sounds: Normal breath sounds. No wheezing or rales.  Abdominal:     General: There is no distension.     Palpations: Abdomen is soft.     Tenderness: There is no abdominal tenderness. There is no guarding or rebound.  Musculoskeletal:        General: No tenderness. Normal range of motion.     Cervical back: Normal range of motion and neck supple.  Skin:    General: Skin is warm and dry.     Coloration: Skin is pale.     Findings:  No erythema or rash.  Neurological:     Mental Status: She is alert. Mental status is at baseline.     Sensory: No sensory deficit.     Motor: No weakness.     Comments: Oriented to person and place  Psychiatric:        Mood and Affect: Mood normal.        Behavior: Behavior normal.    ED Results / Procedures / Treatments   Labs (all labs ordered are listed, but only abnormal results are displayed) Labs Reviewed  CBC WITH DIFFERENTIAL/PLATELET - Abnormal; Notable for the following components:      Result Value   RBC 2.66 (*)    Hemoglobin 6.1 (*)    HCT 21.8 (*)    MCH 22.9 (*)    MCHC 28.0 (*)    RDW 17.3 (*)    All other components within normal limits  COMPREHENSIVE METABOLIC PANEL - Abnormal; Notable for the following components:   Potassium 3.4 (*)    Calcium 8.5 (*)    Total Protein 6.3 (*)    Albumin 3.2 (*)    All other components within normal limits  POC OCCULT BLOOD, ED - Abnormal; Notable for the following components:   Fecal Occult Bld POSITIVE (*)    All other components within normal limits  TYPE AND SCREEN  PREPARE RBC (CROSSMATCH)    EKG EKG Interpretation  Date/Time:  Friday February 10 2022 12:35:43 EST Ventricular Rate:  54 PR Interval:  217 QRS Duration: 132 QT Interval:  493 QTC Calculation: 468 R Axis:   44 Text Interpretation: Sinus arrhythmia Borderline prolonged PR interval Right bundle branch block No significant change since last tracing Confirmed by Blanchie Dessert [phone]) on 02/10/2022 12:37:42 PM  Radiology No results found.  Procedures Procedures    Medications Ordered in ED Medications  0.9 %  sodium chloride infusion (has no administration in time range)    ED Course/ Medical Decision Making/ A&P                           Medical Decision Making Amount and/or Complexity of Data Reviewed Labs: ordered.  Risk Prescription drug management.   Elderly female.  Today presenting with exertional dyspnea and fatigue  with findings of recurrent anemia.  Patient had a hemoglobin of 6.3 when checked yesterday.  Patient's daughter is present with her and external medical  records from her last hospitalization for anemia a year and a half ago were reviewed.  Patient's last blood transfusion was at that time.  Unclear what was causing the anemia but patient at the time was not interested in GI work-up.  Speaking with the patient today she reports she is still not interested in a GI work-up.  But she is amenable to having her blood checked and receiving blood if that is necessary.  She is in no acute distress at this time.  Patient's daughter is present with her in the room and also part of the conversation.  1:59 PM I independently interpreted patient's EKG and labs today.  EKG without acute changes with chronic right bundle branch block.  CBC today with anemia with a hemoglobin of 6.1 which appears to be microcytic, normal white count and platelet count, Hemoccult positive today however patient reports she is not interested in GI seeing her.  CMP without acute findings.  Will transfuse 2 units and admit for further care.  In the past patient did receive an iron transfusion.  At this time patient does not meet criteria for admission.  Spoke with the hospitalist.  Patient and her daughter are in agreement with this plan.  CRITICAL CARE Performed by: Cortney Beissel Total critical care time: 30 minutes Critical care time was exclusive of separately billable procedures and treating other patients. Critical care was necessary to treat or prevent imminent or life-threatening deterioration. Critical care was time spent personally by me on the following activities: development of treatment plan with patient and/or surrogate as well as nursing, discussions with consultants, evaluation of patient's response to treatment, examination of patient, obtaining history from patient or surrogate, ordering and performing treatments and  interventions, ordering and review of laboratory studies, ordering and review of radiographic studies, pulse oximetry and re-evaluation of patient's condition.         Final Clinical Impression(s) / ED Diagnoses Final diagnoses:  Symptomatic anemia    Rx / DC Orders ED Discharge Orders     None         Blanchie Dessert, MD 02/10/22 1359

## 2022-02-10 NOTE — Assessment & Plan Note (Signed)
Her mood is stable, continue Sertraline.  

## 2022-02-10 NOTE — Assessment & Plan Note (Signed)
echo 09/21/20 LVEF 65-70%

## 2022-02-10 NOTE — Assessment & Plan Note (Signed)
Blood pressure is controlled, on Amlodipine, HCTZ, Losartan, Metoprolol. Bun/creat 15/0.59 02/09/22

## 2022-02-10 NOTE — Assessment & Plan Note (Signed)
DOE

## 2022-02-10 NOTE — Assessment & Plan Note (Signed)
-  Will not give additional IVF -Appears to be compensated at this time

## 2022-02-10 NOTE — Assessment & Plan Note (Signed)
-  Patient with prior admission for presumed GI bleeding and refused work-up -Per facility, she has been having symptoms c/w anemia and so they did blood work and confirmed (patient denies any symptoms) -Hgb 6.1; 12.3 on 12/1 -MCV 82, RDW 17.3 -MCV is c/w normocytic anemia/chronic disease -Heme testing was positive but she clearly declines further GI evaluation -Will observe overnight on telemetry and plan to d/c tomorrow, post-transfusion -Transfuse 2 units PRBC

## 2022-02-10 NOTE — Assessment & Plan Note (Addendum)
Hgb 6.3 02/09/22, confirmed with repeated H+H. No apparent bleeding. Hx of PRBC transfusion, IV iron. Hgb 12.3 11/24/21>>6.3 02/09/22, off oral Fe CBC/diff, Fe 325mg  qd, dc ASA, Tylenol, start Omeprazole 20mg  qd, FOBT x3 if the patient refuses ED Spoke with the patient: DNR, MOST limited intervention, ABT/IVF for defined trial period.  The patient agreed to ED, recommended Hematology consultation.

## 2022-02-10 NOTE — ED Triage Notes (Signed)
Pt presents via EMS for low hemoglobin. Had blood work done at facility yesterday and was 6.3. Pt has no other complaints. Denies pain. VSS en route.

## 2022-02-10 NOTE — Assessment & Plan Note (Signed)
-  Hold ASA for now but will need to consider risk:benefit analysis to decide when/if to resume

## 2022-02-10 NOTE — ED Notes (Signed)
Pt placed on 3L [place] due to decreased O2. Pt now 100% on 3L [place].

## 2022-02-10 NOTE — ED Notes (Signed)
Blood consent signed by patient.

## 2022-02-10 NOTE — ED Notes (Signed)
Hospitalist at bedside 

## 2022-02-10 NOTE — Assessment & Plan Note (Signed)
takes Atorvastatin. LDL 27 01/23/22

## 2022-02-10 NOTE — Assessment & Plan Note (Signed)
demand ischemia, stent 1992 and 2005,  followed by Cardiology, Dr. Martinique. On ASA, statin, beta blocker.

## 2022-02-10 NOTE — Assessment & Plan Note (Signed)
-  Slightly low BP in the ER and bradycardia -Hold Toprol as well as Cozaar and HCTZ -Will resume amlodopine at bedtime and add back sequentially as needed -Will cover with prn IV hydralazine

## 2022-02-10 NOTE — Assessment & Plan Note (Signed)
-  I have discussed code status with the patient and her niece and  they are in agreement that the patient would not desire resuscitation and would prefer to die a natural death should that situation arise. -She will need a gold out of facility DNR form at the time of discharge -She was adamant that she does not want any kind of interventions (other than blood)

## 2022-02-10 NOTE — Assessment & Plan Note (Signed)
CT 5/22 chronic vascular changes, MMSE 16/30, TSH 2.9 11/24/21, on Zyprexa, Sertraline

## 2022-02-10 NOTE — Assessment & Plan Note (Signed)
converted to [place] 08/2020, on Metoprolol, not on anticoagulation due to falls

## 2022-02-10 NOTE — Assessment & Plan Note (Signed)
-  Continue Zoloft and Zyprexa -Will order delirium precautions

## 2022-02-10 NOTE — Progress Notes (Signed)
Location:   [facility] Room Number: 18 Place of Service:  ALF (13) Provider: Lennie Odor Hollee Fate NP  [name], MD  Patient Care Team: [name], MD as PCP - General (Internal Medicine) Martinique, Peter M, MD as PCP - Cardiology (Cardiology)  Extended Emergency Contact Information Primary Emergency Contact: [name],Patricia Address: [phone]          [place] [address] [name] of [place] Phone: [phone] Mobile Phone: [phone] Relation: Sister Secondary Emergency Contact: [name],[name]  [name] of [place] Phone: [phone] Mobile Phone: [phone] Relation: Niece  Code Status:  DNR Goals of care: Advanced Directive information Advanced Directives 02/10/2022  Does Patient Have a Medical Advance Directive? Yes  Type of Advance Directive [place]  Does patient want to make changes to medical advance directive? No - Patient declined  Copy of [place] in Chart? Yes - validated most recent copy scanned in chart (See row information)  Would patient like information on creating a medical advance directive? -     Chief Complaint  Patient presents with   Acute Visit    anemia    HPI:  Pt is a 88 y.o. female seen today for an acute visit for anemia, Hgb 6.3 02/09/22, confirmed with repeated H+H. No apparent bleeding   CAD, demand ischemia, stent 1992 and 2005,  followed by Cardiology, Dr. Martinique. On ASA, statin, beta blocker.              Grade I Diastolic dysfunction, echo 09/21/20 LVEF 65-70%             PAF, converted to SR 08/2020, on Metoprolol, not on anticoagulation due to falls             HTN, on Amlodipine, HCTZ, Losartan, Metoprolol. Bun/creat 15/0.59 02/09/22             Hyperlipidemia, takes Atorvastatin. LDL 27 01/23/22             AS monitor, DOE             IDA Hx of PRBC transfusion, IV iron. Hgb 12.3 11/24/21>>6.3 02/09/22, off oral Fe             Dementia, CT 5/22 chronic  vascular changes, MMSE 16/30, TSH 2.9 11/24/21, on Zyprexa, Sertraline             Depression, Sertraline  Past Medical History:  Diagnosis Date   Anxiety and depression    Aortic stenosis    a. 03/2014: Mild aortic stenosis by valve area and mean gradient though appeared more visually consistent with moderate AS.    CAD (coronary artery disease)    a. s/p prior PCI of D2 in 1992 b. stent to RCA in 2005   Diverticula of colon    GERD (gastroesophageal reflux disease)    HTN (hypertension)    Hypercholesteremia    Lateral myocardial infarction ([place]) 1992   Osteoarthritis    Osteopenia    Scoliosis    Past Surgical History:  Procedure Laterality Date   ABDOMINAL HYSTERECTOMY     APPENDECTOMY     CORONARY ANGIOPLASTY     TONSILLECTOMY      Allergies  Allergen Reactions   Latex Other (See Comments)    Unknown reaction - listed on [facility] 02/10/22   Nifedipine Other (See Comments)    Dark Urine; can only take orange tablet, allergic to yellow-brown tablet   Tetanus Toxoids Other (See Comments)  Unknown reaction   Tetanus-Diphtheria Toxoids Td Other (See Comments)    Unknown reaction   Adhesive [Tape] Rash    Allergies as of 02/10/2022       Reactions   Latex Other (See Comments)   Unknown reaction - listed on [facility] 02/10/22   Nifedipine Other (See Comments)   Dark Urine; can only take orange tablet, allergic to yellow-brown tablet   Tetanus Toxoids Other (See Comments)   Unknown reaction   Tetanus-diphtheria Toxoids Td Other (See Comments)   Unknown reaction   Adhesive [tape] Rash        Medication List      Notice   This visit is on the same day as an admission, and a visit start time could not be determined. If the visit took place after discharge, manually review the med list with the patient.     Review of Systems  Constitutional:  Positive for fatigue. Negative for appetite change and fever.  HENT:  Positive for hearing loss. Negative for congestion and  trouble swallowing.   Eyes:  Negative for visual disturbance.  Respiratory:  Positive for shortness of breath. Negative for cough, chest tightness and wheezing.        DOE  Gastrointestinal:  Negative for abdominal pain, constipation, nausea and vomiting.  Genitourinary:  Negative for dysuria, frequency and urgency.  Musculoskeletal:  Positive for gait problem.  Skin:  Positive for pallor.  Neurological:  Negative for speech difficulty, weakness and light-headedness.       Memory lapses.   Psychiatric/Behavioral:  Positive for confusion. Negative for sleep disturbance. The patient is not nervous/anxious.    Immunization History  Administered Date(s) Administered   Influenza Split 05/13/2009, 05/18/2010, 09/29/2011, 09/05/2012, 09/03/2013   Influenza, High Dose Seasonal PF 09/09/2015, 08/30/2017   Influenza, Quadrivalent, Recombinant, Inj, Pf 08/19/2018, 09/29/2019   Influenza-Unspecified 10/18/2021   Pneumococcal Conjugate-13 07/08/2013   Pneumococcal Polysaccharide-23 06/29/2006, 05/13/2009, 05/18/2010, 05/25/2011   Td 05/13/2009, 05/18/2010, 05/25/2011   Zoster Recombinat (Shingrix) 08/24/2017, 10/31/2017   Zoster, Live 08/22/2008, 08/24/2008, 05/13/2009, 05/18/2010, 05/25/2011   Pertinent  Health Maintenance Due  Topic Date Due   INFLUENZA VACCINE  Completed   DEXA SCAN  Completed   Fall Risk 04/23/2021 05/02/2021 12/14/2021 02/10/2022 02/11/2022  Patient Fall Risk Level Low fall risk High fall risk Low fall risk Moderate fall risk Moderate fall risk   Functional Status Survey:    Vitals:   02/10/22 1219  BP: 128/89  Pulse: 69  Resp: 16  Temp: 97.9 F (36.6 C)  SpO2: 95%  Weight: 123 lb (55.8 kg)  Height: 5\' 4"  (1.626 m)   Body mass index is 21.11 kg/m. Physical Exam Vitals and nursing note reviewed.  Constitutional:      Comments: Appears tired.   HENT:     Head: Normocephalic and atraumatic.     Nose: Nose normal.     Mouth/Throat:     Mouth: Mucous membranes  are moist.  Eyes:     Extraocular Movements: Extraocular movements intact.     Conjunctiva/sclera: Conjunctivae normal.     Pupils: Pupils are equal, round, and reactive to light.  Cardiovascular:     Rate and Rhythm: Normal rate and regular rhythm.     Heart sounds: Murmur heard.  Pulmonary:     Effort: Pulmonary effort is normal.  Abdominal:     General: Bowel sounds are normal.     Palpations: Abdomen is soft.     Tenderness: There is no abdominal tenderness.  Musculoskeletal:     Cervical back: Normal range of motion and neck supple.     Right lower leg: No edema.     Left lower leg: No edema.  Skin:    General: Skin is warm and dry.     Coloration: Skin is pale.  Neurological:     General: No focal deficit present.     Mental Status: She is alert. Mental status is at baseline.     Motor: No weakness.     Gait: Gait abnormal.     Deep Tendon Reflexes: Reflexes normal.  Psychiatric:        Mood and Affect: Mood normal.    Labs reviewed: Recent Labs    05/02/21 1032 11/24/21 0000 02/10/22 1303 02/11/22 0108  NA 140 141 137 138  K 3.2* 4.4 3.4* 3.0*  CL 104 106 104 106  CO2 27 27* 24 24  GLUCOSE 93  --  98 110*  BUN 15 12 16 11   CREATININE 0.59 0.4* 0.74 0.64  CALCIUM 8.9 8.3* 8.5* 8.3*   Recent Labs    05/02/21 1032 11/24/21 0000 02/10/22 1303  AST 30 16 28   ALT 18 12 25   ALKPHOS 101 106 97  BILITOT 0.7  --  0.5  PROT 7.6  --  6.3*  ALBUMIN 4.0 3.2* 3.2*   Recent Labs    05/02/21 1032 11/24/21 0000 02/10/22 1303 02/11/22 0108  WBC 7.2 4.9 4.6 5.8  NEUTROABS 5.6  --  2.8 4.1  HGB 13.9 12.3 6.1* 8.0*  HCT 44.9 38 21.8* 26.6*  MCV 92.2  --  82.0 80.4  PLT 235 366 253 216   Lab Results  Component Value Date   TSH 2.90 11/24/2021   No results found for: HGBA1C Lab Results  Component Value Date   CHOL 86 01/23/2022   HDL 43 01/23/2022   LDLCALC 27 01/23/2022   TRIG 77 01/23/2022   CHOLHDL 1.9 09/17/2020    Significant Diagnostic  Results in last 30 days:  No results found.  Assessment/Plan Symptomatic anemia Hgb 6.3 02/09/22, confirmed with repeated H+H. No apparent bleeding. Hx of PRBC transfusion, IV iron. Hgb 12.3 11/24/21>>6.3 02/09/22, off oral Fe CBC/diff, Fe 325mg  qd, dc ASA, Tylenol, start Omeprazole 20mg  qd, FOBT x3 if the patient refuses ED Spoke with the patient: DNR, MOST limited intervention, ABT/IVF for defined trial period.  The patient agreed to ED, recommended Hematology consultation.   Vascular dementia with behavior disturbance CT 5/22 chronic vascular changes, MMSE 16/30, TSH 2.9 11/24/21, on Zyprexa, Sertraline  Anxiety and depression Her mood is stable, continue Sertraline.   Aortic stenosis, mild DOE  Hypercholesteremia  takes Atorvastatin. LDL 27 01/23/22  HTN (hypertension) Blood pressure is controlled, on Amlodipine, HCTZ, Losartan, Metoprolol. Bun/creat 15/0.59 02/09/22  PAF (paroxysmal atrial fibrillation) ([place]) converted to SR 08/2020, on Metoprolol, not on anticoagulation due to falls  Grade I diastolic dysfunction  echo 09/21/20 LVEF 65-70%  CAD (coronary artery disease) demand ischemia, stent 1992 and 2005,  followed by Cardiology, Dr. Martinique. On ASA, statin, beta blocker.     Family/ staff Communication: plan of care reviewed with the patient and charge nurse.   Labs/tests ordered:  none  Time spend 40 minutes.

## 2022-02-11 DIAGNOSIS — D649 Anemia, unspecified: Secondary | ICD-10-CM | POA: Diagnosis not present

## 2022-02-11 LAB — CBC WITH DIFFERENTIAL/PLATELET
Abs Immature Granulocytes: 0.02 10*3/uL (ref 0.00–0.07)
Abs Immature Granulocytes: 0.04 10*3/uL (ref 0.00–0.07)
Basophils Absolute: 0 10*3/uL (ref 0.0–0.1)
Basophils Absolute: 0 10*3/uL (ref 0.0–0.1)
Basophils Relative: 1 %
Basophils Relative: 0 %
Eosinophils Absolute: 0.1 10*3/uL (ref 0.0–0.5)
Eosinophils Absolute: 0.2 10*3/uL (ref 0.0–0.5)
Eosinophils Relative: 2 %
Eosinophils Relative: 3 %
HCT: 21.8 % — ABNORMAL LOW (ref 36.0–46.0)
HCT: 26.6 % — ABNORMAL LOW (ref 36.0–46.0)
Hemoglobin: 6.1 g/dL — CL (ref 12.0–15.0)
Hemoglobin: 8 g/dL — ABNORMAL LOW (ref 12.0–15.0)
Immature Granulocytes: 0 %
Immature Granulocytes: 1 %
Lymphocytes Relative: 19 %
Lymphocytes Relative: 12 %
Lymphs Abs: 0.9 10*3/uL (ref 0.7–4.0)
Lymphs Abs: 0.7 10*3/uL (ref 0.7–4.0)
MCH: 22.9 pg — ABNORMAL LOW (ref 26.0–34.0)
MCH: 24.2 pg — ABNORMAL LOW (ref 26.0–34.0)
MCHC: 28 g/dL — ABNORMAL LOW (ref 30.0–36.0)
MCHC: 30.1 g/dL (ref 30.0–36.0)
MCV: 82 fL (ref 80.0–100.0)
MCV: 80.4 fL (ref 80.0–100.0)
Monocytes Absolute: 0.8 10*3/uL (ref 0.1–1.0)
Monocytes Absolute: 0.8 10*3/uL (ref 0.1–1.0)
Monocytes Relative: 17 %
Monocytes Relative: 14 %
Neutro Abs: 2.8 10*3/uL (ref 1.7–7.7)
Neutro Abs: 4.1 10*3/uL (ref 1.7–7.7)
Neutrophils Relative %: 61 %
Neutrophils Relative %: 70 %
Platelets: 253 10*3/uL (ref 150–400)
Platelets: 216 10*3/uL (ref 150–400)
RBC: 2.66 MIL/uL — ABNORMAL LOW (ref 3.87–5.11)
RBC: 3.31 MIL/uL — ABNORMAL LOW (ref 3.87–5.11)
RDW: 17.3 % — ABNORMAL HIGH (ref 11.5–15.5)
RDW: 16.6 % — ABNORMAL HIGH (ref 11.5–15.5)
WBC: 4.6 10*3/uL (ref 4.0–10.5)
WBC: 5.8 10*3/uL (ref 4.0–10.5)
nRBC: 0 % (ref 0.0–0.2)
nRBC: 0.3 % — ABNORMAL HIGH (ref 0.0–0.2)

## 2022-02-11 LAB — BASIC METABOLIC PANEL
Anion gap: 8 (ref 5–15)
BUN: 11 mg/dL (ref 8–23)
CO2: 24 mmol/L (ref 22–32)
Calcium: 8.3 mg/dL — ABNORMAL LOW (ref 8.9–10.3)
Chloride: 106 mmol/L (ref 98–111)
Creatinine, Ser: 0.64 mg/dL (ref 0.44–1.00)
GFR, Estimated: >60 mL/min (ref >60)
Glucose, Bld: 110 mg/dL — ABNORMAL HIGH (ref 70–99)
Potassium: 3 mmol/L — ABNORMAL LOW (ref 3.5–5.1)
Sodium: 138 mmol/L (ref 135–145)

## 2022-02-11 LAB — BPAM RBC
Blood Product Expiration Date: 202302282359
Blood Product Expiration Date: 202302282359
ISSUE DATE / TIME: 202302171447
ISSUE DATE / TIME: 202302171849
Unit Type and Rh: 7300
Unit Type and Rh: 7300

## 2022-02-11 LAB — TYPE AND SCREEN
ABO/RH(D): B POS
Antibody Screen: NEGATIVE
Unit division: 0
Unit division: 0

## 2022-02-11 LAB — RESP PANEL BY RT-PCR (FLU A&B, COVID) ARPGX2
Influenza A by PCR: NEGATIVE
Influenza B by PCR: NEGATIVE
SARS Coronavirus 2 by RT PCR: NEGATIVE

## 2022-02-11 MED ORDER — FERROUS SULFATE 325 (65 FE) MG PO TABS
325.0000 mg | ORAL_TABLET | Freq: Two times a day (BID) | ORAL | Status: DC
  Administered 2022-02-11: 325 mg via ORAL
  Filled 2022-02-11 (×2): qty 1

## 2022-02-11 MED ORDER — FOLIC ACID 1 MG PO TABS
1.0000 mg | ORAL_TABLET | Freq: Every day | ORAL | Status: DC
  Administered 2022-02-11: 1 mg via ORAL
  Filled 2022-02-11: qty 1

## 2022-02-11 MED ORDER — POTASSIUM CHLORIDE CRYS ER 20 MEQ PO TBCR
40.0000 meq | EXTENDED_RELEASE_TABLET | ORAL | Status: AC
  Administered 2022-02-11 (×2): 40 meq via ORAL
  Filled 2022-02-11 (×2): qty 2

## 2022-02-11 MED ORDER — PANTOPRAZOLE SODIUM 40 MG PO TBEC: 40.0000 mg | DELAYED_RELEASE_TABLET | Freq: Every day | ORAL | 0 refills | Status: DC

## 2022-02-11 MED ORDER — FERROUS SULFATE 325 (65 FE) MG PO TABS: 325.0000 mg | ORAL_TABLET | Freq: Every day | ORAL | 0 refills | Status: DC

## 2022-02-11 MED ORDER — PANTOPRAZOLE SODIUM 40 MG PO TBEC
40.0000 mg | DELAYED_RELEASE_TABLET | Freq: Every day | ORAL | Status: DC
Start: 2022-02-11 — End: 2022-02-11
  Administered 2022-02-11: 40 mg via ORAL
  Filled 2022-02-11: qty 1

## 2022-02-11 MED ORDER — FUROSEMIDE 10 MG/ML IJ SOLN
20.0000 mg | Freq: Once | INTRAMUSCULAR | Status: AC
  Administered 2022-02-11: 20 mg via INTRAVENOUS
  Filled 2022-02-11: qty 2

## 2022-02-11 MED ORDER — ASPIRIN 81 MG PO CHEW: 81.0000 mg | CHEWABLE_TABLET | Freq: Every day | ORAL | Status: DC

## 2022-02-11 MED ORDER — FOLIC ACID 1 MG PO TABS: 1.0000 mg | ORAL_TABLET | Freq: Every day | ORAL | 0 refills | Status: DC

## 2022-02-11 NOTE — Plan of Care (Signed)

## 2022-02-11 NOTE — NC FL2 (Signed)
[place] LEVEL OF CARE SCREENING TOOL     IDENTIFICATION  Patient Name: [name] Birthdate: August 17, 1929 Sex: female Admission Date (Current Location): 02/10/2022  [facility] and [place] Number:  Herbalist and Address:  The . [facility], [place] [address] [place], [place] [address]      Provider Number: [id number]  Attending Physician Name and Address:  Thurnell Lose, MD  Relative Name and Phone Number:       Current Level of Care: Hospital Recommended Level of Care: Assisted Living Facility Prior Approval Number:    Date Approved/Denied:   PASRR Number:    Discharge Plan: Other (Comment) (ALF)    Current Diagnoses: Patient Active Problem List   Diagnosis Date Noted   DNR (do not resuscitate) 02/10/2022   IDA (iron deficiency anemia) 02/07/2022   Recurrent depression ([name]) 02/07/2022   Vascular dementia with behavior disturbance 11/23/2021   Grade I diastolic dysfunction 71/69/6789   Elevated troponin    Symptomatic anemia 09/20/2020   PAF (paroxysmal atrial fibrillation) ([name]) 09/20/2020   Dizzy 06/05/2013   Aortic stenosis, mild    CAD (coronary artery disease)    HTN (hypertension)    Hypercholesteremia    Anxiety and depression    Lateral myocardial infarction ([name])    Scoliosis    GERD 06/09/2009   PERSONAL HISTORY OF COLONIC POLYPS 06/09/2009    Orientation RESPIRATION BLADDER Height & Weight     Time, Self, Situation, Place  Normal Incontinent Weight: 128 lb 12 oz (58.4 kg) Height:  5\' 5"  (165.1 cm)  BEHAVIORAL SYMPTOMS/MOOD NEUROLOGICAL BOWEL NUTRITION STATUS        Diet (regular)  AMBULATORY STATUS COMMUNICATION OF NEEDS Skin   Limited Assist Verbally                         Personal Care Assistance Level of Assistance  Bathing, Feeding, Dressing Bathing Assistance: Limited assistance Feeding assistance: Independent Dressing Assistance: Limited assistance     Functional Limitations Info   Sight, Hearing, Speech Sight Info: Adequate Hearing Info: Adequate Speech Info: Adequate    SPECIAL CARE FACTORS FREQUENCY                       Contractures Contractures Info: Not present    Additional Factors Info  Code Status, Allergies Code Status Info: DNR Allergies Info: Full           Current Medications (02/11/2022):  This is the current hospital active medication list Current Facility-Administered Medications  Medication Dose Route Frequency Provider Last Rate Last Admin   acetaminophen (TYLENOL) tablet 650 mg  650 mg Oral Q6H PRN Karmen Bongo, MD       Or   acetaminophen (TYLENOL) suppository 650 mg  650 mg Rectal Q6H PRN Karmen Bongo, MD       amLODipine (NORVASC) tablet 5 mg  5 mg Oral QHS Karmen Bongo, MD   5 mg at 02/10/22 2114   atorvastatin (LIPITOR) tablet 40 mg  40 mg Oral Ivery Quale, MD   40 mg at 02/10/22 2114   ferrous sulfate tablet 325 mg  325 mg Oral BID WC Thurnell Lose, MD   325 mg at 38/10/17 5102   folic acid (FOLVITE) tablet 1 mg  1 mg Oral Daily Thurnell Lose, MD   1 mg at 02/11/22 0746   hydrALAZINE (APRESOLINE) injection 5 mg  5 mg Intravenous Q4H  PRN Karmen Bongo, MD       loratadine (CLARITIN) tablet 10 mg  10 mg Oral Daily Karmen Bongo, MD   10 mg at 02/11/22 0745   morphine (PF) 2 MG/ML injection 2 mg  2 mg Intravenous Q2H PRN Karmen Bongo, MD   2 mg at 02/11/22 0745   OLANZapine (ZYPREXA) tablet 2.5 mg  2.5 mg Oral Ivery Quale, MD   2.5 mg at 02/10/22 2121   ondansetron (ZOFRAN) tablet 4 mg  4 mg Oral Q6H PRN Karmen Bongo, MD       Or   ondansetron [facility]) injection 4 mg  4 mg Intravenous Q6H PRN Karmen Bongo, MD       pantoprazole (PROTONIX) EC tablet 40 mg  40 mg Oral Daily Thurnell Lose, MD   40 mg at 02/11/22 0746   sertraline (ZOLOFT) tablet 25 mg  25 mg Oral Ivery Quale, MD   25 mg at 02/10/22 2114   sodium chloride flush (NS) 0.9 % injection 3 mL  3 mL Intravenous  Lillia Mountain, MD   3 mL at 02/11/22 0747     Discharge Medications: TAKE these medications     acetaminophen 500 MG tablet Commonly known as: TYLENOL Take 1,000 mg by mouth 2 (two) times daily.    amLODipine 5 MG tablet Commonly known as: NORVASC Take 5 mg by mouth at bedtime.    aspirin 81 MG chewable tablet Chew 1 tablet (81 mg total) by mouth at bedtime. Start taking on: February 24, 2022 What changed: These instructions start on February 24, 2022. If you are unsure what to do until then, ask your doctor or other care provider.    atorvastatin 40 MG tablet Commonly known as: LIPITOR TAKE 1 TABLET DAILY What changed: when to take this    cetirizine 10 MG tablet Commonly known as: ZYRTEC Take 10 mg by mouth every morning.    cholecalciferol 25 MCG (1000 UNIT) tablet Commonly known as: VITAMIN D Take 2,000 Units by mouth every evening.    ferrous sulfate 325 (65 FE) MG tablet Take 1 tablet (325 mg total) by mouth daily with breakfast.    folic acid 1 MG tablet Commonly known as: FOLVITE Take 1 tablet (1 mg total) by mouth daily. Start taking on: February 12, 2022    hydrochlorothiazide 25 MG tablet Commonly known as: HYDRODIURIL TAKE 1 TABLET DAILY What changed: when to take this    losartan 100 MG tablet Commonly known as: COZAAR Take 1 tablet (100 mg total) by mouth daily. What changed: when to take this    metoprolol succinate 50 MG 24 hr tablet Commonly known as: TOPROL-XL TAKE 1 TABLET EVERY DAY    WITH OR RIGHT AFTER A MEAL What changed:  how much to take when to take this additional instructions    nitroGLYCERIN 0.4 MG SL tablet Commonly known as: Nitrostat Place 1 tablet (0.4 mg total) under the tongue every 5 (five) minutes as needed for chest pain.    OLANZapine 2.5 MG tablet Commonly known as: ZYPREXA Take 2.5 mg by mouth at bedtime.    pantoprazole 40 MG tablet Commonly known as: PROTONIX Take 1 tablet (40 mg total) by mouth  daily. Start taking on: February 12, 2022    sertraline 25 MG tablet Commonly known as: ZOLOFT Take 25 mg by mouth at bedtime    Relevant Imaging Results:  Relevant Lab Results:   Additional Information    [name],

## 2022-02-11 NOTE — TOC Transition Note (Signed)
Transition of Care [facility]) - CM/SW Discharge Note   Patient Details  Name: [name] MRN: [id number] Date of Birth: June 16, 1929  Transition of Care [facility] CM/SW Contact:  Emeterio Reeve, LCSW Phone Number: 02/11/2022, 3:52 PM   Clinical Narrative:       Per MD patient ready for DC to [facility]F. RN, patient, patient's family, and facility notified of DC. Discharge Summary and FL2 sent to facility. Pts niece will transport in private car.    RN to call report to [phone].  CSW will sign off for now as social work intervention is no longer needed. Please consult [place] again if new needs arise.   Final next level of care: Skilled Nursing Facility Barriers to Discharge: No Barriers Identified   Patient Goals and CMS Choice        Discharge Placement              Patient chooses bed at: [facility] Patient to be transferred to facility by: family care Name of family member notified: niece Patient and family notified of of transfer: 02/11/22  Discharge Plan and Services                                     Social Determinants of Health (SDOH) Interventions     Readmission Risk Interventions No flowsheet data found.   Emeterio Reeve, LCSW Clinical Social Worker

## 2022-02-11 NOTE — Discharge Summary (Signed)
[name]:[id number] DOB: Jan 13, 1929 DOA: 02/10/2022  PCP: Erin Dad, MD  Admit date: 02/10/2022  Discharge date: 02/11/2022  Admitted From: ALF   Disposition:  ALF   Recommendations for Outpatient Follow-up:   Follow up with PCP in 1-2 weeks  PCP Please obtain BMP/CBC, 2 view CXR in 1week,  (see Discharge instructions)   PCP Please follow up on the following pending results: monitor CBC, BMP, Mag closely   Home Health: None   Equipment/Devices: 02 if qualifies  Consultations: None  Discharge Condition: Stable    CODE STATUS: Full    Diet Recommendation: Heart Healthy   Diet Order             Diet regular Room service appropriate? Yes; Fluid consistency: Thin  Diet effective now                    Chief Complaint  Patient presents with   Abnormal Lab     Brief history of present illness from the day of admission and additional interim summary    [name] is a 88 y.o. female with medical history significant of CAD s/p stent; dementia; HTN; and HLD presenting with symptomatic anemia.  She was previously admitted with this issue in 08/2020 and declined GI work-up at that time; she received PRBCs and IV iron during that hospitalization. At this time, the patient denies any symptoms (has dementia).  She remains adamant about no work-up.  She wants to receive blood products and be discharged asap, willing to wait until tomorrow.                                                                 Hospital Course   Symptomatic anemia- (present on admission) -Patient with prior admission for presumed GI bleeding and refused work-up -Per facility, she has been having symptoms c/w anemia and so they did blood work and confirmed (patient denies any symptoms) -Hgb 6.1; 12.3 on 12/1 -Hemoccult  test was positive but she refused any further work-up, niece aware, she had similar issue few years back and declined GI work-up at that time as well.  She is s/p 2 units of packed RBC transfusion with stable posttransfusion H&H, no signs of ongoing brisk GI bleed. Will be placed on combination of PPI along with iron and folic acid, resume aspirin after 2 weeks if desired by PCP.  Monitor CBC intermittently, may benefit from planned outpatient transfusions to avoid admissions in the future     Grade I diastolic dysfunction -Appears to be compensated at this time   Vascular dementia with behavior disturbance- (present on admission) -Continue Zoloft and Zyprexa     Hypercholesteremia- (present on admission) -Continue Lipitor f    HTN (hypertension)- (present on admission) -resumed Home Rx, PCP  to monitor   CAD (coronary artery disease)- (present on admission) -continue home dose ASA, add PPI.  Hypokalemia.  Replaced.   Discharge diagnosis     Principal Problem:   Symptomatic anemia Active Problems:   CAD (coronary artery disease)   HTN (hypertension)   Hypercholesteremia   Vascular dementia with behavior disturbance   Grade I diastolic dysfunction   DNR (do not resuscitate)    Discharge instructions    Discharge Instructions     Discharge instructions   Complete by: As directed    Follow with Primary MD Erin Dad, MD in 7 days   Get CBC, BMP, Magnesium -  checked next visit within 2 days by Primary MD    Activity: As tolerated with Full fall precautions use walker/cane & assistance as needed  Disposition Home   Diet: Heart Healthy    Special Instructions: If you have smoked or chewed Tobacco  in the last 2 yrs please stop smoking, stop any regular Alcohol  and or any Recreational drug use.  On your next visit with your primary care physician please Get Medicines reviewed and adjusted.  Please request your Prim.MD to go over all Hospital Tests and  Procedure/Radiological results at the follow up, please get all Hospital records sent to your Prim MD by signing hospital release before you go home.  If you experience worsening of your admission symptoms, develop shortness of breath, life threatening emergency, suicidal or homicidal thoughts you must seek medical attention immediately by calling 911 or calling your MD immediately  if symptoms less severe.  You Must read complete instructions/literature along with all the possible adverse reactions/side effects for all the Medicines you take and that have been prescribed to you. Take any new Medicines after you have completely understood and accpet all the possible adverse reactions/side effects.   Increase activity slowly   Complete by: As directed        Discharge Medications   Allergies as of 02/11/2022       Reactions   Latex Other (See Comments)   Unknown reaction - listed on [facility] 02/10/22   Nifedipine Other (See Comments)   Dark Urine; can only take orange tablet, allergic to yellow-brown tablet   Tetanus Toxoids Other (See Comments)   Unknown reaction   Tetanus-diphtheria Toxoids Td Other (See Comments)   Unknown reaction   Adhesive [tape] Rash        Medication List     TAKE these medications    acetaminophen 500 MG tablet Commonly known as: TYLENOL Take 1,000 mg by mouth 2 (two) times daily.   amLODipine 5 MG tablet Commonly known as: NORVASC Take 5 mg by mouth at bedtime.   aspirin 81 MG chewable tablet Chew 1 tablet (81 mg total) by mouth at bedtime. Start taking on: February 24, 2022 What changed: These instructions start on February 24, 2022. If you are unsure what to do until then, ask your doctor or other care provider.   atorvastatin 40 MG tablet Commonly known as: LIPITOR TAKE 1 TABLET DAILY What changed: when to take this   cetirizine 10 MG tablet Commonly known as: ZYRTEC Take 10 mg by mouth every morning.   cholecalciferol 25 MCG (1000 UNIT)  tablet Commonly known as: VITAMIN D Take 2,000 Units by mouth every evening.   ferrous sulfate 325 (65 FE) MG tablet Take 1 tablet (325 mg total) by mouth daily with breakfast.   folic acid 1 MG tablet Commonly known as: FOLVITE Take  1 tablet (1 mg total) by mouth daily. Start taking on: February 12, 2022   hydrochlorothiazide 25 MG tablet Commonly known as: HYDRODIURIL TAKE 1 TABLET DAILY What changed: when to take this   losartan 100 MG tablet Commonly known as: COZAAR Take 1 tablet (100 mg total) by mouth daily. What changed: when to take this   metoprolol succinate 50 MG 24 hr tablet Commonly known as: TOPROL-XL TAKE 1 TABLET EVERY DAY    WITH OR RIGHT AFTER A MEAL What changed:  how much to take when to take this additional instructions   nitroGLYCERIN 0.4 MG SL tablet Commonly known as: Nitrostat Place 1 tablet (0.4 mg total) under the tongue every 5 (five) minutes as needed for chest pain.   OLANZapine 2.5 MG tablet Commonly known as: ZYPREXA Take 2.5 mg by mouth at bedtime.   pantoprazole 40 MG tablet Commonly known as: PROTONIX Take 1 tablet (40 mg total) by mouth daily. Start taking on: February 12, 2022   sertraline 25 MG tablet Commonly known as: ZOLOFT Take 25 mg by mouth at bedtime.         Follow-up Information     Erin Dad, MD. Schedule an appointment as soon as possible for a visit in 2 day(s).   Specialty: Internal Medicine Contact information: [place] [address] [phone]         Martinique, Peter M, MD. Schedule an appointment as soon as possible for a visit in 1 week(s).   Specialty: Cardiology Contact information: [address] [place] [address] [phone]                 Major procedures and Radiology Reports - PLEASE review detailed and final reports thoroughly  -        Today   Subjective    Phyllicia Dudek today has no headache,no chest abdominal pain,no new  weakness tingling or numbness, feels much better wants to go home today.     Objective   Blood pressure (!) 144/62, pulse (!) 57, temperature 97.8 F (36.6 C), temperature source Oral, resp. rate 17, height 5\' 5"  (1.651 m), weight 58.4 kg, SpO2 (!) 89 %.   Intake/Output Summary (Last 24 hours) at 02/11/2022 1011 Last data filed at 02/11/2022 0745 Gross per 24 hour  Intake 1195 ml  Output 800 ml  Net 395 ml    Exam  Awake , mildly confused No new F.N deficits,    [place].AT,PERRAL Supple Neck,   Symmetrical Chest wall movement, Good air movement bilaterally, CTAB RRR,No Gallops,   +ve B.Sounds, Abd Soft, Non tender,  No Cyanosis, Clubbing or edema    Data Review   CBC w Diff:  Lab Results  Component Value Date   WBC 5.8 02/11/2022   HGB 8.0 (L) 02/11/2022   HGB 9.1 (L) 09/17/2020   HCT 26.6 (L) 02/11/2022   HCT 35.6 09/21/2020   PLT 216 02/11/2022   PLT 262 09/17/2020   LYMPHOPCT 12 02/11/2022   MONOPCT 14 02/11/2022   EOSPCT 3 02/11/2022   BASOPCT 0 02/11/2022    CMP:  Lab Results  Component Value Date   NA 138 02/11/2022   NA 141 11/24/2021   K 3.0 (L) 02/11/2022   CL 106 02/11/2022   CO2 24 02/11/2022   BUN 11 02/11/2022   BUN 12 11/24/2021   CREATININE 0.64 02/11/2022   GLU 87 11/24/2021   PROT 6.3 (L) 02/10/2022   PROT 6.7 09/17/2020  ALBUMIN 3.2 (L) 02/10/2022   ALBUMIN 3.9 09/17/2020   BILITOT 0.5 02/10/2022   BILITOT 0.2 09/17/2020   ALKPHOS 97 02/10/2022   AST 28 02/10/2022   ALT 25 02/10/2022  .   Total Time in preparing paper work, data evaluation and todays exam - 98 minutes  Lala Lund M.D on 02/11/2022 at 10:11 AM  [facility]

## 2022-02-11 NOTE — Discharge Instructions (Addendum)
Follow with Primary MD [name], MD in 7 days   Get CBC, BMP, Magnesium -  checked next visit within 2 days by Primary MD    Activity: As tolerated with Full fall precautions use walker/cane & assistance as needed  Disposition Home   Diet: Heart Healthy    Special Instructions: If you have smoked or chewed Tobacco  in the last 2 yrs please stop smoking, stop any regular Alcohol  and or any Recreational drug use.  On your next visit with your primary care physician please Get Medicines reviewed and adjusted.  Please request your Prim.MD to go over all Hospital Tests and Procedure/Radiological results at the follow up, please get all Hospital records sent to your Prim MD by signing hospital release before you go home.  If you experience worsening of your admission symptoms, develop shortness of breath, life threatening emergency, suicidal or homicidal thoughts you must seek medical attention immediately by calling 911 or calling your MD immediately  if symptoms less severe.  You Must read complete instructions/literature along with all the possible adverse reactions/side effects for all the Medicines you take and that have been prescribed to you. Take any new Medicines after you have completely understood and accpet all the possible adverse reactions/side effects.

## 2022-02-11 NOTE — Progress Notes (Signed)
Orders received to discharge patient to [facility]F. PIV and telemetry removed. Discharge instructions reviewed with patient and her niece. Report called to ALF and given to Maudie Mercury, Therapist, sports. Full assessment, vitals, medication schedule, and discharge instructions reviewed with receiving care RN. Provided number to call if any questions arise. No questions at this time. Patient will be transported via private vehicle with niece to facility.

## 2022-02-13 ENCOUNTER — Encounter: Payer: Self-pay | Admitting: Nurse Practitioner

## 2022-02-13 ENCOUNTER — Non-Acute Institutional Stay: Payer: Medicare Other | Admitting: Orthopedic Surgery

## 2022-02-13 ENCOUNTER — Encounter: Payer: Self-pay | Admitting: Orthopedic Surgery

## 2022-02-13 DIAGNOSIS — I2510 Atherosclerotic heart disease of native coronary artery without angina pectoris: Secondary | ICD-10-CM | POA: Diagnosis not present

## 2022-02-13 DIAGNOSIS — Z9181 History of falling: Secondary | ICD-10-CM | POA: Diagnosis not present

## 2022-02-13 DIAGNOSIS — I2583 Coronary atherosclerosis due to lipid rich plaque: Secondary | ICD-10-CM

## 2022-02-13 DIAGNOSIS — R2681 Unsteadiness on feet: Secondary | ICD-10-CM | POA: Diagnosis not present

## 2022-02-13 DIAGNOSIS — E7800 Pure hypercholesterolemia, unspecified: Secondary | ICD-10-CM

## 2022-02-13 DIAGNOSIS — F419 Anxiety disorder, unspecified: Secondary | ICD-10-CM

## 2022-02-13 DIAGNOSIS — D649 Anemia, unspecified: Secondary | ICD-10-CM

## 2022-02-13 DIAGNOSIS — I10 Essential (primary) hypertension: Secondary | ICD-10-CM | POA: Diagnosis not present

## 2022-02-13 DIAGNOSIS — M6389 Disorders of muscle in diseases classified elsewhere, multiple sites: Secondary | ICD-10-CM | POA: Diagnosis not present

## 2022-02-13 DIAGNOSIS — M6281 Muscle weakness (generalized): Secondary | ICD-10-CM | POA: Diagnosis not present

## 2022-02-13 DIAGNOSIS — F01518 Vascular dementia, unspecified severity, with other behavioral disturbance: Secondary | ICD-10-CM

## 2022-02-13 DIAGNOSIS — I5189 Other ill-defined heart diseases: Secondary | ICD-10-CM | POA: Diagnosis not present

## 2022-02-13 DIAGNOSIS — I480 Paroxysmal atrial fibrillation: Secondary | ICD-10-CM

## 2022-02-13 DIAGNOSIS — M810 Age-related osteoporosis without current pathological fracture: Secondary | ICD-10-CM | POA: Diagnosis not present

## 2022-02-13 DIAGNOSIS — F32A Depression, unspecified: Secondary | ICD-10-CM

## 2022-02-13 DIAGNOSIS — R296 Repeated falls: Secondary | ICD-10-CM | POA: Diagnosis not present

## 2022-02-13 NOTE — Progress Notes (Signed)
Location:  [place] Room Number: [place] of Service:  ALF [phone]) Provider: [name], NP   Patient Care Team: [name], MD as PCP - General (Internal Medicine) Martinique, Peter M, MD as PCP - Cardiology (Cardiology)  Extended Emergency Contact Information Primary Emergency Contact: [name],Patricia Address: [phone] [place]          [place] [address] [name] of [place] Phone: [phone] Mobile Phone: [phone] Relation: Sister Secondary Emergency Contact: [name],[name]  [name] of [place] Phone: [phone] Mobile Phone: [phone] Relation: Niece  Code Status:  Full code Goals of care: Advanced Directive information Advanced Directives 02/13/2022  Does Patient Have a Medical Advance Directive? Yes  Type of Paramedic of [place];Living will  Does patient want to make changes to medical advance directive? No - Patient declined  Copy of [place] in Chart? Yes - validated most recent copy scanned in chart (See row information)  Would patient like information on creating a medical advance directive? -     Chief Complaint  Patient presents with   [facility] follow up    Acute Visit    Low hgb    HPI:  Pt is a 88 y.o. female seen today for f/u s/p hospitalization at [facility] 02/17-02/18.   02/16 hgb 6.3- confirmed with repeated H&H. She was sent to the ED for further follow up. Hgb 6.1, hemoccult was positive, she refused GI consult. She received 2 units PRBC, posttransfusion H&H 8.1 02/18. She was placed on PPI, ferrous sulfate and folic acid. Aspirin held x 2 weeks. Also advised to f/u with cardiology. She was discharged back to [facility].   Today, she is a poor historian of her recent ED visit. She does not recall refusing GI consult. She denies feeling chest pain, sob, dizziness, or light headedness. Discussed scheduling GI consult outpatient and  she is  agreeable. Referral made to Dr. Fuller Plan office, sister/HPOA updated.   Dementia- 04/2021 CT head noted chronic vascular changes, MMSE 16/30, no behavioral outbursts, remains on Zyprexa and Sertaline HTN- BUN/creat 11/0.64 02/18, remains on amlodipine, HCTZ, losartan, and metoprolol Grade I diastolic dysfunction- 60/1093 echo LVEF 65-70% PAF- HR controlled with metoprolol, not on anticoagulation due to falls CAD- hx demand ischemia, stent placement 1992 & 2005, remains on asa and metoprolol- asa on hold due to low hemoglobin x 2 weeks HLD- LDL 27 12/2021, remains on statin Depression- no mood changes, remains on sertraline    Past Medical History:  Diagnosis Date   Anxiety and depression    Aortic stenosis    a. 03/2014: Mild aortic stenosis by valve area and mean gradient though appeared more visually consistent with moderate AS.    CAD (coronary artery disease)    a. s/p prior PCI of D2 in 1992 b. stent to RCA in 2005   Diverticula of colon    GERD (gastroesophageal reflux disease)    HTN (hypertension)    Hypercholesteremia    Lateral myocardial infarction [facility]) 1992   Osteoarthritis    Osteopenia    Scoliosis    Past Surgical History:  Procedure Laterality Date   ABDOMINAL HYSTERECTOMY     APPENDECTOMY     CORONARY ANGIOPLASTY     TONSILLECTOMY      Allergies  Allergen Reactions   Latex Other (See Comments)    Unknown reaction - listed on [facility] 02/10/22   Nifedipine Other (See Comments)  Dark Urine; can only take orange tablet, allergic to yellow-brown tablet   Tetanus Toxoids Other (See Comments)    Unknown reaction   Tetanus-Diphtheria Toxoids Td Other (See Comments)    Unknown reaction   Adhesive [Tape] Rash    Outpatient Encounter Medications as of 02/13/2022  Medication Sig   acetaminophen (TYLENOL) 500 MG tablet Take 1,000 mg by mouth 2 (two) times daily.   amLODipine (NORVASC) 5 MG tablet Take 5 mg by mouth at bedtime.   [START ON 02/24/2022] aspirin  81 MG chewable tablet Chew 1 tablet (81 mg total) by mouth at bedtime.   atorvastatin (LIPITOR) 40 MG tablet Take 40 mg by mouth at bedtime.   cetirizine (ZYRTEC) 10 MG tablet Take 10 mg by mouth every morning.   cholecalciferol (VITAMIN D) 25 MCG (1000 UNIT) tablet Take 1,000 Units by mouth every evening.   ferrous sulfate 325 (65 FE) MG tablet Take 1 tablet (325 mg total) by mouth daily with breakfast.   folic acid (FOLVITE) 1 MG tablet Take 1 tablet (1 mg total) by mouth daily.   hydrochlorothiazide (HYDRODIURIL) 25 MG tablet Take 25 mg by mouth in the morning.   losartan (COZAAR) 100 MG tablet Take 100 mg by mouth at bedtime.   metoprolol succinate (TOPROL-XL) 50 MG 24 hr tablet Take 50 mg by mouth every evening. Take with or immediately following a meal.   nitroGLYCERIN (NITROSTAT) 0.4 MG SL tablet Place 1 tablet (0.4 mg total) under the tongue every 5 (five) minutes as needed for chest pain.   OLANZapine (ZYPREXA) 2.5 MG tablet Take 2.5 mg by mouth at bedtime.   pantoprazole (PROTONIX) 40 MG tablet Take 1 tablet (40 mg total) by mouth daily.   senna (SENOKOT) 8.6 MG TABS tablet Take 1 tablet by mouth in the morning.   sertraline (ZOLOFT) 25 MG tablet Take 25 mg by mouth at bedtime.   [DISCONTINUED] atorvastatin (LIPITOR) 40 MG tablet TAKE 1 TABLET DAILY (Patient taking differently: Take 40 mg by mouth at bedtime.)   [DISCONTINUED] hydrochlorothiazide (HYDRODIURIL) 25 MG tablet TAKE 1 TABLET DAILY (Patient taking differently: Take 25 mg by mouth every morning.)   [DISCONTINUED] losartan (COZAAR) 100 MG tablet Take 1 tablet (100 mg total) by mouth daily. (Patient taking differently: Take 100 mg by mouth at bedtime.)   [DISCONTINUED] metoprolol succinate (TOPROL-XL) 50 MG 24 hr tablet TAKE 1 TABLET EVERY DAY    WITH OR RIGHT AFTER A MEAL (Patient taking differently: 50 mg at bedtime.)   No facility-administered encounter medications on file as of 02/13/2022.    Review of Systems  Unable to  perform ROS: Dementia   Immunization History  Administered Date(s) Administered   Influenza Split 05/13/2009, 05/18/2010, 09/29/2011, 09/05/2012, 09/03/2013   Influenza, High Dose Seasonal PF 09/09/2015, 08/30/2017   Influenza, Quadrivalent, Recombinant, Inj, Pf 08/19/2018, 09/29/2019   Influenza-Unspecified 10/18/2021   Pneumococcal Conjugate-13 07/08/2013   Pneumococcal Polysaccharide-23 06/29/2006, 05/13/2009, 05/18/2010, 05/25/2011   Td 05/13/2009, 05/18/2010, 05/25/2011   Zoster Recombinat (Shingrix) 08/24/2017, 10/31/2017   Zoster, Live 08/22/2008, 08/24/2008, 05/13/2009, 05/18/2010, 05/25/2011   Pertinent  Health Maintenance Due  Topic Date Due   INFLUENZA VACCINE  Completed   DEXA SCAN  Completed   Fall Risk 04/23/2021 05/02/2021 12/14/2021 02/10/2022 02/11/2022  Patient Fall Risk Level Low fall risk High fall risk Low fall risk Moderate fall risk Moderate fall risk   Functional Status Survey:    Vitals:   02/13/22 1356  BP: (!) 144/76  Pulse: 66  Resp:  16  Temp: (!) 97 F (36.1 C)  SpO2: 90%  Weight: 123 lb (55.8 kg)  Height: 5\' 5"  (1.651 m)   Body mass index is 20.47 kg/m. Physical Exam Vitals reviewed.  Constitutional:      General: She is not in acute distress. HENT:     Head: Normocephalic.  Eyes:     General:        Right eye: No discharge.        Left eye: No discharge.  Cardiovascular:     Rate and Rhythm: Normal rate. Rhythm irregular.     Pulses: Normal pulses.     Heart sounds: Murmur heard.  Pulmonary:     Effort: Pulmonary effort is normal. No respiratory distress.     Breath sounds: Normal breath sounds. No wheezing.  Abdominal:     General: Bowel sounds are normal. There is no distension.     Palpations: Abdomen is soft.     Tenderness: There is no abdominal tenderness.  Musculoskeletal:     Cervical back: Neck supple.     Right lower leg: No edema.     Left lower leg: No edema.  Skin:    General: Skin is warm and dry.     Capillary  Refill: Capillary refill takes less than 2 seconds.  Neurological:     General: No focal deficit present.     Mental Status: She is alert. Mental status is at baseline.     Motor: Weakness present.     Gait: Gait abnormal.     Comments: Walker  Psychiatric:        Mood and Affect: Mood normal.        Behavior: Behavior normal.        Cognition and Memory: Memory is impaired.    Labs reviewed: Recent Labs    05/02/21 1032 11/24/21 0000 02/10/22 1303 02/11/22 0108  NA 140 141 137 138  K 3.2* 4.4 3.4* 3.0*  CL 104 106 104 106  CO2 27 27* 24 24  GLUCOSE 93  --  98 110*  BUN 15 12 16 11   CREATININE 0.59 0.4* 0.74 0.64  CALCIUM 8.9 8.3* 8.5* 8.3*   Recent Labs    05/02/21 1032 11/24/21 0000 02/10/22 1303  AST 30 16 28   ALT 18 12 25   ALKPHOS 101 106 97  BILITOT 0.7  --  0.5  PROT 7.6  --  6.3*  ALBUMIN 4.0 3.2* 3.2*   Recent Labs    05/02/21 1032 11/24/21 0000 02/10/22 1303 02/11/22 0108  WBC 7.2 4.9 4.6 5.8  NEUTROABS 5.6  --  2.8 4.1  HGB 13.9 12.3 6.1* 8.0*  HCT 44.9 38 21.8* 26.6*  MCV 92.2  --  82.0 80.4  PLT 235 366 253 216   Lab Results  Component Value Date   TSH 2.90 11/24/2021   No results found for: HGBA1C Lab Results  Component Value Date   CHOL 86 01/23/2022   HDL 43 01/23/2022   LDLCALC 27 01/23/2022   TRIG 77 01/23/2022   CHOLHDL 1.9 09/17/2020    Significant Diagnostic Results in last 30 days:  No results found.  Assessment/Plan 1. Symptomatic anemia - 02/17 hgb 6.3/ 6.1 during hospitalization- now 8.0 posttransfusion - received 2 units PRBC - suspect GI bleed but refused GI consult in hospital - GI consult ordered - cont PPI, ferrous sulfate and folic acid - repeat cbc/diff, bmp in 1 week  2. Vascular dementia with behavior disturbance - no behavioral  outbursts - cont Zyprexa and zoloft  3. Primary hypertension - controlled  - cont amlodipine, HCTZ, losartan, and metoprolol  4. Grade I diastolic dysfunction - followed  by cardiology - no further workup at this time  5. PAF (paroxysmal atrial fibrillation) (HCC) - HR controlled with metoprolol - asa held due to low hgb x 2 weeks  6. Coronary artery disease due to lipid rich plaque - stent placement 1992 and 2005 - cont statin  7. Hypercholesteremia - LDL at goal  - cont statin  8. Anxiety and depression - no mood changes - cont Zoloft    Family/ staff Communication: plan discussed with patient and nurse  Labs/tests ordered:  GI referral, cbc/diff, bmp, magnesium, CXR in 1 week

## 2022-02-14 DIAGNOSIS — R296 Repeated falls: Secondary | ICD-10-CM | POA: Diagnosis not present

## 2022-02-14 DIAGNOSIS — M6389 Disorders of muscle in diseases classified elsewhere, multiple sites: Secondary | ICD-10-CM | POA: Diagnosis not present

## 2022-02-14 DIAGNOSIS — R2681 Unsteadiness on feet: Secondary | ICD-10-CM | POA: Diagnosis not present

## 2022-02-14 DIAGNOSIS — Z9181 History of falling: Secondary | ICD-10-CM | POA: Diagnosis not present

## 2022-02-14 DIAGNOSIS — M810 Age-related osteoporosis without current pathological fracture: Secondary | ICD-10-CM | POA: Diagnosis not present

## 2022-02-14 DIAGNOSIS — M6281 Muscle weakness (generalized): Secondary | ICD-10-CM | POA: Diagnosis not present

## 2022-02-15 DIAGNOSIS — M6389 Disorders of muscle in diseases classified elsewhere, multiple sites: Secondary | ICD-10-CM | POA: Diagnosis not present

## 2022-02-15 DIAGNOSIS — M6281 Muscle weakness (generalized): Secondary | ICD-10-CM | POA: Diagnosis not present

## 2022-02-15 DIAGNOSIS — R2681 Unsteadiness on feet: Secondary | ICD-10-CM | POA: Diagnosis not present

## 2022-02-15 DIAGNOSIS — M810 Age-related osteoporosis without current pathological fracture: Secondary | ICD-10-CM | POA: Diagnosis not present

## 2022-02-15 DIAGNOSIS — R296 Repeated falls: Secondary | ICD-10-CM | POA: Diagnosis not present

## 2022-02-15 DIAGNOSIS — Z9181 History of falling: Secondary | ICD-10-CM | POA: Diagnosis not present

## 2022-02-16 DIAGNOSIS — R2681 Unsteadiness on feet: Secondary | ICD-10-CM | POA: Diagnosis not present

## 2022-02-16 DIAGNOSIS — M810 Age-related osteoporosis without current pathological fracture: Secondary | ICD-10-CM | POA: Diagnosis not present

## 2022-02-16 DIAGNOSIS — Z9181 History of falling: Secondary | ICD-10-CM | POA: Diagnosis not present

## 2022-02-16 DIAGNOSIS — M6281 Muscle weakness (generalized): Secondary | ICD-10-CM | POA: Diagnosis not present

## 2022-02-16 DIAGNOSIS — M6389 Disorders of muscle in diseases classified elsewhere, multiple sites: Secondary | ICD-10-CM | POA: Diagnosis not present

## 2022-02-16 DIAGNOSIS — R296 Repeated falls: Secondary | ICD-10-CM | POA: Diagnosis not present

## 2022-02-18 DIAGNOSIS — Z9181 History of falling: Secondary | ICD-10-CM | POA: Diagnosis not present

## 2022-02-18 DIAGNOSIS — M810 Age-related osteoporosis without current pathological fracture: Secondary | ICD-10-CM | POA: Diagnosis not present

## 2022-02-18 DIAGNOSIS — R2681 Unsteadiness on feet: Secondary | ICD-10-CM | POA: Diagnosis not present

## 2022-02-18 DIAGNOSIS — R296 Repeated falls: Secondary | ICD-10-CM | POA: Diagnosis not present

## 2022-02-18 DIAGNOSIS — M6389 Disorders of muscle in diseases classified elsewhere, multiple sites: Secondary | ICD-10-CM | POA: Diagnosis not present

## 2022-02-18 DIAGNOSIS — M6281 Muscle weakness (generalized): Secondary | ICD-10-CM | POA: Diagnosis not present

## 2022-02-20 DIAGNOSIS — I10 Essential (primary) hypertension: Secondary | ICD-10-CM | POA: Diagnosis not present

## 2022-02-20 DIAGNOSIS — M6281 Muscle weakness (generalized): Secondary | ICD-10-CM | POA: Diagnosis not present

## 2022-02-20 DIAGNOSIS — M6389 Disorders of muscle in diseases classified elsewhere, multiple sites: Secondary | ICD-10-CM | POA: Diagnosis not present

## 2022-02-20 DIAGNOSIS — R2681 Unsteadiness on feet: Secondary | ICD-10-CM | POA: Diagnosis not present

## 2022-02-20 DIAGNOSIS — R0989 Other specified symptoms and signs involving the circulatory and respiratory systems: Secondary | ICD-10-CM | POA: Diagnosis not present

## 2022-02-20 DIAGNOSIS — R296 Repeated falls: Secondary | ICD-10-CM | POA: Diagnosis not present

## 2022-02-20 DIAGNOSIS — Z9181 History of falling: Secondary | ICD-10-CM | POA: Diagnosis not present

## 2022-02-20 DIAGNOSIS — M810 Age-related osteoporosis without current pathological fracture: Secondary | ICD-10-CM | POA: Diagnosis not present

## 2022-02-20 DIAGNOSIS — J439 Emphysema, unspecified: Secondary | ICD-10-CM | POA: Diagnosis not present

## 2022-02-20 LAB — CBC: RBC: 3.82 — AB (ref 3.87–5.11)

## 2022-02-20 LAB — CBC AND DIFFERENTIAL
HCT: 31 — AB (ref 36–46)
Hemoglobin: 9.1 — AB (ref 12.0–16.0)
Neutrophils Absolute: 2071
Platelets: 291 10*3/uL (ref 150–400)
WBC: 3.9

## 2022-02-20 NOTE — Progress Notes (Signed)
Error - patient cancelled appt. Marked as no charge.   Loel Dubonnet, NP

## 2022-02-21 ENCOUNTER — Encounter: Payer: Self-pay | Admitting: Nurse Practitioner

## 2022-02-21 ENCOUNTER — Ambulatory Visit (INDEPENDENT_AMBULATORY_CARE_PROVIDER_SITE_OTHER): Payer: Medicare Other | Admitting: Family

## 2022-02-21 DIAGNOSIS — I480 Paroxysmal atrial fibrillation: Secondary | ICD-10-CM

## 2022-02-21 DIAGNOSIS — J439 Emphysema, unspecified: Secondary | ICD-10-CM | POA: Insufficient documentation

## 2022-02-22 DIAGNOSIS — R296 Repeated falls: Secondary | ICD-10-CM | POA: Diagnosis not present

## 2022-02-22 DIAGNOSIS — R2681 Unsteadiness on feet: Secondary | ICD-10-CM | POA: Diagnosis not present

## 2022-02-22 DIAGNOSIS — R41841 Cognitive communication deficit: Secondary | ICD-10-CM | POA: Diagnosis not present

## 2022-02-22 DIAGNOSIS — M810 Age-related osteoporosis without current pathological fracture: Secondary | ICD-10-CM | POA: Diagnosis not present

## 2022-02-22 DIAGNOSIS — M6389 Disorders of muscle in diseases classified elsewhere, multiple sites: Secondary | ICD-10-CM | POA: Diagnosis not present

## 2022-02-22 DIAGNOSIS — Z9181 History of falling: Secondary | ICD-10-CM | POA: Diagnosis not present

## 2022-02-22 DIAGNOSIS — M6281 Muscle weakness (generalized): Secondary | ICD-10-CM | POA: Diagnosis not present

## 2022-02-22 DIAGNOSIS — R278 Other lack of coordination: Secondary | ICD-10-CM | POA: Diagnosis not present

## 2022-02-23 DIAGNOSIS — M6281 Muscle weakness (generalized): Secondary | ICD-10-CM | POA: Diagnosis not present

## 2022-02-23 DIAGNOSIS — R2681 Unsteadiness on feet: Secondary | ICD-10-CM | POA: Diagnosis not present

## 2022-02-23 DIAGNOSIS — R41841 Cognitive communication deficit: Secondary | ICD-10-CM | POA: Diagnosis not present

## 2022-02-23 DIAGNOSIS — M810 Age-related osteoporosis without current pathological fracture: Secondary | ICD-10-CM | POA: Diagnosis not present

## 2022-02-23 DIAGNOSIS — Z9181 History of falling: Secondary | ICD-10-CM | POA: Diagnosis not present

## 2022-02-23 DIAGNOSIS — R296 Repeated falls: Secondary | ICD-10-CM | POA: Diagnosis not present

## 2022-02-24 DIAGNOSIS — R296 Repeated falls: Secondary | ICD-10-CM | POA: Diagnosis not present

## 2022-02-24 DIAGNOSIS — R41841 Cognitive communication deficit: Secondary | ICD-10-CM | POA: Diagnosis not present

## 2022-02-24 DIAGNOSIS — R2681 Unsteadiness on feet: Secondary | ICD-10-CM | POA: Diagnosis not present

## 2022-02-24 DIAGNOSIS — M6281 Muscle weakness (generalized): Secondary | ICD-10-CM | POA: Diagnosis not present

## 2022-02-24 DIAGNOSIS — Z9181 History of falling: Secondary | ICD-10-CM | POA: Diagnosis not present

## 2022-02-24 DIAGNOSIS — M810 Age-related osteoporosis without current pathological fracture: Secondary | ICD-10-CM | POA: Diagnosis not present

## 2022-02-27 ENCOUNTER — Encounter: Payer: Self-pay | Admitting: Orthopedic Surgery

## 2022-02-27 ENCOUNTER — Non-Acute Institutional Stay: Payer: Medicare Other | Admitting: Orthopedic Surgery

## 2022-02-27 DIAGNOSIS — F01518 Vascular dementia, unspecified severity, with other behavioral disturbance: Secondary | ICD-10-CM | POA: Diagnosis not present

## 2022-02-27 DIAGNOSIS — D649 Anemia, unspecified: Secondary | ICD-10-CM | POA: Diagnosis not present

## 2022-02-27 DIAGNOSIS — R296 Repeated falls: Secondary | ICD-10-CM | POA: Diagnosis not present

## 2022-02-27 DIAGNOSIS — Z9181 History of falling: Secondary | ICD-10-CM | POA: Diagnosis not present

## 2022-02-27 DIAGNOSIS — L03115 Cellulitis of right lower limb: Secondary | ICD-10-CM

## 2022-02-27 DIAGNOSIS — R2681 Unsteadiness on feet: Secondary | ICD-10-CM | POA: Diagnosis not present

## 2022-02-27 DIAGNOSIS — M6281 Muscle weakness (generalized): Secondary | ICD-10-CM | POA: Diagnosis not present

## 2022-02-27 DIAGNOSIS — R41841 Cognitive communication deficit: Secondary | ICD-10-CM | POA: Diagnosis not present

## 2022-02-27 DIAGNOSIS — M810 Age-related osteoporosis without current pathological fracture: Secondary | ICD-10-CM | POA: Diagnosis not present

## 2022-02-27 NOTE — Progress Notes (Signed)
Location:  [facility] Room Number: [place] of Service:  ALF [phone]) Provider: [name], NP  Patient Care Team: [name], MD as PCP - General (Internal Medicine) Martinique, Peter M, MD as PCP - Cardiology (Cardiology)  Extended Emergency Contact Information Primary Emergency Contact: [name],Patricia Address: [phone] [place]          [place] [address] [name] of [place] Phone: [phone] Mobile Phone: [phone] Relation: Sister Secondary Emergency Contact: [name],[name]  [name] of [place] Phone: [phone] Mobile Phone: [phone] Relation: Niece  Code Status:  DNR Goals of care: Advanced Directive information Advanced Directives 02/27/2022  Does Patient Have a Medical Advance Directive? Yes  Type of Paramedic of [place];Living will  Does patient want to make changes to medical advance directive? No - Patient declined  Copy of [place] in Chart? Yes - validated most recent copy scanned in chart (See row information)  Would patient like information on creating a medical advance directive? -     Chief Complaint  Patient presents with   Acute Visit    cellulitis    HPI:  Pt is a 88 y.o. female seen today for an acute visit for cellulitis.   03/03 she noticed her right lower leg was painful and warm to touch. Nickel sized skin tear noted to right anterior shin. She was started on doxycycline 100 mg po bid x 7 days. Today, nursing reports redness has improved. Patient still complaining of pain, rated 3/10.   Anemia- 02/17-02/18 she presented to the ED for hgb 6.1. Hemoccult was positive. She was given 2 units PRBC. She refused GI consult at the time. Abrasion to right lower leg noted on patient's return from ED. No recent falls or dizziness. She denies black tarry stools. She continues to ambulate with a walker. Referral has been made to see Dr. Fuller Plan with GI. Recent hgb  9.1 02/16/2022.   Dementia- 04/2021 CT head noted chronic vascular changes, MMSE 16/30, argumentative with staff during patient care, remains on Zyprexa and Sertaline     Past Medical History:  Diagnosis Date   Anxiety and depression    Aortic stenosis    a. 03/2014: Mild aortic stenosis by valve area and mean gradient though appeared more visually consistent with moderate AS.    CAD (coronary artery disease)    a. s/p prior PCI of D2 in 1992 b. stent to RCA in 2005   Diverticula of colon    GERD (gastroesophageal reflux disease)    HTN (hypertension)    Hypercholesteremia    Lateral myocardial infarction ([place]) 1992   Osteoarthritis    Osteopenia    Scoliosis    Past Surgical History:  Procedure Laterality Date   ABDOMINAL HYSTERECTOMY     APPENDECTOMY     CORONARY ANGIOPLASTY     TONSILLECTOMY      Allergies  Allergen Reactions   Latex Other (See Comments)    Unknown reaction - listed on [facility] 02/10/22   Nifedipine Other (See Comments)    Dark Urine; can only take orange tablet, allergic to yellow-brown tablet   Tetanus Toxoids Other (See Comments)    Unknown reaction   Tetanus-Diphtheria Toxoids Td Other (See Comments)    Unknown reaction   Adhesive [Tape] Rash    Outpatient Encounter Medications as of 02/27/2022  Medication Sig   acetaminophen (TYLENOL) 500 MG tablet Take 1,000 mg by mouth 2 (two) times daily.   aspirin  81 MG chewable tablet Chew 1 tablet (81 mg total) by mouth at bedtime.   atorvastatin (LIPITOR) 40 MG tablet Take 40 mg by mouth at bedtime.   cetirizine (ZYRTEC) 10 MG tablet Take 10 mg by mouth every morning.   cholecalciferol (VITAMIN D) 25 MCG (1000 UNIT) tablet Take 1,000 Units by mouth every evening.   DOXYCYCLINE HYCLATE PO Take 100 mg by mouth in the morning and at bedtime.   ferrous sulfate 325 (65 FE) MG tablet Take 1 tablet (325 mg total) by mouth daily with breakfast.   folic acid (FOLVITE) 1 MG tablet Take 1 tablet (1 mg total) by mouth  daily.   hydrochlorothiazide (HYDRODIURIL) 25 MG tablet Take 25 mg by mouth in the morning.   losartan (COZAAR) 100 MG tablet Take 100 mg by mouth at bedtime.   metoprolol succinate (TOPROL-XL) 50 MG 24 hr tablet Take 50 mg by mouth every evening. Take with or immediately following a meal.   mupirocin ointment (BACTROBAN) 2 % 1 application. 2 (two) times daily.   nitroGLYCERIN (NITROSTAT) 0.4 MG SL tablet Place 1 tablet (0.4 mg total) under the tongue every 5 (five) minutes as needed for chest pain.   OLANZapine (ZYPREXA) 2.5 MG tablet Take 2.5 mg by mouth at bedtime.   pantoprazole (PROTONIX) 40 MG tablet Take 1 tablet (40 mg total) by mouth daily.   senna (SENOKOT) 8.6 MG TABS tablet Take 1 tablet by mouth in the morning.   sertraline (ZOLOFT) 25 MG tablet Take 25 mg by mouth at bedtime.   [DISCONTINUED] amLODipine (NORVASC) 5 MG tablet Take 5 mg by mouth at bedtime. (Patient not taking: Reported on 02/27/2022)   No facility-administered encounter medications on file as of 02/27/2022.    Review of Systems  Constitutional:  Negative for activity change, appetite change, chills, fatigue and fever.  HENT:  Negative for congestion and trouble swallowing.   Eyes:  Negative for visual disturbance.  Respiratory:  Negative for cough, shortness of breath and wheezing.   Cardiovascular:  Negative for chest pain and leg swelling.  Gastrointestinal:  Negative for abdominal distention, abdominal pain, constipation, diarrhea and nausea.  Genitourinary:  Negative for dysuria, frequency and hematuria.  Musculoskeletal:  Positive for gait problem.  Skin:  Positive for wound.  Neurological:  Positive for weakness. Negative for dizziness and headaches.  Psychiatric/Behavioral:  Positive for confusion. Negative for dysphoric mood. The patient is not nervous/anxious.    Immunization History  Administered Date(s) Administered   Influenza Split 05/13/2009, 05/18/2010, 09/29/2011, 09/05/2012, 09/03/2013    Influenza, High Dose Seasonal PF 09/09/2015, 08/30/2017   Influenza, Quadrivalent, Recombinant, Inj, Pf 08/19/2018, 09/29/2019   Influenza-Unspecified 10/18/2021   Pneumococcal Conjugate-13 07/08/2013   Pneumococcal Polysaccharide-23 06/29/2006, 05/13/2009, 05/18/2010, 05/25/2011   Td 05/13/2009, 05/18/2010, 05/25/2011   Zoster Recombinat (Shingrix) 08/24/2017, 10/31/2017   Zoster, Live 08/22/2008, 08/24/2008, 05/13/2009, 05/18/2010, 05/25/2011   Pertinent  Health Maintenance Due  Topic Date Due   INFLUENZA VACCINE  Completed   DEXA SCAN  Completed   Fall Risk 04/23/2021 05/02/2021 12/14/2021 02/10/2022 02/11/2022  Patient Fall Risk Level Low fall risk High fall risk Low fall risk Moderate fall risk Moderate fall risk   Functional Status Survey:    Vitals:   02/27/22 1127  BP: 122/79  Pulse: 64  Resp: 20  Temp: (!) 97.2 F (36.2 C)  SpO2: 92%  Weight: 122 lb 3.2 oz (55.4 kg)  Height: '5\' 5"'$  (1.651 m)   Body mass index is 20.34 kg/m. Physical Exam  Vitals reviewed.  Constitutional:      General: She is not in acute distress. HENT:     Head: Normocephalic.  Eyes:     General:        Right eye: No discharge.        Left eye: No discharge.  Neck:     Vascular: No carotid bruit.  Cardiovascular:     Rate and Rhythm: Normal rate and regular rhythm.     Pulses: Normal pulses.     Heart sounds: Normal heart sounds. No murmur heard. Pulmonary:     Effort: Pulmonary effort is normal. No respiratory distress.     Breath sounds: Normal breath sounds. No wheezing.  Abdominal:     General: Bowel sounds are normal. There is no distension.     Palpations: Abdomen is soft.     Tenderness: There is no abdominal tenderness.  Musculoskeletal:     Cervical back: Neck supple.     Right lower leg: No edema.     Left lower leg: No edema.  Lymphadenopathy:     Cervical: No cervical adenopathy.  Skin:    General: Skin is warm and dry.     Findings: Lesion present.     Comments: Nickel  sized skin tear to right anterior shin, tender to touch, erythema and mild swelling surrounding site, serosanguinous drainage present.   Neurological:     General: No focal deficit present.     Mental Status: She is alert. Mental status is at baseline.     Motor: Weakness present.     Gait: Gait abnormal.     Comments: Walker  Psychiatric:        Mood and Affect: Mood normal.        Behavior: Behavior normal.        Cognition and Memory: Memory is impaired.     Comments: Very pleasant, follows commands, alert to self and familiar faces    Labs reviewed: Recent Labs    05/02/21 1032 11/24/21 0000 02/10/22 1303 02/11/22 0108  NA 140 141 137 138  K 3.2* 4.4 3.4* 3.0*  CL 104 106 104 106  CO2 27 27* 24 24  GLUCOSE 93  --  98 110*  BUN '15 12 16 11  '$ CREATININE 0.59 0.4* 0.74 0.64  CALCIUM 8.9 8.3* 8.5* 8.3*   Recent Labs    05/02/21 1032 11/24/21 0000 02/10/22 1303  AST '30 16 28  '$ ALT '18 12 25  '$ ALKPHOS 101 106 97  BILITOT 0.7  --  0.5  PROT 7.6  --  6.3*  ALBUMIN 4.0 3.2* 3.2*   Recent Labs    05/02/21 1032 11/24/21 0000 02/10/22 1303 02/11/22 0108  WBC 7.2 4.9 4.6 5.8  NEUTROABS 5.6  --  2.8 4.1  HGB 13.9 12.3 6.1* 8.0*  HCT 44.9 38 21.8* 26.6*  MCV 92.2  --  82.0 80.4  PLT 235 366 253 216   Lab Results  Component Value Date   TSH 2.90 11/24/2021   No results found for: HGBA1C Lab Results  Component Value Date   CHOL 86 01/23/2022   HDL 43 01/23/2022   LDLCALC 27 01/23/2022   TRIG 77 01/23/2022   CHOLHDL 1.9 09/17/2020    Significant Diagnostic Results in last 30 days:  No results found.  Assessment/Plan 1. Cellulitis of right lower extremity - nickel sized lesion with erythema, swelling and tenderness, afebrile - doxycycline 100 mg po bid x 7 days - started 03/03 - will add  mupirocin 2% ointment- apply to right shin bid x 7 days  2. Symptomatic anemia - ED visit 02/17-02/18- hgb 6.1- given 2 units PRBC/ + hemoccult - hgb 9.1 02/16/2022 -  asymptomatic  - GI referral made - cont ferrous sulfate daily - cont Protonix  3. Vascular dementia with behavior disturbance - argumentative with staff during ADLs - ambulating with walker - cont Zyprexa and Zoloft    Family/ staff Communication: plan discussed with patient and nurse  Labs/tests ordered:  none

## 2022-02-28 DIAGNOSIS — R2681 Unsteadiness on feet: Secondary | ICD-10-CM | POA: Diagnosis not present

## 2022-02-28 DIAGNOSIS — R41841 Cognitive communication deficit: Secondary | ICD-10-CM | POA: Diagnosis not present

## 2022-02-28 DIAGNOSIS — M810 Age-related osteoporosis without current pathological fracture: Secondary | ICD-10-CM | POA: Diagnosis not present

## 2022-02-28 DIAGNOSIS — M6281 Muscle weakness (generalized): Secondary | ICD-10-CM | POA: Diagnosis not present

## 2022-02-28 DIAGNOSIS — R296 Repeated falls: Secondary | ICD-10-CM | POA: Diagnosis not present

## 2022-02-28 DIAGNOSIS — Z9181 History of falling: Secondary | ICD-10-CM | POA: Diagnosis not present

## 2022-03-01 DIAGNOSIS — R296 Repeated falls: Secondary | ICD-10-CM | POA: Diagnosis not present

## 2022-03-01 DIAGNOSIS — M6281 Muscle weakness (generalized): Secondary | ICD-10-CM | POA: Diagnosis not present

## 2022-03-01 DIAGNOSIS — R41841 Cognitive communication deficit: Secondary | ICD-10-CM | POA: Diagnosis not present

## 2022-03-01 DIAGNOSIS — M810 Age-related osteoporosis without current pathological fracture: Secondary | ICD-10-CM | POA: Diagnosis not present

## 2022-03-01 DIAGNOSIS — R2681 Unsteadiness on feet: Secondary | ICD-10-CM | POA: Diagnosis not present

## 2022-03-01 DIAGNOSIS — Z9181 History of falling: Secondary | ICD-10-CM | POA: Diagnosis not present

## 2022-03-02 DIAGNOSIS — R296 Repeated falls: Secondary | ICD-10-CM | POA: Diagnosis not present

## 2022-03-02 DIAGNOSIS — R2681 Unsteadiness on feet: Secondary | ICD-10-CM | POA: Diagnosis not present

## 2022-03-02 DIAGNOSIS — M6281 Muscle weakness (generalized): Secondary | ICD-10-CM | POA: Diagnosis not present

## 2022-03-02 DIAGNOSIS — M810 Age-related osteoporosis without current pathological fracture: Secondary | ICD-10-CM | POA: Diagnosis not present

## 2022-03-02 DIAGNOSIS — Z9181 History of falling: Secondary | ICD-10-CM | POA: Diagnosis not present

## 2022-03-02 DIAGNOSIS — R41841 Cognitive communication deficit: Secondary | ICD-10-CM | POA: Diagnosis not present

## 2022-03-03 DIAGNOSIS — Z9181 History of falling: Secondary | ICD-10-CM | POA: Diagnosis not present

## 2022-03-03 DIAGNOSIS — M6281 Muscle weakness (generalized): Secondary | ICD-10-CM | POA: Diagnosis not present

## 2022-03-03 DIAGNOSIS — R296 Repeated falls: Secondary | ICD-10-CM | POA: Diagnosis not present

## 2022-03-03 DIAGNOSIS — M810 Age-related osteoporosis without current pathological fracture: Secondary | ICD-10-CM | POA: Diagnosis not present

## 2022-03-03 DIAGNOSIS — R41841 Cognitive communication deficit: Secondary | ICD-10-CM | POA: Diagnosis not present

## 2022-03-03 DIAGNOSIS — R2681 Unsteadiness on feet: Secondary | ICD-10-CM | POA: Diagnosis not present

## 2022-03-06 ENCOUNTER — Non-Acute Institutional Stay: Payer: Medicare Other | Admitting: Orthopedic Surgery

## 2022-03-06 ENCOUNTER — Encounter: Payer: Self-pay | Admitting: Orthopedic Surgery

## 2022-03-06 DIAGNOSIS — I2583 Coronary atherosclerosis due to lipid rich plaque: Secondary | ICD-10-CM

## 2022-03-06 DIAGNOSIS — F32A Depression, unspecified: Secondary | ICD-10-CM | POA: Diagnosis not present

## 2022-03-06 DIAGNOSIS — I5189 Other ill-defined heart diseases: Secondary | ICD-10-CM

## 2022-03-06 DIAGNOSIS — M6281 Muscle weakness (generalized): Secondary | ICD-10-CM | POA: Diagnosis not present

## 2022-03-06 DIAGNOSIS — D649 Anemia, unspecified: Secondary | ICD-10-CM | POA: Diagnosis not present

## 2022-03-06 DIAGNOSIS — I2510 Atherosclerotic heart disease of native coronary artery without angina pectoris: Secondary | ICD-10-CM

## 2022-03-06 DIAGNOSIS — E7800 Pure hypercholesterolemia, unspecified: Secondary | ICD-10-CM

## 2022-03-06 DIAGNOSIS — R41841 Cognitive communication deficit: Secondary | ICD-10-CM | POA: Diagnosis not present

## 2022-03-06 DIAGNOSIS — F01518 Vascular dementia, unspecified severity, with other behavioral disturbance: Secondary | ICD-10-CM

## 2022-03-06 DIAGNOSIS — L03115 Cellulitis of right lower limb: Secondary | ICD-10-CM | POA: Diagnosis not present

## 2022-03-06 DIAGNOSIS — I10 Essential (primary) hypertension: Secondary | ICD-10-CM

## 2022-03-06 DIAGNOSIS — Z9181 History of falling: Secondary | ICD-10-CM | POA: Diagnosis not present

## 2022-03-06 DIAGNOSIS — M810 Age-related osteoporosis without current pathological fracture: Secondary | ICD-10-CM | POA: Diagnosis not present

## 2022-03-06 DIAGNOSIS — R296 Repeated falls: Secondary | ICD-10-CM | POA: Diagnosis not present

## 2022-03-06 DIAGNOSIS — F419 Anxiety disorder, unspecified: Secondary | ICD-10-CM

## 2022-03-06 DIAGNOSIS — I480 Paroxysmal atrial fibrillation: Secondary | ICD-10-CM | POA: Diagnosis not present

## 2022-03-06 DIAGNOSIS — R2681 Unsteadiness on feet: Secondary | ICD-10-CM | POA: Diagnosis not present

## 2022-03-06 NOTE — Progress Notes (Unsigned)
Location:  [place] Room Number: [place] of Service:  ALF [phone]) Provider: [name], NP  Patient Care Team: [name], MD as PCP - General (Internal Medicine) Martinique, Peter M, MD as PCP - Cardiology (Cardiology)  Extended Emergency Contact Information Primary Emergency Contact: [name],Patricia Address: [phone] [place]          [place] [address] [name] of [place] Phone: [phone] Mobile Phone: [phone] Relation: Sister Secondary Emergency Contact: [name],[name]  [name] of [place] Phone: [phone] Mobile Phone: [phone] Relation: Niece  Code Status:  DNR Goals of care: Advanced Directive information Advanced Directives 03/06/2022  Does Patient Have a Medical Advance Directive? Yes  Type of Paramedic of [place];Living will;Out of facility DNR (pink MOST or yellow form)  Does patient want to make changes to medical advance directive? No - Patient declined  Copy of [place] in Chart? Yes - validated most recent copy scanned in chart (See row information)  Would patient like information on creating a medical advance directive? -     Chief Complaint  Patient presents with   Medical Management of Chronic Issues    Routine visit. Discuss the need for TDAP vaccine, or post pone if patient refuses.    HPI:  Pt is a 88 y.o. female seen today for medical management of chronic diseases.     Past Medical History:  Diagnosis Date   Anxiety and depression    Aortic stenosis    a. 03/2014: Mild aortic stenosis by valve area and mean gradient though appeared more visually consistent with moderate AS.    CAD (coronary artery disease)    a. s/p prior PCI of D2 in 1992 b. stent to RCA in 2005   Diverticula of colon    GERD (gastroesophageal reflux disease)    HTN (hypertension)    Hypercholesteremia    Lateral myocardial infarction ([place]) 1992   Osteoarthritis     Osteopenia    Scoliosis    Past Surgical History:  Procedure Laterality Date   ABDOMINAL HYSTERECTOMY     APPENDECTOMY     CORONARY ANGIOPLASTY     TONSILLECTOMY      Allergies  Allergen Reactions   Latex Other (See Comments)    Unknown reaction - listed on [facility] 02/10/22   Nifedipine Other (See Comments)    Dark Urine; can only take orange tablet, allergic to yellow-brown tablet   Tetanus Toxoids Other (See Comments)    Unknown reaction   Tetanus-Diphtheria Toxoids Td Other (See Comments)    Unknown reaction   Adhesive [Tape] Rash    Outpatient Encounter Medications as of 03/06/2022  Medication Sig   acetaminophen (TYLENOL) 500 MG tablet Take 1,000 mg by mouth 2 (two) times daily.   amLODipine (NORVASC) 5 MG tablet Take 5 mg by mouth daily.   aspirin 81 MG chewable tablet Chew 1 tablet (81 mg total) by mouth at bedtime.   atorvastatin (LIPITOR) 40 MG tablet Take 40 mg by mouth at bedtime.   cetirizine (ZYRTEC) 10 MG tablet Take 10 mg by mouth every morning.   cholecalciferol (VITAMIN D) 25 MCG (1000 UNIT) tablet Take 1,000 Units by mouth every evening.   ferrous sulfate 325 (65 FE) MG tablet Take 1 tablet (325 mg total) by mouth daily with breakfast.   folic acid (FOLVITE) 1 MG tablet Take 1 tablet (1 mg total) by mouth daily.   hydrochlorothiazide (HYDRODIURIL) 25 MG tablet Take 25  mg by mouth in the morning.   losartan (COZAAR) 100 MG tablet Take 100 mg by mouth at bedtime.   metoprolol succinate (TOPROL-XL) 50 MG 24 hr tablet Take 50 mg by mouth every evening. Take with or immediately following a meal.   mupirocin ointment (BACTROBAN) 2 % 1 application. 2 (two) times daily.   nitroGLYCERIN (NITROSTAT) 0.4 MG SL tablet Place 1 tablet (0.4 mg total) under the tongue every 5 (five) minutes as needed for chest pain.   OLANZapine (ZYPREXA) 2.5 MG tablet Take 2.5 mg by mouth at bedtime.   pantoprazole (PROTONIX) 40 MG tablet Take 1 tablet (40 mg total) by mouth daily.   senna  (SENOKOT) 8.6 MG TABS tablet Take 1 tablet by mouth in the morning.   sertraline (ZOLOFT) 25 MG tablet Take 25 mg by mouth at bedtime.   [DISCONTINUED] DOXYCYCLINE HYCLATE PO Take 100 mg by mouth in the morning and at bedtime. (Patient not taking: Reported on 03/06/2022)   No facility-administered encounter medications on file as of 03/06/2022.    Review of Systems  Immunization History  Administered Date(s) Administered   Influenza Split 05/13/2009, 05/18/2010, 09/29/2011, 09/05/2012, 09/03/2013   Influenza, High Dose Seasonal PF 09/09/2015, 08/30/2017   Influenza, Quadrivalent, Recombinant, Inj, Pf 08/19/2018, 09/29/2019   Influenza-Unspecified 10/18/2021   Moderna Covid-19 Vaccine Bivalent Booster 87yr & up 09/14/2021   Moderna SARS-COV2 Booster Vaccination 06/01/2021   Moderna Sars-Covid-2 Vaccination 12/29/2019, 01/26/2020   Pneumococcal Conjugate-13 07/08/2013   Pneumococcal Polysaccharide-23 06/29/2006, 05/13/2009, 05/18/2010, 05/25/2011   Td 05/13/2009, 05/18/2010, 05/25/2011   Zoster Recombinat (Shingrix) 08/24/2017, 10/31/2017   Zoster, Live 08/22/2008, 08/24/2008, 05/13/2009, 05/18/2010, 05/25/2011   Pertinent  Health Maintenance Due  Topic Date Due   INFLUENZA VACCINE  Completed   DEXA SCAN  Completed   Fall Risk 04/23/2021 05/02/2021 12/14/2021 02/10/2022 02/11/2022  Patient Fall Risk Level Low fall risk High fall risk Low fall risk Moderate fall risk Moderate fall risk   Functional Status Survey:    Vitals:   03/06/22 1134  BP: 132/69  Pulse: 63  Resp: 20  Temp: (!) 97 F (36.1 C)  SpO2: 93%  Weight: 122 lb 3.2 oz (55.4 kg)  Height: '5\' 5"'$  (1.651 m)   Body mass index is 20.34 kg/m. Physical Exam  Labs reviewed: Recent Labs    05/02/21 1032 11/24/21 0000 02/10/22 1303 02/11/22 0108  NA 140 141 137 138  K 3.2* 4.4 3.4* 3.0*  CL 104 106 104 106  CO2 27 27* 24 24  GLUCOSE 93  --  98 110*  BUN '15 12 16 11  '$ CREATININE 0.59 0.4* 0.74 0.64  CALCIUM 8.9  8.3* 8.5* 8.3*   Recent Labs    05/02/21 1032 11/24/21 0000 02/10/22 1303  AST '30 16 28  '$ ALT '18 12 25  '$ ALKPHOS 101 106 97  BILITOT 0.7  --  0.5  PROT 7.6  --  6.3*  ALBUMIN 4.0 3.2* 3.2*   Recent Labs    05/02/21 1032 11/24/21 0000 02/10/22 1303 02/11/22 0108 02/20/22 0000  WBC 7.2   < > 4.6 5.8 3.9  NEUTROABS 5.6  --  2.8 4.1 2,071.00  HGB 13.9   < > 6.1* 8.0* 9.1*  HCT 44.9   < > 21.8* 26.6* 31*  MCV 92.2  --  82.0 80.4  --   PLT 235   < > 253 216 291   < > = values in this interval not displayed.   Lab Results  Component Value Date  TSH 2.90 11/24/2021   No results found for: HGBA1C Lab Results  Component Value Date   CHOL 86 01/23/2022   HDL 43 01/23/2022   LDLCALC 27 01/23/2022   TRIG 77 01/23/2022   CHOLHDL 1.9 09/17/2020    Significant Diagnostic Results in last 30 days:  No results found.  Assessment/Plan There are no diagnoses linked to this encounter.   Family/ staff Communication: ***  Labs/tests ordered:  ***

## 2022-03-07 DIAGNOSIS — Z9181 History of falling: Secondary | ICD-10-CM | POA: Diagnosis not present

## 2022-03-07 DIAGNOSIS — M810 Age-related osteoporosis without current pathological fracture: Secondary | ICD-10-CM | POA: Diagnosis not present

## 2022-03-07 DIAGNOSIS — R2681 Unsteadiness on feet: Secondary | ICD-10-CM | POA: Diagnosis not present

## 2022-03-07 DIAGNOSIS — R41841 Cognitive communication deficit: Secondary | ICD-10-CM | POA: Diagnosis not present

## 2022-03-07 DIAGNOSIS — M6281 Muscle weakness (generalized): Secondary | ICD-10-CM | POA: Diagnosis not present

## 2022-03-07 DIAGNOSIS — R296 Repeated falls: Secondary | ICD-10-CM | POA: Diagnosis not present

## 2022-03-08 DIAGNOSIS — Z9181 History of falling: Secondary | ICD-10-CM | POA: Diagnosis not present

## 2022-03-08 DIAGNOSIS — R41841 Cognitive communication deficit: Secondary | ICD-10-CM | POA: Diagnosis not present

## 2022-03-08 DIAGNOSIS — M6281 Muscle weakness (generalized): Secondary | ICD-10-CM | POA: Diagnosis not present

## 2022-03-08 DIAGNOSIS — R2681 Unsteadiness on feet: Secondary | ICD-10-CM | POA: Diagnosis not present

## 2022-03-08 DIAGNOSIS — R296 Repeated falls: Secondary | ICD-10-CM | POA: Diagnosis not present

## 2022-03-08 DIAGNOSIS — M810 Age-related osteoporosis without current pathological fracture: Secondary | ICD-10-CM | POA: Diagnosis not present

## 2022-03-09 DIAGNOSIS — I10 Essential (primary) hypertension: Secondary | ICD-10-CM | POA: Diagnosis not present

## 2022-03-09 DIAGNOSIS — R41841 Cognitive communication deficit: Secondary | ICD-10-CM | POA: Diagnosis not present

## 2022-03-09 DIAGNOSIS — M6281 Muscle weakness (generalized): Secondary | ICD-10-CM | POA: Diagnosis not present

## 2022-03-09 DIAGNOSIS — Z9181 History of falling: Secondary | ICD-10-CM | POA: Diagnosis not present

## 2022-03-09 DIAGNOSIS — D649 Anemia, unspecified: Secondary | ICD-10-CM | POA: Diagnosis not present

## 2022-03-09 DIAGNOSIS — R2681 Unsteadiness on feet: Secondary | ICD-10-CM | POA: Diagnosis not present

## 2022-03-09 DIAGNOSIS — R296 Repeated falls: Secondary | ICD-10-CM | POA: Diagnosis not present

## 2022-03-09 DIAGNOSIS — M810 Age-related osteoporosis without current pathological fracture: Secondary | ICD-10-CM | POA: Diagnosis not present

## 2022-03-09 LAB — CBC AND DIFFERENTIAL
HCT: 36 (ref 36–46)
Hemoglobin: 10.7 — AB (ref 12.0–16.0)
Neutrophils Absolute: 1550
Platelets: 266 10*3/uL (ref 150–400)
WBC: 3.7

## 2022-03-09 LAB — CBC: RBC: 4.32 (ref 3.87–5.11)

## 2022-03-10 DIAGNOSIS — R2681 Unsteadiness on feet: Secondary | ICD-10-CM | POA: Diagnosis not present

## 2022-03-10 DIAGNOSIS — R41841 Cognitive communication deficit: Secondary | ICD-10-CM | POA: Diagnosis not present

## 2022-03-10 DIAGNOSIS — M810 Age-related osteoporosis without current pathological fracture: Secondary | ICD-10-CM | POA: Diagnosis not present

## 2022-03-10 DIAGNOSIS — R296 Repeated falls: Secondary | ICD-10-CM | POA: Diagnosis not present

## 2022-03-10 DIAGNOSIS — M6281 Muscle weakness (generalized): Secondary | ICD-10-CM | POA: Diagnosis not present

## 2022-03-10 DIAGNOSIS — Z9181 History of falling: Secondary | ICD-10-CM | POA: Diagnosis not present

## 2022-03-13 DIAGNOSIS — M810 Age-related osteoporosis without current pathological fracture: Secondary | ICD-10-CM | POA: Diagnosis not present

## 2022-03-13 DIAGNOSIS — R41841 Cognitive communication deficit: Secondary | ICD-10-CM | POA: Diagnosis not present

## 2022-03-13 DIAGNOSIS — Z9181 History of falling: Secondary | ICD-10-CM | POA: Diagnosis not present

## 2022-03-13 DIAGNOSIS — R296 Repeated falls: Secondary | ICD-10-CM | POA: Diagnosis not present

## 2022-03-13 DIAGNOSIS — R2681 Unsteadiness on feet: Secondary | ICD-10-CM | POA: Diagnosis not present

## 2022-03-13 DIAGNOSIS — M6281 Muscle weakness (generalized): Secondary | ICD-10-CM | POA: Diagnosis not present

## 2022-03-14 DIAGNOSIS — R41841 Cognitive communication deficit: Secondary | ICD-10-CM | POA: Diagnosis not present

## 2022-03-14 DIAGNOSIS — R296 Repeated falls: Secondary | ICD-10-CM | POA: Diagnosis not present

## 2022-03-14 DIAGNOSIS — R2681 Unsteadiness on feet: Secondary | ICD-10-CM | POA: Diagnosis not present

## 2022-03-14 DIAGNOSIS — Z9181 History of falling: Secondary | ICD-10-CM | POA: Diagnosis not present

## 2022-03-14 DIAGNOSIS — M6281 Muscle weakness (generalized): Secondary | ICD-10-CM | POA: Diagnosis not present

## 2022-03-14 DIAGNOSIS — M810 Age-related osteoporosis without current pathological fracture: Secondary | ICD-10-CM | POA: Diagnosis not present

## 2022-03-15 DIAGNOSIS — M810 Age-related osteoporosis without current pathological fracture: Secondary | ICD-10-CM | POA: Diagnosis not present

## 2022-03-15 DIAGNOSIS — M6281 Muscle weakness (generalized): Secondary | ICD-10-CM | POA: Diagnosis not present

## 2022-03-15 DIAGNOSIS — R41841 Cognitive communication deficit: Secondary | ICD-10-CM | POA: Diagnosis not present

## 2022-03-15 DIAGNOSIS — Z9181 History of falling: Secondary | ICD-10-CM | POA: Diagnosis not present

## 2022-03-15 DIAGNOSIS — R2681 Unsteadiness on feet: Secondary | ICD-10-CM | POA: Diagnosis not present

## 2022-03-15 DIAGNOSIS — R296 Repeated falls: Secondary | ICD-10-CM | POA: Diagnosis not present

## 2022-03-16 ENCOUNTER — Non-Acute Institutional Stay: Payer: Medicare Other | Admitting: Internal Medicine

## 2022-03-16 ENCOUNTER — Encounter: Payer: Self-pay | Admitting: Internal Medicine

## 2022-03-16 DIAGNOSIS — D649 Anemia, unspecified: Secondary | ICD-10-CM

## 2022-03-16 DIAGNOSIS — R41841 Cognitive communication deficit: Secondary | ICD-10-CM | POA: Diagnosis not present

## 2022-03-16 DIAGNOSIS — I480 Paroxysmal atrial fibrillation: Secondary | ICD-10-CM

## 2022-03-16 DIAGNOSIS — R2681 Unsteadiness on feet: Secondary | ICD-10-CM | POA: Diagnosis not present

## 2022-03-16 DIAGNOSIS — F01518 Vascular dementia, unspecified severity, with other behavioral disturbance: Secondary | ICD-10-CM

## 2022-03-16 DIAGNOSIS — S81811S Laceration without foreign body, right lower leg, sequela: Secondary | ICD-10-CM

## 2022-03-16 DIAGNOSIS — Z9181 History of falling: Secondary | ICD-10-CM | POA: Diagnosis not present

## 2022-03-16 DIAGNOSIS — I10 Essential (primary) hypertension: Secondary | ICD-10-CM | POA: Diagnosis not present

## 2022-03-16 DIAGNOSIS — R296 Repeated falls: Secondary | ICD-10-CM | POA: Diagnosis not present

## 2022-03-16 DIAGNOSIS — M6281 Muscle weakness (generalized): Secondary | ICD-10-CM | POA: Diagnosis not present

## 2022-03-16 DIAGNOSIS — M810 Age-related osteoporosis without current pathological fracture: Secondary | ICD-10-CM | POA: Diagnosis not present

## 2022-03-16 NOTE — Progress Notes (Signed)
?Location:   [facility] Room Number: 18 ?Place of Service:  ALF (13) ?Provider:  Veleta Miners MD ? ?Erin Dad, MD ? ?Patient Care Team: ?Erin Dad, MD as PCP - General (Internal Medicine) ?Martinique, Peter M, MD as PCP - Cardiology (Cardiology) ? ?Extended Emergency Contact Information ?Primary Emergency Contact: [name],Patricia ?Address: [address] ?         [place] [address] [place] ?Home Phone: [phone] ?Mobile Phone: [phone] ?Relation: Sister ?Secondary Emergency Contact: [name],[name] ? [place] ?Home Phone: [phone] ?Mobile Phone: [phone] ?Relation: Niece ? ?Code Status:  DNR ?Goals of care: Advanced Directive information ? ?  03/16/2022  ?  2:38 PM  ?Advanced Directives  ?Does Patient Have a Medical Advance Directive? Yes  ?Type of Paramedic of [place];Living will;Out of facility DNR (pink MOST or yellow form)  ?Does patient want to make changes to medical advance directive? No - Patient declined  ?Copy of [place] in Chart? Yes - validated most recent copy scanned in chart (See row information)  ? ? ? ?Chief Complaint  ?Patient presents with  ? Acute Visit  ? ? ?HPI:  ?Pt is a 88 y.o. female seen today for an acute visit for wound due to infected  skin tear ? ?Patient has h/o CAD s/p Angioplasty and Stenting in RCA ?HTN, HLD ?Moderate Aortic stenosis,  ?H/o PAF Underwent DCCV No Anticoagulation due to her h/o Falls and Acute Anemia ?Chronic Back pain ?Unstable Gait and Falls ?Anemia Refused GI work up ?Dementia  ? ?Patient had infected skin tear and was treated with Antibiotics ?It has not healed yet and she is upset that no one has looked at it ?She still has some pain but no redness or discharge ? ? ?Past Medical History:  ?Diagnosis Date  ? Anxiety and depression   ? Aortic stenosis   ? a. 03/2014: Mild aortic stenosis by valve area and mean gradient though appeared more  visually consistent with moderate AS.   ? CAD (coronary artery disease)   ? a. s/p prior PCI of D2 in 1992 b. stent to RCA in 2005  ? Diverticula of colon   ? GERD (gastroesophageal reflux disease)   ? HTN (hypertension)   ? Hypercholesteremia   ? Lateral myocardial infarction [facility]) 1992  ? Osteoarthritis   ? Osteopenia   ? Scoliosis   ? ?Past Surgical History:  ?Procedure Laterality Date  ? ABDOMINAL HYSTERECTOMY    ? APPENDECTOMY    ? CORONARY ANGIOPLASTY    ? TONSILLECTOMY    ? ? ?Allergies  ?Allergen Reactions  ? Latex Other (See Comments)  ?  Unknown reaction - listed on [facility] 02/10/22  ? Nifedipine Other (See Comments)  ?  Dark Urine; can only take orange tablet, allergic to yellow-brown tablet  ? Tetanus Toxoids Other (See Comments)  ?  Unknown reaction  ? Tetanus-Diphtheria Toxoids Td Other (See Comments)  ?  Unknown reaction  ? Adhesive [Tape] Rash  ? ? ?Allergies as of 03/16/2022   ? ?   Reactions  ? Latex Other (See Comments)  ? Unknown reaction - listed on [facility] 02/10/22  ? Nifedipine Other (See Comments)  ? Dark Urine; can only take orange tablet, allergic to yellow-brown tablet  ? Tetanus Toxoids Other (See Comments)  ? Unknown reaction  ? Tetanus-diphtheria Toxoids Td Other (See Comments)  ? Unknown reaction  ? Adhesive [tape] Rash  ? ?  ? ?  ?  Medication List  ?  ? ?  ? Accurate as of March 16, 2022  2:39 PM. If you have any questions, ask your nurse or doctor.  ?  ?  ? ?  ? ?STOP taking these medications   ? ?mupirocin ointment 2 % ?Commonly known as: BACTROBAN ?Stopped by: Erin Dad, MD ?  ? ?  ? ?TAKE these medications   ? ?acetaminophen 500 MG tablet ?Commonly known as: TYLENOL ?Take 1,000 mg by mouth 2 (two) times daily. ?  ?amLODipine 5 MG tablet ?Commonly known as: NORVASC ?Take 5 mg by mouth daily. ?  ?aspirin 81 MG chewable tablet ?Chew 1 tablet (81 mg total) by mouth at bedtime. ?  ?atorvastatin 40 MG tablet ?Commonly known as: LIPITOR ?Take 40 mg by mouth at bedtime. ?  ?cetirizine 10 MG  tablet ?Commonly known as: ZYRTEC ?Take 10 mg by mouth every morning. ?  ?cholecalciferol 25 MCG (1000 UNIT) tablet ?Commonly known as: VITAMIN D ?Take 1,000 Units by mouth every evening. ?  ?ferrous sulfate 325 (65 FE) MG tablet ?Take 1 tablet (325 mg total) by mouth daily with breakfast. ?  ?folic acid 1 MG tablet ?Commonly known as: FOLVITE ?Take 1 tablet (1 mg total) by mouth daily. ?  ?hydrochlorothiazide 25 MG tablet ?Commonly known as: HYDRODIURIL ?Take 25 mg by mouth in the morning. ?  ?losartan 100 MG tablet ?Commonly known as: COZAAR ?Take 100 mg by mouth at bedtime. ?  ?metoprolol succinate 50 MG 24 hr tablet ?Commonly known as: TOPROL-XL ?Take 50 mg by mouth every evening. Take with or immediately following a meal. ?  ?nitroGLYCERIN 0.4 MG SL tablet ?Commonly known as: Nitrostat ?Place 1 tablet (0.4 mg total) under the tongue every 5 (five) minutes as needed for chest pain. ?  ?OLANZapine 2.5 MG tablet ?Commonly known as: ZYPREXA ?Take 2.5 mg by mouth at bedtime. ?  ?pantoprazole 40 MG tablet ?Commonly known as: PROTONIX ?Take 1 tablet (40 mg total) by mouth daily. ?  ?senna 8.6 MG Tabs tablet ?Commonly known as: SENOKOT ?Take 1 tablet by mouth in the morning. ?  ?sertraline 25 MG tablet ?Commonly known as: ZOLOFT ?Take 25 mg by mouth at bedtime. ?  ? ?  ? ? ?Review of Systems  ?Constitutional:  Negative for activity change and appetite change.  ?HENT: Negative.    ?Respiratory:  Negative for cough and shortness of breath.   ?Cardiovascular:  Negative for leg swelling.  ?Gastrointestinal:  Negative for constipation.  ?Genitourinary: Negative.   ?Musculoskeletal:  Negative for arthralgias, gait problem and myalgias.  ?Skin:  Positive for wound.  ?Neurological:  Negative for dizziness and weakness.  ?Psychiatric/Behavioral:  Positive for confusion. Negative for dysphoric mood and sleep disturbance.   ? ?Immunization History  ?Administered Date(s) Administered  ? Influenza Split 05/13/2009, 05/18/2010,  09/29/2011, 09/05/2012, 09/03/2013  ? Influenza, High Dose Seasonal PF 09/09/2015, 08/30/2017  ? Influenza, Quadrivalent, Recombinant, Inj, Pf 08/19/2018, 09/29/2019  ? Influenza-Unspecified 10/18/2021  ? Moderna Covid-19 Vaccine Bivalent Booster 4yr & up 09/14/2021  ? Moderna SARS-COV2 Booster Vaccination 06/01/2021  ? Moderna Sars-Covid-2 Vaccination 12/29/2019, 01/26/2020  ? Pneumococcal Conjugate-13 07/08/2013  ? Pneumococcal Polysaccharide-23 06/29/2006, 05/13/2009, 05/18/2010, 05/25/2011  ? Td 05/13/2009, 05/18/2010, 05/25/2011  ? Zoster Recombinat (Shingrix) 08/24/2017, 10/31/2017  ? Zoster, Live 08/22/2008, 08/24/2008, 05/13/2009, 05/18/2010, 05/25/2011  ? ?Pertinent  Health Maintenance Due  ?Topic Date Due  ? INFLUENZA VACCINE  Completed  ? DEXA SCAN  Completed  ? ? ?  04/23/2021  ?  4:01 PM 05/02/2021  ? 10:07 AM 12/14/2021  ? 12:17 PM 02/10/2022  ? 10:00 PM 02/11/2022  ?  8:00 AM  ?Fall Risk  ?Patient Fall Risk Level Low fall risk High fall risk Low fall risk Moderate fall risk Moderate fall risk  ? ?Functional Status Survey: ?  ? ?Vitals:  ? 03/16/22 1429  ?BP: 129/74  ?Pulse: 72  ?Resp: 16  ?Temp: (!) 97 ?F (36.1 ?C)  ?SpO2: 96%  ?Weight: 122 lb 3.2 oz (55.4 kg)  ?Height: '5\' 5"'$  (1.651 m)  ? ?Body mass index is 20.34 kg/m?[name] Kitchen ?Physical Exam ?Vitals reviewed.  ?Constitutional:   ?   Appearance: Normal appearance.  ?HENT:  ?   Head: Normocephalic.  ?   Nose: Nose normal.  ?   Mouth/Throat:  ?   Mouth: Mucous membranes are moist.  ?   Pharynx: Oropharynx is clear.  ?Eyes:  ?   Pupils: Pupils are equal, round, and reactive to light.  ?Cardiovascular:  ?   Rate and Rhythm: Normal rate and regular rhythm.  ?   Pulses: Normal pulses.  ?   Heart sounds: Murmur heard.  ?Pulmonary:  ?   Effort: Pulmonary effort is normal.  ?   Breath sounds: Normal breath sounds.  ?Abdominal:  ?   General: Abdomen is flat. Bowel sounds are normal.  ?   Palpations: Abdomen is soft.  ?Musculoskeletal:     ?   General: No swelling.  ?    Cervical back: Neck supple.  ?Skin: ?   General: Skin is warm.  ?   Comments: Healing skin tear in Right LE still has soft Eschar. No redness or discharge  ?Neurological:  ?   General: No focal deficit present

## 2022-03-20 ENCOUNTER — Non-Acute Institutional Stay: Payer: Medicare Other | Admitting: Orthopedic Surgery

## 2022-03-20 ENCOUNTER — Encounter: Payer: Self-pay | Admitting: Orthopedic Surgery

## 2022-03-20 DIAGNOSIS — R296 Repeated falls: Secondary | ICD-10-CM | POA: Diagnosis not present

## 2022-03-20 DIAGNOSIS — M810 Age-related osteoporosis without current pathological fracture: Secondary | ICD-10-CM | POA: Diagnosis not present

## 2022-03-20 DIAGNOSIS — Z9181 History of falling: Secondary | ICD-10-CM | POA: Diagnosis not present

## 2022-03-20 DIAGNOSIS — Z0000 Encounter for general adult medical examination without abnormal findings: Secondary | ICD-10-CM | POA: Diagnosis not present

## 2022-03-20 DIAGNOSIS — R41841 Cognitive communication deficit: Secondary | ICD-10-CM | POA: Diagnosis not present

## 2022-03-20 DIAGNOSIS — M6281 Muscle weakness (generalized): Secondary | ICD-10-CM | POA: Diagnosis not present

## 2022-03-20 DIAGNOSIS — R2681 Unsteadiness on feet: Secondary | ICD-10-CM | POA: Diagnosis not present

## 2022-03-20 NOTE — Patient Instructions (Signed)
?  [name] , ?Thank you for taking time to come for your Medicare Wellness Visit. I appreciate your ongoing commitment to your health goals. Please review the following plan we discussed and let me know if I can assist you in the future.  ? ?These are the goals we discussed: ? Goals   ? ?  Maintain Mobility and Function   ?  Evidence-based guidance:  ?Emphasize the importance of physical activity and aerobic exercise as included in treatment plan; assess barriers to adherence; consider patient's abilities and preferences.  ?Encourage gradual increase in activity or exercise instead of stopping if pain occurs.  ?Reinforce individual therapy exercise prescription, such as strengthening, stabilization and stretching programs.  ?Promote optimal body mechanics to stabilize the spine with lifting and functional activity.  ?Encourage activity and mobility modifications to facilitate optimal function, such as using a log roll for bed mobility or dressing from a seated position.  ?Reinforce individual adaptive equipment recommendations to limit excessive spinal movements, such as a Systems analyst.  ?Assess adequacy of sleep; encourage use of sleep hygiene techniques, such as bedtime routine; use of white noise; dark, cool bedroom; avoiding daytime naps, heavy meals or exercise before bedtime.  ?Promote positions and modification to optimize sleep and sexual activity; consider pillows or positioning devices to assist in maintaining neutral spine.  ?Explore options for applying ergonomic principles at work and home, such as frequent position changes, using ergonomically designed equipment and working at optimal height.  ?Promote modifications to increase comfort with driving such as lumbar support, optimizing seat and steering wheel position, using cruise control and taking frequent rest stops to stretch and walk.   ?Notes:  ?  ? ?  ?  ?This is a list of the screening recommended for you and due dates:  ?Health  Maintenance  ?Topic Date Due  ? Tetanus Vaccine  05/24/2021  ? Pneumonia Vaccine  Completed  ? Flu Shot  Completed  ? DEXA scan (bone density measurement)  Completed  ? COVID-19 Vaccine  Completed  ? Zoster (Shingles) Vaccine  Completed  ? HPV Vaccine  Aged Out  ? ?Orders for Tdap written. Discussed bone density, does not want to do anymore. Will confirm with HPOA.  ?

## 2022-03-20 NOTE — Progress Notes (Signed)
? ?Subjective:  ? [name] is a 88 y.o. female who presents for Medicare Annual (Subsequent) preventive examination. ? ?Review of Systems    ?Cardiac Risk Factors include: advanced age (>22mn, >>3women);hypertension ? ?   ?Objective:  ?  ?Today's Vitals  ? 03/20/22 0910 03/20/22 1011  ?BP: (!) 142/77   ?Pulse: 69   ?Resp: 16   ?Temp: (!) 97 ?F (36.1 ?C)   ?SpO2: 93%   ?Weight: 122 lb 3.2 oz (55.4 kg)   ?Height: '5\' 5"'$  (1.651 m)   ?PainSc:  7   ? ?Body mass index is 20.34 kg/m?. ? ? ?  03/20/2022  ?  9:15 AM 03/16/2022  ?  2:38 PM 03/06/2022  ? 11:54 AM 02/27/2022  ? 11:36 AM 02/13/2022  ?  2:11 PM 02/10/2022  ? 10:30 PM 02/10/2022  ? 12:15 PM  ?Advanced Directives  ?Does Patient Have a Medical Advance Directive? Yes Yes Yes Yes Yes Yes Yes  ?Type of AParamedicof A[place]ass[place]Living will;Out of facility DNR (pink MOST or yellow form) HBrown CityLiving will;Out of facility DNR (pink MOST or yellow form) HHaiku-PauwelaLiving will;Out of facility DNR (pink MOST or yellow form) HKingston SpringsLiving will HCottonwood ShoresLiving will Healthcare Power of AShoreacresLiving will  ?Does patient want to make changes to medical advance directive? No - Patient declined No - Patient declined No - Patient declined No - Patient declined No - Patient declined No - Patient declined No - Patient declined  ?Copy of [place] Chart? Yes - validated most recent copy scanned in chart (See row information) Yes - validated most recent copy scanned in chart (See row information) Yes - validated most recent copy scanned in chart (See row information) Yes - validated most recent copy scanned in chart (See row information) Yes - validated most recent copy scanned in chart (See row information) Yes - validated most recent copy scanned in chart (See row information) Yes - validated most recent copy scanned in chart (See  row information)  ? ? ?Current Medications (verified) ?Outpatient Encounter Medications as of 03/20/2022  ?Medication Sig  ? acetaminophen (TYLENOL) 500 MG tablet Take 1,000 mg by mouth 2 (two) times daily.  ? amLODipine (NORVASC) 5 MG tablet Take 5 mg by mouth daily.  ? aspirin 81 MG chewable tablet Chew 1 tablet (81 mg total) by mouth at bedtime.  ? atorvastatin (LIPITOR) 40 MG tablet Take 40 mg by mouth at bedtime.  ? cetirizine (ZYRTEC) 10 MG tablet Take 10 mg by mouth every morning.  ? cholecalciferol (VITAMIN D) 25 MCG (1000 UNIT) tablet Take 1,000 Units by mouth every evening.  ? ferrous sulfate 325 (65 FE) MG tablet Take 1 tablet (325 mg total) by mouth daily with breakfast.  ? folic acid (FOLVITE) 1 MG tablet Take 1 tablet (1 mg total) by mouth daily.  ? hydrochlorothiazide (HYDRODIURIL) 25 MG tablet Take 25 mg by mouth in the morning.  ? losartan (COZAAR) 100 MG tablet Take 100 mg by mouth at bedtime.  ? metoprolol succinate (TOPROL-XL) 50 MG 24 hr tablet Take 50 mg by mouth every evening. Take with or immediately following a meal.  ? nitroGLYCERIN (NITROSTAT) 0.4 MG SL tablet Place 1 tablet (0.4 mg total) under the tongue every 5 (five) minutes as needed for chest pain.  ? OLANZapine (ZYPREXA) 2.5 MG tablet Take 2.5 mg by mouth at bedtime.  ?  pantoprazole (PROTONIX) 40 MG tablet Take 1 tablet (40 mg total) by mouth daily.  ? senna (SENOKOT) 8.6 MG TABS tablet Take 1 tablet by mouth in the morning.  ? sertraline (ZOLOFT) 25 MG tablet Take 25 mg by mouth at bedtime.  ? ?No facility-administered encounter medications on file as of 03/20/2022.  ? ? ?Allergies (verified) ?Latex, Nifedipine, Tetanus toxoids, Tetanus-diphtheria toxoids td, and Adhesive [tape]  ? ?History: ?Past Medical History:  ?Diagnosis Date  ? Anxiety and depression   ? Aortic stenosis   ? a. 03/2014: Mild aortic stenosis by valve area and mean gradient though appeared more visually consistent with moderate AS.   ? CAD (coronary artery  disease)   ? a. s/p prior PCI of D2 in 1992 b. stent to RCA in 2005  ? Diverticula of colon   ? GERD (gastroesophageal reflux disease)   ? HTN (hypertension)   ? Hypercholesteremia   ? Lateral myocardial infarction [facility]) 1992  ? Osteoarthritis   ? Osteopenia   ? Scoliosis   ? ?Past Surgical History:  ?Procedure Laterality Date  ? ABDOMINAL HYSTERECTOMY    ? APPENDECTOMY    ? CORONARY ANGIOPLASTY    ? TONSILLECTOMY    ? ?Family History  ?Problem Relation Age of Onset  ? Hypertension Father   ? Heart failure Mother   ? Hypertension Mother   ? Cancer Brother   ? ?Social History  ? ?Socioeconomic History  ? Marital status: Divorced  ?  Spouse name: Not on file  ? Number of children: 1  ? Years of education: Not on file  ? Highest education level: Not on file  ?Occupational History  ? Occupation: retired  ?Tobacco Use  ? Smoking status: Former  ?  Types: Cigarettes  ?  Quit date: 12/26/1991  ?  Years since quitting: 30.2  ? Smokeless tobacco: Never  ?Vaping Use  ? Vaping Use: Never used  ?Substance and Sexual Activity  ? Alcohol use: No  ?  Alcohol/week: 0.0 standard drinks  ? Drug use: Never  ? Sexual activity: Not on file  ?Other Topics Concern  ? Not on file  ?Social History Narrative  ? Not on file  ? ?Social Determinants of Health  ? ?Financial Resource Strain: Low Risk   ? Difficulty of Paying Living Expenses: Not hard at all  ?Food Insecurity: No Food Insecurity  ? Worried About Charity fundraiser in the Last Year: Never true  ? Ran Out of Food in the Last Year: Never true  ?Transportation Needs: No Transportation Needs  ? Lack of Transportation (Medical): No  ? Lack of Transportation (Non-Medical): No  ?Physical Activity: Insufficiently Active  ? Days of Exercise per Week: 5 days  ? Minutes of Exercise per Session: 20 min  ?Stress: No Stress Concern Present  ? Feeling of Stress : Not at all  ?Social Connections: Socially Isolated  ? Frequency of Communication with Friends and Family: Twice a week  ? Frequency of  Social Gatherings with Friends and Family: Twice a week  ? Attends Religious Services: Never  ? Active Member of Clubs or Organizations: No  ? Attends Archivist Meetings: Never  ? Marital Status: Divorced  ? ? ?Tobacco Counseling ?Counseling given: Not Answered ? ? ?Clinical Intake: ? ?Pre-visit preparation completed: Yes ? ?Pain : 0-10 ?Pain Score: 7  ?Pain Type: Chronic pain ?Pain Location: Back ?Pain Orientation: Lower ?Pain Radiating Towards: none ?Pain Descriptors / Indicators: Aching, Sore ?Pain Onset: More than  a month ago ?Pain Frequency: Intermittent ?Pain Relieving Factors: tylenol, changing positions ?Effect of Pain on Daily Activities: none ? ?Pain Relieving Factors: tylenol, changing positions ? ?BMI - recorded: 20.34 ?Nutritional Status: BMI of 19-24  Normal ?Nutritional Risks: Non-healing wound (right lower shin) ?Diabetes: No ? ?How often do you need to have someone help you when you read instructions, pamphlets, or other written materials from your doctor or pharmacy?: 4 - Often ?What is the last grade level you completed in school?: high school ? ?Diabetic?No ? ?Interpreter Needed?: No ? ?  ? ? ?Activities of Daily Living ? ?  03/20/2022  ? 10:23 AM 02/10/2022  ? 10:30 PM  ?In your present state of health, do you have any difficulty performing the following activities:  ?Hearing? 0 0  ?Vision? 0 0  ?Difficulty concentrating or making decisions? 1 0  ?Walking or climbing stairs? 0 1  ?Dressing or bathing? 0 0  ?Doing errands, shopping? 1 0  ?Preparing Food and eating ? Y   ?Using the Toilet? N   ?In the past six months, have you accidently leaked urine? N   ?Do you have problems with loss of bowel control? N   ?Managing your Medications? Y   ?Managing your Finances? Y   ?Housekeeping or managing your Housekeeping? Y   ? ? ?Patient Care Team: ?[name], MD as PCP - General (Internal Medicine) ?Martinique, Peter M, MD as PCP - Cardiology (Cardiology) ? ?Indicate any recent Medical Services  you may have received from other than [facility] providers in the past year (date may be approximate). ? ?   ?Assessment:  ? This is a routine wellness examination for [name]. ? ?Hearing/Vision screen ?No results f

## 2022-03-21 DIAGNOSIS — R296 Repeated falls: Secondary | ICD-10-CM | POA: Diagnosis not present

## 2022-03-21 DIAGNOSIS — R41841 Cognitive communication deficit: Secondary | ICD-10-CM | POA: Diagnosis not present

## 2022-03-21 DIAGNOSIS — M6281 Muscle weakness (generalized): Secondary | ICD-10-CM | POA: Diagnosis not present

## 2022-03-21 DIAGNOSIS — R2681 Unsteadiness on feet: Secondary | ICD-10-CM | POA: Diagnosis not present

## 2022-03-21 DIAGNOSIS — M810 Age-related osteoporosis without current pathological fracture: Secondary | ICD-10-CM | POA: Diagnosis not present

## 2022-03-21 DIAGNOSIS — Z9181 History of falling: Secondary | ICD-10-CM | POA: Diagnosis not present

## 2022-03-23 DIAGNOSIS — R41841 Cognitive communication deficit: Secondary | ICD-10-CM | POA: Diagnosis not present

## 2022-03-23 DIAGNOSIS — R296 Repeated falls: Secondary | ICD-10-CM | POA: Diagnosis not present

## 2022-03-23 DIAGNOSIS — M810 Age-related osteoporosis without current pathological fracture: Secondary | ICD-10-CM | POA: Diagnosis not present

## 2022-03-23 DIAGNOSIS — Z9181 History of falling: Secondary | ICD-10-CM | POA: Diagnosis not present

## 2022-03-23 DIAGNOSIS — M6281 Muscle weakness (generalized): Secondary | ICD-10-CM | POA: Diagnosis not present

## 2022-03-23 DIAGNOSIS — R2681 Unsteadiness on feet: Secondary | ICD-10-CM | POA: Diagnosis not present

## 2022-03-24 DIAGNOSIS — R2681 Unsteadiness on feet: Secondary | ICD-10-CM | POA: Diagnosis not present

## 2022-03-24 DIAGNOSIS — R41841 Cognitive communication deficit: Secondary | ICD-10-CM | POA: Diagnosis not present

## 2022-03-24 DIAGNOSIS — M810 Age-related osteoporosis without current pathological fracture: Secondary | ICD-10-CM | POA: Diagnosis not present

## 2022-03-24 DIAGNOSIS — M6281 Muscle weakness (generalized): Secondary | ICD-10-CM | POA: Diagnosis not present

## 2022-03-24 DIAGNOSIS — Z9181 History of falling: Secondary | ICD-10-CM | POA: Diagnosis not present

## 2022-03-24 DIAGNOSIS — R296 Repeated falls: Secondary | ICD-10-CM | POA: Diagnosis not present

## 2022-03-27 DIAGNOSIS — R2681 Unsteadiness on feet: Secondary | ICD-10-CM | POA: Diagnosis not present

## 2022-03-27 DIAGNOSIS — M810 Age-related osteoporosis without current pathological fracture: Secondary | ICD-10-CM | POA: Diagnosis not present

## 2022-03-27 DIAGNOSIS — Z9181 History of falling: Secondary | ICD-10-CM | POA: Diagnosis not present

## 2022-03-27 DIAGNOSIS — M6281 Muscle weakness (generalized): Secondary | ICD-10-CM | POA: Diagnosis not present

## 2022-03-27 DIAGNOSIS — R296 Repeated falls: Secondary | ICD-10-CM | POA: Diagnosis not present

## 2022-03-27 DIAGNOSIS — R278 Other lack of coordination: Secondary | ICD-10-CM | POA: Diagnosis not present

## 2022-03-27 DIAGNOSIS — M6389 Disorders of muscle in diseases classified elsewhere, multiple sites: Secondary | ICD-10-CM | POA: Diagnosis not present

## 2022-03-28 DIAGNOSIS — M6281 Muscle weakness (generalized): Secondary | ICD-10-CM | POA: Diagnosis not present

## 2022-03-28 DIAGNOSIS — R296 Repeated falls: Secondary | ICD-10-CM | POA: Diagnosis not present

## 2022-03-28 DIAGNOSIS — R2681 Unsteadiness on feet: Secondary | ICD-10-CM | POA: Diagnosis not present

## 2022-03-28 DIAGNOSIS — M6389 Disorders of muscle in diseases classified elsewhere, multiple sites: Secondary | ICD-10-CM | POA: Diagnosis not present

## 2022-03-28 DIAGNOSIS — M810 Age-related osteoporosis without current pathological fracture: Secondary | ICD-10-CM | POA: Diagnosis not present

## 2022-03-28 DIAGNOSIS — Z9181 History of falling: Secondary | ICD-10-CM | POA: Diagnosis not present

## 2022-03-29 DIAGNOSIS — Z9181 History of falling: Secondary | ICD-10-CM | POA: Diagnosis not present

## 2022-03-29 DIAGNOSIS — M6281 Muscle weakness (generalized): Secondary | ICD-10-CM | POA: Diagnosis not present

## 2022-03-29 DIAGNOSIS — R2681 Unsteadiness on feet: Secondary | ICD-10-CM | POA: Diagnosis not present

## 2022-03-29 DIAGNOSIS — R296 Repeated falls: Secondary | ICD-10-CM | POA: Diagnosis not present

## 2022-03-29 DIAGNOSIS — M810 Age-related osteoporosis without current pathological fracture: Secondary | ICD-10-CM | POA: Diagnosis not present

## 2022-03-29 DIAGNOSIS — M6389 Disorders of muscle in diseases classified elsewhere, multiple sites: Secondary | ICD-10-CM | POA: Diagnosis not present

## 2022-03-31 DIAGNOSIS — Z9181 History of falling: Secondary | ICD-10-CM | POA: Diagnosis not present

## 2022-03-31 DIAGNOSIS — R2681 Unsteadiness on feet: Secondary | ICD-10-CM | POA: Diagnosis not present

## 2022-03-31 DIAGNOSIS — M6281 Muscle weakness (generalized): Secondary | ICD-10-CM | POA: Diagnosis not present

## 2022-03-31 DIAGNOSIS — M810 Age-related osteoporosis without current pathological fracture: Secondary | ICD-10-CM | POA: Diagnosis not present

## 2022-03-31 DIAGNOSIS — R296 Repeated falls: Secondary | ICD-10-CM | POA: Diagnosis not present

## 2022-03-31 DIAGNOSIS — M6389 Disorders of muscle in diseases classified elsewhere, multiple sites: Secondary | ICD-10-CM | POA: Diagnosis not present

## 2022-04-03 DIAGNOSIS — M6281 Muscle weakness (generalized): Secondary | ICD-10-CM | POA: Diagnosis not present

## 2022-04-03 DIAGNOSIS — Z9181 History of falling: Secondary | ICD-10-CM | POA: Diagnosis not present

## 2022-04-03 DIAGNOSIS — R2681 Unsteadiness on feet: Secondary | ICD-10-CM | POA: Diagnosis not present

## 2022-04-03 DIAGNOSIS — M810 Age-related osteoporosis without current pathological fracture: Secondary | ICD-10-CM | POA: Diagnosis not present

## 2022-04-03 DIAGNOSIS — R296 Repeated falls: Secondary | ICD-10-CM | POA: Diagnosis not present

## 2022-04-03 DIAGNOSIS — M6389 Disorders of muscle in diseases classified elsewhere, multiple sites: Secondary | ICD-10-CM | POA: Diagnosis not present

## 2022-04-04 ENCOUNTER — Encounter: Payer: Self-pay | Admitting: Nurse Practitioner

## 2022-04-04 ENCOUNTER — Non-Acute Institutional Stay: Payer: Medicare Other | Admitting: Nurse Practitioner

## 2022-04-04 DIAGNOSIS — R2681 Unsteadiness on feet: Secondary | ICD-10-CM | POA: Diagnosis not present

## 2022-04-04 DIAGNOSIS — I10 Essential (primary) hypertension: Secondary | ICD-10-CM | POA: Diagnosis not present

## 2022-04-04 DIAGNOSIS — R296 Repeated falls: Secondary | ICD-10-CM | POA: Diagnosis not present

## 2022-04-04 DIAGNOSIS — I350 Nonrheumatic aortic (valve) stenosis: Secondary | ICD-10-CM | POA: Diagnosis not present

## 2022-04-04 DIAGNOSIS — D509 Iron deficiency anemia, unspecified: Secondary | ICD-10-CM | POA: Diagnosis not present

## 2022-04-04 DIAGNOSIS — I2510 Atherosclerotic heart disease of native coronary artery without angina pectoris: Secondary | ICD-10-CM | POA: Diagnosis not present

## 2022-04-04 DIAGNOSIS — F32A Depression, unspecified: Secondary | ICD-10-CM

## 2022-04-04 DIAGNOSIS — I83891 Varicose veins of right lower extremities with other complications: Secondary | ICD-10-CM

## 2022-04-04 DIAGNOSIS — I480 Paroxysmal atrial fibrillation: Secondary | ICD-10-CM

## 2022-04-04 DIAGNOSIS — I2583 Coronary atherosclerosis due to lipid rich plaque: Secondary | ICD-10-CM

## 2022-04-04 DIAGNOSIS — E7800 Pure hypercholesterolemia, unspecified: Secondary | ICD-10-CM

## 2022-04-04 DIAGNOSIS — F01518 Vascular dementia, unspecified severity, with other behavioral disturbance: Secondary | ICD-10-CM | POA: Diagnosis not present

## 2022-04-04 DIAGNOSIS — M810 Age-related osteoporosis without current pathological fracture: Secondary | ICD-10-CM | POA: Diagnosis not present

## 2022-04-04 DIAGNOSIS — F419 Anxiety disorder, unspecified: Secondary | ICD-10-CM | POA: Diagnosis not present

## 2022-04-04 DIAGNOSIS — K5901 Slow transit constipation: Secondary | ICD-10-CM | POA: Insufficient documentation

## 2022-04-04 DIAGNOSIS — K219 Gastro-esophageal reflux disease without esophagitis: Secondary | ICD-10-CM

## 2022-04-04 DIAGNOSIS — I83019 Varicose veins of right lower extremity with ulcer of unspecified site: Secondary | ICD-10-CM | POA: Diagnosis not present

## 2022-04-04 DIAGNOSIS — M6389 Disorders of muscle in diseases classified elsewhere, multiple sites: Secondary | ICD-10-CM | POA: Diagnosis not present

## 2022-04-04 DIAGNOSIS — R600 Localized edema: Secondary | ICD-10-CM | POA: Insufficient documentation

## 2022-04-04 DIAGNOSIS — M6281 Muscle weakness (generalized): Secondary | ICD-10-CM | POA: Diagnosis not present

## 2022-04-04 DIAGNOSIS — M154 Erosive (osteo)arthritis: Secondary | ICD-10-CM | POA: Insufficient documentation

## 2022-04-04 DIAGNOSIS — Z9181 History of falling: Secondary | ICD-10-CM | POA: Diagnosis not present

## 2022-04-04 DIAGNOSIS — I5189 Other ill-defined heart diseases: Secondary | ICD-10-CM | POA: Diagnosis not present

## 2022-04-04 DIAGNOSIS — L97919 Non-pressure chronic ulcer of unspecified part of right lower leg with unspecified severity: Secondary | ICD-10-CM

## 2022-04-04 NOTE — Assessment & Plan Note (Signed)
takes Senokot, stable.  ?

## 2022-04-04 NOTE — Assessment & Plan Note (Addendum)
Blood pressure is controlled, on Amlodipine, HCTZ, Losartan, Metoprolol.  ?

## 2022-04-04 NOTE — Assessment & Plan Note (Signed)
Hx of PRBC transfusion, IV iron. Now po Iron, folic acid, Hgb 70.3 03/28/51 ?

## 2022-04-04 NOTE — Assessment & Plan Note (Signed)
takes Tylenol, ambulates with walker.  

## 2022-04-04 NOTE — Assessment & Plan Note (Signed)
Grade I Diastolic dysfunction, echo 09/21/20 LVEF 65-70% ?

## 2022-04-04 NOTE — Assessment & Plan Note (Signed)
Her mood is stable, continue Sertraline.  

## 2022-04-04 NOTE — Progress Notes (Signed)
?Location:   [facility] Room Number: 18 ?Place of Service:  ALF (13) ?Provider:  Marlana Latus NP ? ?Erin Dad, MD ? ?Patient Care Team: ?Erin Dad, MD as PCP - General (Internal Medicine) ?Martinique, Peter M, MD as PCP - Cardiology (Cardiology) ? ?Extended Emergency Contact Information ?Primary Emergency Contact: [name],Patricia ?Address: [address] ?         [place] [address] [place] ?Home Phone: [phone] ?Mobile Phone: [phone] ?Relation: Sister ?Secondary Emergency Contact: [name],[name] ? [place] ?Home Phone: [phone] ?Mobile Phone: [phone] ?Relation: Niece ? ?Code Status:  DNR ?Goals of care: Advanced Directive information ? ?  03/20/2022  ?  9:15 AM  ?Advanced Directives  ?Does Patient Have a Medical Advance Directive? Yes  ?Type of Paramedic of [place];Living will;Out of facility DNR (pink MOST or yellow form)  ?Does patient want to make changes to medical advance directive? No - Patient declined  ?Copy of [place] in Chart? Yes - validated most recent copy scanned in chart (See row information)  ? ? ? ?Chief Complaint  ?Patient presents with  ? Acute Visit  ?  Slow healing stasis ulcer RLE  ? ? ?HPI:  ?Pt is a 88 y.o. female seen today for an acute visit for the right lower leg stasis ulcer with yellow slough wound bed, mild erythema skin peri wound, no purulent odorous drainage, pain is localized at the wound site. RLE is mild swelling.  ? CAD, demand ischemia, stent 1992 and 2005,  followed by Cardiology, Dr. Martinique. On ASA, statin, beta blocker.  ?            Grade I Diastolic dysfunction, echo 09/21/20 LVEF 65-70% ?            PAF, converted to SR 08/2020, on Metoprolol, not on anticoagulation due to falls ?            HTN, on Amlodipine, HCTZ, Losartan, Metoprolol.  ?            Hyperlipidemia, takes Atorvastatin, LDL 27 01/23/22 ?            AS monitor, DOE ?             IDA Hx of PRBC transfusion, IV iron. Now po Iron, folic acid, Hgb 71.6 9/67/89 ?            Dementia, CT 5/22 chronic vascular changes, MMSE 16/30, TSH 2.9 11/24/21, on Zyprexa, Sertraline ?            Depression, Sertraline ? OA general, takes Tylenol.  ? GERD, stable, on Pantoprazole.  ? Constipation, takes Senokot.  ?  ? ? ?Past Medical History:  ?Diagnosis Date  ? Anxiety and depression   ? Aortic stenosis   ? a. 03/2014: Mild aortic stenosis by valve area and mean gradient though appeared more visually consistent with moderate AS.   ? CAD (coronary artery disease)   ? a. s/p prior PCI of D2 in 1992 b. stent to RCA in 2005  ? Diverticula of colon   ? GERD (gastroesophageal reflux disease)   ? HTN (hypertension)   ? Hypercholesteremia   ? Lateral myocardial infarction [facility]) 1992  ? Osteoarthritis   ? Osteopenia   ? Scoliosis   ? ?Past Surgical History:  ?Procedure Laterality Date  ? ABDOMINAL HYSTERECTOMY    ? APPENDECTOMY    ? CORONARY ANGIOPLASTY    ? TONSILLECTOMY    ? ? ?  Allergies  ?Allergen Reactions  ? Latex Other (See Comments)  ?  Unknown reaction - listed on [facility] 02/10/22  ? Nifedipine Other (See Comments)  ?  Dark Urine; can only take orange tablet, allergic to yellow-brown tablet  ? Tetanus Toxoids Other (See Comments)  ?  Unknown reaction  ? Tetanus-Diphtheria Toxoids Td Other (See Comments)  ?  Unknown reaction  ? Adhesive [Tape] Rash  ? ? ?Allergies as of 04/04/2022   ? ?   Reactions  ? Latex Other (See Comments)  ? Unknown reaction - listed on [facility] 02/10/22  ? Nifedipine Other (See Comments)  ? Dark Urine; can only take orange tablet, allergic to yellow-brown tablet  ? Tetanus Toxoids Other (See Comments)  ? Unknown reaction  ? Tetanus-diphtheria Toxoids Td Other (See Comments)  ? Unknown reaction  ? Adhesive [tape] Rash  ? ?  ? ?  ?Medication List  ?  ? ?  ? Accurate as of April 04, 2022 11:59 PM. If you have any questions, ask your nurse or doctor.  ?  ?  ? ?  ? ?acetaminophen 500 MG tablet ?Commonly  known as: TYLENOL ?Take 1,000 mg by mouth 2 (two) times daily. ?  ?amLODipine 5 MG tablet ?Commonly known as: NORVASC ?Take 5 mg by mouth daily. ?  ?aspirin 81 MG chewable tablet ?Chew 1 tablet (81 mg total) by mouth at bedtime. ?  ?atorvastatin 40 MG tablet ?Commonly known as: LIPITOR ?Take 30 mg by mouth at bedtime. ?  ?cetirizine 10 MG tablet ?Commonly known as: ZYRTEC ?Take 10 mg by mouth every morning. ?  ?cholecalciferol 25 MCG (1000 UNIT) tablet ?Commonly known as: VITAMIN D ?Take 1,000 Units by mouth every evening. ?  ?collagenase 250 UNIT/GM ointment ?Commonly known as: SANTYL ?Apply 1 application. topically daily. ?  ?ferrous sulfate 325 (65 FE) MG tablet ?Take 1 tablet (325 mg total) by mouth daily with breakfast. ?  ?folic acid 1 MG tablet ?Commonly known as: FOLVITE ?Take 1 tablet (1 mg total) by mouth daily. ?  ?hydrochlorothiazide 25 MG tablet ?Commonly known as: HYDRODIURIL ?Take 25 mg by mouth in the morning. ?  ?losartan 100 MG tablet ?Commonly known as: COZAAR ?Take 100 mg by mouth at bedtime. ?  ?metoprolol succinate 50 MG 24 hr tablet ?Commonly known as: TOPROL-XL ?Take 50 mg by mouth every evening. Take with or immediately following a meal. ?  ?mupirocin ointment 2 % ?Commonly known as: BACTROBAN ?Apply 1 application. topically daily. ?  ?nitroGLYCERIN 0.4 MG SL tablet ?Commonly known as: Nitrostat ?Place 1 tablet (0.4 mg total) under the tongue every 5 (five) minutes as needed for chest pain. ?  ?OLANZapine 2.5 MG tablet ?Commonly known as: ZYPREXA ?Take 2.5 mg by mouth at bedtime. ?  ?pantoprazole 40 MG tablet ?Commonly known as: PROTONIX ?Take 1 tablet (40 mg total) by mouth daily. ?  ?senna 8.6 MG Tabs tablet ?Commonly known as: SENOKOT ?Take 1 tablet by mouth in the morning. ?  ?sertraline 25 MG tablet ?Commonly known as: ZOLOFT ?Take 25 mg by mouth at bedtime. ?  ? ?  ? ? ?Review of Systems  ?Constitutional:  Negative for appetite change, fatigue and fever.  ?HENT:  Positive for hearing  loss. Negative for congestion and trouble swallowing.   ?Eyes:  Negative for visual disturbance.  ?Respiratory:  Positive for shortness of breath. Negative for cough, chest tightness and wheezing.   ?     DOE  ?Gastrointestinal:  Negative for abdominal pain and  constipation.  ?Genitourinary:  Negative for dysuria, frequency and urgency.  ?Musculoskeletal:  Positive for arthralgias and gait problem.  ?Skin:  Positive for wound. Negative for color change.  ?Neurological:  Negative for speech difficulty, weakness and light-headedness.  ?     Memory lapses.   ?Psychiatric/Behavioral:  Positive for confusion. Negative for sleep disturbance. The patient is not nervous/anxious.   ? ?Immunization History  ?Administered Date(s) Administered  ? Influenza Split 05/13/2009, 05/18/2010, 09/29/2011, 09/05/2012, 09/03/2013  ? Influenza, High Dose Seasonal PF 09/09/2015, 08/30/2017  ? Influenza, Quadrivalent, Recombinant, Inj, Pf 08/19/2018, 09/29/2019  ? Influenza-Unspecified 10/18/2021  ? Moderna Covid-19 Vaccine Bivalent Booster 26yr & up 09/14/2021  ? Moderna SARS-COV2 Booster Vaccination 06/01/2021  ? Moderna Sars-Covid-2 Vaccination 12/29/2019, 01/26/2020  ? Pneumococcal Conjugate-13 07/08/2013  ? Pneumococcal Polysaccharide-23 06/29/2006, 05/13/2009, 05/18/2010, 05/25/2011  ? Td 05/13/2009, 05/18/2010, 05/25/2011  ? Zoster Recombinat (Shingrix) 08/24/2017, 10/31/2017  ? Zoster, Live 08/22/2008, 08/24/2008, 05/13/2009, 05/18/2010, 05/25/2011  ? ?Pertinent  Health Maintenance Due  ?Topic Date Due  ? INFLUENZA VACCINE  07/25/2022  ? DEXA SCAN  Completed  ? ? ?  05/02/2021  ? 10:07 AM 12/14/2021  ? 12:17 PM 02/10/2022  ? 10:00 PM 02/11/2022  ?  8:00 AM 03/20/2022  ? 10:22 AM  ?Fall Risk  ?Falls in the past year?     1  ?Was there an injury with Fall?     0  ?Fall Risk Category Calculator     1  ?Fall Risk Category     Low  ?Patient Fall Risk Level High fall risk Low fall risk Moderate fall risk Moderate fall risk Moderate fall risk   ?Patient at Risk for Falls Due to     History of fall(s);Impaired balance/gait;Mental status change;Other (Comment)  ?Patient at Risk for Falls Due to - Comments     vascular dementia  ?Fall risk Follow

## 2022-04-04 NOTE — Assessment & Plan Note (Signed)
CT 5/22 chronic vascular changes, MMSE 16/30, TSH 2.9 11/24/21, on Zyprexa, Sertraline ?

## 2022-04-04 NOTE — Assessment & Plan Note (Signed)
stable, on Pantoprazole  

## 2022-04-04 NOTE — Assessment & Plan Note (Signed)
the right lower leg stasis ulcer with yellow slough wound bed, mild erythema skin peri wound, no purulent odorous drainage, pain is localized at the wound site. RLE is mild swelling.  ?Will apply Santyl to the yellow slough wound bed, paint the surrounding erythematous skin with Bactroban, observe.  ?

## 2022-04-04 NOTE — Assessment & Plan Note (Signed)
DOE is chronic ?

## 2022-04-04 NOTE — Assessment & Plan Note (Signed)
demand ischemia, stent 1992 and 2005,  followed by Cardiology, Dr. Martinique. On ASA, statin, beta blocker.  ?

## 2022-04-04 NOTE — Assessment & Plan Note (Signed)
converted to [place] 08/2020, on Metoprolol, not on anticoagulation due to falls ?

## 2022-04-04 NOTE — Assessment & Plan Note (Signed)
takes Atorvastatin, LDL 27 01/23/22 ?

## 2022-04-06 ENCOUNTER — Encounter: Payer: Self-pay | Admitting: Nurse Practitioner

## 2022-04-06 DIAGNOSIS — M810 Age-related osteoporosis without current pathological fracture: Secondary | ICD-10-CM | POA: Diagnosis not present

## 2022-04-06 DIAGNOSIS — M6281 Muscle weakness (generalized): Secondary | ICD-10-CM | POA: Diagnosis not present

## 2022-04-06 DIAGNOSIS — Z9181 History of falling: Secondary | ICD-10-CM | POA: Diagnosis not present

## 2022-04-06 DIAGNOSIS — R2681 Unsteadiness on feet: Secondary | ICD-10-CM | POA: Diagnosis not present

## 2022-04-06 DIAGNOSIS — R296 Repeated falls: Secondary | ICD-10-CM | POA: Diagnosis not present

## 2022-04-06 DIAGNOSIS — M6389 Disorders of muscle in diseases classified elsewhere, multiple sites: Secondary | ICD-10-CM | POA: Diagnosis not present

## 2022-04-10 DIAGNOSIS — R296 Repeated falls: Secondary | ICD-10-CM | POA: Diagnosis not present

## 2022-04-10 DIAGNOSIS — M6389 Disorders of muscle in diseases classified elsewhere, multiple sites: Secondary | ICD-10-CM | POA: Diagnosis not present

## 2022-04-10 DIAGNOSIS — M810 Age-related osteoporosis without current pathological fracture: Secondary | ICD-10-CM | POA: Diagnosis not present

## 2022-04-10 DIAGNOSIS — M6281 Muscle weakness (generalized): Secondary | ICD-10-CM | POA: Diagnosis not present

## 2022-04-10 DIAGNOSIS — R2681 Unsteadiness on feet: Secondary | ICD-10-CM | POA: Diagnosis not present

## 2022-04-10 DIAGNOSIS — Z9181 History of falling: Secondary | ICD-10-CM | POA: Diagnosis not present

## 2022-04-11 DIAGNOSIS — Z9181 History of falling: Secondary | ICD-10-CM | POA: Diagnosis not present

## 2022-04-11 DIAGNOSIS — R2681 Unsteadiness on feet: Secondary | ICD-10-CM | POA: Diagnosis not present

## 2022-04-11 DIAGNOSIS — R296 Repeated falls: Secondary | ICD-10-CM | POA: Diagnosis not present

## 2022-04-11 DIAGNOSIS — M6281 Muscle weakness (generalized): Secondary | ICD-10-CM | POA: Diagnosis not present

## 2022-04-11 DIAGNOSIS — M6389 Disorders of muscle in diseases classified elsewhere, multiple sites: Secondary | ICD-10-CM | POA: Diagnosis not present

## 2022-04-11 DIAGNOSIS — M810 Age-related osteoporosis without current pathological fracture: Secondary | ICD-10-CM | POA: Diagnosis not present

## 2022-05-05 IMAGING — CR DG RIBS W/ CHEST 3+V*R*
3 series · 3 of 3 positions shown · non-contrast
Comparison: 05/02/2021

CLINICAL DATA: Pain, fall

EXAM:
RIGHT RIBS AND CHEST - 3+ VIEW

[t chest supine]
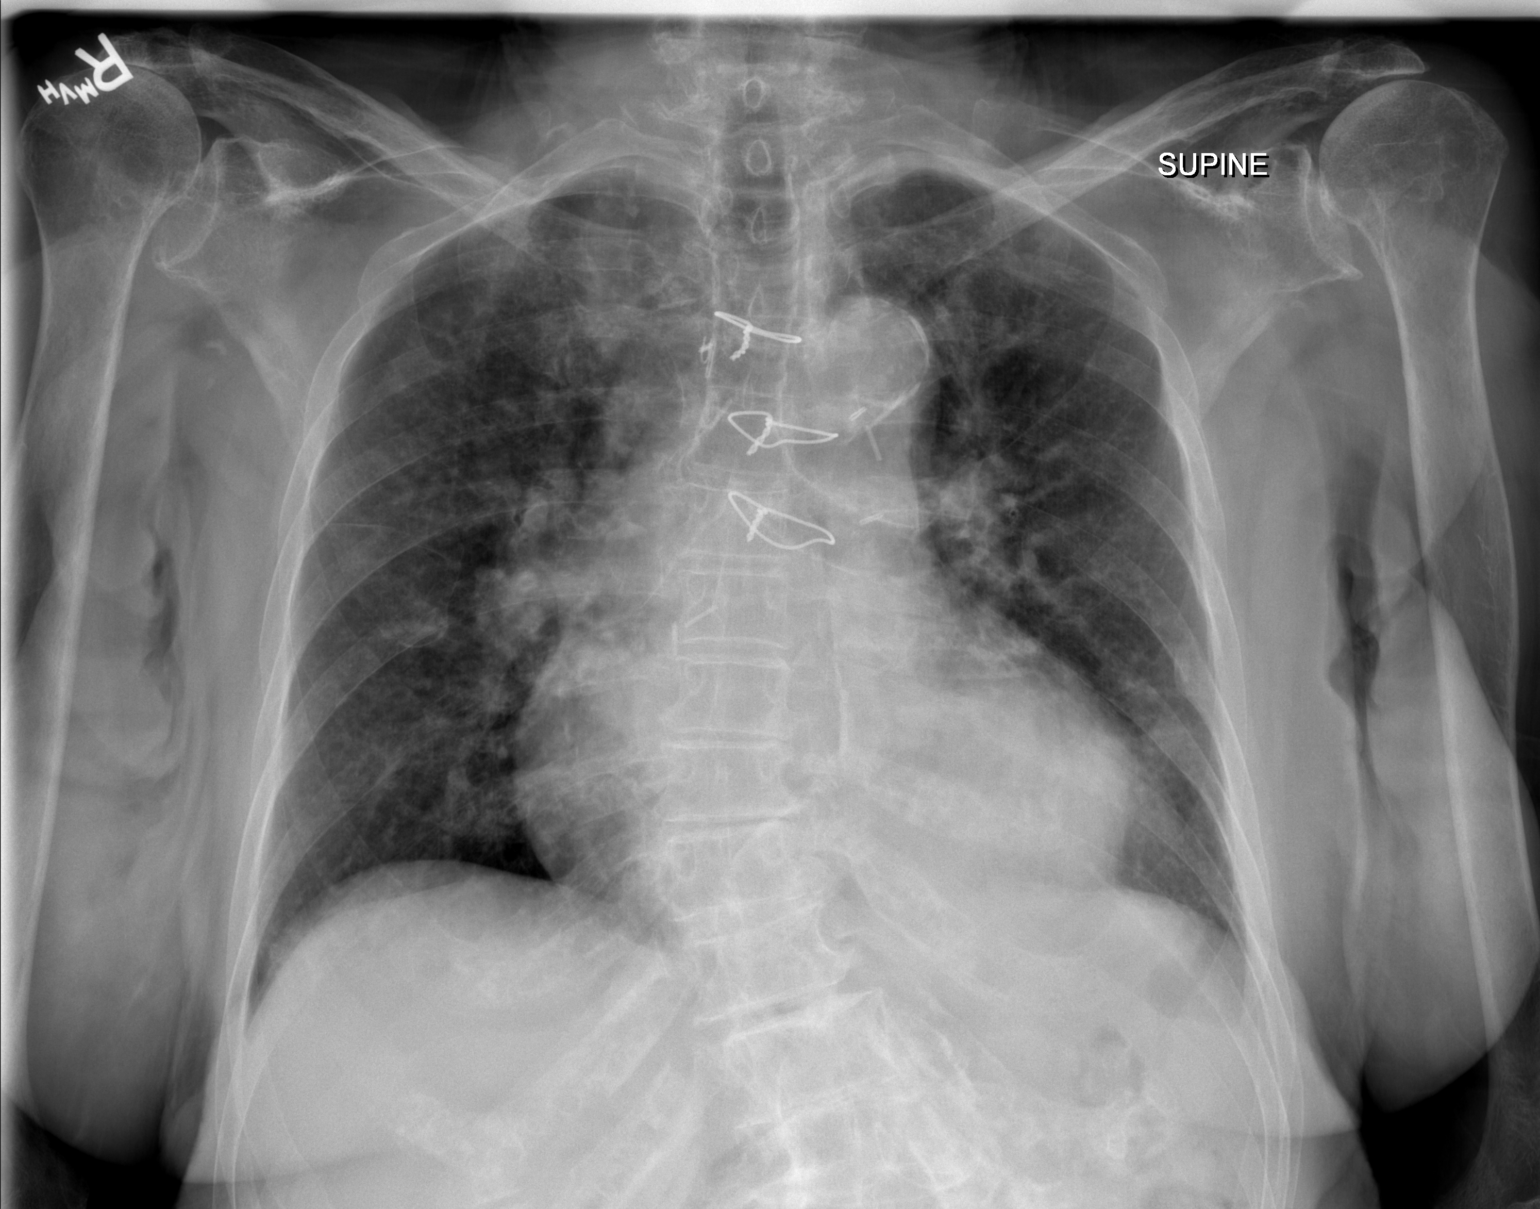

[t ribs ap upper right]
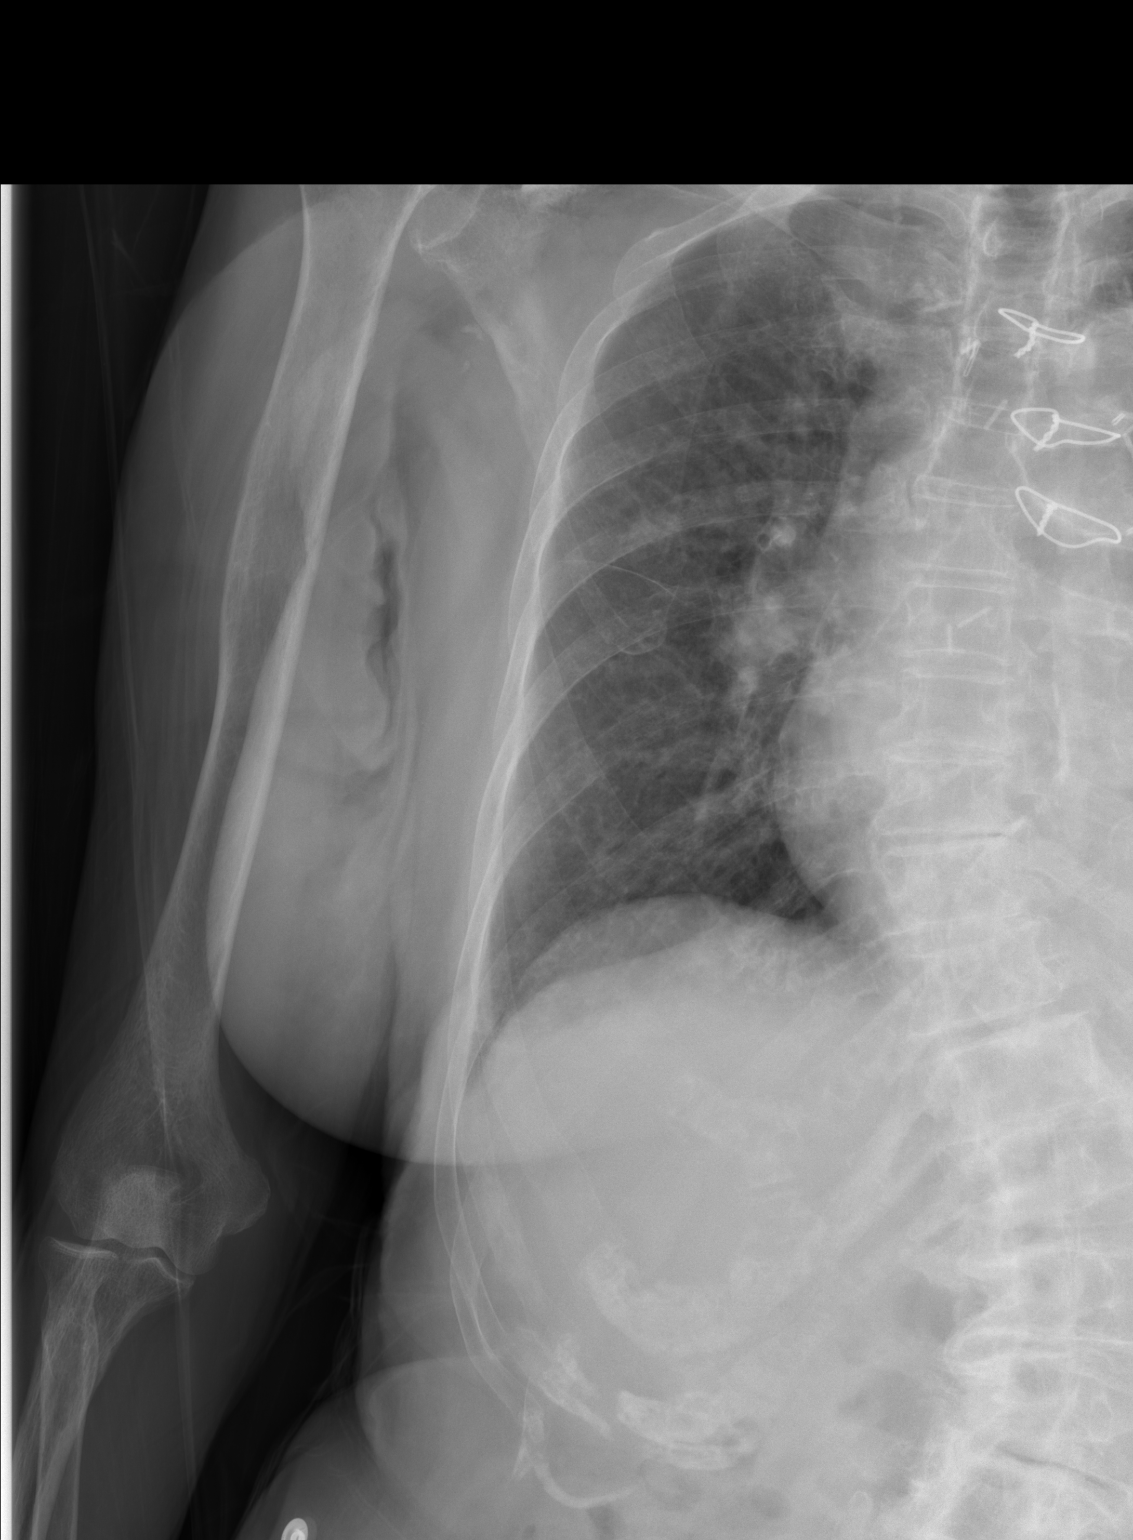

[t ribs rpo right]
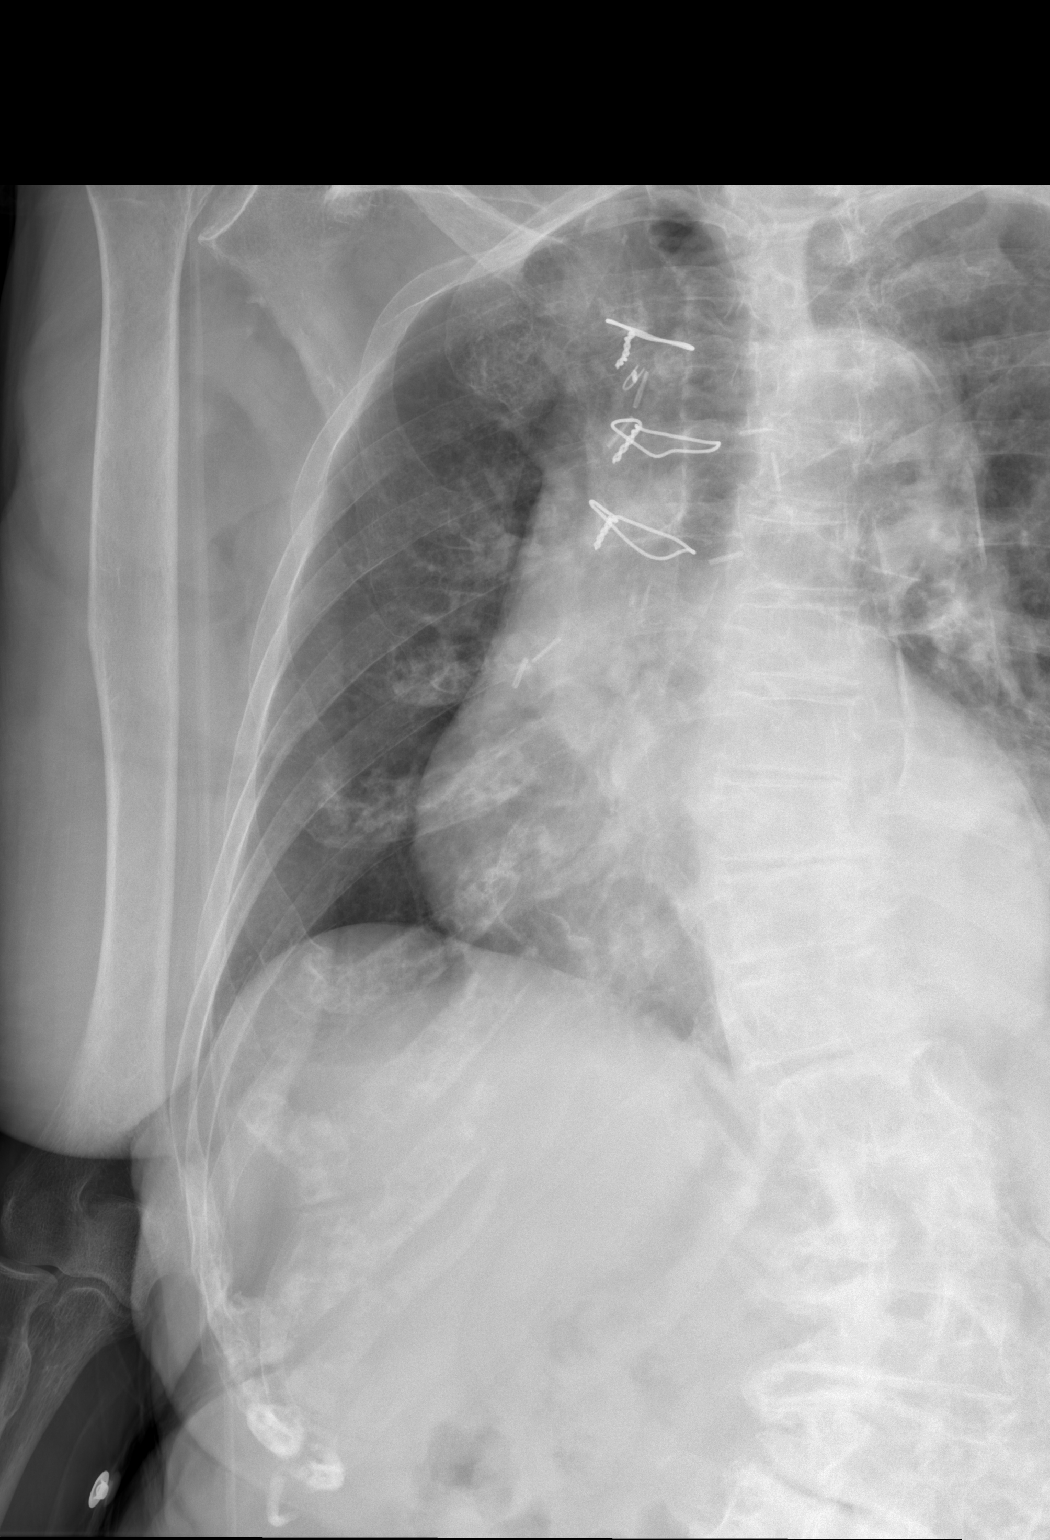

[3 of 3 positions shown; findings below may reference images not displayed]

FINDINGS: No acute displaced fracture or other bone lesions are seen involving
the ribs. Chronic, callused fracture deformities of the left third
through sixth ribs, seen acutely on prior CT. There is no evidence
of pneumothorax or pleural effusion. Cardiomegaly status post median
sternotomy. Pulmonary vascular prominence without overt edema.
IMPRESSION: 1. No acute displaced rib fracture.
2. Chronic, callused fractures of the left third through sixth ribs,
as seen acutely on prior CT.
3. Cardiomegaly and pulmonary vascular prominence without overt
edema.

## 2022-05-05 IMAGING — CR DG LUMBAR SPINE 2-3V
3 series · 3 of 3 positions shown · non-contrast
Comparison: Lumbar spine MRI 08/21/2014.  CT chest 05/02/2021.

CLINICAL DATA: Provided history: Pain. Additional history provided:
Fall earlier today. Patient endorses anterior rib pain and
generalized low back pain.

EXAM:
LUMBAR SPINE - 2-3 VIEW

[t lumbar spine ap]
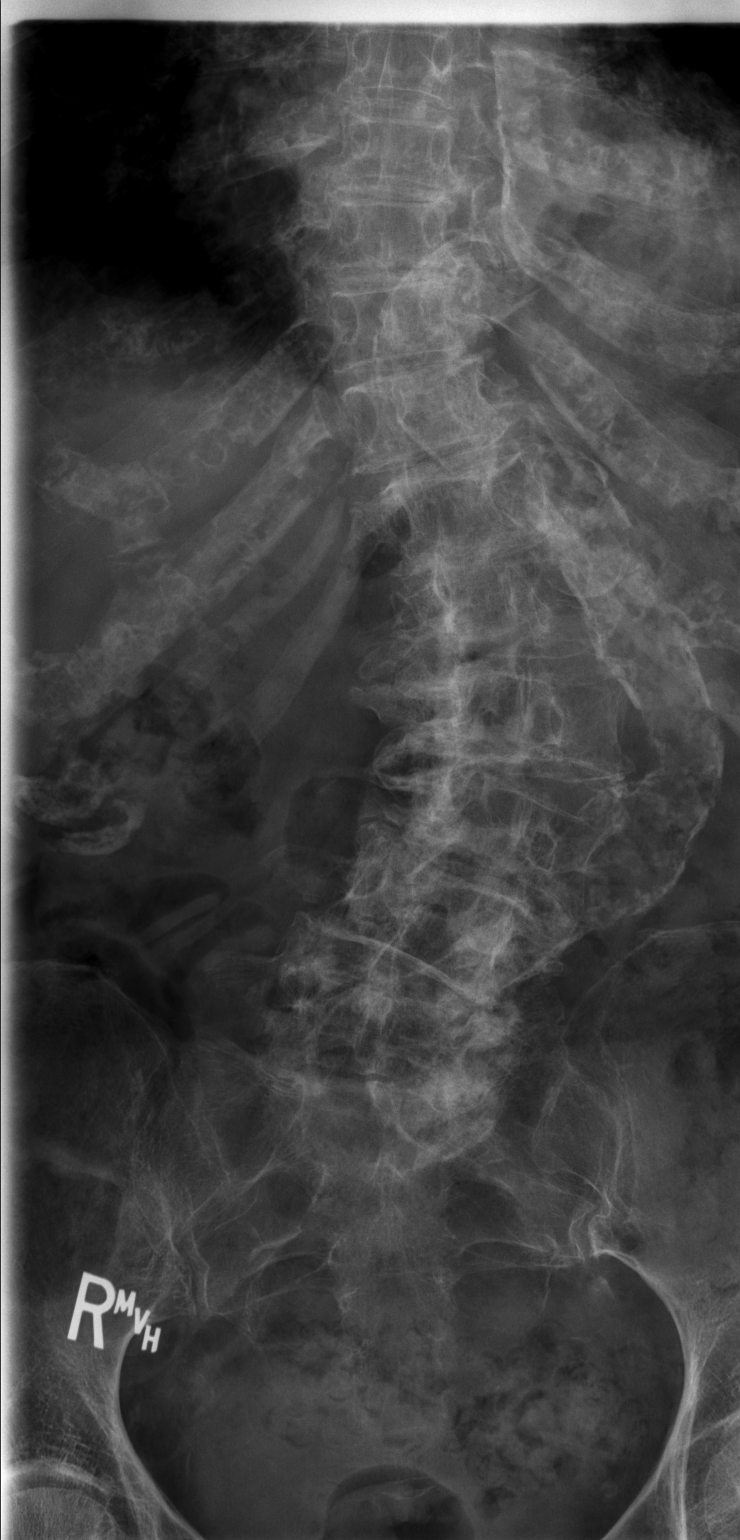

[t lumbar spine lat]
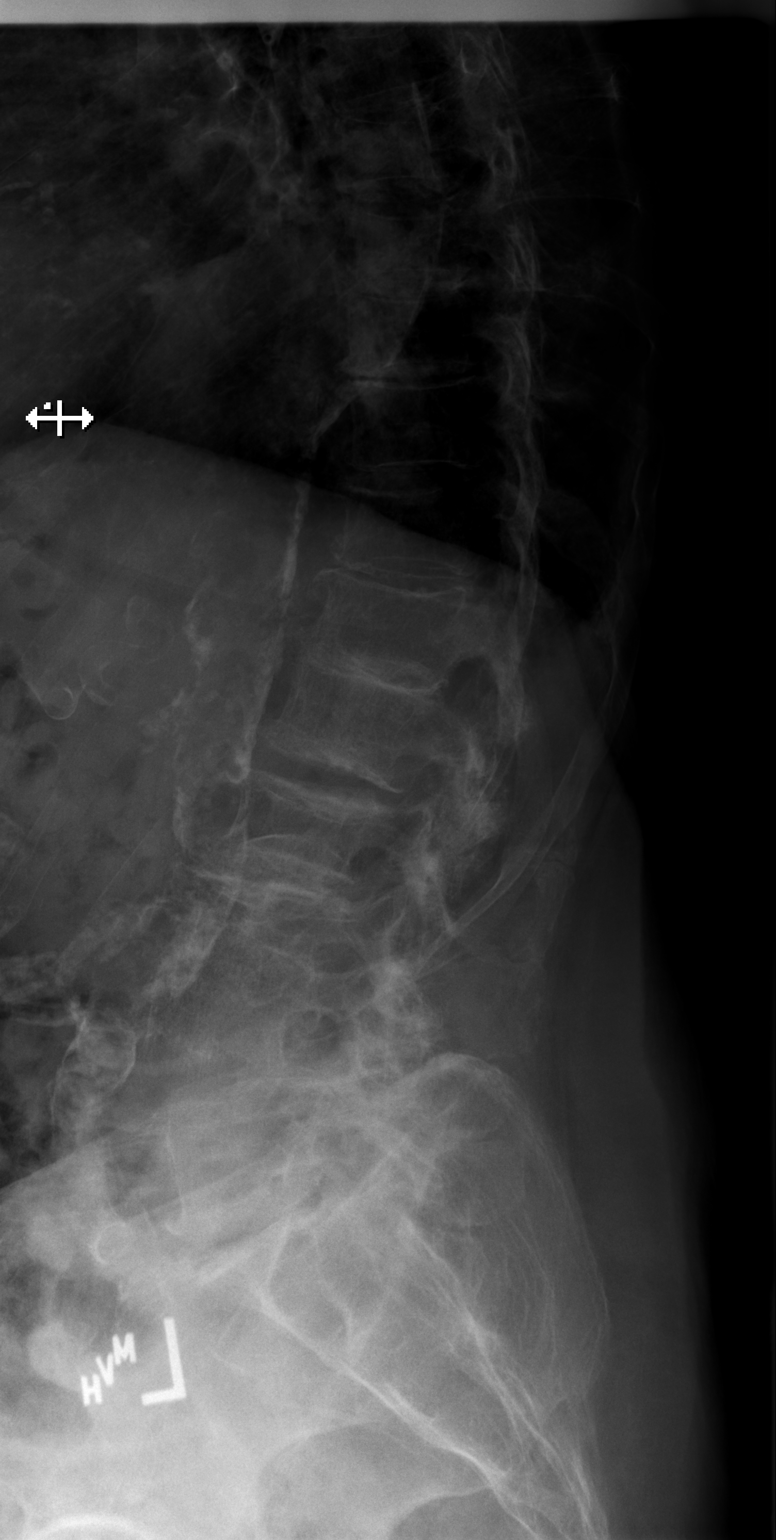

[t lumbar l-5 s-1 spot]
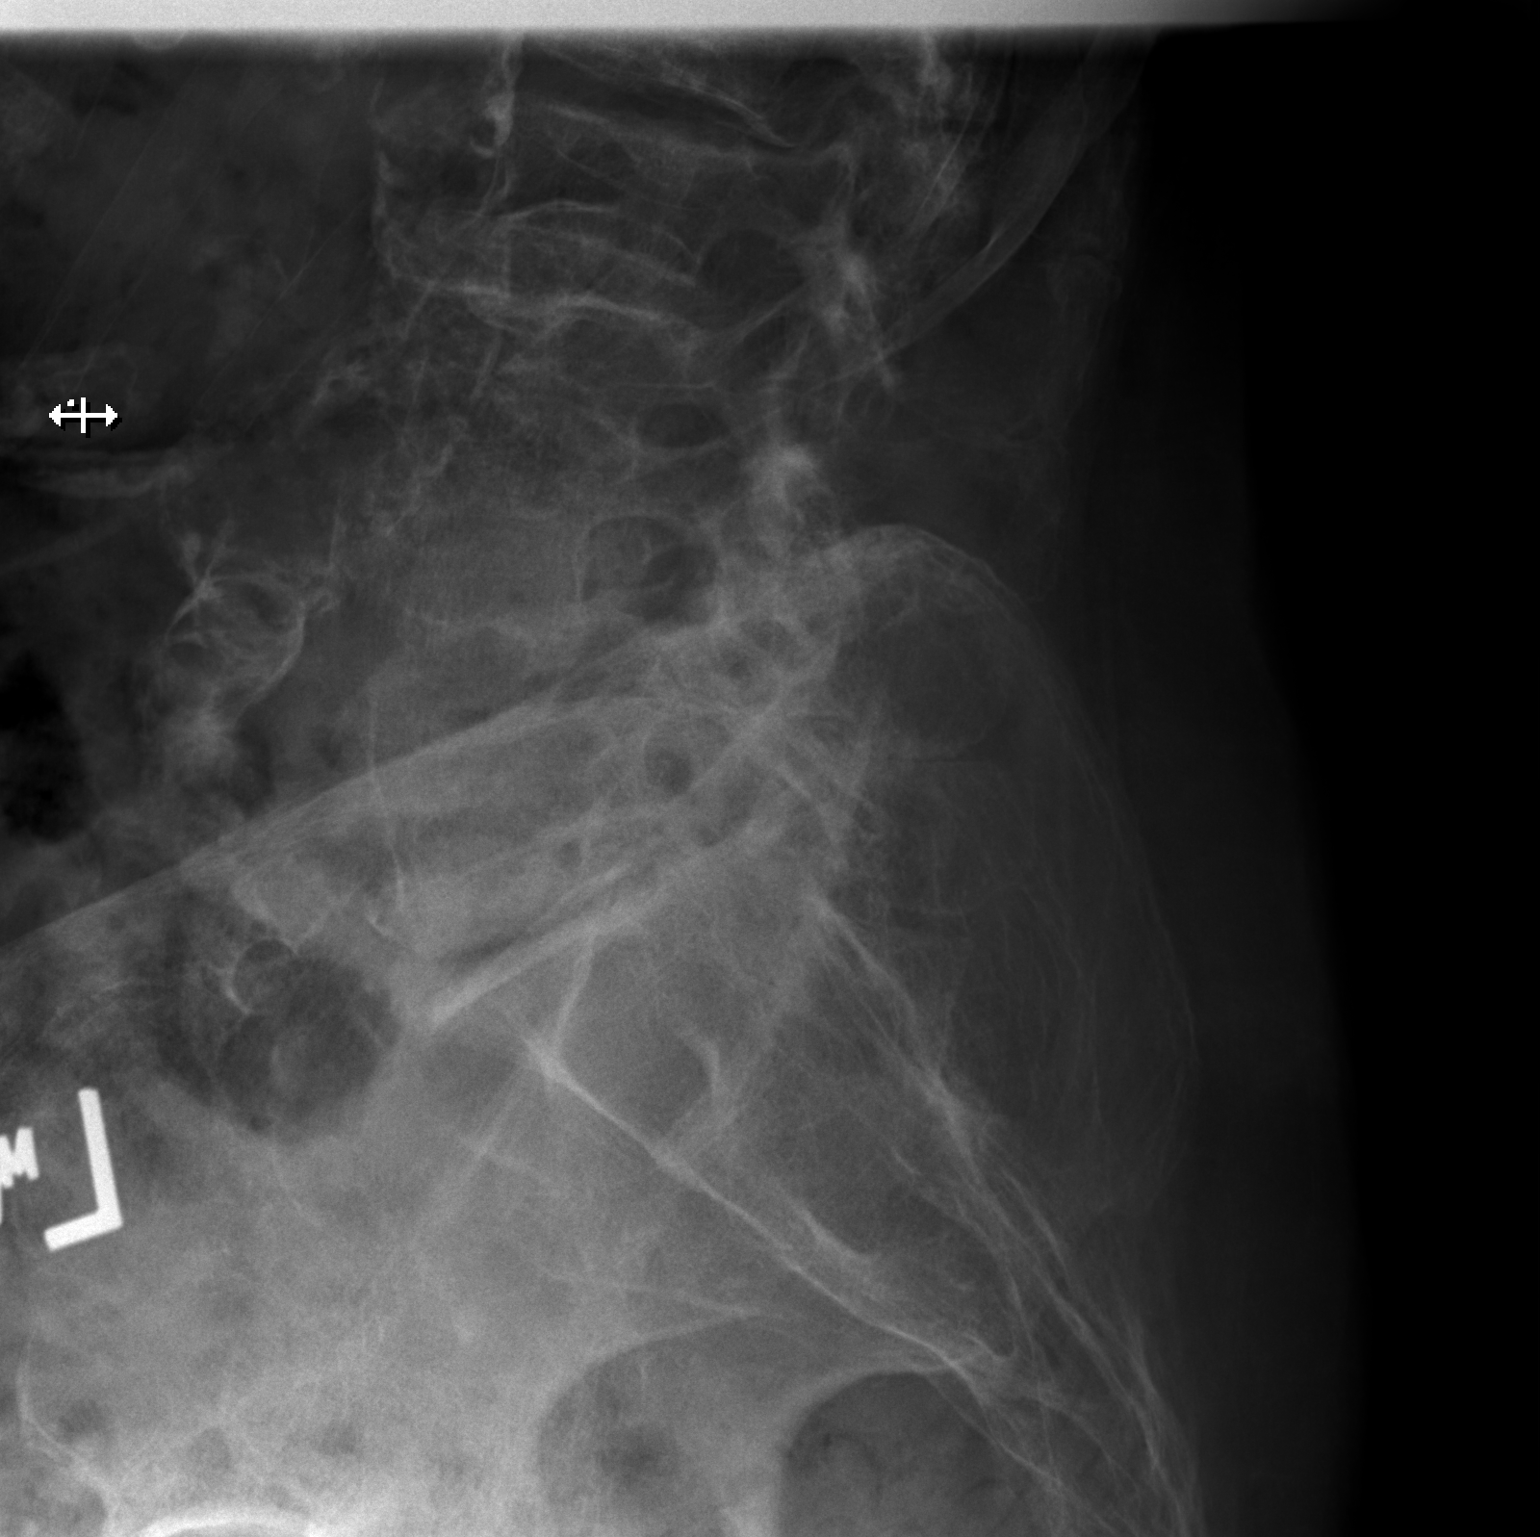

[3 of 3 positions shown; findings below may reference images not displayed]

FINDINGS: Five lumbar vertebrae 5 lumbar vertebrae. The caudal most
well-formed intervertebral disc space is designated L5-S1.

Prominent lumbar levocurvature. L2-L3 grade 1 retrolisthesis. L4-L5
grade 1 anterolisthesis.

No lumbar spine fracture is identified.

Lumbar spondylosis with multilevel disc space narrowing. Most
notably, disc space narrowing is moderate/advanced at L1-L2, L3-L4
and L5-S1. Multilevel degenerative endplate spurring and
degenerative endplate sclerosis. Facet arthrosis, greatest within
the lower lumbar spine.

Aortic atherosclerosis.
IMPRESSION: No radiographic evidence of acute fracture to the lumbar spine. A
lumbar spine CT may be obtained for further evaluation, as
clinically warranted.

Lumbar spondylosis, as described.

Prominent lumbar levocurvature.

L2-L3 grade 1 retrolisthesis.

L4-L5 grade 1 anterolisthesis.

Aortic Atherosclerosis ([id number]).

## 2022-05-11 ENCOUNTER — Non-Acute Institutional Stay: Payer: Medicare Other | Admitting: Internal Medicine

## 2022-05-11 ENCOUNTER — Encounter: Payer: Self-pay | Admitting: Internal Medicine

## 2022-05-11 DIAGNOSIS — I10 Essential (primary) hypertension: Secondary | ICD-10-CM | POA: Diagnosis not present

## 2022-05-11 DIAGNOSIS — I480 Paroxysmal atrial fibrillation: Secondary | ICD-10-CM

## 2022-05-11 DIAGNOSIS — L97919 Non-pressure chronic ulcer of unspecified part of right lower leg with unspecified severity: Secondary | ICD-10-CM

## 2022-05-11 DIAGNOSIS — I2510 Atherosclerotic heart disease of native coronary artery without angina pectoris: Secondary | ICD-10-CM

## 2022-05-11 DIAGNOSIS — I83019 Varicose veins of right lower extremity with ulcer of unspecified site: Secondary | ICD-10-CM

## 2022-05-11 DIAGNOSIS — D509 Iron deficiency anemia, unspecified: Secondary | ICD-10-CM | POA: Diagnosis not present

## 2022-05-11 DIAGNOSIS — I2583 Coronary atherosclerosis due to lipid rich plaque: Secondary | ICD-10-CM

## 2022-05-11 DIAGNOSIS — I83891 Varicose veins of right lower extremities with other complications: Secondary | ICD-10-CM

## 2022-05-11 DIAGNOSIS — I5189 Other ill-defined heart diseases: Secondary | ICD-10-CM

## 2022-05-11 DIAGNOSIS — R600 Localized edema: Secondary | ICD-10-CM | POA: Diagnosis not present

## 2022-05-11 NOTE — Progress Notes (Signed)
Location:   [facility] Room Number: [place] of Service:  SNF [phone]) Provider:  Veleta Miners, MD  [name], MD  Patient Care Team: [name], MD as PCP - General (Internal Medicine) Martinique, Peter M, MD as PCP - Cardiology (Cardiology)  Extended Emergency Contact Information Primary Emergency Contact: [name],Patricia Address: [phone]          [place] [address] [name] Phone: [phone] Mobile Phone: [phone] Relation: Sister Secondary Emergency Contact: [name],[name]  [name] Phone: [phone] Mobile Phone: [phone] Relation: Niece  Code Status:  DNR Goals of care: Advanced Directive information    05/11/2022   10:55 AM  Advanced Directives  Does Patient Have a Medical Advance Directive? Yes  Type of Paramedic of [place];Living will;Out of facility DNR (pink MOST or yellow form)  Does patient want to make changes to medical advance directive? No - Patient declined  Copy of [place] in Chart? Yes - validated most recent copy scanned in chart (See row information)  Pre-existing out of facility DNR order (yellow form or pink MOST form) Pink MOST form placed in chart (order not valid for inpatient use)     Chief Complaint  Patient presents with   Acute Visit    Infected wound    HPI:  Pt is a 88 y.o. female seen today for an acute visit for Non healing Venous stasis ulcer and surrounding cellulitis  [place] lives [place] Has had skin tear in 03/23 Was treated and it almost healed but then it opened again ? Patient was picking on it per nurses Now she has stasis ulcer with necrosis in the middle and redness and tenderness around it No Discharge    She also   has h/o CAD s/p Angioplasty and Stenting in RCA HTN, HLD Moderate Aortic stenosis,  H/o PAF Underwent DCCV No Anticoagulation due to her h/o Falls and Acute  Anemia Chronic Back pain Unstable Gait and Falls Anemia Refused GI work up Dementia  Past Medical History:  Diagnosis Date   Anxiety and depression    Aortic stenosis    a. 03/2014: Mild aortic stenosis by valve area and mean gradient though appeared more visually consistent with moderate AS.    CAD (coronary artery disease)    a. s/p prior PCI of D2 in 1992 b. stent to RCA in 2005   Diverticula of colon    GERD (gastroesophageal reflux disease)    HTN (hypertension)    Hypercholesteremia    Lateral myocardial infarction ([place]) 1992   Osteoarthritis    Osteopenia    Scoliosis    Past Surgical History:  Procedure Laterality Date   ABDOMINAL HYSTERECTOMY     APPENDECTOMY     CORONARY ANGIOPLASTY     TONSILLECTOMY      Allergies  Allergen Reactions   Latex Other (See Comments)    Unknown reaction - listed on [facility] 02/10/22   Nifedipine Other (See Comments)    Dark Urine; can only take orange tablet, allergic to yellow-brown tablet   Tetanus Toxoids Other (See Comments)    Unknown reaction   Tetanus-Diphtheria Toxoids Td Other (See Comments)    Unknown reaction   Adhesive [Tape] Rash    Allergies as of 05/11/2022       Reactions   Latex Other (See Comments)   Unknown reaction - listed on [facility] 02/10/22   Nifedipine Other (See Comments)  Dark Urine; can only take orange tablet, allergic to yellow-brown tablet   Tetanus Toxoids Other (See Comments)   Unknown reaction   Tetanus-diphtheria Toxoids Td Other (See Comments)   Unknown reaction   Adhesive [tape] Rash        Medication List        Accurate as of May 11, 2022 10:56 AM. If you have any questions, ask your nurse or doctor.          acetaminophen 500 MG tablet Commonly known as: TYLENOL Take 1,000 mg by mouth 2 (two) times daily.   amLODipine 5 MG tablet Commonly known as: NORVASC Take 5 mg by mouth daily.   aspirin 81 MG chewable tablet Chew 1 tablet (81 mg total) by mouth at bedtime.    atorvastatin 10 MG tablet Commonly known as: LIPITOR Take 30 mg by mouth at bedtime.   cetirizine 10 MG tablet Commonly known as: ZYRTEC Take 10 mg by mouth every morning.   cholecalciferol 25 MCG (1000 UNIT) tablet Commonly known as: VITAMIN D Take 1,000 Units by mouth every evening.   collagenase 250 UNIT/GM ointment Commonly known as: SANTYL Apply 1 application. topically daily.   ferrous sulfate 325 (65 FE) MG tablet Take 1 tablet (325 mg total) by mouth daily with breakfast.   folic acid 1 MG tablet Commonly known as: FOLVITE Take 1 tablet (1 mg total) by mouth daily.   hydrochlorothiazide 25 MG tablet Commonly known as: HYDRODIURIL Take 25 mg by mouth in the morning.   losartan 100 MG tablet Commonly known as: COZAAR Take 100 mg by mouth at bedtime.   metoprolol succinate 50 MG 24 hr tablet Commonly known as: TOPROL-XL Take 50 mg by mouth every evening. Take with or immediately following a meal.   mupirocin ointment 2 % Commonly known as: BACTROBAN Apply 1 application. topically daily.   nitroGLYCERIN 0.4 MG SL tablet Commonly known as: Nitrostat Place 1 tablet (0.4 mg total) under the tongue every 5 (five) minutes as needed for chest pain.   OLANZapine 2.5 MG tablet Commonly known as: ZYPREXA Take 2.5 mg by mouth at bedtime.   pantoprazole 40 MG tablet Commonly known as: PROTONIX Take 1 tablet (40 mg total) by mouth daily.   senna 8.6 MG Tabs tablet Commonly known as: SENOKOT Take 1 tablet by mouth in the morning.   sertraline 25 MG tablet Commonly known as: ZOLOFT Take 25 mg by mouth at bedtime.        Review of Systems  Constitutional:  Negative for activity change and appetite change.  HENT: Negative.    Respiratory:  Negative for cough and shortness of breath.   Cardiovascular:  Negative for leg swelling.  Gastrointestinal:  Negative for constipation.  Genitourinary: Negative.   Musculoskeletal:  Negative for arthralgias, gait problem  and myalgias.  Skin:  Positive for wound.  Neurological:  Negative for dizziness and weakness.  Psychiatric/Behavioral:  Positive for confusion. Negative for dysphoric mood and sleep disturbance.    Immunization History  Administered Date(s) Administered   Influenza Split 05/13/2009, 05/18/2010, 09/29/2011, 09/05/2012, 09/03/2013   Influenza, High Dose Seasonal PF 09/09/2015, 08/30/2017   Influenza, Quadrivalent, Recombinant, Inj, Pf 08/19/2018, 09/29/2019   Influenza-Unspecified 10/18/2021   Moderna Covid-19 Vaccine Bivalent Booster 37yr & up 09/14/2021   Moderna SARS-COV2 Booster Vaccination 06/01/2021   Moderna Sars-Covid-2 Vaccination 12/29/2019, 01/26/2020   Pneumococcal Conjugate-13 07/08/2013   Pneumococcal Polysaccharide-23 06/29/2006, 05/13/2009, 05/18/2010, 05/25/2011   Td 05/13/2009, 05/18/2010, 05/25/2011   Zoster Recombinat (Shingrix)  08/24/2017, 10/31/2017   Zoster, Live 08/22/2008, 08/24/2008, 05/13/2009, 05/18/2010, 05/25/2011   Pertinent  Health Maintenance Due  Topic Date Due   INFLUENZA VACCINE  07/25/2022   DEXA SCAN  Completed      05/02/2021   10:07 AM 12/14/2021   12:17 PM 02/10/2022   10:00 PM 02/11/2022    8:00 AM 03/20/2022   10:22 AM  Fall Risk  Falls in the past year?     1  Was there an injury with Fall?     0  Fall Risk Category Calculator     1  Fall Risk Category     Low  Patient Fall Risk Level High fall risk Low fall risk Moderate fall risk Moderate fall risk Moderate fall risk  Patient at Risk for Falls Due to     History of fall(s);Impaired balance/gait;Mental status change;Other (Comment)  Patient at Risk for Falls Due to - Comments     vascular dementia  Fall risk Follow up     Falls evaluation completed;Education provided;Falls prevention discussed   Functional Status Survey:    Vitals:   05/11/22 1046  BP: 125/77  Pulse: 68  Temp: (!) 97 F (36.1 C)  SpO2: 91%  Weight: 123 lb 9.6 oz (56.1 kg)  Height: '5\' 5"'$  (1.651 m)   Body  mass index is 20.57 kg/m. Physical Exam Vitals reviewed.  Constitutional:      Appearance: Normal appearance.  HENT:     Head: Normocephalic.     Nose: Nose normal.     Mouth/Throat:     Mouth: Mucous membranes are moist.     Pharynx: Oropharynx is clear.  Eyes:     Pupils: Pupils are equal, round, and reactive to light.  Cardiovascular:     Rate and Rhythm: Normal rate and regular rhythm.     Pulses: Normal pulses.     Heart sounds: Murmur heard.  Pulmonary:     Effort: Pulmonary effort is normal.     Breath sounds: Normal breath sounds.  Abdominal:     General: Abdomen is flat. Bowel sounds are normal.     Palpations: Abdomen is soft.  Musculoskeletal:        General: Swelling present.     Cervical back: Neck supple.  Skin:    General: Skin is warm.     Comments: Small Ulcer in Right LE Center has slough  Redness around it which is tender   Neurological:     General: No focal deficit present.     Mental Status: She is alert.  Psychiatric:        Mood and Affect: Mood normal.        Thought Content: Thought content normal.    Labs reviewed: Recent Labs    02/09/22 0000 02/10/22 1303 02/11/22 0108  NA 137 137 138  K 3.7 3.4* 3.0*  CL 104 104 106  CO2 26* 24 24  GLUCOSE  --  98 110*  BUN '15 16 11  '$ CREATININE 0.6 0.74 0.64  CALCIUM 8.6* 8.5* 8.3*   Recent Labs    11/24/21 0000 02/09/22 0000 02/10/22 1303  AST '16 23 28  '$ ALT '12 22 25  '$ ALKPHOS 106 90 97  BILITOT  --   --  0.5  PROT  --   --  6.3*  ALBUMIN 3.2* 3.7 3.2*   Recent Labs    02/10/22 1303 02/11/22 0108 02/20/22 0000 03/09/22 0000  WBC 4.6 5.8 3.9 3.7  NEUTROABS 2.8 4.1 2,071.00  1,550.00  HGB 6.1* 8.0* 9.1* 10.7*  HCT 21.8* 26.6* 31* 36  MCV 82.0 80.4  --   --   PLT 253 216 291 266   Lab Results  Component Value Date   TSH 2.90 11/24/2021   No results found for: HGBA1C Lab Results  Component Value Date   CHOL 86 01/23/2022   HDL 43 01/23/2022   LDLCALC 27 01/23/2022    TRIG 77 01/23/2022   CHOLHDL 1.9 09/17/2020    Significant Diagnostic Results in last 30 days:  No results found.  Assessment/Plan 1. Venous stasis ulcer of right lower leg with Cellulitis Doxycyline 100 mg BID for 7 days Santyl for center Try to wrap it so she cannot pick on it Wound care consult if it does not heal 2. Iron deficiency anemia, unspecified iron deficiency anemia type On iron Refused GI work up Discontinue Aspirin  3. PAF (paroxysmal atrial fibrillation) (HCC) S/p Cardioversion Not on Anticoagulation due to falls in anemia  4. Grade I diastolic dysfunction Low dose of HCTZ  5. Primary hypertension On Norvasc,Cozaar Toprol and HCTZ  6. Coronary artery disease due to lipid rich plaque On statin and Beta blocker 7 HLD  LDL 27   Moderate AS Asymptomatic     Depression, recurrent (HCC) On Zoloft Moderate vascular dementia with other behavioral disturbance CT scan in 05/22 showed Chronic Vascular changes MMSE here is 16/30 Missed Recall and Orientation questions Failed Clock on Zyprexa 2.5 mg QHS to help with her sundowning Zoloft   Family/ staff Communication:   Labs/tests ordered:   LIPId,CBC,BMP

## 2022-05-26 ENCOUNTER — Non-Acute Institutional Stay: Payer: Medicare Other | Admitting: Adult Health

## 2022-05-26 ENCOUNTER — Encounter: Payer: Self-pay | Admitting: Adult Health

## 2022-05-26 DIAGNOSIS — I2583 Coronary atherosclerosis due to lipid rich plaque: Secondary | ICD-10-CM | POA: Diagnosis not present

## 2022-05-26 DIAGNOSIS — I2510 Atherosclerotic heart disease of native coronary artery without angina pectoris: Secondary | ICD-10-CM

## 2022-05-26 DIAGNOSIS — R2681 Unsteadiness on feet: Secondary | ICD-10-CM

## 2022-05-26 DIAGNOSIS — I10 Essential (primary) hypertension: Secondary | ICD-10-CM

## 2022-05-26 NOTE — Progress Notes (Signed)
Location:  [place] Room Number: [place] of Service:  [id number] [phone]) Provider: Durenda Age, NP   Patient Care Team: [name], MD as PCP - General (Internal Medicine) Martinique, Peter M, MD as PCP - Cardiology (Cardiology)  Extended Emergency Contact Information Primary Emergency Contact: [name],Patricia Address: [phone] [place]          [place] [address] [name] of [place] Phone: [phone] Mobile Phone: [phone] Relation: Sister Secondary Emergency Contact: [name],[name]  [name] of [place] Phone: [phone] Mobile Phone: [phone] Relation: Niece  Code Status:  DNR Goals of care: Advanced Directive information    05/26/2022   10:56 AM  Advanced Directives  Does Patient Have a Medical Advance Directive? Yes  Type of Paramedic of [place];Living will;Out of facility DNR (pink MOST or yellow form)  Does patient want to make changes to medical advance directive? No - Patient declined  Copy of [place] in Chart? Yes - validated most recent copy scanned in chart (See row information)  Pre-existing out of facility DNR order (yellow form or pink MOST form) Pink MOST form placed in chart (order not valid for inpatient use)     Chief Complaint  Patient presents with   Acute Visit    Unsteady Right knee    HPI:  [name] is a 88 y.o. female seen today for an acute visit for spasm on right knee. She was seen in the room today. She is currently having [name] and was reported to had an unsteady episode due to her right knee weakness. She denies pain. She takes Amlodipine 5 mg daily and Cozaar 100 mg at bedtime for hypertension. Latest BP 120/65. She denies headache nor dizziness.   Past Medical History:  Diagnosis Date   Anxiety and depression    Aortic stenosis    a. 03/2014: Mild aortic stenosis by valve area and mean gradient though appeared more visually consistent with  moderate AS.    CAD (coronary artery disease)    a. s/p prior PCI of D2 in 1992 b. stent to RCA in 2005   Diverticula of colon    GERD (gastroesophageal reflux disease)    HTN (hypertension)    Hypercholesteremia    Lateral myocardial infarction ([place]) 1992   Osteoarthritis    Osteopenia    Scoliosis    Past Surgical History:  Procedure Laterality Date   ABDOMINAL HYSTERECTOMY     APPENDECTOMY     CORONARY ANGIOPLASTY     TONSILLECTOMY      Allergies  Allergen Reactions   Latex Other (See Comments)    Unknown reaction - listed on [facility] 02/10/22   Nifedipine Other (See Comments)    Dark Urine; can only take orange tablet, allergic to yellow-brown tablet   Tetanus Toxoids Other (See Comments)    Unknown reaction   Tetanus-Diphtheria Toxoids Td Other (See Comments)    Unknown reaction   Adhesive [Tape] Rash    Outpatient Encounter Medications as of 05/26/2022  Medication Sig   acetaminophen (TYLENOL) 500 MG tablet Take 1,000 mg by mouth 2 (two) times daily.   amLODipine (NORVASC) 5 MG tablet Take 5 mg by mouth daily.   atorvastatin (LIPITOR) 10 MG tablet Take 10 mg by mouth at bedtime.   cetirizine (ZYRTEC) 10 MG tablet Take 10 mg by mouth every morning.   cholecalciferol (VITAMIN D) 25 MCG (1000 UNIT) tablet Take 1,000 Units by mouth every evening.   collagenase (SANTYL)  250 UNIT/GM ointment Apply 1 application. topically daily. Apply to the RLE open wound bed-yellow slough daily   ferrous sulfate 325 (65 FE) MG tablet Take 1 tablet (325 mg total) by mouth daily with breakfast.   folic acid (FOLVITE) 1 MG tablet Take 1 tablet (1 mg total) by mouth daily.   hydrochlorothiazide (HYDRODIURIL) 25 MG tablet Take 25 mg by mouth in the morning.   losartan (COZAAR) 100 MG tablet Take 100 mg by mouth at bedtime.   metoprolol succinate (TOPROL-XL) 50 MG 24 hr tablet Take 50 mg by mouth every evening. Take with or immediately following a meal.   nitroGLYCERIN (NITROSTAT) 0.4 MG SL tablet  Place 1 tablet (0.4 mg total) under the tongue every 5 (five) minutes as needed for chest pain.   OLANZapine (ZYPREXA) 2.5 MG tablet Take 2.5 mg by mouth at bedtime.   pantoprazole (PROTONIX) 40 MG tablet Take 1 tablet (40 mg total) by mouth daily.   potassium chloride (KLOR-CON M) 10 MEQ tablet Take 10 mEq by mouth daily.   senna (SENOKOT) 8.6 MG TABS tablet Take 1 tablet by mouth in the morning.   sertraline (ZOLOFT) 25 MG tablet Take 25 mg by mouth at bedtime.   [DISCONTINUED] aspirin 81 MG chewable tablet Chew 1 tablet (81 mg total) by mouth at bedtime. (Patient not taking: Reported on 05/26/2022)   [DISCONTINUED] mupirocin ointment (BACTROBAN) 2 % Apply 1 application. topically daily. (Patient not taking: Reported on 05/26/2022)   No facility-administered encounter medications on file as of 05/26/2022.    Review of Systems  Constitutional:  Negative for appetite change, chills, fatigue and fever.  HENT:  Negative for congestion, hearing loss, rhinorrhea and sore throat.   Eyes: Negative.   Respiratory:  Negative for cough, shortness of breath and wheezing.   Cardiovascular:  Negative for chest pain, palpitations and leg swelling.  Gastrointestinal:  Negative for abdominal pain, constipation, diarrhea, nausea and vomiting.  Genitourinary:  Negative for dysuria.  Musculoskeletal:  Positive for gait problem. Negative for arthralgias, back pain and myalgias.  Skin:  Negative for color change, rash and wound.  Neurological:  Negative for dizziness, weakness and headaches.  Psychiatric/Behavioral:  Negative for behavioral problems. The patient is not nervous/anxious.     Immunization History  Administered Date(s) Administered   Influenza Split 05/13/2009, 05/18/2010, 09/29/2011, 09/05/2012, 09/03/2013   Influenza, High Dose Seasonal PF 09/09/2015, 08/30/2017   Influenza, Quadrivalent, Recombinant, Inj, Pf 08/19/2018, 09/29/2019   Influenza-Unspecified 10/18/2021   Moderna Covid-19 Vaccine  Bivalent Booster 71yr & up 09/14/2021   Moderna SARS-COV2 Booster Vaccination 06/01/2021   Moderna Sars-Covid-2 Vaccination 12/29/2019, 01/26/2020   Pneumococcal Conjugate-13 07/08/2013   Pneumococcal Polysaccharide-23 06/29/2006, 05/13/2009, 05/18/2010, 05/25/2011   Td 05/13/2009, 05/18/2010, 05/25/2011   Zoster Recombinat (Shingrix) 08/24/2017, 10/31/2017   Zoster, Live 08/22/2008, 08/24/2008, 05/13/2009, 05/18/2010, 05/25/2011   Pertinent  Health Maintenance Due  Topic Date Due   INFLUENZA VACCINE  07/25/2022   DEXA SCAN  Completed      05/02/2021   10:07 AM 12/14/2021   12:17 PM 02/10/2022   10:00 PM 02/11/2022    8:00 AM 03/20/2022   10:22 AM  Fall Risk  Falls in the past year?     1  Was there an injury with Fall?     0  Fall Risk Category Calculator     1  Fall Risk Category     Low  Patient Fall Risk Level High fall risk Low fall risk Moderate fall risk Moderate fall  risk Moderate fall risk  Patient at Risk for Falls Due to     History of fall(s);Impaired balance/gait;Mental status change;Other (Comment)  Patient at Risk for Falls Due to - Comments     vascular dementia  Fall risk Follow up     Falls evaluation completed;Education provided;Falls prevention discussed   Functional Status Survey:    Vitals:   05/26/22 1048  BP: 120/65  Pulse: 62  Resp: 18  Temp: (!) 97 F (36.1 C)  SpO2: 94%  Weight: 122 lb 3.2 oz (55.4 kg)  Height: '5\' 5"'$  (1.651 m)   Body mass index is 20.34 kg/m. Physical Exam Constitutional:      General: She is not in acute distress.    Appearance: Normal appearance.  HENT:     Head: Normocephalic and atraumatic.     Nose: Nose normal.     Mouth/Throat:     Mouth: Mucous membranes are moist.  Eyes:     Conjunctiva/sclera: Conjunctivae normal.  Cardiovascular:     Rate and Rhythm: Normal rate and regular rhythm.  Pulmonary:     Effort: Pulmonary effort is normal.     Breath sounds: Normal breath sounds.  Abdominal:     General: Bowel  sounds are normal.     Palpations: Abdomen is soft.  Musculoskeletal:     Cervical back: Normal range of motion.     Right lower leg: No edema.     Comments: RLE trace edema  Skin:    General: Skin is warm and dry.  Neurological:     Mental Status: She is alert. Mental status is at baseline. She is disoriented.     Comments: Alert to self and place, disoriented to time.  Psychiatric:        Mood and Affect: Mood normal.        Behavior: Behavior normal.     Labs reviewed: Recent Labs    02/09/22 0000 02/10/22 1303 02/11/22 0108  NA 137 137 138  K 3.7 3.4* 3.0*  CL 104 104 106  CO2 26* 24 24  GLUCOSE  --  98 110*  BUN '15 16 11  '$ CREATININE 0.6 0.74 0.64  CALCIUM 8.6* 8.5* 8.3*   Recent Labs    11/24/21 0000 02/09/22 0000 02/10/22 1303  AST '16 23 28  '$ ALT '12 22 25  '$ ALKPHOS 106 90 97  BILITOT  --   --  0.5  PROT  --   --  6.3*  ALBUMIN 3.2* 3.7 3.2*   Recent Labs    02/10/22 1303 02/11/22 0108 02/20/22 0000 03/09/22 0000  WBC 4.6 5.8 3.9 3.7  NEUTROABS 2.8 4.1 2,071.00 1,550.00  HGB 6.1* 8.0* 9.1* 10.7*  HCT 21.8* 26.6* 31* 36  MCV 82.0 80.4  --   --   PLT 253 216 291 266   Lab Results  Component Value Date   TSH 2.90 11/24/2021   No results found for: "HGBA1C" Lab Results  Component Value Date   CHOL 86 01/23/2022   HDL 43 01/23/2022   LDLCALC 27 01/23/2022   TRIG 77 01/23/2022   CHOLHDL 1.9 09/17/2020    Significant Diagnostic Results in last 30 days:  No results found.  Assessment/Plan  1. Gait instability -   continue [name] for knee stability and possible knee bracing to provide stability -  fall precautions  2. Primary hypertension -Blood pressure stable Continue current medications    Family/ staff Communication:  Discussed plan of care with resident, charge nurse and  [name].  Labs/tests ordered:  x-ray of right hip, thigh and knee

## 2022-05-29 NOTE — Progress Notes (Signed)
Cardiology Office Note    Date:  06/01/2022   ID:  [name], DOB Feb 18, 1929, MRN [id number]  PCP:  Erin Dad, MD  Cardiologist:  Rashed Edler Martinique, MD    History of Present Illness:  [name] is a 88 y.o. female who has a history of coronary disease and is s/p prior angioplasty of the diagonal branch in 1992. She had stenting of the right coronary in 2005. She has a history of hypertension. She has a history of mild-moderate aortic stenosis by Echo in 2015. Repeat in October 2019 was unchanged. She has chronic back pain related to scoliosis.  She is now living at [facility] now in assisted living.  She was seen by me on 09/14/20 and was doing OK. She was admitted shortly after from 09/20/2020 to 09/22/2020 for atrial fibrillation after presenting with chest pain and shortness of breath but no palpitations. Rates were as high as the 190's. She was cardioverted in the field by EMS with restoration of sinus rhythm. Chest pain resolved with restoration of sinus rhythm. In the ED, she was found to have acute anemia with hemoglobin of 7.7 (down from 9.1 on 09/17/2020 and 13.8 in 09/2019). High-sensitivity troponin peaked at 603 but this was felt to due to demand ischemia from atrial fibrillation with RVR and anemia.  Echo showed LVEF of 65-70% with normal wall motion, grade 1 diastolic dysfunction, severely dilated left atrium, moderate aortic stenosis, mild mitral regurgitation. In regards to anemia work-up, hemoccult was negative but iron and ferritin were low consistent with iron deficiency anemia. She was transfused but did develop some itching post-transfusion. She also received one dose of IV iron and then started on PO regimen. Patient was not interested in inpatient endoscopic evaluation given no hematochezia or melena. Hemoglobin improved to 10.2 on day of discharge. CHA2DS2-VASc = 5 (CAD, HTN, age x2, female). However, given acute anemia, anticoagulation was not started. There was also  concern that she may be at increased risk of anticoagulation due to confusion demonstrated during admission and increased risk of falls.  Seen in ED on April 30 with mechanical fall. Fractured 3 ribs. Also had mechanical falls in April and May without falls. She has moved to assisted living.   She was admitted in February 2023 with severe anemia and GI bleed. Stool heme positive. Hgb 6.1. refused further evaluation. Was transfused 2 units PRBCs and DC home. Has advanced dementia.   On follow up today she is seen with her sister. She reports she is doing well. No further falls. Is not aware of any heart racing, palpitations, SOB, or chest pain.    Past Medical History:  Diagnosis Date   Anxiety and depression    Aortic stenosis    a. 03/2014: Mild aortic stenosis by valve area and mean gradient though appeared more visually consistent with moderate AS.    CAD (coronary artery disease)    a. s/p prior PCI of D2 in 1992 b. stent to RCA in 2005   Diverticula of colon    GERD (gastroesophageal reflux disease)    HTN (hypertension)    Hypercholesteremia    Lateral myocardial infarction [facility]) 1992   Osteoarthritis    Osteopenia    Scoliosis     Past Surgical History:  Procedure Laterality Date   ABDOMINAL HYSTERECTOMY     APPENDECTOMY     CORONARY ANGIOPLASTY     TONSILLECTOMY      Current Medications: Outpatient Medications Prior to Visit  Medication Sig Dispense Refill   acetaminophen (TYLENOL) 500 MG tablet Take 1,000 mg by mouth 2 (two) times daily.     amLODipine (NORVASC) 5 MG tablet Take 5 mg by mouth daily.     aspirin 81 MG chewable tablet Chew by mouth daily.     atorvastatin (LIPITOR) 10 MG tablet Take 10 mg by mouth at bedtime.     atorvastatin (LIPITOR) 20 MG tablet Take 20 mg by mouth daily.     cetirizine (ZYRTEC) 10 MG tablet Take 10 mg by mouth every morning.     cholecalciferol (VITAMIN D) 25 MCG (1000 UNIT) tablet Take 1,000 Units by mouth every evening.      collagenase (SANTYL) 250 UNIT/GM ointment Apply 1 application. topically daily. Apply to the RLE open wound bed-yellow slough daily     doxycycline (VIBRA-TABS) 100 MG tablet Take 100 mg by mouth 2 (two) times daily.     ferrous sulfate 325 (65 FE) MG tablet Take 1 tablet (325 mg total) by mouth daily with breakfast. 30 tablet 0   folic acid (FOLVITE) 1 MG tablet Take 1 tablet (1 mg total) by mouth daily. 30 tablet 0   hydrochlorothiazide (HYDRODIURIL) 25 MG tablet Take 25 mg by mouth in the morning.     losartan (COZAAR) 100 MG tablet Take 100 mg by mouth at bedtime.     metoprolol succinate (TOPROL-XL) 50 MG 24 hr tablet Take 50 mg by mouth every evening. Take with or immediately following a meal.     mupirocin ointment (BACTROBAN) 2 % 1 application. 2 (two) times daily.     nitroGLYCERIN (NITROSTAT) 0.4 MG SL tablet Place 1 tablet (0.4 mg total) under the tongue every 5 (five) minutes as needed for chest pain. 25 tablet 11   OLANZapine (ZYPREXA) 2.5 MG tablet Take 2.5 mg by mouth at bedtime.     pantoprazole (PROTONIX) 40 MG tablet Take 1 tablet (40 mg total) by mouth daily. 30 tablet 0   potassium chloride (KLOR-CON M) 10 MEQ tablet Take 10 mEq by mouth daily.     senna (SENOKOT) 8.6 MG TABS tablet Take 1 tablet by mouth in the morning.     sertraline (ZOLOFT) 25 MG tablet Take 25 mg by mouth at bedtime.     No facility-administered medications prior to visit.     Allergies:   Latex, Nifedipine, Tetanus toxoids, Tetanus-diphtheria toxoids td, and Adhesive [tape]   Social History   Socioeconomic History   Marital status: Divorced    Spouse name: Not on file   Number of children: 1   Years of education: Not on file   Highest education level: Not on file  Occupational History   Occupation: retired  Tobacco Use   Smoking status: Former    Types: Cigarettes    Quit date: 12/26/1991    Years since quitting: 30.4   Smokeless tobacco: Never  Vaping Use   Vaping Use: Never used   Substance and Sexual Activity   Alcohol use: No    Alcohol/week: 0.0 standard drinks of alcohol   Drug use: Never   Sexual activity: Not on file  Other Topics Concern   Not on file  Social History Narrative   Not on file   Social Determinants of Health   Financial Resource Strain: Low Risk  (03/20/2022)   Overall Financial Resource Strain (CARDIA)    Difficulty of Paying Living Expenses: Not hard at all  Food Insecurity: No Food Insecurity (03/20/2022)   Hunger Vital  Sign    Worried About Charity fundraiser in the Last Year: Never true    [place] in the Last Year: Never true  Transportation Needs: No Transportation Needs (03/20/2022)   PRAPARE - Hydrologist (Medical): No    Lack of Transportation (Non-Medical): No  Physical Activity: Insufficiently Active (03/20/2022)   Exercise Vital Sign    Days of Exercise per Week: 5 days    Minutes of Exercise per Session: 20 min  Stress: No Stress Concern Present (03/20/2022)   [place]    Feeling of Stress : Not at all  Social Connections: Socially Isolated (03/20/2022)   Social Connection and Isolation Panel [NHANES]    Frequency of Communication with Friends and Family: Twice a week    Frequency of Social Gatherings with Friends and Family: Twice a week    Attends Religious Services: Never    Marine scientist or Organizations: No    Attends Music therapist: Never    Marital Status: Divorced     Family History:  The patient's family history includes Cancer in her brother; Heart failure in her mother; Hypertension in her father and mother.   ROS:   Please see the history of present illness.    ROS All other systems reviewed and are negative.   PHYSICAL EXAM:   VS:  BP 128/70   Pulse (!) 54   Ht 5' (1.524 m)   Wt 122 lb 9.6 oz (55.6 kg)   SpO2 95%   BMI 23.94 kg/m    GENERAL:  Well appearing, elderly WF  in NAD HEENT:  PERRL, EOMI, sclera are clear. Oropharynx is clear. NECK:  No jugular venous distention, carotid upstroke brisk and symmetric, no bruits, no thyromegaly or adenopathy LUNGS:  Clear to auscultation bilaterally CHEST:  Unremarkable HEART:  RRR,  PMI not displaced or sustained,S1 and S2 within normal limits, no S3, no S4: no clicks, no rubs, gr 3-1/5 harsh systolic murmur RUSB radiates to carotids. EXT:  2 + pulses throughout, no edema, no cyanosis no clubbing SKIN:  Warm and dry.  No rashes NEURO:  Alert and oriented x 3. Cranial nerves II through XII intact. PSYCH:  Cognitively intact   Wt Readings from Last 3 Encounters:  06/01/22 122 lb 9.6 oz (55.6 kg)  05/26/22 122 lb 3.2 oz (55.4 kg)  05/11/22 123 lb 9.6 oz (56.1 kg)      Studies/Labs Reviewed:    Recent Labs: 11/24/2021: TSH 2.90 02/10/2022: ALT 25 02/11/2022: BUN 11; Creatinine, Ser 0.64; Potassium 3.0; Sodium 138 03/09/2022: Hemoglobin 10.7; Platelets 266   Lipid Panel    Component Value Date/Time   CHOL 86 01/23/2022 0000   CHOL 138 09/17/2020 1559   TRIG 77 01/23/2022 0000   HDL 43 01/23/2022 0000   HDL 73 09/17/2020 1559   CHOLHDL 1.9 09/17/2020 1559   CHOLHDL 2 08/22/2013 0946   VLDL 24.0 08/22/2013 0946   LDLCALC 27 01/23/2022 0000   LDLCALC 51 09/17/2020 1559    Additional studies/ records that were reviewed today include:  Labs from 07/28/16: cholesterol 156, triglycerides 42, LDL 83, HDL 65. CMET, TSH, CBC all normal.  Dated 08/06/17: cholesterol 145, triglycerides 67, HDL 59, LDL 73, CMET, CBC normal. TSH normal.  Dated 08/12/18: cholesterol 150, triglycerides 72, HDL 65, LDL 71. Chemistries, Hgb, and TSH normal.   Echo 09/30/18: Study Conclusions   - Left ventricle:  The cavity size was normal. Wall thickness was   increased in a pattern of moderate LVH. There was focal basal   hypertrophy. Systolic function was normal. The estimated ejection   fraction was in the range of 60% to 65%. Wall  motion was normal;   there were no regional wall motion abnormalities. - Aortic valve: Moderately calcified annulus. Moderately thickened,   moderately calcified leaflets. There was mild to moderate   stenosis. Valve area (VTI): 1.39 cm^2. Valve area (Vmax): 1.29   cm^2. Valve area (Vmean): 1.12 cm^2. - Pulmonary arteries: PA peak pressure: 31 mm Hg (S).  Echocardiogram 09/21/2020: Impressions: 1. Left ventricular ejection fraction, by estimation, is 65 to 70%. The  left ventricle has normal function. The left ventricle has no regional  wall motion abnormalities. There is mild left ventricular hypertrophy.  Left ventricular diastolic parameters  are consistent with Grade I diastolic dysfunction (impaired relaxation).  Elevated left ventricular end-diastolic pressure. The E/e' is 55.   2. Right ventricular systolic function is normal. The right ventricular  size is normal. There is mildly elevated pulmonary artery systolic  pressure. The estimated right ventricular systolic pressure is 08.6 mmHg.   3. Left atrial size was severely dilated.   4. Right atrial size was mildly dilated.   5. The mitral valve is abnormal. Mild mitral valve regurgitation.   6. The aortic valve is tricuspid. Aortic valve regurgitation is not  visualized. Moderate aortic valve stenosis. Aortic valve area, by VTI  measures 1.07 cm. Aortic valve mean gradient measures 20.0 mmHg. Aortic  valve Vmax measures 3.02 m/s. Peak  gradient 37 mmHg. DI 0.34   7. The inferior vena cava is normal in size with <50% respiratory  variability, suggesting right atrial pressure of 8 mmHg.   Comparison(s): Prior images unable to be directly viewed, comparison made by report only. Changes from prior study are noted. 09/30/2018: LVEF 60-65%, mild to moderate AS -mean/peak gradient 13/26 mmHg.      ASSESSMENT:    1. Coronary artery disease involving native coronary artery of native heart without angina pectoris   2. Paroxysmal  atrial fibrillation (HCC)   3. Aortic valve stenosis, etiology of cardiac valve disease unspecified        PLAN:  In order of problems listed above:  1. Paroxysmal Atrial Fibrillation - admitted in September 2021 with atrial fibrillation with RVR but converted back to normal sinus rhythm with DCCV. - Maintaining sinus rhythm.  - Continue Toprol-XL '50mg'$  daily. - CHA2DS2-VASc = 5 (CAD, HTN, age x2, female). However, patient was not started on anticoagulation given acute anemia and risk for falls. Since no recurrent Afib to date. Would not anticoagulate   2. CAD Demand Ischemia - History of prior stenting to D2 in 1992 and RCA in 2005. - No recurrent chest pain.  - Continue aspirin, beta-blocker, and statin.  3. Moderate Aortic Stenosis - Echo on 09/21/2020 showed LVEF of 65-70% with normal wall motion and moderate AS. - not a candidate for TAVR given significant dementia.  - no further evaluation    4. Hypertension - BP is well controlled on current therapy   6.Hyperlipidemia - Continue Lipitor '30mg'$  daily.    7. Iron Deficiency Anemia - due to GI bleed. Follow up per PCP   Follow up in one year   Signed, Desteni Piscopo Martinique, MD  06/01/2022 2:40 PM    [facility] [address], [place], [place], [address] [phone]

## 2022-06-01 ENCOUNTER — Ambulatory Visit (INDEPENDENT_AMBULATORY_CARE_PROVIDER_SITE_OTHER): Payer: Medicare Other | Admitting: Cardiology

## 2022-06-01 ENCOUNTER — Encounter: Payer: Self-pay | Admitting: Cardiology

## 2022-06-01 VITALS — BP 128/70 | HR 54 | Ht 60.0 in | Wt 122.6 lb

## 2022-06-01 DIAGNOSIS — I2510 Atherosclerotic heart disease of native coronary artery without angina pectoris: Secondary | ICD-10-CM | POA: Diagnosis not present

## 2022-06-01 DIAGNOSIS — I350 Nonrheumatic aortic (valve) stenosis: Secondary | ICD-10-CM

## 2022-06-01 DIAGNOSIS — I2583 Coronary atherosclerosis due to lipid rich plaque: Secondary | ICD-10-CM | POA: Diagnosis not present

## 2022-06-01 DIAGNOSIS — I480 Paroxysmal atrial fibrillation: Secondary | ICD-10-CM | POA: Diagnosis not present

## 2022-06-01 NOTE — Patient Instructions (Signed)
Medication Instructions:  Your physician recommends that you continue on your current medications as directed. Please refer to the Current Medication list given to you today.  *If you need a refill on your cardiac medications before your next appointment, please call your pharmacy*  Follow-Up: At [facility], you and your health needs are our priority.  As part of our continuing mission to provide you with exceptional heart care, we have created designated Provider Care Teams.  These Care Teams include your primary Cardiologist (physician) and Advanced Practice Providers (APPs -  Physician Assistants and Nurse Practitioners) who all work together to provide you with the care you need, when you need it.  We recommend signing up for the patient portal called "MyChart".  Sign up information is provided on this After Visit Summary.  MyChart is used to connect with patients for Virtual Visits (Telemedicine).  Patients are able to view lab/test results, encounter notes, upcoming appointments, etc.  Non-urgent messages can be sent to your provider as well.   To learn more about what you can do with MyChart, go to [email or web address].    Your next appointment:   12 month(s)  The format for your next appointment:   In Person  Provider:   [name], MD {    Important Information About Sugar

## 2022-06-05 DIAGNOSIS — D649 Anemia, unspecified: Secondary | ICD-10-CM | POA: Diagnosis not present

## 2022-06-05 DIAGNOSIS — I10 Essential (primary) hypertension: Secondary | ICD-10-CM | POA: Diagnosis not present

## 2022-06-05 LAB — BASIC METABOLIC PANEL
BUN: 16 (ref 4–21)
CO2: 29 — AB (ref 13–22)
Chloride: 107 (ref 99–108)
Creatinine: 0.5 (ref 0.5–1.1)
Glucose: 85
Potassium: 3.9 mEq/L (ref 3.5–5.1)
Sodium: 142 (ref 137–147)

## 2022-06-05 LAB — CBC AND DIFFERENTIAL
HCT: 39 (ref 36–46)
Hemoglobin: 12.5 (ref 12.0–16.0)
Platelets: 209 10*3/uL (ref 150–400)
WBC: 4.2

## 2022-06-05 LAB — COMPREHENSIVE METABOLIC PANEL
Calcium: 8.7 (ref 8.7–10.7)
eGFR: 88

## 2022-06-05 LAB — CBC: RBC: 4.23 (ref 3.87–5.11)

## 2022-06-07 DIAGNOSIS — Z23 Encounter for immunization: Secondary | ICD-10-CM | POA: Diagnosis not present

## 2022-06-23 ENCOUNTER — Non-Acute Institutional Stay: Payer: Medicare Other | Admitting: Orthopedic Surgery

## 2022-06-23 ENCOUNTER — Encounter: Payer: Self-pay | Admitting: Orthopedic Surgery

## 2022-06-23 DIAGNOSIS — F32A Depression, unspecified: Secondary | ICD-10-CM

## 2022-06-23 DIAGNOSIS — D649 Anemia, unspecified: Secondary | ICD-10-CM

## 2022-06-23 DIAGNOSIS — R2681 Unsteadiness on feet: Secondary | ICD-10-CM

## 2022-06-23 DIAGNOSIS — I5189 Other ill-defined heart diseases: Secondary | ICD-10-CM

## 2022-06-23 DIAGNOSIS — I83019 Varicose veins of right lower extremity with ulcer of unspecified site: Secondary | ICD-10-CM

## 2022-06-23 DIAGNOSIS — I480 Paroxysmal atrial fibrillation: Secondary | ICD-10-CM | POA: Diagnosis not present

## 2022-06-23 DIAGNOSIS — F01518 Vascular dementia, unspecified severity, with other behavioral disturbance: Secondary | ICD-10-CM | POA: Diagnosis not present

## 2022-06-23 DIAGNOSIS — I2510 Atherosclerotic heart disease of native coronary artery without angina pectoris: Secondary | ICD-10-CM | POA: Diagnosis not present

## 2022-06-23 DIAGNOSIS — I10 Essential (primary) hypertension: Secondary | ICD-10-CM

## 2022-06-23 DIAGNOSIS — I2583 Coronary atherosclerosis due to lipid rich plaque: Secondary | ICD-10-CM

## 2022-06-23 DIAGNOSIS — F419 Anxiety disorder, unspecified: Secondary | ICD-10-CM

## 2022-06-23 DIAGNOSIS — E7800 Pure hypercholesterolemia, unspecified: Secondary | ICD-10-CM | POA: Diagnosis not present

## 2022-06-23 DIAGNOSIS — I83891 Varicose veins of right lower extremities with other complications: Secondary | ICD-10-CM | POA: Diagnosis not present

## 2022-06-23 DIAGNOSIS — L97919 Non-pressure chronic ulcer of unspecified part of right lower leg with unspecified severity: Secondary | ICD-10-CM

## 2022-06-23 DIAGNOSIS — R600 Localized edema: Secondary | ICD-10-CM

## 2022-06-23 MED ORDER — DOXYCYCLINE HYCLATE 100 MG PO TABS: 100.0000 mg | ORAL_TABLET | Freq: Two times a day (BID) | ORAL | 0 refills | Status: AC

## 2022-06-23 NOTE — Progress Notes (Signed)
Location:  [facility] Room Number: [place] of Service:  ALF [id number]) Provider: [name], NP   Patient Care Team: [name], MD as PCP - General (Internal Medicine) Martinique, Peter M, MD as PCP - Cardiology (Cardiology)  Extended Emergency Contact Information Primary Emergency Contact: [name],Patricia Address: [phone] [place]          [place] [address] [name] of [place] Phone: [phone] Mobile Phone: [phone] Relation: Sister Secondary Emergency Contact: [name],[name]  [name] of [place] Phone: [phone] Mobile Phone: [phone] Relation: Niece  Code Status:  DNR Goals of care: Advanced Directive information    06/23/2022   10:04 AM  Advanced Directives  Does Patient Have a Medical Advance Directive? Yes  Type of Paramedic of [place];Living will;Out of facility DNR (pink MOST or yellow form)  Does patient want to make changes to medical advance directive? No - Patient declined  Copy of [place] in Chart? Yes - validated most recent copy scanned in chart (See row information)  Pre-existing out of facility DNR order (yellow form or pink MOST form) Pink MOST form placed in chart (order not valid for inpatient use)     Chief Complaint  Patient presents with   Medical Management of Chronic Issues    Routine visit.   Quality Metric Gaps    Discuss the need for TDAP and additional Covid vaccine, or post pone if patient refuses.     HPI:  Pt is a 88 y.o. female seen today for medical management of chronic diseases.    She currently resides on the assisted living unit at [facility]. PMH: aortic stenosis,CAD, HTN, HLD, atrial fib, emphysema, GERD, vascular dementia, and depression.    Venous stasis ulcer- onset 03/03, wound slow healing, treated with doxycycline and mupirocin,she continue to pull off bandage and pick at wound, purulent drainage and increased  tenderness noticed today Symptomatic anemia- ED visit 02/17-02/18- hgb 6.1- given 2 units PRBC/ + hemoccult, hgb 9.1 02/16/2022- now 12.5 06/05/2022, denies black tarry stools or BRBPR, remains on ferrous sulfate and Protonix Dementia- 04/2021 CT head noted chronic vascular changes, MMSE 16/30, no behavioral outbursts, remains on Zyprexa and Sertaline HTN- BUN/creat 16/0.5 06/05/2022, remains on amlodipine, HCTZ, losartan, and metoprolol Grade I diastolic dysfunction- 27/2536 echo LVEF 65-70% PAF- HR controlled with metoprolol, remains on asa for clot prevention CAD- hx demand ischemia, stent placement 1992 & 2005, remains on asa and metoprolol and statin HLD- LDL 27 12/2021, remains on statin Depression- no mood changes, remains on sertraline  No recent falls or injuries. Ambulates with walker.   Recent blood pressures:  06/29- 128/66  06/28- 118/70, 150/85  06/27- 134/69  Recent weights:  06/01- 122.2 lbs  05/01- 123.6 lbs  04/03- 122.8 lbs    Past Medical History:  Diagnosis Date   Anxiety and depression    Aortic stenosis    a. 03/2014: Mild aortic stenosis by valve area and mean gradient though appeared more visually consistent with moderate AS.    CAD (coronary artery disease)    a. s/p prior PCI of D2 in 1992 b. stent to RCA in 2005   Diverticula of colon    GERD (gastroesophageal reflux disease)    HTN (hypertension)    Hypercholesteremia    Lateral myocardial infarction [facility]) 1992   Osteoarthritis    Osteopenia    Scoliosis    Past Surgical History:  Procedure Laterality Date   ABDOMINAL  HYSTERECTOMY     APPENDECTOMY     CORONARY ANGIOPLASTY     TONSILLECTOMY      Allergies  Allergen Reactions   Latex Other (See Comments)    Unknown reaction - listed on [facility] 02/10/22   Nifedipine Other (See Comments)    Dark Urine; can only take orange tablet, allergic to yellow-brown tablet   Tetanus Toxoids Other (See Comments)    Unknown reaction   Tetanus-Diphtheria  Toxoids Td Other (See Comments)    Unknown reaction   Adhesive [Tape] Rash    Outpatient Encounter Medications as of 06/23/2022  Medication Sig   acetaminophen (TYLENOL) 500 MG tablet Take 1,000 mg by mouth 2 (two) times daily.   amLODipine (NORVASC) 5 MG tablet Take 5 mg by mouth daily.   atorvastatin (LIPITOR) 10 MG tablet Take 10 mg by mouth at bedtime.   cetirizine (ZYRTEC) 10 MG tablet Take 10 mg by mouth every morning.   cholecalciferol (VITAMIN D) 25 MCG (1000 UNIT) tablet Take 1,000 Units by mouth every evening.   collagenase (SANTYL) 250 UNIT/GM ointment Apply 1 application. topically daily. Apply to the RLE open wound bed-yellow slough daily   ferrous sulfate 325 (65 FE) MG tablet Take 1 tablet (325 mg total) by mouth daily with breakfast.   folic acid (FOLVITE) 1 MG tablet Take 1 tablet (1 mg total) by mouth daily.   hydrochlorothiazide (HYDRODIURIL) 25 MG tablet Take 25 mg by mouth in the morning.   losartan (COZAAR) 100 MG tablet Take 100 mg by mouth at bedtime.   metoprolol succinate (TOPROL-XL) 50 MG 24 hr tablet Take 50 mg by mouth every evening. Take with or immediately following a meal.   nitroGLYCERIN (NITROSTAT) 0.4 MG SL tablet Place 1 tablet (0.4 mg total) under the tongue every 5 (five) minutes as needed for chest pain.   OLANZapine (ZYPREXA) 2.5 MG tablet Take 2.5 mg by mouth at bedtime.   pantoprazole (PROTONIX) 40 MG tablet Take 1 tablet (40 mg total) by mouth daily.   potassium chloride (KLOR-CON M) 10 MEQ tablet Take 10 mEq by mouth daily.   senna (SENOKOT) 8.6 MG TABS tablet Take 1 tablet by mouth in the morning.   sertraline (ZOLOFT) 25 MG tablet Take 25 mg by mouth at bedtime.   [DISCONTINUED] aspirin 81 MG chewable tablet Chew by mouth daily.   [DISCONTINUED] atorvastatin (LIPITOR) 20 MG tablet Take 20 mg by mouth daily.   [DISCONTINUED] doxycycline (VIBRA-TABS) 100 MG tablet Take 100 mg by mouth 2 (two) times daily.   [DISCONTINUED] mupirocin ointment  (BACTROBAN) 2 % 1 application. 2 (two) times daily.   No facility-administered encounter medications on file as of 06/23/2022.    Review of Systems  Unable to perform ROS: Dementia    Immunization History  Administered Date(s) Administered   Influenza Split 05/13/2009, 05/18/2010, 09/29/2011, 09/05/2012, 09/03/2013   Influenza, High Dose Seasonal PF 09/09/2015, 08/30/2017   Influenza, Quadrivalent, Recombinant, Inj, Pf 08/19/2018, 09/29/2019   Influenza-Unspecified 10/18/2021   Moderna Covid-19 Vaccine Bivalent Booster 74yr & up 09/14/2021   Moderna SARS-COV2 Booster Vaccination 06/01/2021   Moderna Sars-Covid-2 Vaccination 12/29/2019, 01/26/2020   Pneumococcal Conjugate-13 07/08/2013   Pneumococcal Polysaccharide-23 06/29/2006, 05/13/2009, 05/18/2010, 05/25/2011   Td 05/13/2009, 05/18/2010, 05/25/2011   Zoster Recombinat (Shingrix) 08/24/2017, 10/31/2017   Zoster, Live 08/22/2008, 08/24/2008, 05/13/2009, 05/18/2010, 05/25/2011   Pertinent  Health Maintenance Due  Topic Date Due   INFLUENZA VACCINE  07/25/2022   DEXA SCAN  Completed  05/02/2021   10:07 AM 12/14/2021   12:17 PM 02/10/2022   10:00 PM 02/11/2022    8:00 AM 03/20/2022   10:22 AM  Fall Risk  Falls in the past year?     1  Was there an injury with Fall?     0  Fall Risk Category Calculator     1  Fall Risk Category     Low  Patient Fall Risk Level High fall risk Low fall risk Moderate fall risk Moderate fall risk Moderate fall risk  Patient at Risk for Falls Due to     History of fall(s);Impaired balance/gait;Mental status change;Other (Comment)  Patient at Risk for Falls Due to - Comments     vascular dementia  Fall risk Follow up     Falls evaluation completed;Education provided;Falls prevention discussed   Functional Status Survey:    Vitals:   06/23/22 0959  BP: 128/66  Pulse: 68  Resp: (!) 22  Temp: (!) 97 F (36.1 C)  SpO2: 94%  Weight: 122 lb 3.2 oz (55.4 kg)  Height: 5' (1.524 m)   Body  mass index is 23.87 kg/m. Physical Exam Vitals reviewed.  Constitutional:      General: She is not in acute distress. HENT:     Head: Normocephalic.     Right Ear: There is no impacted cerumen.     Left Ear: There is no impacted cerumen.     Nose: Nose normal.     Mouth/Throat:     Mouth: Mucous membranes are moist.  Eyes:     General:        Right eye: No discharge.        Left eye: No discharge.  Cardiovascular:     Rate and Rhythm: Normal rate. Rhythm irregular.     Pulses: Normal pulses.     Heart sounds: Murmur heard.  Pulmonary:     Effort: Pulmonary effort is normal. No respiratory distress.     Breath sounds: Normal breath sounds. No wheezing.  Abdominal:     General: Bowel sounds are normal. There is no distension.     Palpations: Abdomen is soft.     Tenderness: There is no abdominal tenderness.  Musculoskeletal:     Cervical back: Neck supple.     Right lower leg: No edema.     Left lower leg: No edema.  Skin:    General: Skin is warm and dry.     Capillary Refill: Capillary refill takes less than 2 seconds.     Findings: Lesion present.     Comments: Approx 2 cm x 1 cm round open lesion to RLE, purulent drainage, tender to touch, surrounding skin intact  Neurological:     General: No focal deficit present.     Mental Status: She is alert. Mental status is at baseline.     Motor: Weakness present.     Gait: Gait abnormal.     Comments: Walker  Psychiatric:        Mood and Affect: Mood normal.        Behavior: Behavior normal.        Cognition and Memory: Cognition is impaired. Memory is impaired.     Comments: Very pleasant, follows commands, alert to self/person     Labs reviewed: Recent Labs    02/10/22 1303 02/11/22 0108 06/05/22 0000  NA 137 138 142  K 3.4* 3.0* 3.9  CL 104 106 107  CO2 24 24 29*  GLUCOSE 98 110*  --  BUN '16 11 16  '$ CREATININE 0.74 0.64 0.5  CALCIUM 8.5* 8.3* 8.7   Recent Labs    11/24/21 0000 02/09/22 0000  02/10/22 1303  AST '16 23 28  '$ ALT '12 22 25  '$ ALKPHOS 106 90 97  BILITOT  --   --  0.5  PROT  --   --  6.3*  ALBUMIN 3.2* 3.7 3.2*   Recent Labs    02/10/22 1303 02/11/22 0108 02/20/22 0000 03/09/22 0000 06/05/22 0000  WBC 4.6 5.8 3.9 3.7 4.2  NEUTROABS 2.8 4.1 2,071.00 1,550.00  --   HGB 6.1* 8.0* 9.1* 10.7* 12.5  HCT 21.8* 26.6* 31* 36 39  MCV 82.0 80.4  --   --   --   PLT 253 216 291 266 209   Lab Results  Component Value Date   TSH 2.90 11/24/2021   No results found for: "HGBA1C" Lab Results  Component Value Date   CHOL 86 01/23/2022   HDL 43 01/23/2022   LDLCALC 27 01/23/2022   TRIG 77 01/23/2022   CHOLHDL 1.9 09/17/2020    Significant Diagnostic Results in last 30 days:  No results found.  Assessment/Plan 1. Venous stasis ulcer of right lower leg with edema of right lower leg (HCC) - ongoing - slow healing- onset 02/24/2022 - treated with doxycycline and mupirocin  - purulent drainage and tenderness today- known to pick at wound - start doxycycline 100 mg po bid x 10 days - cover wound with mepliex during day  2. Symptomatic anemia - d/t GI bleed 01/2022 - hgb 12.5 06/05/2022 - cont ferrous sulfate and Protonix  3. Vascular dementia with behavior disturbance ([place]) - no behavioral outbursts - cont Zyprexa - cont assisted living care  4. Primary hypertension - controlled with,amlodipine HCTZ, metoprolol and losartan  5. Grade I diastolic dysfunction - LVEF 65-70% 08/2020 - no further workup at this time  6. PAF (paroxysmal atrial fibrillation) (HCC) - HR controlled with metoprolol - cont asa for clot prevention  7. Coronary artery disease due to lipid rich plaque - cont asa, metoprolol and statin  8. Hypercholesteremia - cont statin  9. Anxiety and depression - no mood changes - cont zoloft  10. Gait instability - no recent falls - ambulates with walker    Family/ staff Communication: plan discussed with patient and  nurse  Labs/tests ordered:  none

## 2022-08-09 DIAGNOSIS — R4189 Other symptoms and signs involving cognitive functions and awareness: Secondary | ICD-10-CM | POA: Diagnosis not present

## 2022-08-09 DIAGNOSIS — R41841 Cognitive communication deficit: Secondary | ICD-10-CM | POA: Diagnosis not present

## 2022-08-10 DIAGNOSIS — R41841 Cognitive communication deficit: Secondary | ICD-10-CM | POA: Diagnosis not present

## 2022-08-10 DIAGNOSIS — R4189 Other symptoms and signs involving cognitive functions and awareness: Secondary | ICD-10-CM | POA: Diagnosis not present

## 2022-08-11 DIAGNOSIS — R41841 Cognitive communication deficit: Secondary | ICD-10-CM | POA: Diagnosis not present

## 2022-08-11 DIAGNOSIS — R4189 Other symptoms and signs involving cognitive functions and awareness: Secondary | ICD-10-CM | POA: Diagnosis not present

## 2022-08-14 DIAGNOSIS — R4189 Other symptoms and signs involving cognitive functions and awareness: Secondary | ICD-10-CM | POA: Diagnosis not present

## 2022-08-14 DIAGNOSIS — R41841 Cognitive communication deficit: Secondary | ICD-10-CM | POA: Diagnosis not present

## 2022-08-15 DIAGNOSIS — R4189 Other symptoms and signs involving cognitive functions and awareness: Secondary | ICD-10-CM | POA: Diagnosis not present

## 2022-08-15 DIAGNOSIS — R41841 Cognitive communication deficit: Secondary | ICD-10-CM | POA: Diagnosis not present

## 2022-08-16 DIAGNOSIS — R4189 Other symptoms and signs involving cognitive functions and awareness: Secondary | ICD-10-CM | POA: Diagnosis not present

## 2022-08-16 DIAGNOSIS — R41841 Cognitive communication deficit: Secondary | ICD-10-CM | POA: Diagnosis not present

## 2022-08-17 DIAGNOSIS — R4189 Other symptoms and signs involving cognitive functions and awareness: Secondary | ICD-10-CM | POA: Diagnosis not present

## 2022-08-17 DIAGNOSIS — R41841 Cognitive communication deficit: Secondary | ICD-10-CM | POA: Diagnosis not present

## 2022-08-18 DIAGNOSIS — R41841 Cognitive communication deficit: Secondary | ICD-10-CM | POA: Diagnosis not present

## 2022-08-18 DIAGNOSIS — R4189 Other symptoms and signs involving cognitive functions and awareness: Secondary | ICD-10-CM | POA: Diagnosis not present

## 2022-08-21 DIAGNOSIS — R4189 Other symptoms and signs involving cognitive functions and awareness: Secondary | ICD-10-CM | POA: Diagnosis not present

## 2022-08-21 DIAGNOSIS — R41841 Cognitive communication deficit: Secondary | ICD-10-CM | POA: Diagnosis not present

## 2022-08-24 DIAGNOSIS — R41841 Cognitive communication deficit: Secondary | ICD-10-CM | POA: Diagnosis not present

## 2022-08-24 DIAGNOSIS — R4189 Other symptoms and signs involving cognitive functions and awareness: Secondary | ICD-10-CM | POA: Diagnosis not present

## 2022-08-25 DIAGNOSIS — I4891 Unspecified atrial fibrillation: Secondary | ICD-10-CM | POA: Diagnosis not present

## 2022-08-25 DIAGNOSIS — R4189 Other symptoms and signs involving cognitive functions and awareness: Secondary | ICD-10-CM | POA: Diagnosis not present

## 2022-08-25 DIAGNOSIS — R2681 Unsteadiness on feet: Secondary | ICD-10-CM | POA: Diagnosis not present

## 2022-08-25 DIAGNOSIS — R41841 Cognitive communication deficit: Secondary | ICD-10-CM | POA: Diagnosis not present

## 2022-08-25 DIAGNOSIS — M545 Low back pain, unspecified: Secondary | ICD-10-CM | POA: Diagnosis not present

## 2022-08-25 DIAGNOSIS — M6281 Muscle weakness (generalized): Secondary | ICD-10-CM | POA: Diagnosis not present

## 2022-08-25 DIAGNOSIS — Z9181 History of falling: Secondary | ICD-10-CM | POA: Diagnosis not present

## 2022-08-28 DIAGNOSIS — R4189 Other symptoms and signs involving cognitive functions and awareness: Secondary | ICD-10-CM | POA: Diagnosis not present

## 2022-08-28 DIAGNOSIS — R41841 Cognitive communication deficit: Secondary | ICD-10-CM | POA: Diagnosis not present

## 2022-08-28 DIAGNOSIS — M6281 Muscle weakness (generalized): Secondary | ICD-10-CM | POA: Diagnosis not present

## 2022-08-28 DIAGNOSIS — Z9181 History of falling: Secondary | ICD-10-CM | POA: Diagnosis not present

## 2022-08-28 DIAGNOSIS — I4891 Unspecified atrial fibrillation: Secondary | ICD-10-CM | POA: Diagnosis not present

## 2022-08-28 DIAGNOSIS — M545 Low back pain, unspecified: Secondary | ICD-10-CM | POA: Diagnosis not present

## 2022-08-29 DIAGNOSIS — Z9181 History of falling: Secondary | ICD-10-CM | POA: Diagnosis not present

## 2022-08-29 DIAGNOSIS — M545 Low back pain, unspecified: Secondary | ICD-10-CM | POA: Diagnosis not present

## 2022-08-29 DIAGNOSIS — M6281 Muscle weakness (generalized): Secondary | ICD-10-CM | POA: Diagnosis not present

## 2022-08-29 DIAGNOSIS — R41841 Cognitive communication deficit: Secondary | ICD-10-CM | POA: Diagnosis not present

## 2022-08-29 DIAGNOSIS — I4891 Unspecified atrial fibrillation: Secondary | ICD-10-CM | POA: Diagnosis not present

## 2022-08-29 DIAGNOSIS — R4189 Other symptoms and signs involving cognitive functions and awareness: Secondary | ICD-10-CM | POA: Diagnosis not present

## 2022-08-31 DIAGNOSIS — R41841 Cognitive communication deficit: Secondary | ICD-10-CM | POA: Diagnosis not present

## 2022-08-31 DIAGNOSIS — M545 Low back pain, unspecified: Secondary | ICD-10-CM | POA: Diagnosis not present

## 2022-08-31 DIAGNOSIS — I4891 Unspecified atrial fibrillation: Secondary | ICD-10-CM | POA: Diagnosis not present

## 2022-08-31 DIAGNOSIS — R4189 Other symptoms and signs involving cognitive functions and awareness: Secondary | ICD-10-CM | POA: Diagnosis not present

## 2022-08-31 DIAGNOSIS — M6281 Muscle weakness (generalized): Secondary | ICD-10-CM | POA: Diagnosis not present

## 2022-08-31 DIAGNOSIS — Z9181 History of falling: Secondary | ICD-10-CM | POA: Diagnosis not present

## 2022-09-01 ENCOUNTER — Non-Acute Institutional Stay: Payer: Medicare Other | Admitting: Orthopedic Surgery

## 2022-09-01 ENCOUNTER — Encounter: Payer: Self-pay | Admitting: Orthopedic Surgery

## 2022-09-01 DIAGNOSIS — R41841 Cognitive communication deficit: Secondary | ICD-10-CM | POA: Diagnosis not present

## 2022-09-01 DIAGNOSIS — I2510 Atherosclerotic heart disease of native coronary artery without angina pectoris: Secondary | ICD-10-CM | POA: Diagnosis not present

## 2022-09-01 DIAGNOSIS — F32A Depression, unspecified: Secondary | ICD-10-CM | POA: Diagnosis not present

## 2022-09-01 DIAGNOSIS — F419 Anxiety disorder, unspecified: Secondary | ICD-10-CM

## 2022-09-01 DIAGNOSIS — I2583 Coronary atherosclerosis due to lipid rich plaque: Secondary | ICD-10-CM | POA: Diagnosis not present

## 2022-09-01 DIAGNOSIS — E7800 Pure hypercholesterolemia, unspecified: Secondary | ICD-10-CM

## 2022-09-01 DIAGNOSIS — D649 Anemia, unspecified: Secondary | ICD-10-CM

## 2022-09-01 DIAGNOSIS — I480 Paroxysmal atrial fibrillation: Secondary | ICD-10-CM

## 2022-09-01 DIAGNOSIS — I5189 Other ill-defined heart diseases: Secondary | ICD-10-CM | POA: Diagnosis not present

## 2022-09-01 DIAGNOSIS — L853 Xerosis cutis: Secondary | ICD-10-CM | POA: Diagnosis not present

## 2022-09-01 DIAGNOSIS — F01518 Vascular dementia, unspecified severity, with other behavioral disturbance: Secondary | ICD-10-CM

## 2022-09-01 DIAGNOSIS — I10 Essential (primary) hypertension: Secondary | ICD-10-CM

## 2022-09-01 DIAGNOSIS — M6281 Muscle weakness (generalized): Secondary | ICD-10-CM | POA: Diagnosis not present

## 2022-09-01 DIAGNOSIS — Z9181 History of falling: Secondary | ICD-10-CM | POA: Diagnosis not present

## 2022-09-01 DIAGNOSIS — R4189 Other symptoms and signs involving cognitive functions and awareness: Secondary | ICD-10-CM | POA: Diagnosis not present

## 2022-09-01 DIAGNOSIS — I4891 Unspecified atrial fibrillation: Secondary | ICD-10-CM | POA: Diagnosis not present

## 2022-09-01 DIAGNOSIS — M545 Low back pain, unspecified: Secondary | ICD-10-CM | POA: Diagnosis not present

## 2022-09-01 NOTE — Progress Notes (Signed)
Location:  [facility] Room Number: 18/A Place of Service:  ALF [id number]) Provider:  [name], NP   [name], MD  Patient Care Team: [name], MD as PCP - General (Internal Medicine) [place]ique, Peter M, MD as PCP - Cardiology (Cardiology)  Extended Emergency Contact Information Primary Emergency Contact: [name],Patricia Address: [phone] [place]          [place] [address] [name] of [place] Phone: [phone] Mobile Phone: [phone] Relation: Sister Secondary Emergency Contact: [name],[name]  [name] of [place] Phone: [phone] Mobile Phone: [phone] Relation: Niece  Code Status:  DNR Goals of care: Advanced Directive information    06/23/2022   10:04 AM  Advanced Directives  Does Patient Have a Medical Advance Directive? Yes  Type of Paramedic of [place];Living will;Out of facility DNR (pink MOST or yellow form)  Does patient want to make changes to medical advance directive? No - Patient declined  Copy of [place] in Chart? Yes - validated most recent copy scanned in chart (See row information)  Pre-existing out of facility DNR order (yellow form or pink MOST form) Pink MOST form placed in chart (order not valid for inpatient use)     Chief Complaint  Patient presents with   Medical Management of Chronic Issues    HPI:  Pt is a 88 y.o. female seen today for medical management of chronic diseases.    She currently resides on the assisted living unit at [facility]. PMH: aortic stenosis,CAD, HTN, HLD, atrial fib, emphysema, GERD, vascular dementia, and depression.    Dementia- 04/2021 CT head noted chronic vascular changes, MMSE 16/30, no behavioral outbursts, lives in AL, ambulates with rolator, remains on Zyprexa and Sertaline HTN- BUN/creat 16/0.5 06/05/2022, remains on amlodipine, HCTZ, losartan, and metoprolol Grade I diastolic dysfunction- 93/8101  echo LVEF 65-70% PAF- HR controlled with metoprolol, remains on asa for clot prevention CAD- hx demand ischemia, stent placement 1992 & 2005, remains on asa, metoprolol and statin HLD- LDL 27 12/2021, remains on statin Symptomatic anemia- ED visit 02/17-02/18- hgb 6.1- given 2 units PRBC/ + hemoccult, hgb 9.1 02/16/2022- now 12.5 06/05/2022, denies black tarry stools or BRBPR, remains on ferrous sulfate and Protonix Depression- no mood changes, Na+ 142 06/05/2022 remains on sertraline  08/28 mechanical fall, no apparent injury.   Recent blood pressures:  09/07- 110/63  09/06- 134/76, 123/71  09/05- 130/66  Recent weights:  09/04- 126.4 lbs  08/02- 125.2 lbs  07/05- 123.6 lbs     Past Medical History:  Diagnosis Date   Anxiety and depression    Aortic stenosis    a. 03/2014: Mild aortic stenosis by valve area and mean gradient though appeared more visually consistent with moderate AS.    CAD (coronary artery disease)    a. s/p prior PCI of D2 in 1992 b. stent to RCA in 2005   Diverticula of colon    GERD (gastroesophageal reflux disease)    HTN (hypertension)    Hypercholesteremia    Lateral myocardial infarction [facility]) 1992   Osteoarthritis    Osteopenia    Scoliosis    Past Surgical History:  Procedure Laterality Date   ABDOMINAL HYSTERECTOMY     APPENDECTOMY     CORONARY ANGIOPLASTY     TONSILLECTOMY      Allergies  Allergen Reactions   Latex Other (See Comments)    Unknown reaction - listed on [facility] 02/10/22   Nifedipine  Other (See Comments)    Dark Urine; can only take orange tablet, allergic to yellow-brown tablet   Tetanus Toxoids Other (See Comments)    Unknown reaction   Tetanus-Diphtheria Toxoids Td Other (See Comments)    Unknown reaction   Adhesive [Tape] Rash    Outpatient Encounter Medications as of 09/01/2022  Medication Sig   acetaminophen (TYLENOL) 500 MG tablet Take 1,000 mg by mouth 2 (two) times daily.   amLODipine (NORVASC) 5 MG tablet Take  5 mg by mouth daily.   atorvastatin (LIPITOR) 10 MG tablet Take 10 mg by mouth at bedtime.   cetirizine (ZYRTEC) 10 MG tablet Take 10 mg by mouth every morning.   cholecalciferol (VITAMIN D) 25 MCG (1000 UNIT) tablet Take 1,000 Units by mouth every evening.   collagenase (SANTYL) 250 UNIT/GM ointment Apply 1 application. topically daily. Apply to the RLE open wound bed-yellow slough daily   ferrous sulfate 325 (65 FE) MG tablet Take 1 tablet (325 mg total) by mouth daily with breakfast.   folic acid (FOLVITE) 1 MG tablet Take 1 tablet (1 mg total) by mouth daily.   hydrochlorothiazide (HYDRODIURIL) 25 MG tablet Take 25 mg by mouth in the morning.   losartan (COZAAR) 100 MG tablet Take 100 mg by mouth at bedtime.   metoprolol succinate (TOPROL-XL) 50 MG 24 hr tablet Take 50 mg by mouth every evening. Take with or immediately following a meal.   nitroGLYCERIN (NITROSTAT) 0.4 MG SL tablet Place 1 tablet (0.4 mg total) under the tongue every 5 (five) minutes as needed for chest pain.   OLANZapine (ZYPREXA) 2.5 MG tablet Take 2.5 mg by mouth at bedtime.   pantoprazole (PROTONIX) 40 MG tablet Take 1 tablet (40 mg total) by mouth daily.   potassium chloride (KLOR-CON M) 10 MEQ tablet Take 10 mEq by mouth daily.   senna (SENOKOT) 8.6 MG TABS tablet Take 1 tablet by mouth in the morning.   sertraline (ZOLOFT) 25 MG tablet Take 25 mg by mouth at bedtime.   No facility-administered encounter medications on file as of 09/01/2022.    Review of Systems  Unable to perform ROS: Dementia    Immunization History  Administered Date(s) Administered   Influenza Split 05/13/2009, 05/18/2010, 09/29/2011, 09/05/2012, 09/03/2013   Influenza, High Dose Seasonal PF 09/09/2015, 08/30/2017   Influenza, Quadrivalent, Recombinant, Inj, Pf 08/19/2018, 09/29/2019   Influenza-Unspecified 10/18/2021   Moderna Covid-19 Vaccine Bivalent Booster 75yr & up 09/14/2021   Moderna SARS-COV2 Booster Vaccination 06/01/2021    Moderna Sars-Covid-2 Vaccination 12/29/2019, 01/26/2020   Pneumococcal Conjugate-13 07/08/2013   Pneumococcal Polysaccharide-23 06/29/2006, 05/13/2009, 05/18/2010, 05/25/2011   Td 05/13/2009, 05/18/2010, 05/25/2011   Zoster Recombinat (Shingrix) 08/24/2017, 10/31/2017   Zoster, Live 08/22/2008, 08/24/2008, 05/13/2009, 05/18/2010, 05/25/2011   Pertinent  Health Maintenance Due  Topic Date Due   INFLUENZA VACCINE  07/25/2022   DEXA SCAN  Completed      05/02/2021   10:07 AM 12/14/2021   12:17 PM 02/10/2022   10:00 PM 02/11/2022    8:00 AM 03/20/2022   10:22 AM  Fall Risk  Falls in the past year?     1  Was there an injury with Fall?     0  Fall Risk Category Calculator     1  Fall Risk Category     Low  Patient Fall Risk Level High fall risk Low fall risk Moderate fall risk Moderate fall risk Moderate fall risk  Patient at Risk for Falls Due to  History of fall(s);Impaired balance/gait;Mental status change;Other (Comment)  Patient at Risk for Falls Due to - Comments     vascular dementia  Fall risk Follow up     Falls evaluation completed;Education provided;Falls prevention discussed   Functional Status Survey:    Vitals:   09/01/22 1346  BP: 110/63  Pulse: (!) 59  Resp: 18  Temp: 98.9 F (37.2 C)  SpO2: 98%  Weight: 126 lb 6.4 oz (57.3 kg)   Body mass index is 24.69 kg/m. Physical Exam Vitals reviewed.  Constitutional:      General: She is not in acute distress. HENT:     Head: Normocephalic.     Right Ear: There is no impacted cerumen.     Left Ear: There is no impacted cerumen.     Nose: Nose normal.     Mouth/Throat:     Mouth: Mucous membranes are moist.  Eyes:     General:        Right eye: No discharge.        Left eye: No discharge.  Cardiovascular:     Rate and Rhythm: Normal rate. Rhythm irregular.     Pulses: Normal pulses.     Heart sounds: Murmur heard.  Pulmonary:     Effort: Pulmonary effort is normal. No respiratory distress.     Breath  sounds: Normal breath sounds. No wheezing.  Abdominal:     General: Bowel sounds are normal. There is no distension.     Palpations: Abdomen is soft.     Tenderness: There is no abdominal tenderness.  Musculoskeletal:     Cervical back: Neck supple.     Right lower leg: No edema.     Left lower leg: No edema.  Skin:    General: Skin is warm and dry.     Capillary Refill: Capillary refill takes less than 2 seconds.     Comments: Skin ulcer to RLE healed, extremities with dry skin  Neurological:     General: No focal deficit present.     Mental Status: She is alert. Mental status is at baseline.     Motor: Weakness present.     Gait: Gait abnormal.  Psychiatric:        Mood and Affect: Mood normal.        Behavior: Behavior normal.     Comments: Very pleasant, follows commands, alert to self/familiar face     Labs reviewed: Recent Labs    02/10/22 1303 02/11/22 0108 06/05/22 0000  NA 137 138 142  K 3.4* 3.0* 3.9  CL 104 106 107  CO2 24 24 29*  GLUCOSE 98 110*  --   BUN '16 11 16  '$ CREATININE 0.74 0.64 0.5  CALCIUM 8.5* 8.3* 8.7   Recent Labs    11/24/21 0000 02/09/22 0000 02/10/22 1303  AST '16 23 28  '$ ALT '12 22 25  '$ ALKPHOS 106 90 97  BILITOT  --   --  0.5  PROT  --   --  6.3*  ALBUMIN 3.2* 3.7 3.2*   Recent Labs    02/10/22 1303 02/11/22 0108 02/20/22 0000 03/09/22 0000 06/05/22 0000  WBC 4.6 5.8 3.9 3.7 4.2  NEUTROABS 2.8 4.1 2,071.00 1,550.00  --   HGB 6.1* 8.0* 9.1* 10.7* 12.5  HCT 21.8* 26.6* 31* 36 39  MCV 82.0 80.4  --   --   --   PLT 253 216 291 266 209   Lab Results  Component Value Date   TSH  2.90 11/24/2021   No results found for: "HGBA1C" Lab Results  Component Value Date   CHOL 86 01/23/2022   HDL 43 01/23/2022   LDLCALC 27 01/23/2022   TRIG 77 01/23/2022   CHOLHDL 1.9 09/17/2020    Significant Diagnostic Results in last 30 days:  No results found.  Assessment/Plan 1. Vascular dementia with behavior disturbance ([place]) - MMSE  16/30 - no behaviors - doing well in AL - ambulates with rolator - cont Zyprexa  2. Primary hypertension - controlled - con amlodipine, HCTZ, metoprolol and losartan  3. Grade I diastolic dysfunction - no further workup   4. PAF (paroxysmal atrial fibrillation) (HCC) - HR controlled with metoprolol - cont asa for clot prevention  5. Coronary artery disease due to lipid rich plaque - cont asa, metoprolol and statin  6. Hypercholesteremia - cont statin  7. Symptomatic anemia - hgb stable - GI bleed 01/2022 - cont ferrous sulfate and Protonix  8. Anxiety and depression - no mood changes - cont Zoloft  9. Dry skin - start cerave daily     Family/ staff Communication: plan discussed with patient and nurse  Labs/tests ordered:  none

## 2022-09-04 DIAGNOSIS — M545 Low back pain, unspecified: Secondary | ICD-10-CM | POA: Diagnosis not present

## 2022-09-04 DIAGNOSIS — I4891 Unspecified atrial fibrillation: Secondary | ICD-10-CM | POA: Diagnosis not present

## 2022-09-04 DIAGNOSIS — R4189 Other symptoms and signs involving cognitive functions and awareness: Secondary | ICD-10-CM | POA: Diagnosis not present

## 2022-09-04 DIAGNOSIS — R41841 Cognitive communication deficit: Secondary | ICD-10-CM | POA: Diagnosis not present

## 2022-09-04 DIAGNOSIS — M6281 Muscle weakness (generalized): Secondary | ICD-10-CM | POA: Diagnosis not present

## 2022-09-04 DIAGNOSIS — Z9181 History of falling: Secondary | ICD-10-CM | POA: Diagnosis not present

## 2022-09-05 ENCOUNTER — Non-Acute Institutional Stay: Payer: Medicare Other | Admitting: Orthopedic Surgery

## 2022-09-05 ENCOUNTER — Encounter: Payer: Self-pay | Admitting: Orthopedic Surgery

## 2022-09-05 DIAGNOSIS — M546 Pain in thoracic spine: Secondary | ICD-10-CM

## 2022-09-05 DIAGNOSIS — M6281 Muscle weakness (generalized): Secondary | ICD-10-CM | POA: Diagnosis not present

## 2022-09-05 DIAGNOSIS — R41841 Cognitive communication deficit: Secondary | ICD-10-CM | POA: Diagnosis not present

## 2022-09-05 DIAGNOSIS — I4891 Unspecified atrial fibrillation: Secondary | ICD-10-CM | POA: Diagnosis not present

## 2022-09-05 DIAGNOSIS — S0003XA Contusion of scalp, initial encounter: Secondary | ICD-10-CM | POA: Diagnosis not present

## 2022-09-05 DIAGNOSIS — W19XXXA Unspecified fall, initial encounter: Secondary | ICD-10-CM

## 2022-09-05 DIAGNOSIS — M545 Low back pain, unspecified: Secondary | ICD-10-CM | POA: Diagnosis not present

## 2022-09-05 DIAGNOSIS — Z9181 History of falling: Secondary | ICD-10-CM | POA: Diagnosis not present

## 2022-09-05 DIAGNOSIS — R4189 Other symptoms and signs involving cognitive functions and awareness: Secondary | ICD-10-CM | POA: Diagnosis not present

## 2022-09-05 NOTE — Progress Notes (Unsigned)
Location:   [facility] Room Number: [place] of Service:  ALF [phone]) Provider:  Windell Moulding, NP  PCP: [name], MD  Patient Care Team: [name], MD as PCP - General (Internal Medicine) Martinique, Peter M, MD as PCP - Cardiology (Cardiology)  Extended Emergency Contact Information Primary Emergency Contact: [name],Patricia Address: [phone]          [place] [address] [name] of [place] Phone: [phone] Mobile Phone: [phone] Relation: Sister Secondary Emergency Contact: [name],[name]  [name] of [place] Phone: [phone] Mobile Phone: [phone] Relation: Niece  Code Status:  DNR Goals of care: Advanced Directive information    09/05/2022    3:09 PM  Advanced Directives  Does Patient Have a Medical Advance Directive? Yes  Type of Paramedic of [place];Living will;Out of facility DNR (pink MOST or yellow form)  Does patient want to make changes to medical advance directive? No - Patient declined  Copy of [place] in Chart? Yes - validated most recent copy scanned in chart (See row information)     Chief Complaint  Patient presents with   Acute Visit    Fall/Hematuria.    HPI:  Pt is a 88 y.o. female seen today for an acute visit for    Past Medical History:  Diagnosis Date   Anxiety and depression    Aortic stenosis    a. 03/2014: Mild aortic stenosis by valve area and mean gradient though appeared more visually consistent with moderate AS.    CAD (coronary artery disease)    a. s/p prior PCI of D2 in 1992 b. stent to RCA in 2005   Diverticula of colon    GERD (gastroesophageal reflux disease)    HTN (hypertension)    Hypercholesteremia    Lateral myocardial infarction ([place]) 1992   Osteoarthritis    Osteopenia    Scoliosis    Past Surgical History:  Procedure Laterality Date   ABDOMINAL HYSTERECTOMY     APPENDECTOMY     CORONARY  ANGIOPLASTY     TONSILLECTOMY      Allergies  Allergen Reactions   Latex Other (See Comments)    Unknown reaction - listed on [facility] 02/10/22   Nifedipine Other (See Comments)    Dark Urine; can only take orange tablet, allergic to yellow-brown tablet   Tetanus Toxoids Other (See Comments)    Unknown reaction   Tetanus-Diphtheria Toxoids Td Other (See Comments)    Unknown reaction   Adhesive [Tape] Rash    Allergies as of 09/05/2022       Reactions   Latex Other (See Comments)   Unknown reaction - listed on [facility] 02/10/22   Nifedipine Other (See Comments)   Dark Urine; can only take orange tablet, allergic to yellow-brown tablet   Tetanus Toxoids Other (See Comments)   Unknown reaction   Tetanus-diphtheria Toxoids Td Other (See Comments)   Unknown reaction   Adhesive [tape] Rash        Medication List        Accurate as of September 05, 2022  3:10 PM. If you have any questions, ask your nurse or doctor.          acetaminophen 500 MG tablet Commonly known as: TYLENOL Take 1,000 mg by mouth 2 (two) times daily.   amLODipine 5 MG tablet Commonly known as: NORVASC Take 5 mg by mouth daily.   atorvastatin 10 MG tablet Commonly known as: LIPITOR  Take 10 mg by mouth at bedtime.   cetirizine 10 MG tablet Commonly known as: ZYRTEC Take 10 mg by mouth every morning.   cholecalciferol 25 MCG (1000 UNIT) tablet Commonly known as: VITAMIN D3 Take 2,000 Units by mouth every evening.   ferrous sulfate 325 (65 FE) MG tablet Take 1 tablet (325 mg total) by mouth daily with breakfast.   folic acid 1 MG tablet Commonly known as: FOLVITE Take 1 tablet (1 mg total) by mouth daily.   hydrochlorothiazide 25 MG tablet Commonly known as: HYDRODIURIL Take 25 mg by mouth in the morning.   losartan 100 MG tablet Commonly known as: COZAAR Take 100 mg by mouth at bedtime.   metoprolol succinate 50 MG 24 hr tablet Commonly known as: TOPROL-XL Take 50 mg by mouth every  evening. Take with or immediately following a meal.   nitroGLYCERIN 0.4 MG SL tablet Commonly known as: Nitrostat Place 1 tablet (0.4 mg total) under the tongue every 5 (five) minutes as needed for chest pain.   OLANZapine 2.5 MG tablet Commonly known as: ZYPREXA Take 2.5 mg by mouth at bedtime.   pantoprazole 40 MG tablet Commonly known as: PROTONIX Take 1 tablet (40 mg total) by mouth daily.   potassium chloride 10 MEQ tablet Commonly known as: KLOR-CON M Take 10 mEq by mouth daily.   senna 8.6 MG Tabs tablet Commonly known as: SENOKOT Take 1 tablet by mouth in the morning.   sertraline 25 MG tablet Commonly known as: ZOLOFT Take 25 mg by mouth at bedtime.        Review of Systems  Immunization History  Administered Date(s) Administered   Influenza Split 05/13/2009, 05/18/2010, 09/29/2011, 09/05/2012, 09/03/2013   Influenza, High Dose Seasonal PF 09/09/2015, 08/30/2017   Influenza, Quadrivalent, Recombinant, Inj, Pf 08/19/2018, 09/29/2019   Influenza-Unspecified 10/18/2021   Moderna Covid-19 Vaccine Bivalent Booster 34yr & up 09/14/2021   Moderna SARS-COV2 Booster Vaccination 06/01/2021   Moderna Sars-Covid-2 Vaccination 12/29/2019, 01/26/2020   Pneumococcal Conjugate-13 07/08/2013   Pneumococcal Polysaccharide-23 06/29/2006, 05/13/2009, 05/18/2010, 05/25/2011   Td 05/13/2009, 05/18/2010, 05/25/2011   Zoster Recombinat (Shingrix) 08/24/2017, 10/31/2017   Zoster, Live 08/22/2008, 08/24/2008, 05/13/2009, 05/18/2010, 05/25/2011   Pertinent  Health Maintenance Due  Topic Date Due   INFLUENZA VACCINE  07/25/2022   DEXA SCAN  Completed      05/02/2021   10:07 AM 12/14/2021   12:17 PM 02/10/2022   10:00 PM 02/11/2022    8:00 AM 03/20/2022   10:22 AM  Fall Risk  Falls in the past year?     1  Was there an injury with Fall?     0  Fall Risk Category Calculator     1  Fall Risk Category     Low  Patient Fall Risk Level High fall risk Low fall risk Moderate fall  risk Moderate fall risk Moderate fall risk  Patient at Risk for Falls Due to     History of fall(s);Impaired balance/gait;Mental status change;Other (Comment)  Patient at Risk for Falls Due to - Comments     vascular dementia  Fall risk Follow up     Falls evaluation completed;Education provided;Falls prevention discussed   Functional Status Survey:    Vitals:   09/05/22 1507  BP: 128/84  Pulse: 66  Resp: 18  Temp: 98.9 F (37.2 C)  SpO2: 98%  Weight: 126 lb 6.4 oz (57.3 kg)  Height: 5' (1.524 m)   Body mass index is 24.69 kg/m. Physical Exam  Labs  reviewed: Recent Labs    02/10/22 1303 02/11/22 0108 06/05/22 0000  NA 137 138 142  K 3.4* 3.0* 3.9  CL 104 106 107  CO2 24 24 29*  GLUCOSE 98 110*  --   BUN '16 11 16  '$ CREATININE 0.74 0.64 0.5  CALCIUM 8.5* 8.3* 8.7   Recent Labs    11/24/21 0000 02/09/22 0000 02/10/22 1303  AST '16 23 28  '$ ALT '12 22 25  '$ ALKPHOS 106 90 97  BILITOT  --   --  0.5  PROT  --   --  6.3*  ALBUMIN 3.2* 3.7 3.2*   Recent Labs    02/10/22 1303 02/11/22 0108 02/20/22 0000 03/09/22 0000 06/05/22 0000  WBC 4.6 5.8 3.9 3.7 4.2  NEUTROABS 2.8 4.1 2,071.00 1,550.00  --   HGB 6.1* 8.0* 9.1* 10.7* 12.5  HCT 21.8* 26.6* 31* 36 39  MCV 82.0 80.4  --   --   --   PLT 253 216 291 266 209   Lab Results  Component Value Date   TSH 2.90 11/24/2021   No results found for: "HGBA1C" Lab Results  Component Value Date   CHOL 86 01/23/2022   HDL 43 01/23/2022   LDLCALC 27 01/23/2022   TRIG 77 01/23/2022   CHOLHDL 1.9 09/17/2020    Significant Diagnostic Results in last 30 days:  No results found.  Assessment/Plan There are no diagnoses linked to this encounter.   Family/ staff Communication:   Labs/tests ordered:

## 2022-09-06 DIAGNOSIS — M546 Pain in thoracic spine: Secondary | ICD-10-CM | POA: Diagnosis not present

## 2022-09-06 DIAGNOSIS — M542 Cervicalgia: Secondary | ICD-10-CM | POA: Diagnosis not present

## 2022-09-06 DIAGNOSIS — M545 Low back pain, unspecified: Secondary | ICD-10-CM | POA: Diagnosis not present

## 2022-09-07 ENCOUNTER — Encounter: Payer: Self-pay | Admitting: Internal Medicine

## 2022-09-07 ENCOUNTER — Non-Acute Institutional Stay: Payer: Medicare Other | Admitting: Internal Medicine

## 2022-09-07 DIAGNOSIS — S22000A Wedge compression fracture of unspecified thoracic vertebra, initial encounter for closed fracture: Secondary | ICD-10-CM | POA: Diagnosis not present

## 2022-09-07 DIAGNOSIS — S32030S Wedge compression fracture of third lumbar vertebra, sequela: Secondary | ICD-10-CM

## 2022-09-07 DIAGNOSIS — R079 Chest pain, unspecified: Secondary | ICD-10-CM

## 2022-09-07 DIAGNOSIS — W19XXXD Unspecified fall, subsequent encounter: Secondary | ICD-10-CM

## 2022-09-07 DIAGNOSIS — F02B18 Dementia in other diseases classified elsewhere, moderate, with other behavioral disturbance: Secondary | ICD-10-CM | POA: Diagnosis not present

## 2022-09-07 DIAGNOSIS — G301 Alzheimer's disease with late onset: Secondary | ICD-10-CM

## 2022-09-07 NOTE — Progress Notes (Unsigned)
Location:  [facility] Room Number: AL/18/A Place of Service:  ALF [phone]) Provider:  [name], MD  Patient Care Team: [name], MD as PCP - General (Internal Medicine) Martinique, Peter M, MD as PCP - Cardiology (Cardiology)  Extended Emergency Contact Information Primary Emergency Contact: [name],Patricia Address: [phone] [place]          [place] [address] [name] of [place] Phone: [phone] Mobile Phone: [phone] Relation: Sister Secondary Emergency Contact: [name],[name]  [name] of [place] Phone: [phone] Mobile Phone: [phone] Relation: Niece  Code Status:  DNR Goals of care: Advanced Directive information    09/07/2022   12:11 PM  Advanced Directives  Does Patient Have a Medical Advance Directive? Yes  Type of Paramedic of [place];Living will;Out of facility DNR (pink MOST or yellow form)  Does patient want to make changes to medical advance directive? No - Patient declined  Copy of [place] in Chart? Yes - validated most recent copy scanned in chart (See row information)  Pre-existing out of facility DNR order (yellow form or pink MOST form) Pink MOST form placed in chart (order not valid for inpatient use)     Chief Complaint  Patient presents with  . Acute Visit    Patient is being seen for compression fractures    HPI:  Pt is a 88 y.o. female seen today for an acute visit for Multiple Compression fractures and pain    Past Medical History:  Diagnosis Date  . Anxiety and depression   . Aortic stenosis    a. 03/2014: Mild aortic stenosis by valve area and mean gradient though appeared more visually consistent with moderate AS.   [name] Kitchen CAD (coronary artery disease)    a. s/p prior PCI of D2 in 1992 b. stent to RCA in 2005  . Diverticula of colon   . GERD (gastroesophageal reflux disease)   . HTN (hypertension)   . Hypercholesteremia   . Lateral  myocardial infarction ([place]) 1992  . Osteoarthritis   . Osteopenia   . Scoliosis    Past Surgical History:  Procedure Laterality Date  . ABDOMINAL HYSTERECTOMY    . APPENDECTOMY    . CORONARY ANGIOPLASTY    . TONSILLECTOMY      Allergies  Allergen Reactions  . Latex Other (See Comments)    Unknown reaction - listed on [facility] 02/10/22  . Nifedipine Other (See Comments)    Dark Urine; can only take orange tablet, allergic to yellow-brown tablet  . Tetanus Toxoids Other (See Comments)    Unknown reaction  . Tetanus-Diphtheria Toxoids Td Other (See Comments)    Unknown reaction  . Adhesive [Tape] Rash    Outpatient Encounter Medications as of 09/07/2022  Medication Sig  . acetaminophen (TYLENOL) 500 MG tablet Take 1,000 mg by mouth 2 (two) times daily.  [name] Kitchen amLODipine (NORVASC) 5 MG tablet Take 5 mg by mouth daily.  [name] Kitchen atorvastatin (LIPITOR) 10 MG tablet Take 10 mg by mouth at bedtime.  . cetirizine (ZYRTEC) 10 MG tablet Take 10 mg by mouth every morning.  . cholecalciferol (VITAMIN D) 25 MCG (1000 UNIT) tablet Take 2,000 Units by mouth every evening.  . ferrous sulfate 325 (65 FE) MG tablet Take 1 tablet (325 mg total) by mouth daily with breakfast.  . folic acid (FOLVITE) 1 MG tablet Take 1 tablet (1 mg total) by mouth daily.  . hydrochlorothiazide (HYDRODIURIL) 25 MG tablet Take 25 mg by  mouth in the morning.  [name] Kitchen losartan (COZAAR) 100 MG tablet Take 100 mg by mouth at bedtime.  . metoprolol succinate (TOPROL-XL) 50 MG 24 hr tablet Take 50 mg by mouth every evening. Take with or immediately following a meal.  . nitroGLYCERIN (NITROSTAT) 0.4 MG SL tablet Place 1 tablet (0.4 mg total) under the tongue every 5 (five) minutes as needed for chest pain.  [name] Kitchen OLANZapine (ZYPREXA) 2.5 MG tablet Take 2.5 mg by mouth at bedtime.  . pantoprazole (PROTONIX) 40 MG tablet Take 1 tablet (40 mg total) by mouth daily.  . potassium chloride (KLOR-CON M) 10 MEQ tablet Take 10 mEq by mouth daily.  [name] Kitchen senna  (SENOKOT) 8.6 MG TABS tablet Take 1 tablet by mouth in the morning.  . sertraline (ZOLOFT) 25 MG tablet Take 25 mg by mouth at bedtime.   No facility-administered encounter medications on file as of 09/07/2022.    Review of Systems  Immunization History  Administered Date(s) Administered  . Influenza Split 05/13/2009, 05/18/2010, 09/29/2011, 09/05/2012, 09/03/2013  . Influenza, High Dose Seasonal PF 09/09/2015, 08/30/2017  . Influenza, Quadrivalent, Recombinant, Inj, Pf 08/19/2018, 09/29/2019  . Influenza-Unspecified 10/18/2021  . Moderna Covid-19 Vaccine Bivalent Booster 65yr & up 09/14/2021  . Moderna SARS-COV2 Booster Vaccination 06/01/2021  . Moderna Sars-Covid-2 Vaccination 12/29/2019, 01/26/2020  . Pneumococcal Conjugate-13 07/08/2013  . Pneumococcal Polysaccharide-23 06/29/2006, 05/13/2009, 05/18/2010, 05/25/2011  . Td 05/13/2009, 05/18/2010, 05/25/2011  . Zoster Recombinat (Shingrix) 08/24/2017, 10/31/2017  . Zoster, Live 08/22/2008, 08/24/2008, 05/13/2009, 05/18/2010, 05/25/2011   Pertinent  Health Maintenance Due  Topic Date Due  . INFLUENZA VACCINE  07/25/2022  . DEXA SCAN  Completed      12/14/2021   12:17 PM 02/10/2022   10:00 PM 02/11/2022    8:00 AM 03/20/2022   10:22 AM 09/07/2022   12:11 PM  Fall Risk  Falls in the past year?    1 0  Was there an injury with Fall?    0 0  Fall Risk Category Calculator    1 0  Fall Risk Category    Low Low  Patient Fall Risk Level Low fall risk Moderate fall risk Moderate fall risk Moderate fall risk Low fall risk  Patient at Risk for Falls Due to    History of fall(s);Impaired balance/gait;Mental status change;Other (Comment) No Fall Risks  Patient at Risk for Falls Due to - Comments    vascular dementia   Fall risk Follow up    Falls evaluation completed;Education provided;Falls prevention discussed Falls evaluation completed   Functional Status Survey:    Vitals:   09/07/22 1210  BP: (!) 157/90  Pulse: 64  Resp: (!)  22  Temp: 98.8 F (37.1 C)  SpO2: 93%  Weight: 126 lb (57.2 kg)  Height: 5' (1.524 m)   Body mass index is 24.61 kg/m. Physical Exam  Labs reviewed: Recent Labs    02/10/22 1303 02/11/22 0108 06/05/22 0000  NA 137 138 142  K 3.4* 3.0* 3.9  CL 104 106 107  CO2 24 24 29*  GLUCOSE 98 110*  --   BUN '16 11 16  '$ CREATININE 0.74 0.64 0.5  CALCIUM 8.5* 8.3* 8.7   Recent Labs    11/24/21 0000 02/09/22 0000 02/10/22 1303  AST '16 23 28  '$ ALT '12 22 25  '$ ALKPHOS 106 90 97  BILITOT  --   --  0.5  PROT  --   --  6.3*  ALBUMIN 3.2* 3.7 3.2*   Recent Labs  02/10/22 1303 02/11/22 0108 02/20/22 0000 03/09/22 0000 06/05/22 0000  WBC 4.6 5.8 3.9 3.7 4.2  NEUTROABS 2.8 4.1 2,071.00 1,550.00  --   HGB 6.1* 8.0* 9.1* 10.7* 12.5  HCT 21.8* 26.6* 31* 36 39  MCV 82.0 80.4  --   --   --   PLT 253 216 291 266 209   Lab Results  Component Value Date   TSH 2.90 11/24/2021   No results found for: "HGBA1C" Lab Results  Component Value Date   CHOL 86 01/23/2022   HDL 43 01/23/2022   LDLCALC 27 01/23/2022   TRIG 77 01/23/2022   CHOLHDL 1.9 09/17/2020    Significant Diagnostic Results in last 30 days:  No results found.  Assessment/Plan    Family/ staff Communication: ***  Labs/tests ordered:  ***

## 2022-09-08 ENCOUNTER — Emergency Department (HOSPITAL_COMMUNITY)
Admission: EM | Admit: 2022-09-08 | Discharge: 2022-09-08 | Disposition: A | Discharge status: Home / Self Care | Payer: Medicare Other | Attending: Emergency Medicine | Admitting: Emergency Medicine

## 2022-09-08 ENCOUNTER — Emergency Department (HOSPITAL_COMMUNITY): Payer: Medicare Other

## 2022-09-08 ENCOUNTER — Encounter (HOSPITAL_COMMUNITY): Payer: Self-pay

## 2022-09-08 DIAGNOSIS — I10 Essential (primary) hypertension: Secondary | ICD-10-CM | POA: Diagnosis not present

## 2022-09-08 DIAGNOSIS — W19XXXA Unspecified fall, initial encounter: Secondary | ICD-10-CM | POA: Diagnosis not present

## 2022-09-08 DIAGNOSIS — R1012 Left upper quadrant pain: Secondary | ICD-10-CM | POA: Insufficient documentation

## 2022-09-08 DIAGNOSIS — K449 Diaphragmatic hernia without obstruction or gangrene: Secondary | ICD-10-CM | POA: Diagnosis not present

## 2022-09-08 DIAGNOSIS — R0902 Hypoxemia: Secondary | ICD-10-CM | POA: Diagnosis not present

## 2022-09-08 DIAGNOSIS — I4891 Unspecified atrial fibrillation: Secondary | ICD-10-CM | POA: Diagnosis not present

## 2022-09-08 DIAGNOSIS — S3991XA Unspecified injury of abdomen, initial encounter: Secondary | ICD-10-CM | POA: Diagnosis not present

## 2022-09-08 DIAGNOSIS — M6281 Muscle weakness (generalized): Secondary | ICD-10-CM | POA: Diagnosis not present

## 2022-09-08 DIAGNOSIS — R0781 Pleurodynia: Secondary | ICD-10-CM | POA: Insufficient documentation

## 2022-09-08 DIAGNOSIS — K5730 Diverticulosis of large intestine without perforation or abscess without bleeding: Secondary | ICD-10-CM | POA: Diagnosis not present

## 2022-09-08 DIAGNOSIS — D320 Benign neoplasm of cerebral meninges: Secondary | ICD-10-CM | POA: Insufficient documentation

## 2022-09-08 DIAGNOSIS — Z9181 History of falling: Secondary | ICD-10-CM | POA: Diagnosis not present

## 2022-09-08 DIAGNOSIS — Z91040 Latex allergy status: Secondary | ICD-10-CM | POA: Diagnosis not present

## 2022-09-08 DIAGNOSIS — R9082 White matter disease, unspecified: Secondary | ICD-10-CM | POA: Diagnosis not present

## 2022-09-08 DIAGNOSIS — R531 Weakness: Secondary | ICD-10-CM | POA: Diagnosis not present

## 2022-09-08 DIAGNOSIS — R41841 Cognitive communication deficit: Secondary | ICD-10-CM | POA: Diagnosis not present

## 2022-09-08 DIAGNOSIS — M545 Low back pain, unspecified: Secondary | ICD-10-CM | POA: Diagnosis not present

## 2022-09-08 DIAGNOSIS — S22060A Wedge compression fracture of T7-T8 vertebra, initial encounter for closed fracture: Secondary | ICD-10-CM | POA: Diagnosis not present

## 2022-09-08 DIAGNOSIS — S2242XA Multiple fractures of ribs, left side, initial encounter for closed fracture: Secondary | ICD-10-CM | POA: Diagnosis not present

## 2022-09-08 DIAGNOSIS — R519 Headache, unspecified: Secondary | ICD-10-CM | POA: Diagnosis not present

## 2022-09-08 DIAGNOSIS — S2243XA Multiple fractures of ribs, bilateral, initial encounter for closed fracture: Secondary | ICD-10-CM | POA: Diagnosis not present

## 2022-09-08 DIAGNOSIS — R0789 Other chest pain: Secondary | ICD-10-CM | POA: Diagnosis not present

## 2022-09-08 DIAGNOSIS — M542 Cervicalgia: Secondary | ICD-10-CM | POA: Diagnosis not present

## 2022-09-08 DIAGNOSIS — Z743 Need for continuous supervision: Secondary | ICD-10-CM | POA: Diagnosis not present

## 2022-09-08 DIAGNOSIS — I358 Other nonrheumatic aortic valve disorders: Secondary | ICD-10-CM | POA: Diagnosis not present

## 2022-09-08 DIAGNOSIS — R410 Disorientation, unspecified: Secondary | ICD-10-CM | POA: Diagnosis not present

## 2022-09-08 DIAGNOSIS — R4189 Other symptoms and signs involving cognitive functions and awareness: Secondary | ICD-10-CM | POA: Diagnosis not present

## 2022-09-08 DIAGNOSIS — S22080A Wedge compression fracture of T11-T12 vertebra, initial encounter for closed fracture: Secondary | ICD-10-CM | POA: Diagnosis not present

## 2022-09-08 LAB — CBC WITH DIFFERENTIAL/PLATELET
Abs Immature Granulocytes: 0.03 10*3/uL (ref 0.00–0.07)
Basophils Absolute: 0 10*3/uL (ref 0.0–0.1)
Basophils Relative: 0 %
Eosinophils Absolute: 0.1 10*3/uL (ref 0.0–0.5)
Eosinophils Relative: 2 %
HCT: 43.2 % (ref 36.0–46.0)
Hemoglobin: 14.1 g/dL (ref 12.0–15.0)
Immature Granulocytes: 0 %
Lymphocytes Relative: 11 %
Lymphs Abs: 0.8 10*3/uL (ref 0.7–4.0)
MCH: 31.2 pg (ref 26.0–34.0)
MCHC: 32.6 g/dL (ref 30.0–36.0)
MCV: 95.6 fL (ref 80.0–100.0)
Monocytes Absolute: 0.8 10*3/uL (ref 0.1–1.0)
Monocytes Relative: 11 %
Neutro Abs: 5 10*3/uL (ref 1.7–7.7)
Neutrophils Relative %: 76 %
Platelets: 220 10*3/uL (ref 150–400)
RBC: 4.52 MIL/uL (ref 3.87–5.11)
RDW: 13.7 % (ref 11.5–15.5)
WBC: 6.7 10*3/uL (ref 4.0–10.5)
nRBC: 0 % (ref 0.0–0.2)

## 2022-09-08 LAB — BASIC METABOLIC PANEL
Anion gap: 8 (ref 5–15)
BUN: 10 mg/dL (ref 8–23)
CO2: 25 mmol/L (ref 22–32)
Calcium: 8.9 mg/dL (ref 8.9–10.3)
Chloride: 101 mmol/L (ref 98–111)
Creatinine, Ser: 0.38 mg/dL — ABNORMAL LOW (ref 0.44–1.00)
GFR, Estimated: >60 mL/min (ref >60)
Glucose, Bld: 106 mg/dL — ABNORMAL HIGH (ref 70–99)
Potassium: 3.2 mmol/L — ABNORMAL LOW (ref 3.5–5.1)
Sodium: 134 mmol/L — ABNORMAL LOW (ref 135–145)

## 2022-09-08 MED ORDER — IOHEXOL 300 MG/ML  SOLN
80.0000 mL | Freq: Once | INTRAMUSCULAR | Status: AC | PRN
  Administered 2022-09-08: 80 mL via INTRAVENOUS

## 2022-09-08 MED ORDER — METOPROLOL SUCCINATE ER 50 MG PO TB24: 50.0000 mg | ORAL_TABLET | Freq: Once | ORAL | Status: DC

## 2022-09-08 MED ORDER — AMLODIPINE BESYLATE 5 MG PO TABS: 5.0000 mg | ORAL_TABLET | Freq: Once | ORAL | Status: DC

## 2022-09-08 MED ORDER — MORPHINE SULFATE (PF) 2 MG/ML IV SOLN
2.0000 mg | Freq: Once | INTRAVENOUS | Status: AC
  Administered 2022-09-08: 2 mg via INTRAVENOUS
  Filled 2022-09-08: qty 1

## 2022-09-08 MED ORDER — ONDANSETRON HCL 4 MG/2ML IJ SOLN
4.0000 mg | Freq: Once | INTRAMUSCULAR | Status: AC
  Administered 2022-09-08: 4 mg via INTRAVENOUS
  Filled 2022-09-08: qty 2

## 2022-09-08 MED ORDER — SODIUM CHLORIDE (PF) 0.9 % IJ SOLN
INTRAMUSCULAR | Status: AC
  Filled 2022-09-08: qty 50

## 2022-09-08 MED ORDER — HYDROCHLOROTHIAZIDE 25 MG PO TABS
25.0000 mg | ORAL_TABLET | Freq: Once | ORAL | Status: AC
  Administered 2022-09-08: 25 mg via ORAL
  Filled 2022-09-08: qty 1

## 2022-09-08 MED ORDER — OXYCODONE-ACETAMINOPHEN 5-325 MG PO TABS: 1.0000 | ORAL_TABLET | Freq: Four times a day (QID) | ORAL | 0 refills | Status: DC | PRN

## 2022-09-08 MED ORDER — LOSARTAN POTASSIUM 25 MG PO TABS: 100.0000 mg | ORAL_TABLET | Freq: Once | ORAL | Status: DC

## 2022-09-08 NOTE — ED Provider Notes (Signed)
[facility] DEPT Provider Note   CSN: [id number] Arrival date & time: 09/08/22  0341     History  Chief Complaint  Patient presents with   [name] is a 88 y.o. female.  Patient was referred to the emergency department from nursing home because of rib pain.  Patient reportedly had an unwitnessed fall at the nursing home 2 days ago.  She had x-rays performed at the nursing home that were reportedly negative but patient has persistent pain.  She is complaining of bilateral anterior rib pain that significantly worsens if she moves.       Home Medications Prior to Admission medications   Medication Sig Start Date End Date Taking? Authorizing Provider  acetaminophen (TYLENOL) 500 MG tablet Take 1,000 mg by mouth 2 (two) times daily.    [provider]  amLODipine (NORVASC) 5 MG tablet Take 5 mg by mouth daily.    [provider]  atorvastatin (LIPITOR) 10 MG tablet Take 10 mg by mouth at bedtime.    [provider]  cetirizine (ZYRTEC) 10 MG tablet Take 10 mg by mouth every morning.    [provider]  cholecalciferol (VITAMIN D) 25 MCG (1000 UNIT) tablet Take 2,000 Units by mouth every evening.    [provider]  ferrous sulfate 325 (65 FE) MG tablet Take 1 tablet (325 mg total) by mouth daily with breakfast. 02/11/22   Thurnell Lose, MD  folic acid (FOLVITE) 1 MG tablet Take 1 tablet (1 mg total) by mouth daily. 02/12/22   Thurnell Lose, MD  hydrochlorothiazide (HYDRODIURIL) 25 MG tablet Take 25 mg by mouth in the morning.    [provider]  losartan (COZAAR) 100 MG tablet Take 100 mg by mouth at bedtime.    [provider]  metoprolol succinate (TOPROL-XL) 50 MG 24 hr tablet Take 50 mg by mouth every evening. Take with or immediately following a meal.    [provider]  nitroGLYCERIN (NITROSTAT) 0.4 MG SL tablet Place 1 tablet (0.4 mg total) under the tongue  every 5 (five) minutes as needed for chest pain. 09/23/20   Martinique, Peter M, MD  OLANZapine (ZYPREXA) 2.5 MG tablet Take 2.5 mg by mouth at bedtime.    [provider]  oxyCODONE-acetaminophen (PERCOCET) 5-325 MG tablet Take 1 tablet by mouth every 6 (six) hours as needed for severe pain. 09/08/22  Yes Quintyn Dombek, Gwenyth Allegra, MD  pantoprazole (PROTONIX) 40 MG tablet Take 1 tablet (40 mg total) by mouth daily. 02/12/22   Thurnell Lose, MD  potassium chloride (KLOR-CON M) 10 MEQ tablet Take 10 mEq by mouth daily.    [provider]  senna (SENOKOT) 8.6 MG TABS tablet Take 1 tablet by mouth in the morning.    [provider]  sertraline (ZOLOFT) 25 MG tablet Take 25 mg by mouth at bedtime.    [provider]      Allergies    Latex, Nifedipine, Tetanus toxoids, Tetanus-diphtheria toxoids td, and Adhesive [tape]    Review of Systems   Review of Systems  Physical Exam Updated Vital Signs BP (!) 183/80   Pulse 64   Temp 98 F (36.7 C) (Oral)   Resp (!) 22   SpO2 92%  Physical Exam Vitals and nursing note reviewed.  Constitutional:      General: She is not in acute distress.    Appearance: She is well-developed.  HENT:  Head: Normocephalic and atraumatic.     Mouth/Throat:     Mouth: Mucous membranes are moist.  Eyes:     General: Vision grossly intact. Gaze aligned appropriately.     Extraocular Movements: Extraocular movements intact.     Conjunctiva/sclera: Conjunctivae normal.  Cardiovascular:     Rate and Rhythm: Normal rate and regular rhythm.     Pulses: Normal pulses.     Heart sounds: Normal heart sounds, S1 normal and S2 normal. No murmur heard.    No friction rub. No gallop.  Pulmonary:     Effort: Pulmonary effort is normal. No respiratory distress.     Breath sounds: Normal breath sounds.  Chest:     Chest wall: Tenderness present. No deformity.    Abdominal:     General: Bowel sounds are normal.     Palpations: Abdomen  is soft.     Tenderness: There is abdominal tenderness in the left upper quadrant. There is no guarding or rebound.     Hernia: No hernia is present.  Musculoskeletal:        General: No swelling.     Cervical back: Full passive range of motion without pain, normal range of motion and neck supple. No spinous process tenderness or muscular tenderness. Normal range of motion.     Right lower leg: No edema.     Left lower leg: No edema.  Skin:    General: Skin is warm and dry.     Capillary Refill: Capillary refill takes less than 2 seconds.     Findings: No ecchymosis, erythema, rash or wound.  Neurological:     General: No focal deficit present.     Mental Status: She is alert and oriented to person, place, and time.     GCS: GCS eye subscore is 4. GCS verbal subscore is 5. GCS motor subscore is 6.     Cranial Nerves: Cranial nerves 2-12 are intact.     Sensory: Sensation is intact.     Motor: Motor function is intact.     Coordination: Coordination is intact.  Psychiatric:        Attention and Perception: Attention normal.        Mood and Affect: Mood normal.        Speech: Speech normal.        Behavior: Behavior normal.     ED Results / Procedures / Treatments   Labs (all labs ordered are listed, but only abnormal results are displayed) Labs Reviewed  BASIC METABOLIC PANEL - Abnormal; Notable for the following components:      Result Value   Sodium 134 (*)    Potassium 3.2 (*)    Glucose, Bld 106 (*)    Creatinine, Ser 0.38 (*)    All other components within normal limits  CBC WITH DIFFERENTIAL/PLATELET    EKG None  Radiology CT CHEST ABDOMEN PELVIS W CONTRAST  Result Date: 09/08/2022 CLINICAL DATA:  Left rib pain after unwitnessed fall 2 days ago. Back pain. EXAM: CT CHEST, ABDOMEN, AND PELVIS WITH CONTRAST TECHNIQUE: Multidetector CT imaging of the chest, abdomen and pelvis was performed following the standard protocol during bolus administration of intravenous  contrast. RADIATION DOSE REDUCTION: This exam was performed according to the departmental dose-optimization program which includes automated exposure control, adjustment of the mA and/or kV according to patient size and/or use of iterative reconstruction technique. CONTRAST:  89m OMNIPAQUE IOHEXOL 300 MG/ML  SOLN COMPARISON:  Chest CT 05/02/2021 FINDINGS: CT CHEST FINDINGS  Cardiovascular: Heart size upper normal. No substantial pericardial effusion. Coronary artery calcification is evident. Aortic valve calcification evident. Moderate atherosclerotic calcification is noted in the wall of the thoracic aorta. No large central pulmonary embolus. Mediastinum/Nodes: No mediastinal lymphadenopathy. There is no hilar lymphadenopathy. Moderate to large hiatal hernia noted. There is no axillary lymphadenopathy. Lungs/Pleura: Scattered areas of peripheral clustered tree-in-bud micro nodularity identified in both lungs, suggesting sequelae of atypical infection. Left lower lobe compressive atelectasis noted adjacent to the hiatal hernia. No overtly suspicious pulmonary nodule or mass. No focal airspace consolidation. There is no evidence of pleural effusion. Musculoskeletal: Nonacute rib fractures are identified posteriorly. Nonacute fracture nonunion identified posterior right ninth rib. Thoracic spine compression fractures are noted at T3, T7, and T11. CT ABDOMEN PELVIS FINDINGS Hepatobiliary: No suspicious focal abnormality within the liver parenchyma. There is no evidence for gallstones, gallbladder wall thickening, or pericholecystic fluid. No intrahepatic or extrahepatic biliary dilation. Pancreas: No focal mass lesion. No dilatation of the main duct. No intraparenchymal cyst. No peripancreatic edema. Spleen: No splenomegaly. No focal mass lesion. Adrenals/Urinary Tract: No adrenal nodule or mass. Kidneys unremarkable. No evidence for hydroureter. The urinary bladder appears normal for the degree of distention.  Stomach/Bowel: Large hiatal hernia contains 50-75% of the stomach. Duodenum is normally positioned as is the ligament of Treitz. No small bowel wall thickening. No small bowel dilatation. The terminal ileum is normal. The appendix is not well visualized, but there is no edema or inflammation in the region of the cecum. No gross colonic mass. No colonic wall thickening. Diverticuli are seen scattered along the entire length of the colon without CT findings of diverticulitis. Diverticular disease is most advanced in the sigmoid segment. Vascular/Lymphatic: There is advanced atherosclerotic calcification of the abdominal aorta. Fusiform dilatation of the infrarenal abdominal aorta up to 3.0 cm diameter evident. There is no gastrohepatic or hepatoduodenal ligament lymphadenopathy. No retroperitoneal or mesenteric lymphadenopathy. No pelvic sidewall lymphadenopathy. Reproductive: The uterus is surgically absent. There is no adnexal mass. Other: No intraperitoneal free fluid. Musculoskeletal: No worrisome lytic or sclerotic osseous abnormality. Thoracolumbar scoliosis evident. IMPRESSION: 1. No acute findings in the chest, abdomen, or pelvis. 2. Scattered areas of peripheral clustered tree-in-bud micro nodularity in both lungs, suggesting sequelae of atypical infection. Findings are improved compared to chest CT 05/02/2021. 3. Moderate to large hiatal hernia contains 50-75% of the stomach. 4. 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 5. Thoracic spine compression fractures at T3, T7, and T11. 6. Fusiform dilatation of the infrarenal abdominal aorta up to 3.0 cm diameter. Recommend follow-up ultrasound every 3 years. This recommendation follows ACR consensus guidelines: White Paper of the ACR Incidental Findings Committee II on Vascular Findings. J Am Coll Radiol 2013; 10:789-794. 7. Colonic diverticulosis without diverticulitis. 8. Aortic Atherosclerosis (ICD10-I70.0).  Electronically Signed   By: Misty Stanley M.D.   On: 09/08/2022 06:45   CT CERVICAL SPINE WO CONTRAST  Result Date: 09/08/2022 CLINICAL DATA:  88 year old female status post unwitnessed fall 2 days ago. Rib pain. EXAM: CT CERVICAL SPINE WITHOUT CONTRAST TECHNIQUE: Multidetector CT imaging of the cervical spine was performed without intravenous contrast. Multiplanar CT image reconstructions were also generated. RADIATION DOSE REDUCTION: This exam was performed according to the departmental dose-optimization program which includes automated exposure control, adjustment of the mA and/or kV according to patient size and/or use of iterative reconstruction technique. COMPARISON:  Cervical spine CT 12/15/2020. FINDINGS: Alignment: Mildly exaggerated cervical lordosis has not significantly changed since 2021, with  mild chronic anterolisthesis at both C2-C3 and C6-C7. Subtle chronic anterolisthesis at C7-T1. Bilateral posterior element alignment is within normal limits. Skull base and vertebrae: Osteopenia. Visualized skull base is intact. No atlanto-occipital dissociation. C1 and C2 appear stable. Stable chronic C6 mild vertebral loss of height. No acute osseous abnormality identified. Soft tissues and spinal canal: No prevertebral fluid or swelling. No visible canal hematoma. Stable visible noncontrast neck soft tissues, calcified carotid atherosclerosis. Evidence of chronic left vertebral artery tortuosity. Disc levels: Advanced chronic cervical spine degeneration appears stable since 2021. Mild multifactorial spinal stenosis suspected at C3-C4. Upper chest: Chest CT today is reported separately. IMPRESSION: 1. No acute traumatic injury identified in the cervical spine. 2. Osteopenia and advanced chronic cervical spine degeneration appear stable since 2021. 3. Chest CT today is reported separately. Electronically Signed   By: Genevie Ann M.D.   On: 09/08/2022 06:44   CT HEAD WO CONTRAST (5MM)  Result Date:  09/08/2022 CLINICAL DATA:  88 year old female status post unwitnessed fall 2 days ago. Rib pain. EXAM: CT HEAD WITHOUT CONTRAST TECHNIQUE: Contiguous axial images were obtained from the base of the skull through the vertex without intravenous contrast. RADIATION DOSE REDUCTION: This exam was performed according to the departmental dose-optimization program which includes automated exposure control, adjustment of the mA and/or kV according to patient size and/or use of iterative reconstruction technique. COMPARISON:  Head CT 05/02/2021. FINDINGS: Brain: Stable cerebral volume. No midline shift, ventriculomegaly, intracranial hemorrhage or evidence of cortically based acute infarction. Patchy mild to moderate for age bilateral cerebral white matter hypodensity appears stable from last year. Subtle chronic rounded and mildly hyperdense right posterior para falcine meningioma is up to 22 mm long axis (coronal image 48) but not significantly changed since 2021. No associated cerebral edema, and no significant regional mass effect. Vascular: Calcified atherosclerosis at the skull base. No suspicious intracranial vascular hyperdensity. Dominant appearing distal left vertebral artery. Skull: Stable.  No acute osseous abnormality identified. Sinuses/Orbits: Visualized paranasal sinuses and mastoids are stable and well aerated. Other: No acute orbit or scalp soft tissue finding. There is some chronic scalp vessel calcified atherosclerosis. IMPRESSION: 1. No acute intracranial abnormality or acute traumatic injury identified. 2. 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 No cerebral edema, suggesting this is clinically silent. 3. Stable mild to moderate for age cerebral white matter changes, most commonly due to chronic small vessel disease. Electronically Signed   By: Genevie Ann M.D.   On: 09/08/2022 06:40   DG Ribs Bilateral W/Chest  Result Date:  09/08/2022 CLINICAL DATA:  88 year old female status post unwitnessed fall. Upper back pain. EXAM: BILATERAL RIBS AND CHEST - 4+ VIEW COMPARISON:  Rib radiographs 12/14/2021.  Chest CT 05/02/2021. FINDINGS: Semi upright AP view of the chest at 0435 hours. Stable cardiomegaly and mediastinal contours. Chronic sternotomy. Moderate to large chronic gastric hiatal hernia better demonstrated by CT. Pronounced Calcified aortic atherosclerosis. Lower lung volumes compared to last year. Vague increased bibasilar opacity with no air bronchograms. No pneumothorax. Pulmonary vascularity appears stable. Osteopenia. Multiple chronic left lateral rib fractures, demonstrated by CT last year. No acute displaced rib fracture identified. Other visible osseous structures appear grossly intact, chronic thoracolumbar scoliosis. Paucity of bowel gas in the upper abdomen. IMPRESSION: 1. No acute displaced rib fracture identified. Multiple previous left lateral rib fractures as seen by CT last year. 2. Chronic cardiomegaly, hiatal hernia, Aortic Atherosclerosis (ICD10-I70.0). 3. Lower lung volumes with  vague bibasilar opacity, favor atelectasis. Electronically Signed   By: Genevie Ann M.D.   On: 09/08/2022 05:11    Procedures Procedures    Medications Ordered in ED Medications  amLODipine (NORVASC) tablet 5 mg (0 mg Oral Hold 09/08/22 0632)  losartan (COZAAR) tablet 100 mg (0 mg Oral Hold 09/08/22 0632)  metoprolol succinate (TOPROL-XL) 24 hr tablet 50 mg (0 mg Oral Hold 09/08/22 0632)  morphine (PF) 2 MG/ML injection 2 mg (2 mg Intravenous Given 09/08/22 0542)  ondansetron (ZOFRAN) injection 4 mg (4 mg Intravenous Given 09/08/22 0542)  iohexol (OMNIPAQUE) 300 MG/ML solution 80 mL (80 mLs Intravenous Contrast Given 09/08/22 0559)  sodium chloride (PF) 0.9 % injection (  Given by Other 09/08/22 0600)  morphine (PF) 2 MG/ML injection 2 mg (2 mg Intravenous Given 09/08/22 0635)  hydrochlorothiazide (HYDRODIURIL) tablet 25 mg (25 mg Oral  Given 09/08/22 0867)    ED Course/ Medical Decision Making/ A&P                           Medical Decision Making Amount and/or Complexity of Data Reviewed Labs: ordered. Radiology: ordered.  Risk Prescription drug management.   Presents to the emergency department for bilateral rib pain in the setting of a fall 2 days ago.  Patient sent from nursing home for further examination after x-rays were negative at the nursing home.  Patient indicates bilateral anterior and posterior rib area pain with movement upon arrival.  She has achieved very good analgesia with 2 small doses of morphine.  Initial x-ray of chest and ribs did not show any fractures or lung pathology.  Patient sent for CT of head, cervical spine, chest, abdomen, pelvis.  Patient does have multiple compression fractures in the thoracic spine, no other findings.  Results discussed with her sister, Leanor Kail.  Mardene Celeste indicates that she knows that the patient's back has been described as "terrible" from prior falls.  Unclear if these compression fractures are old or new.  As she has gotten fairly good analgesia, however, will attempt discharge back to nursing home with analgesia.        Final Clinical Impression(s) / ED Diagnoses Final diagnoses:  Fall, initial encounter    Rx / DC Orders ED Discharge Orders          Ordered    oxyCODONE-acetaminophen (PERCOCET) 5-325 MG tablet  Every 6 hours PRN        09/08/22 0704              Orpah Greek, MD 09/08/22 [phone]

## 2022-09-08 NOTE — ED Notes (Signed)
[facility] called  

## 2022-09-08 NOTE — ED Triage Notes (Signed)
Patient arrived with complaints of a left rib pain after an unwitnessed fall two days ago

## 2022-09-08 NOTE — Discharge Instructions (Signed)
Activity as tolerated.  Pain medicine as needed for pain.  Return to ER for fever, cough, difficulty breathing.

## 2022-09-09 ENCOUNTER — Emergency Department (HOSPITAL_COMMUNITY)
Admission: EM | Admit: 2022-09-09 | Discharge: 2022-09-10 | Disposition: A | Discharge status: Skilled Nursing Facility | Payer: Medicare Other | Attending: Emergency Medicine | Admitting: Emergency Medicine

## 2022-09-09 ENCOUNTER — Encounter (HOSPITAL_COMMUNITY): Payer: Self-pay

## 2022-09-09 ENCOUNTER — Telehealth: Payer: Self-pay | Admitting: Adult Health

## 2022-09-09 ENCOUNTER — Emergency Department (HOSPITAL_COMMUNITY): Payer: Medicare Other

## 2022-09-09 ENCOUNTER — Other Ambulatory Visit: Payer: Self-pay

## 2022-09-09 DIAGNOSIS — M542 Cervicalgia: Secondary | ICD-10-CM | POA: Diagnosis not present

## 2022-09-09 DIAGNOSIS — M545 Low back pain, unspecified: Secondary | ICD-10-CM | POA: Diagnosis not present

## 2022-09-09 DIAGNOSIS — R011 Cardiac murmur, unspecified: Secondary | ICD-10-CM | POA: Diagnosis not present

## 2022-09-09 DIAGNOSIS — I2510 Atherosclerotic heart disease of native coronary artery without angina pectoris: Secondary | ICD-10-CM | POA: Insufficient documentation

## 2022-09-09 DIAGNOSIS — M549 Dorsalgia, unspecified: Secondary | ICD-10-CM | POA: Diagnosis not present

## 2022-09-09 DIAGNOSIS — M419 Scoliosis, unspecified: Secondary | ICD-10-CM | POA: Diagnosis not present

## 2022-09-09 DIAGNOSIS — Z91040 Latex allergy status: Secondary | ICD-10-CM | POA: Diagnosis not present

## 2022-09-09 DIAGNOSIS — M546 Pain in thoracic spine: Secondary | ICD-10-CM | POA: Diagnosis not present

## 2022-09-09 DIAGNOSIS — S299XXA Unspecified injury of thorax, initial encounter: Secondary | ICD-10-CM | POA: Diagnosis not present

## 2022-09-09 DIAGNOSIS — R451 Restlessness and agitation: Secondary | ICD-10-CM

## 2022-09-09 DIAGNOSIS — I10 Essential (primary) hypertension: Secondary | ICD-10-CM | POA: Diagnosis not present

## 2022-09-09 DIAGNOSIS — R0602 Shortness of breath: Secondary | ICD-10-CM | POA: Diagnosis not present

## 2022-09-09 DIAGNOSIS — Z79899 Other long term (current) drug therapy: Secondary | ICD-10-CM | POA: Insufficient documentation

## 2022-09-09 DIAGNOSIS — F0390 Unspecified dementia without behavioral disturbance: Secondary | ICD-10-CM | POA: Insufficient documentation

## 2022-09-09 DIAGNOSIS — W19XXXA Unspecified fall, initial encounter: Secondary | ICD-10-CM | POA: Diagnosis not present

## 2022-09-09 MED ORDER — LORAZEPAM 0.5 MG PO TABS: 0.5000 mg | ORAL_TABLET | Freq: Two times a day (BID) | ORAL | 0 refills | Status: DC | PRN

## 2022-09-09 MED ORDER — MORPHINE SULFATE (PF) 4 MG/ML IV SOLN
4.0000 mg | Freq: Once | INTRAVENOUS | Status: AC
  Administered 2022-09-10: 4 mg via INTRAVENOUS
  Filled 2022-09-09: qty 1

## 2022-09-09 MED ORDER — ONDANSETRON HCL 4 MG/2ML IJ SOLN
4.0000 mg | Freq: Once | INTRAMUSCULAR | Status: AC
  Administered 2022-09-10: 4 mg via INTRAVENOUS
  Filled 2022-09-09: qty 2

## 2022-09-09 NOTE — ED Provider Notes (Signed)
[name] DEPT Provider Note   CSN: [id number] Arrival date & time: 09/09/22  2211     History {Add pertinent medical, surgical, social history, OB history to HPI:1} Chief Complaint  Patient presents with  . Fall    [name] is a 88 y.o. female.  HPI     Home Medications Prior to Admission medications   Medication Sig Start Date End Date Taking? Authorizing Provider  acetaminophen (TYLENOL) 500 MG tablet Take 1,000 mg by mouth 2 (two) times daily.    [provider]  amLODipine (NORVASC) 5 MG tablet Take 5 mg by mouth daily.    [provider]  atorvastatin (LIPITOR) 10 MG tablet Take 10 mg by mouth at bedtime.    [provider]  cetirizine (ZYRTEC) 10 MG tablet Take 10 mg by mouth every morning.    [provider]  cholecalciferol (VITAMIN D) 25 MCG (1000 UNIT) tablet Take 2,000 Units by mouth every evening.    [provider]  ferrous sulfate 325 (65 FE) MG tablet Take 1 tablet (325 mg total) by mouth daily with breakfast. 02/11/22   Thurnell Lose, MD  folic acid (FOLVITE) 1 MG tablet Take 1 tablet (1 mg total) by mouth daily. 02/12/22   Thurnell Lose, MD  hydrochlorothiazide (HYDRODIURIL) 25 MG tablet Take 25 mg by mouth in the morning.    [provider]  LORazepam (ATIVAN) 0.5 MG tablet Take 1 tablet (0.5 mg total) by mouth 2 (two) times daily as needed for up to 3 days for anxiety (agitation). 09/09/22 09/12/22  Royal Hawthorn, NP  losartan (COZAAR) 100 MG tablet Take 100 mg by mouth at bedtime.    [provider]  methocarbamol (ROBAXIN) 500 MG tablet Take 250 mg by mouth every 6 (six) hours as needed for muscle spasms.    [provider]  metoprolol succinate (TOPROL-XL) 50 MG 24 hr tablet Take 50 mg by mouth every evening. Take with or immediately following a meal.    [provider]  nitroGLYCERIN (NITROSTAT) 0.4 MG SL tablet Place 1 tablet (0.4 mg  total) under the tongue every 5 (five) minutes as needed for chest pain. 09/23/20   Martinique, Peter M, MD  OLANZapine (ZYPREXA) 2.5 MG tablet Take 2.5 mg by mouth at bedtime.    [provider]  oxyCODONE-acetaminophen (PERCOCET) 5-325 MG tablet Take 1 tablet by mouth every 6 (six) hours as needed for severe pain. 09/08/22   Orpah Greek, MD  pantoprazole (PROTONIX) 40 MG tablet Take 1 tablet (40 mg total) by mouth daily. 02/12/22   Thurnell Lose, MD  potassium chloride (KLOR-CON M) 10 MEQ tablet Take 10 mEq by mouth daily.    [provider]  senna (SENOKOT) 8.6 MG TABS tablet Take 1 tablet by mouth in the morning.    [provider]  sertraline (ZOLOFT) 25 MG tablet Take 25 mg by mouth at bedtime.    [provider]  traMADol (ULTRAM) 50 MG tablet Take 25 mg by mouth every 6 (six) hours as needed.    [provider]      Allergies    Latex, Nifedipine, Tetanus toxoids, Tetanus-diphtheria toxoids td, and Adhesive [tape]    Review of Systems   Review of Systems  Physical Exam Updated Vital Signs BP (!) 186/84 (BP Location: Right Arm)   Pulse 63   Temp 97.6 F (36.4 C) (Oral)   Resp 20   Ht '5\' 4"'$  (  1.626 m)   Wt 57.6 kg   SpO2 95%   BMI 21.80 kg/m  Physical Exam  ED Results / Procedures / Treatments   Labs (all labs ordered are listed, but only abnormal results are displayed) Labs Reviewed - No data to display  EKG None  Radiology CT CHEST ABDOMEN PELVIS W CONTRAST  Result Date: 09/08/2022 CLINICAL DATA:  Left rib pain after unwitnessed fall 2 days ago. Back pain. EXAM: CT CHEST, ABDOMEN, AND PELVIS WITH CONTRAST TECHNIQUE: Multidetector CT imaging of the chest, abdomen and pelvis was performed following the standard protocol during bolus administration of intravenous contrast. RADIATION DOSE REDUCTION: This exam was performed according to the departmental dose-optimization program which includes automated exposure control,  adjustment of the mA and/or kV according to patient size and/or use of iterative reconstruction technique. CONTRAST:  75m OMNIPAQUE IOHEXOL 300 MG/ML  SOLN COMPARISON:  Chest CT 05/02/2021 FINDINGS: CT CHEST FINDINGS Cardiovascular: Heart size upper normal. No substantial pericardial effusion. Coronary artery calcification is evident. Aortic valve calcification evident. Moderate atherosclerotic calcification is noted in the wall of the thoracic aorta. No large central pulmonary embolus. Mediastinum/Nodes: No mediastinal lymphadenopathy. There is no hilar lymphadenopathy. Moderate to large hiatal hernia noted. There is no axillary lymphadenopathy. Lungs/Pleura: Scattered areas of peripheral clustered tree-in-bud micro nodularity identified in both lungs, suggesting sequelae of atypical infection. Left lower lobe compressive atelectasis noted adjacent to the hiatal hernia. No overtly suspicious pulmonary nodule or mass. No focal airspace consolidation. There is no evidence of pleural effusion. Musculoskeletal: Nonacute rib fractures are identified posteriorly. Nonacute fracture nonunion identified posterior right ninth rib. Thoracic spine compression fractures are noted at T3, T7, and T11. CT ABDOMEN PELVIS FINDINGS Hepatobiliary: No suspicious focal abnormality within the liver parenchyma. There is no evidence for gallstones, gallbladder wall thickening, or pericholecystic fluid. No intrahepatic or extrahepatic biliary dilation. Pancreas: No focal mass lesion. No dilatation of the main duct. No intraparenchymal cyst. No peripancreatic edema. Spleen: No splenomegaly. No focal mass lesion. Adrenals/Urinary Tract: No adrenal nodule or mass. Kidneys unremarkable. No evidence for hydroureter. The urinary bladder appears normal for the degree of distention. Stomach/Bowel: Large hiatal hernia contains 50-75% of the stomach. Duodenum is normally positioned as is the ligament of Treitz. No small bowel wall thickening. No  small bowel dilatation. The terminal ileum is normal. The appendix is not well visualized, but there is no edema or inflammation in the region of the cecum. No gross colonic mass. No colonic wall thickening. Diverticuli are seen scattered along the entire length of the colon without CT findings of diverticulitis. Diverticular disease is most advanced in the sigmoid segment. Vascular/Lymphatic: There is advanced atherosclerotic calcification of the abdominal aorta. Fusiform dilatation of the infrarenal abdominal aorta up to 3.0 cm diameter evident. There is no gastrohepatic or hepatoduodenal ligament lymphadenopathy. No retroperitoneal or mesenteric lymphadenopathy. No pelvic sidewall lymphadenopathy. Reproductive: The uterus is surgically absent. There is no adnexal mass. Other: No intraperitoneal free fluid. Musculoskeletal: No worrisome lytic or sclerotic osseous abnormality. Thoracolumbar scoliosis evident. IMPRESSION: 1. No acute findings in the chest, abdomen, or pelvis. 2. Scattered areas of peripheral clustered tree-in-bud micro nodularity in both lungs, suggesting sequelae of atypical infection. Findings are improved compared to chest CT 05/02/2021. 3. Moderate to large hiatal hernia contains 50-75% of the stomach. 4. 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 5. Thoracic spine compression fractures at T3, T7, and T11. 6. Fusiform dilatation of the infrarenal abdominal aorta up to 3.0 cm  diameter. Recommend follow-up ultrasound every 3 years. This recommendation follows ACR consensus guidelines: White Paper of the ACR Incidental Findings Committee II on Vascular Findings. J Am Coll Radiol 2013; 10:789-794. 7. Colonic diverticulosis without diverticulitis. 8. Aortic Atherosclerosis (ICD10-I70.0). Electronically Signed   By: Misty Stanley M.D.   On: 09/08/2022 06:45   CT CERVICAL SPINE WO CONTRAST  Result Date: 09/08/2022 CLINICAL DATA:  88 year old female  status post unwitnessed fall 2 days ago. Rib pain. EXAM: CT CERVICAL SPINE WITHOUT CONTRAST TECHNIQUE: Multidetector CT imaging of the cervical spine was performed without intravenous contrast. Multiplanar CT image reconstructions were also generated. RADIATION DOSE REDUCTION: This exam was performed according to the departmental dose-optimization program which includes automated exposure control, adjustment of the mA and/or kV according to patient size and/or use of iterative reconstruction technique. COMPARISON:  Cervical spine CT 12/15/2020. FINDINGS: Alignment: Mildly exaggerated cervical lordosis has not significantly changed since 2021, with mild chronic anterolisthesis at both C2-C3 and C6-C7. Subtle chronic anterolisthesis at C7-T1. Bilateral posterior element alignment is within normal limits. Skull base and vertebrae: Osteopenia. Visualized skull base is intact. No atlanto-occipital dissociation. C1 and C2 appear stable. Stable chronic C6 mild vertebral loss of height. No acute osseous abnormality identified. Soft tissues and spinal canal: No prevertebral fluid or swelling. No visible canal hematoma. Stable visible noncontrast neck soft tissues, calcified carotid atherosclerosis. Evidence of chronic left vertebral artery tortuosity. Disc levels: Advanced chronic cervical spine degeneration appears stable since 2021. Mild multifactorial spinal stenosis suspected at C3-C4. Upper chest: Chest CT today is reported separately. IMPRESSION: 1. No acute traumatic injury identified in the cervical spine. 2. Osteopenia and advanced chronic cervical spine degeneration appear stable since 2021. 3. Chest CT today is reported separately. Electronically Signed   By: Genevie Ann M.D.   On: 09/08/2022 06:44   CT HEAD WO CONTRAST (5MM)  Result Date: 09/08/2022 CLINICAL DATA:  88 year old female status post unwitnessed fall 2 days ago. Rib pain. EXAM: CT HEAD WITHOUT CONTRAST TECHNIQUE: Contiguous axial images were obtained  from the base of the skull through the vertex without intravenous contrast. RADIATION DOSE REDUCTION: This exam was performed according to the departmental dose-optimization program which includes automated exposure control, adjustment of the mA and/or kV according to patient size and/or use of iterative reconstruction technique. COMPARISON:  Head CT 05/02/2021. FINDINGS: Brain: Stable cerebral volume. No midline shift, ventriculomegaly, intracranial hemorrhage or evidence of cortically based acute infarction. Patchy mild to moderate for age bilateral cerebral white matter hypodensity appears stable from last year. Subtle chronic rounded and mildly hyperdense right posterior para falcine meningioma is up to 22 mm long axis (coronal image 48) but not significantly changed since 2021. No associated cerebral edema, and no significant regional mass effect. Vascular: Calcified atherosclerosis at the skull base. No suspicious intracranial vascular hyperdensity. Dominant appearing distal left vertebral artery. Skull: Stable.  No acute osseous abnormality identified. Sinuses/Orbits: Visualized paranasal sinuses and mastoids are stable and well aerated. Other: No acute orbit or scalp soft tissue finding. There is some chronic scalp vessel calcified atherosclerosis. IMPRESSION: 1. No acute intracranial abnormality or acute traumatic injury identified. 2. 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 No cerebral edema, suggesting this is clinically silent. 3. Stable mild to moderate for age cerebral white matter changes, most commonly due to chronic small vessel disease. Electronically Signed   By: Genevie Ann M.D.   On: 09/08/2022 06:40   DG Ribs  Bilateral W/Chest  Result Date: 09/08/2022 CLINICAL DATA:  88 year old female status post unwitnessed fall. Upper back pain. EXAM: BILATERAL RIBS AND CHEST - 4+ VIEW COMPARISON:  Rib radiographs 12/14/2021.   Chest CT 05/02/2021. FINDINGS: Semi upright AP view of the chest at 0435 hours. Stable cardiomegaly and mediastinal contours. Chronic sternotomy. Moderate to large chronic gastric hiatal hernia better demonstrated by CT. Pronounced Calcified aortic atherosclerosis. Lower lung volumes compared to last year. Vague increased bibasilar opacity with no air bronchograms. No pneumothorax. Pulmonary vascularity appears stable. Osteopenia. Multiple chronic left lateral rib fractures, demonstrated by CT last year. No acute displaced rib fracture identified. Other visible osseous structures appear grossly intact, chronic thoracolumbar scoliosis. Paucity of bowel gas in the upper abdomen. IMPRESSION: 1. No acute displaced rib fracture identified. Multiple previous left lateral rib fractures as seen by CT last year. 2. Chronic cardiomegaly, hiatal hernia, Aortic Atherosclerosis (ICD10-I70.0). 3. Lower lung volumes with vague bibasilar opacity, favor atelectasis. Electronically Signed   By: Genevie Ann M.D.   On: 09/08/2022 05:11    Procedures Procedures  {Document cardiac monitor, telemetry assessment procedure when appropriate:1}  Medications Ordered in ED Medications - No data to display  ED Course/ Medical Decision Making/ A&P                           Medical Decision Making  ***  {Document critical care time when appropriate:1} {Document review of labs and clinical decision tools ie heart score, Chads2Vasc2 etc:1}  {Document your independent review of radiology images, and any outside records:1} {Document your discussion with family members, caretakers, and with consultants:1} {Document social determinants of health affecting pt's care:1} {Document your decision making why or why not admission, treatments were needed:1} Final Clinical Impression(s) / ED Diagnoses Final diagnoses:  None    Rx / DC Orders ED Discharge Orders     None

## 2022-09-09 NOTE — ED Triage Notes (Signed)
Pt to ED via [facility] from [facility].  Pt had an unwitnessed fall at SNF approximately 3 days ago and has back and side pain since then.  Pt seen for same yesterday.  Pt's family called EMS today d/t pt having increased pain in back.   Pt alert, GCS=14 on arrival, at baseline.  Pt c/o pain in back. Lidoderm patch present on left back/side on arrival. Pt moving all extremities, NAD noted.

## 2022-09-09 NOTE — Telephone Encounter (Signed)
Nurse called to report this resident is having increased agitation. She is on zyprexa chronically for dementia with behaviors. Over the week she has been more agitated and appears anxious. She was seen in the emergency room for a fall and rib pain one day ago. The fall was unwitnessed. She did hit her head.  CT of the head showed no acute bleed. CT of the neck showed no acute trauma. CT of the chest showed no acute findings but non acute bilateral rib fractures were noted with non union of the right ninth rib and thoracic spine compression fracture was also noted. She had a CBC and BMP done which were unrevealing. The nurse has not assessed her or taken vitals. I asked the nurse to do these things, let me know if their are any abnormalities, and obtain a UA C and S. The nurse and her POA have asked for something for anxiety order given for prn ativan for 3 days only and provider to reassess on Monday. For her pain she has been treated with tylenol, percocet, and robaxin.

## 2022-09-09 NOTE — Telephone Encounter (Signed)
Nurse from [facility] called to report the resident received Ativan and it calmed down. Her POA arrived and felt that she still needed to go to the ER for pain control and receive "stronger meds".

## 2022-09-10 DIAGNOSIS — F32A Depression, unspecified: Secondary | ICD-10-CM | POA: Diagnosis not present

## 2022-09-10 DIAGNOSIS — I959 Hypotension, unspecified: Secondary | ICD-10-CM | POA: Diagnosis not present

## 2022-09-10 DIAGNOSIS — S299XXA Unspecified injury of thorax, initial encounter: Secondary | ICD-10-CM | POA: Diagnosis not present

## 2022-09-10 DIAGNOSIS — M419 Scoliosis, unspecified: Secondary | ICD-10-CM | POA: Diagnosis not present

## 2022-09-10 DIAGNOSIS — I10 Essential (primary) hypertension: Secondary | ICD-10-CM | POA: Diagnosis not present

## 2022-09-10 DIAGNOSIS — W19XXXA Unspecified fall, initial encounter: Secondary | ICD-10-CM | POA: Diagnosis not present

## 2022-09-10 DIAGNOSIS — M546 Pain in thoracic spine: Secondary | ICD-10-CM | POA: Diagnosis not present

## 2022-09-10 DIAGNOSIS — Z7401 Bed confinement status: Secondary | ICD-10-CM | POA: Diagnosis not present

## 2022-09-10 MED ORDER — MORPHINE SULFATE (PF) 2 MG/ML IV SOLN
2.0000 mg | Freq: Once | INTRAVENOUS | Status: AC
  Administered 2022-09-10: 2 mg via INTRAVENOUS
  Filled 2022-09-10: qty 1

## 2022-09-10 NOTE — Discharge Instructions (Signed)
There is was seen in the ER today for her pain.  Further evaluation yesterday as well as today she does not have any acute severe injuries from her fall but likely is experiencing soreness from the fall.  You may increase the dosage of her Percocet to 1 to 2 tablets every 4-6 hours for appropriate management for pain as tolerated by the patient.  She may return to the ER with any new severe symptoms.

## 2022-09-10 NOTE — ED Notes (Signed)
Pt's 02 sat=88%, 2LNC 02 applied.

## 2022-09-11 ENCOUNTER — Other Ambulatory Visit: Payer: Self-pay | Admitting: Orthopedic Surgery

## 2022-09-11 ENCOUNTER — Encounter: Payer: Self-pay | Admitting: Orthopedic Surgery

## 2022-09-11 ENCOUNTER — Non-Acute Institutional Stay: Payer: Medicare Other | Admitting: Orthopedic Surgery

## 2022-09-11 ENCOUNTER — Other Ambulatory Visit: Payer: Self-pay | Admitting: Internal Medicine

## 2022-09-11 DIAGNOSIS — K5903 Drug induced constipation: Secondary | ICD-10-CM | POA: Diagnosis not present

## 2022-09-11 DIAGNOSIS — G301 Alzheimer's disease with late onset: Secondary | ICD-10-CM | POA: Diagnosis not present

## 2022-09-11 DIAGNOSIS — S2231XD Fracture of one rib, right side, subsequent encounter for fracture with routine healing: Secondary | ICD-10-CM | POA: Diagnosis not present

## 2022-09-11 DIAGNOSIS — F02B18 Dementia in other diseases classified elsewhere, moderate, with other behavioral disturbance: Secondary | ICD-10-CM

## 2022-09-11 DIAGNOSIS — S32030S Wedge compression fracture of third lumbar vertebra, sequela: Secondary | ICD-10-CM | POA: Diagnosis not present

## 2022-09-11 DIAGNOSIS — R300 Dysuria: Secondary | ICD-10-CM | POA: Diagnosis not present

## 2022-09-11 DIAGNOSIS — S0003XA Contusion of scalp, initial encounter: Secondary | ICD-10-CM | POA: Diagnosis not present

## 2022-09-11 DIAGNOSIS — S22000A Wedge compression fracture of unspecified thoracic vertebra, initial encounter for closed fracture: Secondary | ICD-10-CM | POA: Diagnosis not present

## 2022-09-11 DIAGNOSIS — R296 Repeated falls: Secondary | ICD-10-CM | POA: Diagnosis not present

## 2022-09-11 DIAGNOSIS — N390 Urinary tract infection, site not specified: Secondary | ICD-10-CM | POA: Diagnosis not present

## 2022-09-11 DIAGNOSIS — D649 Anemia, unspecified: Secondary | ICD-10-CM | POA: Diagnosis not present

## 2022-09-11 LAB — CBC WITH DIFFERENTIAL/PLATELET
Absolute Monocytes: 732 cells/uL (ref 200–950)
Basophils Absolute: 11 cells/uL (ref 0–200)
Basophils Relative: 0.2 %
Eosinophils Absolute: 143 cells/uL (ref 15–500)
Eosinophils Relative: 2.6 %
HCT: 42.1 % (ref 35.0–45.0)
Hemoglobin: 14.5 g/dL (ref 11.7–15.5)
Lymphs Abs: 814 cells/uL — ABNORMAL LOW (ref 850–3900)
MCH: 30.9 pg (ref 27.0–33.0)
MCHC: 34.4 g/dL (ref 32.0–36.0)
MCV: 89.6 fL (ref 80.0–100.0)
MPV: 10 fL (ref 7.5–12.5)
Monocytes Relative: 13.3 %
Neutro Abs: 3801 cells/uL (ref 1500–7800)
Neutrophils Relative %: 69.1 %
Platelets: 236 10*3/uL (ref 140–400)
RBC: 4.7 10*6/uL (ref 3.80–5.10)
RDW: 12.6 % (ref 11.0–15.0)
Total Lymphocyte: 14.8 %
WBC: 5.5 10*3/uL (ref 3.8–10.8)

## 2022-09-11 LAB — COMPLETE METABOLIC PANEL WITH GFR
AG Ratio: 1.2 (calc) (ref 1.0–2.5)
ALT: 11 U/L (ref 6–29)
AST: 18 U/L (ref 10–35)
Albumin: 3.6 g/dL (ref 3.6–5.1)
Alkaline phosphatase (APISO): 79 U/L (ref 37–153)
BUN: 16 mg/dL (ref 7–25)
CO2: 29 mmol/L (ref 20–32)
Calcium: 9 mg/dL (ref 8.6–10.4)
Chloride: 96 mmol/L — ABNORMAL LOW (ref 98–110)
Creat: 0.62 mg/dL (ref 0.60–0.95)
Globulin: 3 g/dL (calc) (ref 1.9–3.7)
Glucose, Bld: 67 mg/dL (ref 65–99)
Potassium: 3.4 mmol/L — ABNORMAL LOW (ref 3.5–5.3)
Sodium: 135 mmol/L (ref 135–146)
Total Bilirubin: 0.6 mg/dL (ref 0.2–1.2)
Total Protein: 6.6 g/dL (ref 6.1–8.1)
eGFR: 85 mL/min/{1.73_m2} (ref >=60)

## 2022-09-11 MED ORDER — OXYCODONE-ACETAMINOPHEN 5-325 MG PO TABS: 1.0000 | ORAL_TABLET | Freq: Two times a day (BID) | ORAL | 0 refills | Status: DC

## 2022-09-11 MED ORDER — OXYCODONE-ACETAMINOPHEN 5-325 MG PO TABS: 1.0000 | ORAL_TABLET | Freq: Two times a day (BID) | ORAL | 0 refills | Status: DC | PRN

## 2022-09-11 MED ORDER — POLYETHYLENE GLYCOL 3350 17 GM/SCOOP PO POWD: 17.0000 g | ORAL | 1 refills | Status: DC

## 2022-09-11 MED ORDER — LORAZEPAM 0.5 MG PO TABS: 0.5000 mg | ORAL_TABLET | Freq: Two times a day (BID) | ORAL | 0 refills | Status: DC | PRN

## 2022-09-11 NOTE — Progress Notes (Addendum)
Location:   [facility] [place] Room Number: 18/A Place of Service:  ALF [phone]) Provider:  [name], NP   [name], MD  Patient Care Team: [name], MD as PCP - General (Internal Medicine) Martinique, Peter M, MD as PCP - Cardiology (Cardiology)  Extended Emergency Contact Information Primary Emergency Contact: [name],[name] Address: [address]          [place] [address] [name] of [place] Phone: [phone] Mobile Phone: [phone] Relation: Sister Secondary Emergency Contact: [name],[name]  [name] of [place] Phone: [phone] Mobile Phone: [phone] Relation: Niece  Code Status:  DNR Goals of care: Advanced Directive information    09/09/2022   10:23 PM  Advanced Directives  Does Patient Have a Medical Advance Directive? Yes  Type of Advance Directive Out of facility DNR (pink MOST or yellow form)  Does patient want to make changes to medical advance directive? No - Guardian declined  Pre-existing out of facility DNR order (yellow form or pink MOST form) Pink MOST/Yellow Form most recent copy in chart - Physician notified to receive inpatient order     Chief Complaint  Patient presents with   Acute Visit    Left sided chest pain    HPI:  Pt is a 88 y.o. female seen today for acute visit due to left sided chest pain.   09/12 she had a witnessed fall in shower. She c/o increased back pain after event. She was also noted to have hematoma to back of head. 09/13 xray spine revealed compression fracture of T6, T10 and L3. CXR negative for rib fracture. She was given tramadol, robaxin and lidocaine patches for pain. PT also ordered.   09/15 she presented to [facility] ED due to ongoing bilateral rib pain. CXR negative for acute fracture. 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 She was given 2 small doses of morphine and pain resolved. She was sent back to [facility] at discharge. Prescription for percocet given.   09/16 she presented to [facility] ED due to unresolved back and rib pain. She was given one dose of percocet after previous ED discharge. Repeat CXR negative for acute abnormality. She was discharged back to [facility] with recommendations to increase frequency of Percocet administration.   Today, she is tearful during encounter. She reports pain to middle back and under both breasts. Pain increased with inspiration and movement.    Past Medical History:  Diagnosis Date   Anxiety and depression    Aortic stenosis    a. 03/2014: Mild aortic stenosis by valve area and mean gradient though appeared more visually consistent with moderate AS.    CAD (coronary artery disease)    a. s/p prior PCI of D2 in 1992 b. stent to RCA in 2005   Diverticula of colon    GERD (gastroesophageal reflux disease)    HTN (hypertension)    Hypercholesteremia    Lateral myocardial infarction [facility]) 1992   Osteoarthritis    Osteopenia    Scoliosis    Past Surgical History:  Procedure Laterality Date   ABDOMINAL HYSTERECTOMY     APPENDECTOMY     CORONARY ANGIOPLASTY     TONSILLECTOMY      Allergies  Allergen Reactions   Latex Other (See Comments)    Unknown reaction - listed on [facility] 02/10/22  Nifedipine Other (See Comments)    Dark Urine; can only take orange tablet, allergic to yellow-brown tablet   Tetanus Toxoids Other (See Comments)    Unknown reaction   Tetanus-Diphtheria Toxoids Td Other (See Comments)    Unknown reaction   Adhesive [Tape] Rash    Outpatient Encounter Medications as of 09/11/2022  Medication Sig   acetaminophen (TYLENOL) 500 MG tablet Take 1,000 mg by mouth 2 (two) times daily.   amLODipine (NORVASC) 5 MG tablet Take 5 mg by mouth daily.   atorvastatin (LIPITOR) 10 MG tablet Take 10 mg by mouth  at bedtime.   cetirizine (ZYRTEC) 10 MG tablet Take 10 mg by mouth every morning.   cholecalciferol (VITAMIN D) 25 MCG (1000 UNIT) tablet Take 2,000 Units by mouth every evening.   ferrous sulfate 325 (65 FE) MG tablet Take 1 tablet (325 mg total) by mouth daily with breakfast.   folic acid (FOLVITE) 1 MG tablet Take 1 tablet (1 mg total) by mouth daily.   hydrochlorothiazide (HYDRODIURIL) 25 MG tablet Take 25 mg by mouth in the morning.   LORazepam (ATIVAN) 0.5 MG tablet Take 1 tablet (0.5 mg total) by mouth 2 (two) times daily as needed for up to 3 days for anxiety (agitation).   losartan (COZAAR) 100 MG tablet Take 100 mg by mouth at bedtime.   methocarbamol (ROBAXIN) 500 MG tablet Take 250 mg by mouth every 6 (six) hours as needed for muscle spasms.   metoprolol succinate (TOPROL-XL) 50 MG 24 hr tablet Take 50 mg by mouth every evening. Take with or immediately following a meal.   nitroGLYCERIN (NITROSTAT) 0.4 MG SL tablet Place 1 tablet (0.4 mg total) under the tongue every 5 (five) minutes as needed for chest pain.   OLANZapine (ZYPREXA) 2.5 MG tablet Take 2.5 mg by mouth at bedtime.   oxyCODONE-acetaminophen (PERCOCET) 5-325 MG tablet Take 1 tablet by mouth every 6 (six) hours as needed for severe pain.   pantoprazole (PROTONIX) 40 MG tablet Take 1 tablet (40 mg total) by mouth daily.   potassium chloride (KLOR-CON M) 10 MEQ tablet Take 10 mEq by mouth daily.   senna (SENOKOT) 8.6 MG TABS tablet Take 1 tablet by mouth in the morning.   sertraline (ZOLOFT) 25 MG tablet Take 25 mg by mouth at bedtime.   traMADol (ULTRAM) 50 MG tablet Take 25 mg by mouth every 6 (six) hours as needed.   No facility-administered encounter medications on file as of 09/11/2022.    Review of Systems  Constitutional:  Positive for activity change. Negative for appetite change, chills, fatigue and fever.  HENT:  Negative for congestion and sore throat.   Eyes:  Negative for visual disturbance.  Respiratory:   Negative for cough, shortness of breath and wheezing.   Cardiovascular:  Negative for chest pain and leg swelling.  Gastrointestinal:  Negative for abdominal distention, abdominal pain, constipation, diarrhea, nausea and vomiting.  Genitourinary:  Negative for dysuria and frequency.  Musculoskeletal:  Positive for arthralgias, back pain and gait problem.  Skin:  Negative for wound.  Neurological:  Positive for weakness. Negative for dizziness and headaches.  Psychiatric/Behavioral:  Positive for confusion. Negative for dysphoric mood and sleep disturbance. The patient is not nervous/anxious.     Immunization History  Administered Date(s) Administered   Influenza Split 05/13/2009, 05/18/2010, 09/29/2011, 09/05/2012, 09/03/2013   Influenza, High Dose Seasonal PF 09/09/2015, 08/30/2017   Influenza, Quadrivalent, Recombinant, Inj, Pf 08/19/2018, 09/29/2019   Influenza-Unspecified 10/18/2021   Moderna Covid-19  Vaccine Bivalent Booster 82yr & up 09/14/2021   Moderna SARS-COV2 Booster Vaccination 06/01/2021   Moderna Sars-Covid-2 Vaccination 12/29/2019, 01/26/2020   Pneumococcal Conjugate-13 07/08/2013   Pneumococcal Polysaccharide-23 06/29/2006, 05/13/2009, 05/18/2010, 05/25/2011   Td 05/13/2009, 05/18/2010, 05/25/2011   Zoster Recombinat (Shingrix) 08/24/2017, 10/31/2017   Zoster, Live 08/22/2008, 08/24/2008, 05/13/2009, 05/18/2010, 05/25/2011   Pertinent  Health Maintenance Due  Topic Date Due   INFLUENZA VACCINE  07/25/2022   DEXA SCAN  Completed      02/11/2022    8:00 AM 03/20/2022   10:22 AM 09/07/2022   12:11 PM 09/08/2022    3:52 AM 09/09/2022   10:24 PM  Fall Risk  Falls in the past year?  1 0    Was there an injury with Fall?  0 0    Fall Risk Category Calculator  1 0    Fall Risk Category  Low Low    Patient Fall Risk Level Moderate fall risk Moderate fall risk Low fall risk Moderate fall risk High fall risk  Patient at Risk for Falls Due to  History of fall(s);Impaired  balance/gait;Mental status change;Other (Comment) No Fall Risks    Patient at Risk for Falls Due to - Comments  vascular dementia     Fall risk Follow up  Falls evaluation completed;Education provided;Falls prevention discussed Falls evaluation completed     Functional Status Survey:    Vitals:   09/11/22 1203  BP: (!) 150/90  Pulse: 66  Resp: (!) 22  Temp: 98.8 F (37.1 C)  SpO2: 93%  Weight: 126 lb 6.4 oz (57.3 kg)   Body mass index is 21.7 kg/m. Physical Exam Vitals reviewed.  Constitutional:      General: She is not in acute distress. HENT:     Head: Normocephalic.     Right Ear: There is no impacted cerumen.     Left Ear: There is no impacted cerumen.     Nose: Nose normal.     Mouth/Throat:     Mouth: Mucous membranes are moist.  Eyes:     General:        Right eye: No discharge.        Left eye: No discharge.  Cardiovascular:     Rate and Rhythm: Normal rate. Rhythm irregular.     Pulses: Normal pulses.     Heart sounds: Normal heart sounds.  Pulmonary:     Effort: Pulmonary effort is normal. No respiratory distress.     Breath sounds: Normal breath sounds. No wheezing.  Chest:     Chest wall: Tenderness present. No mass, deformity or swelling.     Comments: Tenderness under right and left breast Abdominal:     General: Bowel sounds are normal. There is no distension.     Palpations: Abdomen is soft.     Tenderness: There is no abdominal tenderness.  Musculoskeletal:     Cervical back: Neck supple. No swelling, tenderness or crepitus. Normal range of motion.     Thoracic back: Tenderness present. No swelling or deformity. Decreased range of motion.     Lumbar back: Tenderness present. No swelling or deformity. Decreased range of motion.     Right lower leg: No edema.     Left lower leg: No edema.  Skin:    General: Skin is warm and dry.     Capillary Refill: Capillary refill takes less than 2 seconds.     Comments: Approc 2 cm hematoma to occipital lobe  of scalp, tender to touch,  no skin breakdown   Neurological:     General: No focal deficit present.     Mental Status: She is alert. Mental status is at baseline.     Motor: Weakness present.     Gait: Gait abnormal.     Comments: walker  Psychiatric:        Mood and Affect: Mood normal.        Behavior: Behavior normal.     Labs reviewed: Recent Labs    02/10/22 1303 02/11/22 0108 06/05/22 0000 09/08/22 0422  NA 137 138 142 134*  K 3.4* 3.0* 3.9 3.2*  CL 104 106 107 101  CO2 24 24 29* 25  GLUCOSE 98 110*  --  106*  BUN '16 11 16 10  '$ CREATININE 0.74 0.64 0.5 0.38*  CALCIUM 8.5* 8.3* 8.7 8.9   Recent Labs    11/24/21 0000 02/09/22 0000 02/10/22 1303  AST '16 23 28  '$ ALT '12 22 25  '$ ALKPHOS 106 90 97  BILITOT  --   --  0.5  PROT  --   --  6.3*  ALBUMIN 3.2* 3.7 3.2*   Recent Labs    02/10/22 1303 02/11/22 0108 02/20/22 0000 03/09/22 0000 06/05/22 0000 09/08/22 0422  WBC 4.6 5.8 3.9 3.7 4.2 6.7  NEUTROABS 2.8 4.1 2,071.00 1,550.00  --  5.0  HGB 6.1* 8.0* 9.1* 10.7* 12.5 14.1  HCT 21.8* 26.6* 31* 36 39 43.2  MCV 82.0 80.4  --   --   --  95.6  PLT 253 216 291 266 209 220   Lab Results  Component Value Date   TSH 2.90 11/24/2021   No results found for: "HGBA1C" Lab Results  Component Value Date   CHOL 86 01/23/2022   HDL 43 01/23/2022   LDLCALC 27 01/23/2022   TRIG 77 01/23/2022   CHOLHDL 1.9 09/17/2020    Significant Diagnostic Results in last 30 days:  DG Ribs Unilateral W/Chest Right  Result Date: 09/10/2022 CLINICAL DATA:  Trauma with rib pain EXAM: RIGHT RIBS AND CHEST - 3+ VIEW COMPARISON:  None Available. FINDINGS: No fracture or other bone lesions are seen involving the ribs. There is no evidence of pneumothorax or pleural effusion. Both lungs are clear. Heart size and mediastinal contours are within normal limits. S shaped scoliosis. IMPRESSION: No rib fracture. Clear lungs. Electronically Signed   By: Ulyses Jarred M.D.   On: 09/10/2022 00:13    CT CHEST ABDOMEN PELVIS W CONTRAST  Result Date: 09/08/2022 CLINICAL DATA:  Left rib pain after unwitnessed fall 2 days ago. Back pain. EXAM: CT CHEST, ABDOMEN, AND PELVIS WITH CONTRAST TECHNIQUE: Multidetector CT imaging of the chest, abdomen and pelvis was performed following the standard protocol during bolus administration of intravenous contrast. RADIATION DOSE REDUCTION: This exam was performed according to the departmental dose-optimization program which includes automated exposure control, adjustment of the mA and/or kV according to patient size and/or use of iterative reconstruction technique. CONTRAST:  4m OMNIPAQUE IOHEXOL 300 MG/ML  SOLN COMPARISON:  Chest CT 05/02/2021 FINDINGS: CT CHEST FINDINGS Cardiovascular: Heart size upper normal. No substantial pericardial effusion. Coronary artery calcification is evident. Aortic valve calcification evident. Moderate atherosclerotic calcification is noted in the wall of the thoracic aorta. No large central pulmonary embolus. Mediastinum/Nodes: No mediastinal lymphadenopathy. There is no hilar lymphadenopathy. Moderate to large hiatal hernia noted. There is no axillary lymphadenopathy. Lungs/Pleura: Scattered areas of peripheral clustered tree-in-bud micro nodularity identified in both lungs, suggesting sequelae of atypical infection. Left lower lobe  compressive atelectasis noted adjacent to the hiatal hernia. No overtly suspicious pulmonary nodule or mass. No focal airspace consolidation. There is no evidence of pleural effusion. Musculoskeletal: Nonacute rib fractures are identified posteriorly. Nonacute fracture nonunion identified posterior right ninth rib. Thoracic spine compression fractures are noted at T3, T7, and T11. CT ABDOMEN PELVIS FINDINGS Hepatobiliary: No suspicious focal abnormality within the liver parenchyma. There is no evidence for gallstones, gallbladder wall thickening, or pericholecystic fluid. No intrahepatic or extrahepatic  biliary dilation. Pancreas: No focal mass lesion. No dilatation of the main duct. No intraparenchymal cyst. No peripancreatic edema. Spleen: No splenomegaly. No focal mass lesion. Adrenals/Urinary Tract: No adrenal nodule or mass. Kidneys unremarkable. No evidence for hydroureter. The urinary bladder appears normal for the degree of distention. Stomach/Bowel: Large hiatal hernia contains 50-75% of the stomach. Duodenum is normally positioned as is the ligament of Treitz. No small bowel wall thickening. No small bowel dilatation. The terminal ileum is normal. The appendix is not well visualized, but there is no edema or inflammation in the region of the cecum. No gross colonic mass. No colonic wall thickening. Diverticuli are seen scattered along the entire length of the colon without CT findings of diverticulitis. Diverticular disease is most advanced in the sigmoid segment. Vascular/Lymphatic: There is advanced atherosclerotic calcification of the abdominal aorta. Fusiform dilatation of the infrarenal abdominal aorta up to 3.0 cm diameter evident. There is no gastrohepatic or hepatoduodenal ligament lymphadenopathy. No retroperitoneal or mesenteric lymphadenopathy. No pelvic sidewall lymphadenopathy. Reproductive: The uterus is surgically absent. There is no adnexal mass. Other: No intraperitoneal free fluid. Musculoskeletal: No worrisome lytic or sclerotic osseous abnormality. Thoracolumbar scoliosis evident. IMPRESSION: 1. No acute findings in the chest, abdomen, or pelvis. 2. Scattered areas of peripheral clustered tree-in-bud micro nodularity in both lungs, suggesting sequelae of atypical infection. Findings are improved compared to chest CT 05/02/2021. 3. Moderate to large hiatal hernia contains 50-75% of the stomach. 4. 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 5. Thoracic spine compression fractures at T3, T7, and T11. 6. Fusiform dilatation of the infrarenal  abdominal aorta up to 3.0 cm diameter. Recommend follow-up ultrasound every 3 years. This recommendation follows ACR consensus guidelines: White Paper of the ACR Incidental Findings Committee II on Vascular Findings. J Am Coll Radiol 2013; 10:789-794. 7. Colonic diverticulosis without diverticulitis. 8. Aortic Atherosclerosis (ICD10-I70.0). Electronically Signed   By: Misty Stanley M.D.   On: 09/08/2022 06:45   CT CERVICAL SPINE WO CONTRAST  Result Date: 09/08/2022 CLINICAL DATA:  88 year old female status post unwitnessed fall 2 days ago. Rib pain. EXAM: CT CERVICAL SPINE WITHOUT CONTRAST TECHNIQUE: Multidetector CT imaging of the cervical spine was performed without intravenous contrast. Multiplanar CT image reconstructions were also generated. RADIATION DOSE REDUCTION: This exam was performed according to the departmental dose-optimization program which includes automated exposure control, adjustment of the mA and/or kV according to patient size and/or use of iterative reconstruction technique. COMPARISON:  Cervical spine CT 12/15/2020. FINDINGS: Alignment: Mildly exaggerated cervical lordosis has not significantly changed since 2021, with mild chronic anterolisthesis at both C2-C3 and C6-C7. Subtle chronic anterolisthesis at C7-T1. Bilateral posterior element alignment is within normal limits. Skull base and vertebrae: Osteopenia. Visualized skull base is intact. No atlanto-occipital dissociation. C1 and C2 appear stable. Stable chronic C6 mild vertebral loss of height. No acute osseous abnormality identified. Soft tissues and spinal canal: No prevertebral fluid or swelling. No visible canal hematoma. Stable visible noncontrast neck soft tissues, calcified carotid atherosclerosis. Evidence  of chronic left vertebral artery tortuosity. Disc levels: Advanced chronic cervical spine degeneration appears stable since 2021. Mild multifactorial spinal stenosis suspected at C3-C4. Upper chest: Chest CT today is  reported separately. IMPRESSION: 1. No acute traumatic injury identified in the cervical spine. 2. Osteopenia and advanced chronic cervical spine degeneration appear stable since 2021. 3. Chest CT today is reported separately. Electronically Signed   By: Genevie Ann M.D.   On: 09/08/2022 06:44   CT HEAD WO CONTRAST (5MM)  Result Date: 09/08/2022 CLINICAL DATA:  88 year old female status post unwitnessed fall 2 days ago. Rib pain. EXAM: CT HEAD WITHOUT CONTRAST TECHNIQUE: Contiguous axial images were obtained from the base of the skull through the vertex without intravenous contrast. RADIATION DOSE REDUCTION: This exam was performed according to the departmental dose-optimization program which includes automated exposure control, adjustment of the mA and/or kV according to patient size and/or use of iterative reconstruction technique. COMPARISON:  Head CT 05/02/2021. FINDINGS: Brain: Stable cerebral volume. No midline shift, ventriculomegaly, intracranial hemorrhage or evidence of cortically based acute infarction. Patchy mild to moderate for age bilateral cerebral white matter hypodensity appears stable from last year. Subtle chronic rounded and mildly hyperdense right posterior para falcine meningioma is up to 22 mm long axis (coronal image 48) but not significantly changed since 2021. No associated cerebral edema, and no significant regional mass effect. Vascular: Calcified atherosclerosis at the skull base. No suspicious intracranial vascular hyperdensity. Dominant appearing distal left vertebral artery. Skull: Stable.  No acute osseous abnormality identified. Sinuses/Orbits: Visualized paranasal sinuses and mastoids are stable and well aerated. Other: No acute orbit or scalp soft tissue finding. There is some chronic scalp vessel calcified atherosclerosis. IMPRESSION: 1. No acute intracranial abnormality or acute traumatic injury identified. 2. Up to 2.2 cm but fairly subtle chronic para-falcine Meningioma on  the right has not significantly changed since 2021. No cerebral edema, suggesting this is clinically silent. 3. Stable mild to moderate for age cerebral white matter changes, most commonly due to chronic small vessel disease. Electronically Signed   By: Genevie Ann M.D.   On: 09/08/2022 06:40   DG Ribs Bilateral W/Chest  Result Date: 09/08/2022 CLINICAL DATA:  88 year old female status post unwitnessed fall. Upper back pain. EXAM: BILATERAL RIBS AND CHEST - 4+ VIEW COMPARISON:  Rib radiographs 12/14/2021.  Chest CT 05/02/2021. FINDINGS: Semi upright AP view of the chest at 0435 hours. Stable cardiomegaly and mediastinal contours. Chronic sternotomy. Moderate to large chronic gastric hiatal hernia better demonstrated by CT. Pronounced Calcified aortic atherosclerosis. Lower lung volumes compared to last year. Vague increased bibasilar opacity with no air bronchograms. No pneumothorax. Pulmonary vascularity appears stable. Osteopenia. Multiple chronic left lateral rib fractures, demonstrated by CT last year. No acute displaced rib fracture identified. Other visible osseous structures appear grossly intact, chronic thoracolumbar scoliosis. Paucity of bowel gas in the upper abdomen. IMPRESSION: 1. No acute displaced rib fracture identified. Multiple previous left lateral rib fractures as seen by CT last year. 2. Chronic cardiomegaly, hiatal hernia, Aortic Atherosclerosis (ICD10-I70.0). 3. Lower lung volumes with vague bibasilar opacity, favor atelectasis. Electronically Signed   By: Genevie Ann M.D.   On: 09/08/2022 05:11    Assessment/Plan 1. Compression fracture of body of thoracic vertebra ([place]) - 09/13 xray revealed fracture to T6, T10, L3 - 09/15 CT chest noted fracture to T3, T7, T11 - discontinue Percocet q 6 prn - start tylenol 500 mg po TID - start Percocet 5/325 mg po BID x 14 days - start  Percocet 5/325 mg po BID prn for breakthrough pain - start miralax QOD - cont senna  2. Compression fracture of  L3 lumbar vertebra, sequela - see above  3. Closed fracture of one rib of right side with routine healing, subsequent encounter - see above - cont incentive spirometer  4. Frequent falls - 09/12 mechanical fall  - see above - PT evaluation  5. Hematoma of occipital region of scalp - appears smaller in size  6. Moderate late onset Alzheimer's dementia with other behavioral disturbance ([place]) - no recent behaviors - lives in [place] - ambulates with walker - cont ativan and Zyprexa    Family/ staff Communication: plan discussed with patient and nurse  Labs/tests ordered:  none

## 2022-09-12 ENCOUNTER — Telehealth: Payer: Self-pay

## 2022-09-12 DIAGNOSIS — Z9181 History of falling: Secondary | ICD-10-CM | POA: Diagnosis not present

## 2022-09-12 DIAGNOSIS — I4891 Unspecified atrial fibrillation: Secondary | ICD-10-CM | POA: Diagnosis not present

## 2022-09-12 DIAGNOSIS — R41841 Cognitive communication deficit: Secondary | ICD-10-CM | POA: Diagnosis not present

## 2022-09-12 DIAGNOSIS — M545 Low back pain, unspecified: Secondary | ICD-10-CM | POA: Diagnosis not present

## 2022-09-12 DIAGNOSIS — M6281 Muscle weakness (generalized): Secondary | ICD-10-CM | POA: Diagnosis not present

## 2022-09-12 DIAGNOSIS — R4189 Other symptoms and signs involving cognitive functions and awareness: Secondary | ICD-10-CM | POA: Diagnosis not present

## 2022-09-12 NOTE — Telephone Encounter (Signed)
     Patient  visit on 9/12  at [facility] you been able to follow up with your primary care physician?yes  The patient was or was not able to obtain any needed medicine or equipment. yes  Are there diet recommendations that you are having difficulty following? yes  Patient expresses understanding of discharge instructions and education provided has no other needs at this time.  yes    [address], Care Management  [phone], [place], [place] [address] Phone: [phone] Email: [email or web address]

## 2022-09-13 ENCOUNTER — Encounter: Payer: Self-pay | Admitting: Orthopedic Surgery

## 2022-09-13 ENCOUNTER — Non-Acute Institutional Stay: Payer: Medicare Other | Admitting: Orthopedic Surgery

## 2022-09-13 DIAGNOSIS — R4189 Other symptoms and signs involving cognitive functions and awareness: Secondary | ICD-10-CM | POA: Diagnosis not present

## 2022-09-13 DIAGNOSIS — Z9181 History of falling: Secondary | ICD-10-CM | POA: Diagnosis not present

## 2022-09-13 DIAGNOSIS — M6281 Muscle weakness (generalized): Secondary | ICD-10-CM | POA: Diagnosis not present

## 2022-09-13 DIAGNOSIS — G301 Alzheimer's disease with late onset: Secondary | ICD-10-CM

## 2022-09-13 DIAGNOSIS — F32A Depression, unspecified: Secondary | ICD-10-CM

## 2022-09-13 DIAGNOSIS — F02B18 Dementia in other diseases classified elsewhere, moderate, with other behavioral disturbance: Secondary | ICD-10-CM | POA: Diagnosis not present

## 2022-09-13 DIAGNOSIS — S2231XD Fracture of one rib, right side, subsequent encounter for fracture with routine healing: Secondary | ICD-10-CM | POA: Diagnosis not present

## 2022-09-13 DIAGNOSIS — S22000A Wedge compression fracture of unspecified thoracic vertebra, initial encounter for closed fracture: Secondary | ICD-10-CM | POA: Diagnosis not present

## 2022-09-13 DIAGNOSIS — S32030S Wedge compression fracture of third lumbar vertebra, sequela: Secondary | ICD-10-CM

## 2022-09-13 DIAGNOSIS — M545 Low back pain, unspecified: Secondary | ICD-10-CM | POA: Diagnosis not present

## 2022-09-13 DIAGNOSIS — R41841 Cognitive communication deficit: Secondary | ICD-10-CM | POA: Diagnosis not present

## 2022-09-13 DIAGNOSIS — R296 Repeated falls: Secondary | ICD-10-CM | POA: Diagnosis not present

## 2022-09-13 DIAGNOSIS — F419 Anxiety disorder, unspecified: Secondary | ICD-10-CM | POA: Diagnosis not present

## 2022-09-13 DIAGNOSIS — I4891 Unspecified atrial fibrillation: Secondary | ICD-10-CM | POA: Diagnosis not present

## 2022-09-13 NOTE — Progress Notes (Signed)
Location:  [facility] Room Number: 18/A Place of Service:  ALF [phone]) Provider:  [name], NP   [name], MD  Patient Care Team: [name], MD as PCP - General (Internal Medicine) Martinique, Peter M, MD as PCP - Cardiology (Cardiology)  Extended Emergency Contact Information Primary Emergency Contact: [name],[name] Address: [place]           [address] [name] [place] Phone: [phone] Mobile Phone: [phone] Relation: Sister Secondary Emergency Contact: [name],[name]  [name] [place] Phone: [phone] Mobile Phone: [phone] Relation: Niece  Code Status: DNR Goals of care: Advanced Directive information    09/09/2022   10:23 PM  Advanced Directives  Does Patient Have a Medical Advance Directive? Yes  Type of Advance Directive Out of facility DNR (pink MOST or yellow form)  Does patient want to make changes to medical advance directive? No - Guardian declined  Pre-existing out of facility DNR order (yellow form or pink MOST form) Pink MOST/Yellow Form most recent copy in chart - Physician notified to receive inpatient order     Chief Complaint  Patient presents with   Acute Visit    Back pain, frequent falls    HPI:  Pt is a 88 y.o. female seen today for acute visit due to frequent falls and back pain.   09/20 she was found on the floor by nursing. No apparent injury. 09/12 she had a witnessed fall in shower. She c/o increased back pain after event. She was also noted to have hematoma to back of head. 09/13 xray spine revealed compression fracture of T6, T10 and L3. CXR negative for rib fracture. She was given tramadol, robaxin and lidocaine patches for pain. PT also ordered. She had 2 visits to ED this past weekend due to increased back pain. 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 She has been receiving scheduled oxycodone, low dose tylenol and lidocaine patches for pain. She has been observed with increased anxiety x 2 days. She has received 3 doses of ativan 0.5 mg. Also taking Zyprexa qhs and Zoloft. She is also working with PT/OT for frequent falls. Family is in the process of starting sitter services.    Past Medical History:  Diagnosis Date   Anxiety and depression    Aortic stenosis    a. 03/2014: Mild aortic stenosis by valve area and mean gradient though appeared more visually consistent with moderate AS.    CAD (coronary artery disease)    a. s/p prior PCI of D2 in 1992 b. stent to RCA in 2005   Diverticula of colon    GERD (gastroesophageal reflux disease)    HTN (hypertension)    Hypercholesteremia    Lateral myocardial infarction ([place]) 1992   Osteoarthritis    Osteopenia    Scoliosis    Past Surgical History:  Procedure Laterality Date   ABDOMINAL HYSTERECTOMY     APPENDECTOMY     CORONARY ANGIOPLASTY     TONSILLECTOMY      Allergies  Allergen Reactions   Latex Other (See Comments)    Unknown reaction - listed on [facility] 02/10/22   Nifedipine Other (See Comments)    Dark Urine; can only take orange tablet, allergic to yellow-brown tablet   Tetanus Toxoids Other (See Comments)    Unknown reaction   Tetanus-Diphtheria Toxoids Td Other (  See Comments)    Unknown reaction   Adhesive [Tape] Rash    Outpatient Encounter Medications as of 09/13/2022  Medication Sig   acetaminophen (TYLENOL) 500 MG tablet Take 500 mg by mouth in the morning, at noon, and at bedtime.   amLODipine (NORVASC) 5 MG tablet Take 5 mg by mouth daily.   atorvastatin (LIPITOR) 10 MG tablet Take 10 mg by mouth at bedtime.   cetirizine (ZYRTEC) 10 MG tablet Take 10 mg by mouth every morning.   cholecalciferol (VITAMIN D) 25 MCG (1000 UNIT) tablet Take 2,000 Units by mouth every evening.   ferrous sulfate 325 (65 FE) MG  tablet Take 1 tablet (325 mg total) by mouth daily with breakfast.   folic acid (FOLVITE) 1 MG tablet Take 1 tablet (1 mg total) by mouth daily.   hydrochlorothiazide (HYDRODIURIL) 25 MG tablet Take 25 mg by mouth in the morning.   LORazepam (ATIVAN) 0.5 MG tablet Take 1 tablet (0.5 mg total) by mouth 2 (two) times daily as needed for up to 14 days for anxiety (agitation).   losartan (COZAAR) 100 MG tablet Take 100 mg by mouth at bedtime.   methocarbamol (ROBAXIN) 500 MG tablet Take 250 mg by mouth every 6 (six) hours as needed for muscle spasms.   metoprolol succinate (TOPROL-XL) 50 MG 24 hr tablet Take 50 mg by mouth every evening. Take with or immediately following a meal.   nitroGLYCERIN (NITROSTAT) 0.4 MG SL tablet Place 1 tablet (0.4 mg total) under the tongue every 5 (five) minutes as needed for chest pain.   OLANZapine (ZYPREXA) 2.5 MG tablet Take 2.5 mg by mouth at bedtime.   oxyCODONE-acetaminophen (PERCOCET) 5-325 MG tablet Take 1 tablet by mouth in the morning and at bedtime for 14 days.   oxyCODONE-acetaminophen (PERCOCET/ROXICET) 5-325 MG tablet Take 1 tablet by mouth 2 (two) times daily as needed for severe pain.   pantoprazole (PROTONIX) 40 MG tablet Take 1 tablet (40 mg total) by mouth daily.   polyethylene glycol powder (GLYCOLAX/MIRALAX) 17 GM/SCOOP powder Take 17 g by mouth every other day.   potassium chloride (KLOR-CON M) 10 MEQ tablet Take 10 mEq by mouth daily.   senna (SENOKOT) 8.6 MG TABS tablet Take 1 tablet by mouth in the morning.   sertraline (ZOLOFT) 25 MG tablet Take 25 mg by mouth at bedtime.   traMADol (ULTRAM) 50 MG tablet Take 25 mg by mouth every 6 (six) hours as needed.   No facility-administered encounter medications on file as of 09/13/2022.    Review of Systems  Unable to perform ROS: Dementia    Immunization History  Administered Date(s) Administered   Influenza Split 05/13/2009, 05/18/2010, 09/29/2011, 09/05/2012, 09/03/2013   Influenza, High Dose  Seasonal PF 09/09/2015, 08/30/2017   Influenza, Quadrivalent, Recombinant, Inj, Pf 08/19/2018, 09/29/2019   Influenza-Unspecified 10/18/2021   Moderna Covid-19 Vaccine Bivalent Booster 1yr & up 09/14/2021   Moderna SARS-COV2 Booster Vaccination 06/01/2021   Moderna Sars-Covid-2 Vaccination 12/29/2019, 01/26/2020   Pneumococcal Conjugate-13 07/08/2013   Pneumococcal Polysaccharide-23 06/29/2006, 05/13/2009, 05/18/2010, 05/25/2011   Td 05/13/2009, 05/18/2010, 05/25/2011   Zoster Recombinat (Shingrix) 08/24/2017, 10/31/2017   Zoster, Live 08/22/2008, 08/24/2008, 05/13/2009, 05/18/2010, 05/25/2011   Pertinent  Health Maintenance Due  Topic Date Due   INFLUENZA VACCINE  07/25/2022   DEXA SCAN  Completed      02/11/2022    8:00 AM 03/20/2022   10:22 AM 09/07/2022   12:11 PM 09/08/2022    3:52 AM 09/09/2022   10:24 PM  Fall Risk  Falls in the past year?  1 0    Was there an injury with Fall?  0 0    Fall Risk Category Calculator  1 0    Fall Risk Category  Low Low    Patient Fall Risk Level Moderate fall risk Moderate fall risk Low fall risk Moderate fall risk High fall risk  Patient at Risk for Falls Due to  History of fall(s);Impaired balance/gait;Mental status change;Other (Comment) No Fall Risks    Patient at Risk for Falls Due to - Comments  vascular dementia     Fall risk Follow up  Falls evaluation completed;Education provided;Falls prevention discussed Falls evaluation completed     Functional Status Survey:    Vitals:   09/13/22 1456  BP: (!) 116/92  Pulse: 84  Resp: 20  Temp: 98.2 F (36.8 C)  SpO2: 97%  Weight: 126 lb 6.4 oz (57.3 kg)   Body mass index is 21.7 kg/m. Physical Exam Vitals reviewed.  Constitutional:      General: She is not in acute distress. HENT:     Head: Normocephalic.  Eyes:     General:        Right eye: No discharge.        Left eye: No discharge.     Extraocular Movements: Extraocular movements intact.     Pupils: Pupils are equal,  round, and reactive to light.  Cardiovascular:     Rate and Rhythm: Normal rate. Rhythm irregular.     Pulses: Normal pulses.     Heart sounds: Normal heart sounds.  Pulmonary:     Effort: Pulmonary effort is normal. No respiratory distress.     Breath sounds: Normal breath sounds. No wheezing.  Abdominal:     General: Bowel sounds are normal.     Palpations: Abdomen is soft.     Tenderness: There is no abdominal tenderness.  Musculoskeletal:     Cervical back: Neck supple.     Right lower leg: No edema.     Left lower leg: No edema.  Skin:    General: Skin is warm and dry.     Capillary Refill: Capillary refill takes less than 2 seconds.     Findings: Lesion present.     Comments: Approx 1 cm skin tear to right forearm, CDI, open to air  Neurological:     General: No focal deficit present.     Mental Status: She is alert. Mental status is at baseline.     Sensory: Sensory deficit present.     Motor: Weakness present.     Gait: Gait abnormal.     Comments: walker  Psychiatric:        Mood and Affect: Mood is anxious.        Behavior: Behavior normal.        Cognition and Memory: Cognition is impaired. Memory is impaired.     Comments: Follows commands, alert to self and familiar face     Labs reviewed: Recent Labs    02/11/22 0108 06/05/22 0000 09/08/22 0422 09/11/22 0730  NA 138 142 134* 135  K 3.0* 3.9 3.2* 3.4*  CL 106 107 101 96*  CO2 24 29* 25 29  GLUCOSE 110*  --  106* 67  BUN '11 16 10 16  '$ CREATININE 0.64 0.5 0.38* 0.62  CALCIUM 8.3* 8.7 8.9 9.0   Recent Labs    11/24/21 0000 02/09/22 0000 02/10/22 1303 09/11/22 0730  AST '16 23 28 '$ 18  ALT '12 22 25 11  '$ ALKPHOS 106 90 97  --   BILITOT  --   --  0.5 0.6  PROT  --   --  6.3* 6.6  ALBUMIN 3.2* 3.7 3.2*  --    Recent Labs    02/11/22 0108 02/20/22 0000 03/09/22 0000 06/05/22 0000 09/08/22 0422 09/11/22 0730  WBC 5.8   < > 3.7 4.2 6.7 5.5  NEUTROABS 4.1   < > 1,550.00  --  5.0 3,801  HGB 8.0*    < > 10.7* 12.5 14.1 14.5  HCT 26.6*   < > 36 39 43.2 42.1  MCV 80.4  --   --   --  95.6 89.6  PLT 216   < > 266 209 220 236   < > = values in this interval not displayed.   Lab Results  Component Value Date   TSH 2.90 11/24/2021   No results found for: "HGBA1C" Lab Results  Component Value Date   CHOL 86 01/23/2022   HDL 43 01/23/2022   LDLCALC 27 01/23/2022   TRIG 77 01/23/2022   CHOLHDL 1.9 09/17/2020    Significant Diagnostic Results in last 30 days:  DG Ribs Unilateral W/Chest Right  Result Date: 09/10/2022 CLINICAL DATA:  Trauma with rib pain EXAM: RIGHT RIBS AND CHEST - 3+ VIEW COMPARISON:  None Available. FINDINGS: No fracture or other bone lesions are seen involving the ribs. There is no evidence of pneumothorax or pleural effusion. Both lungs are clear. Heart size and mediastinal contours are within normal limits. S shaped scoliosis. IMPRESSION: No rib fracture. Clear lungs. Electronically Signed   By: Ulyses Jarred M.D.   On: 09/10/2022 00:13   CT CHEST ABDOMEN PELVIS W CONTRAST  Result Date: 09/08/2022 CLINICAL DATA:  Left rib pain after unwitnessed fall 2 days ago. Back pain. EXAM: CT CHEST, ABDOMEN, AND PELVIS WITH CONTRAST TECHNIQUE: Multidetector CT imaging of the chest, abdomen and pelvis was performed following the standard protocol during bolus administration of intravenous contrast. RADIATION DOSE REDUCTION: This exam was performed according to the departmental dose-optimization program which includes automated exposure control, adjustment of the mA and/or kV according to patient size and/or use of iterative reconstruction technique. CONTRAST:  65m OMNIPAQUE IOHEXOL 300 MG/ML  SOLN COMPARISON:  Chest CT 05/02/2021 FINDINGS: CT CHEST FINDINGS Cardiovascular: Heart size upper normal. No substantial pericardial effusion. Coronary artery calcification is evident. Aortic valve calcification evident. Moderate atherosclerotic calcification is noted in the wall of the thoracic  aorta. No large central pulmonary embolus. Mediastinum/Nodes: No mediastinal lymphadenopathy. There is no hilar lymphadenopathy. Moderate to large hiatal hernia noted. There is no axillary lymphadenopathy. Lungs/Pleura: Scattered areas of peripheral clustered tree-in-bud micro nodularity identified in both lungs, suggesting sequelae of atypical infection. Left lower lobe compressive atelectasis noted adjacent to the hiatal hernia. No overtly suspicious pulmonary nodule or mass. No focal airspace consolidation. There is no evidence of pleural effusion. Musculoskeletal: Nonacute rib fractures are identified posteriorly. Nonacute fracture nonunion identified posterior right ninth rib. Thoracic spine compression fractures are noted at T3, T7, and T11. CT ABDOMEN PELVIS FINDINGS Hepatobiliary: No suspicious focal abnormality within the liver parenchyma. There is no evidence for gallstones, gallbladder wall thickening, or pericholecystic fluid. No intrahepatic or extrahepatic biliary dilation. Pancreas: No focal mass lesion. No dilatation of the main duct. No intraparenchymal cyst. No peripancreatic edema. Spleen: No splenomegaly. No focal mass lesion. Adrenals/Urinary Tract: No adrenal nodule or mass. Kidneys unremarkable. No evidence for hydroureter. The urinary bladder appears normal  for the degree of distention. Stomach/Bowel: Large hiatal hernia contains 50-75% of the stomach. Duodenum is normally positioned as is the ligament of Treitz. No small bowel wall thickening. No small bowel dilatation. The terminal ileum is normal. The appendix is not well visualized, but there is no edema or inflammation in the region of the cecum. No gross colonic mass. No colonic wall thickening. Diverticuli are seen scattered along the entire length of the colon without CT findings of diverticulitis. Diverticular disease is most advanced in the sigmoid segment. Vascular/Lymphatic: There is advanced atherosclerotic calcification of the  abdominal aorta. Fusiform dilatation of the infrarenal abdominal aorta up to 3.0 cm diameter evident. There is no gastrohepatic or hepatoduodenal ligament lymphadenopathy. No retroperitoneal or mesenteric lymphadenopathy. No pelvic sidewall lymphadenopathy. Reproductive: The uterus is surgically absent. There is no adnexal mass. Other: No intraperitoneal free fluid. Musculoskeletal: No worrisome lytic or sclerotic osseous abnormality. Thoracolumbar scoliosis evident. IMPRESSION: 1. No acute findings in the chest, abdomen, or pelvis. 2. Scattered areas of peripheral clustered tree-in-bud micro nodularity in both lungs, suggesting sequelae of atypical infection. Findings are improved compared to chest CT 05/02/2021. 3. Moderate to large hiatal hernia contains 50-75% of the stomach. 4. 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 5. Thoracic spine compression fractures at T3, T7, and T11. 6. Fusiform dilatation of the infrarenal abdominal aorta up to 3.0 cm diameter. Recommend follow-up ultrasound every 3 years. This recommendation follows ACR consensus guidelines: White Paper of the ACR Incidental Findings Committee II on Vascular Findings. J Am Coll Radiol 2013; 10:789-794. 7. Colonic diverticulosis without diverticulitis. 8. Aortic Atherosclerosis (ICD10-I70.0). Electronically Signed   By: Misty Stanley M.D.   On: 09/08/2022 06:45   CT CERVICAL SPINE WO CONTRAST  Result Date: 09/08/2022 CLINICAL DATA:  88 year old female status post unwitnessed fall 2 days ago. Rib pain. EXAM: CT CERVICAL SPINE WITHOUT CONTRAST TECHNIQUE: Multidetector CT imaging of the cervical spine was performed without intravenous contrast. Multiplanar CT image reconstructions were also generated. RADIATION DOSE REDUCTION: This exam was performed according to the departmental dose-optimization program which includes automated exposure control, adjustment of the mA and/or kV according to patient  size and/or use of iterative reconstruction technique. COMPARISON:  Cervical spine CT 12/15/2020. FINDINGS: Alignment: Mildly exaggerated cervical lordosis has not significantly changed since 2021, with mild chronic anterolisthesis at both C2-C3 and C6-C7. Subtle chronic anterolisthesis at C7-T1. Bilateral posterior element alignment is within normal limits. Skull base and vertebrae: Osteopenia. Visualized skull base is intact. No atlanto-occipital dissociation. C1 and C2 appear stable. Stable chronic C6 mild vertebral loss of height. No acute osseous abnormality identified. Soft tissues and spinal canal: No prevertebral fluid or swelling. No visible canal hematoma. Stable visible noncontrast neck soft tissues, calcified carotid atherosclerosis. Evidence of chronic left vertebral artery tortuosity. Disc levels: Advanced chronic cervical spine degeneration appears stable since 2021. Mild multifactorial spinal stenosis suspected at C3-C4. Upper chest: Chest CT today is reported separately. IMPRESSION: 1. No acute traumatic injury identified in the cervical spine. 2. Osteopenia and advanced chronic cervical spine degeneration appear stable since 2021. 3. Chest CT today is reported separately. Electronically Signed   By: Genevie Ann M.D.   On: 09/08/2022 06:44   CT HEAD WO CONTRAST (5MM)  Result Date: 09/08/2022 CLINICAL DATA:  88 year old female status post unwitnessed fall 2 days ago. Rib pain. EXAM: CT HEAD WITHOUT CONTRAST TECHNIQUE: Contiguous axial images were obtained from the base of the skull through the vertex without intravenous contrast. RADIATION  DOSE REDUCTION: This exam was performed according to the departmental dose-optimization program which includes automated exposure control, adjustment of the mA and/or kV according to patient size and/or use of iterative reconstruction technique. COMPARISON:  Head CT 05/02/2021. FINDINGS: Brain: Stable cerebral volume. No midline shift, ventriculomegaly,  intracranial hemorrhage or evidence of cortically based acute infarction. Patchy mild to moderate for age bilateral cerebral white matter hypodensity appears stable from last year. Subtle chronic rounded and mildly hyperdense right posterior para falcine meningioma is up to 22 mm long axis (coronal image 48) but not significantly changed since 2021. No associated cerebral edema, and no significant regional mass effect. Vascular: Calcified atherosclerosis at the skull base. No suspicious intracranial vascular hyperdensity. Dominant appearing distal left vertebral artery. Skull: Stable.  No acute osseous abnormality identified. Sinuses/Orbits: Visualized paranasal sinuses and mastoids are stable and well aerated. Other: No acute orbit or scalp soft tissue finding. There is some chronic scalp vessel calcified atherosclerosis. IMPRESSION: 1. No acute intracranial abnormality or acute traumatic injury identified. 2. 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 No cerebral edema, suggesting this is clinically silent. 3. Stable mild to moderate for age cerebral white matter changes, most commonly due to chronic small vessel disease. Electronically Signed   By: Genevie Ann M.D.   On: 09/08/2022 06:40   DG Ribs Bilateral W/Chest  Result Date: 09/08/2022 CLINICAL DATA:  88 year old female status post unwitnessed fall. Upper back pain. EXAM: BILATERAL RIBS AND CHEST - 4+ VIEW COMPARISON:  Rib radiographs 12/14/2021.  Chest CT 05/02/2021. FINDINGS: Semi upright AP view of the chest at 0435 hours. Stable cardiomegaly and mediastinal contours. Chronic sternotomy. Moderate to large chronic gastric hiatal hernia better demonstrated by CT. Pronounced Calcified aortic atherosclerosis. Lower lung volumes compared to last year. Vague increased bibasilar opacity with no air bronchograms. No pneumothorax. Pulmonary vascularity appears stable. Osteopenia. Multiple  chronic left lateral rib fractures, demonstrated by CT last year. No acute displaced rib fracture identified. Other visible osseous structures appear grossly intact, chronic thoracolumbar scoliosis. Paucity of bowel gas in the upper abdomen. IMPRESSION: 1. No acute displaced rib fracture identified. Multiple previous left lateral rib fractures as seen by CT last year. 2. Chronic cardiomegaly, hiatal hernia, Aortic Atherosclerosis (ICD10-I70.0). 3. Lower lung volumes with vague bibasilar opacity, favor atelectasis. Electronically Signed   By: Genevie Ann M.D.   On: 09/08/2022 05:11    Assessment/Plan 1. Compression fracture of body of thoracic vertebra ([place]) - 09/13 xray revealed fracture to T6, T10, L3 - 09/15 CT chest noted fracture to T3, T7, T11 - cont tylenol 500 mg po TID - cont Percocet 5/325 mg po BID x 14 days - cont Percocet 5/325 mg po BID prn for breakthrough pain - cont lidocaine patches x 2 - cont miralax QOD - cont senna - cont PT/OT  2. Compression fracture of L3 lumbar vertebra, sequela - see above  3. Closed fracture of one rib of right side with routine healing, subsequent encounter - see above  4. Moderate late onset Alzheimer's dementia with other behavioral disturbance ([place]) - increased anxiety  - family plans to start sitter services - increase Zyprexa to BID - cont ativan prn  5. Frequent falls - see above - cont PT/OT  6. Anxiety and depression - increased anxiety since compression fractures - increase Zoloft to 50 mg daily - bmp in 1 week   Family/ staff Communication: plan discussed with patient, sister and  nurse  Labs/tests ordered:  bmp in 1 week

## 2022-09-14 DIAGNOSIS — Z9181 History of falling: Secondary | ICD-10-CM | POA: Diagnosis not present

## 2022-09-14 DIAGNOSIS — I4891 Unspecified atrial fibrillation: Secondary | ICD-10-CM | POA: Diagnosis not present

## 2022-09-14 DIAGNOSIS — R4189 Other symptoms and signs involving cognitive functions and awareness: Secondary | ICD-10-CM | POA: Diagnosis not present

## 2022-09-14 DIAGNOSIS — M6281 Muscle weakness (generalized): Secondary | ICD-10-CM | POA: Diagnosis not present

## 2022-09-14 DIAGNOSIS — R41841 Cognitive communication deficit: Secondary | ICD-10-CM | POA: Diagnosis not present

## 2022-09-14 DIAGNOSIS — M545 Low back pain, unspecified: Secondary | ICD-10-CM | POA: Diagnosis not present

## 2022-09-15 ENCOUNTER — Non-Acute Institutional Stay: Payer: Medicare Other | Admitting: Orthopedic Surgery

## 2022-09-15 ENCOUNTER — Encounter: Payer: Self-pay | Admitting: Orthopedic Surgery

## 2022-09-15 DIAGNOSIS — F02B18 Dementia in other diseases classified elsewhere, moderate, with other behavioral disturbance: Secondary | ICD-10-CM | POA: Diagnosis not present

## 2022-09-15 DIAGNOSIS — S2231XD Fracture of one rib, right side, subsequent encounter for fracture with routine healing: Secondary | ICD-10-CM | POA: Diagnosis not present

## 2022-09-15 DIAGNOSIS — R4189 Other symptoms and signs involving cognitive functions and awareness: Secondary | ICD-10-CM | POA: Diagnosis not present

## 2022-09-15 DIAGNOSIS — G301 Alzheimer's disease with late onset: Secondary | ICD-10-CM

## 2022-09-15 DIAGNOSIS — S22000A Wedge compression fracture of unspecified thoracic vertebra, initial encounter for closed fracture: Secondary | ICD-10-CM

## 2022-09-15 DIAGNOSIS — R41841 Cognitive communication deficit: Secondary | ICD-10-CM | POA: Diagnosis not present

## 2022-09-15 DIAGNOSIS — Z9181 History of falling: Secondary | ICD-10-CM | POA: Diagnosis not present

## 2022-09-15 DIAGNOSIS — M545 Low back pain, unspecified: Secondary | ICD-10-CM | POA: Diagnosis not present

## 2022-09-15 DIAGNOSIS — N3000 Acute cystitis without hematuria: Secondary | ICD-10-CM

## 2022-09-15 DIAGNOSIS — M6281 Muscle weakness (generalized): Secondary | ICD-10-CM | POA: Diagnosis not present

## 2022-09-15 DIAGNOSIS — S32030S Wedge compression fracture of third lumbar vertebra, sequela: Secondary | ICD-10-CM

## 2022-09-15 DIAGNOSIS — I4891 Unspecified atrial fibrillation: Secondary | ICD-10-CM | POA: Diagnosis not present

## 2022-09-15 MED ORDER — CIPROFLOXACIN HCL 500 MG PO TABS: 500.0000 mg | ORAL_TABLET | Freq: Two times a day (BID) | ORAL | 0 refills | Status: DC

## 2022-09-15 NOTE — Progress Notes (Signed)
Location:  [facility] Room Number: 18/A Place of Service:  [phone]) Provider:  [name], NP   [name], MD  Patient Care Team: [name], MD as PCP - General (Internal Medicine) Martinique, Peter M, MD as PCP - Cardiology (Cardiology)  Extended Emergency Contact Information Primary Emergency Contact: [name],[name] Address: [address]          [place] [address] [name] of [place] Phone: [phone] Mobile Phone: [phone] Relation: Sister Secondary Emergency Contact: [name],[name]  [name] of [place] Phone: [phone] Mobile Phone: [phone] Relation: Niece  Code Status:  DNR Goals of care: Advanced Directive information    09/09/2022   10:23 PM  Advanced Directives  Does Patient Have a Medical Advance Directive? Yes  Type of Advance Directive Out of facility DNR (pink MOST or yellow form)  Does patient want to make changes to medical advance directive? No - Guardian declined  Pre-existing out of facility DNR order (yellow form or pink MOST form) Pink MOST/Yellow Form most recent copy in chart - Physician notified to receive inpatient order     Chief Complaint  Patient presents with   Acute Visit    Dysuria     HPI:  Pt is a 88 y.o. female seen today for acute visit due to dysuria and confusion.   09/20 she had a witnessed fall in shower. 09/13 xray spine revealed compression fracture of T6, T10 and L3. CXR negative for rib fracture. She had 2 visits to ED this past weekend due to increased back pain. 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 She has been receiving scheduled percocet, low dose tylenol and lidocaine patches for pain. Since incident, she has been more confused. Family has hired Adult nurse. 09/18 urine culture revealed >  100,000 CFU/mL E.coli.    Past Medical History:  Diagnosis Date   Anxiety and depression    Aortic stenosis    a. 03/2014: Mild aortic stenosis by valve area and mean gradient though appeared more visually consistent with moderate AS.    CAD (coronary artery disease)    a. s/p prior PCI of D2 in 1992 b. stent to RCA in 2005   Diverticula of colon    GERD (gastroesophageal reflux disease)    HTN (hypertension)    Hypercholesteremia    Lateral myocardial infarction ([place]) 1992   Osteoarthritis    Osteopenia    Scoliosis    Past Surgical History:  Procedure Laterality Date   ABDOMINAL HYSTERECTOMY     APPENDECTOMY     CORONARY ANGIOPLASTY     TONSILLECTOMY      Allergies  Allergen Reactions   Latex Other (See Comments)    Unknown reaction - listed on [facility] 02/10/22   Nifedipine Other (See Comments)    Dark Urine; can only take orange tablet, allergic to yellow-brown tablet   Tetanus Toxoids Other (See Comments)    Unknown reaction   Tetanus-Diphtheria Toxoids Td Other (See Comments)    Unknown reaction   Adhesive [Tape] Rash    Outpatient Encounter Medications as of 09/15/2022  Medication Sig   acetaminophen (TYLENOL) 500 MG tablet Take 500 mg by mouth in the morning, at noon, and at bedtime.   amLODipine (NORVASC) 5 MG tablet Take 5 mg by mouth daily.   atorvastatin (LIPITOR) 10 MG tablet Take 10 mg by mouth  at bedtime.   cetirizine (ZYRTEC) 10 MG tablet Take 10 mg by mouth every morning.   cholecalciferol (VITAMIN D) 25 MCG (1000 UNIT) tablet Take 2,000 Units by mouth every evening.   ferrous sulfate 325 (65 FE) MG tablet Take 1 tablet (325 mg total) by mouth daily with breakfast.   folic acid (FOLVITE) 1 MG tablet Take 1 tablet (1 mg total) by mouth daily.   hydrochlorothiazide (HYDRODIURIL) 25 MG tablet Take 25 mg by mouth in the morning.   LORazepam (ATIVAN) 0.5 MG tablet Take 1 tablet (0.5 mg total) by mouth 2 (two) times daily as needed for up to 14 days for anxiety  (agitation).   losartan (COZAAR) 100 MG tablet Take 100 mg by mouth at bedtime.   methocarbamol (ROBAXIN) 500 MG tablet Take 250 mg by mouth every 6 (six) hours as needed for muscle spasms.   metoprolol succinate (TOPROL-XL) 50 MG 24 hr tablet Take 50 mg by mouth every evening. Take with or immediately following a meal.   nitroGLYCERIN (NITROSTAT) 0.4 MG SL tablet Place 1 tablet (0.4 mg total) under the tongue every 5 (five) minutes as needed for chest pain.   OLANZapine (ZYPREXA) 2.5 MG tablet Take 2.5 mg by mouth in the morning and at bedtime.   oxyCODONE-acetaminophen (PERCOCET) 5-325 MG tablet Take 1 tablet by mouth in the morning and at bedtime for 14 days.   oxyCODONE-acetaminophen (PERCOCET/ROXICET) 5-325 MG tablet Take 1 tablet by mouth 2 (two) times daily as needed for severe pain.   pantoprazole (PROTONIX) 40 MG tablet Take 1 tablet (40 mg total) by mouth daily.   polyethylene glycol powder (GLYCOLAX/MIRALAX) 17 GM/SCOOP powder Take 17 g by mouth every other day.   potassium chloride (KLOR-CON M) 10 MEQ tablet Take 10 mEq by mouth daily.   senna (SENOKOT) 8.6 MG TABS tablet Take 1 tablet by mouth in the morning.   sertraline (ZOLOFT) 25 MG tablet Take 50 mg by mouth at bedtime.   No facility-administered encounter medications on file as of 09/15/2022.    Review of Systems  Unable to perform ROS: Dementia    Immunization History  Administered Date(s) Administered   Influenza Split 05/13/2009, 05/18/2010, 09/29/2011, 09/05/2012, 09/03/2013   Influenza, High Dose Seasonal PF 09/09/2015, 08/30/2017   Influenza, Quadrivalent, Recombinant, Inj, Pf 08/19/2018, 09/29/2019   Influenza-Unspecified 10/18/2021   Moderna Covid-19 Vaccine Bivalent Booster 52yr & up 09/14/2021   Moderna SARS-COV2 Booster Vaccination 06/01/2021   Moderna Sars-Covid-2 Vaccination 12/29/2019, 01/26/2020   Pneumococcal Conjugate-13 07/08/2013   Pneumococcal Polysaccharide-23 06/29/2006, 05/13/2009, 05/18/2010,  05/25/2011   Td 05/13/2009, 05/18/2010, 05/25/2011   Zoster Recombinat (Shingrix) 08/24/2017, 10/31/2017   Zoster, Live 08/22/2008, 08/24/2008, 05/13/2009, 05/18/2010, 05/25/2011   Pertinent  Health Maintenance Due  Topic Date Due   INFLUENZA VACCINE  07/25/2022   DEXA SCAN  Completed      02/11/2022    8:00 AM 03/20/2022   10:22 AM 09/07/2022   12:11 PM 09/08/2022    3:52 AM 09/09/2022   10:24 PM  Fall Risk  Falls in the past year?  1 0    Was there an injury with Fall?  0 0    Fall Risk Category Calculator  1 0    Fall Risk Category  Low Low    Patient Fall Risk Level Moderate fall risk Moderate fall risk Low fall risk Moderate fall risk High fall risk  Patient at Risk for Falls Due to  History of fall(s);Impaired balance/gait;Mental status change;Other (Comment) No Fall  Risks    Patient at Risk for Falls Due to - Comments  vascular dementia     Fall risk Follow up  Falls evaluation completed;Education provided;Falls prevention discussed Falls evaluation completed     Functional Status Survey:    Vitals:   09/15/22 1421  BP: (!) 163/89  Pulse: 90  Resp: (!) 22  Temp: 98.4 F (36.9 C)  SpO2: 94%  Weight: 126 lb 6.4 oz (57.3 kg)   Body mass index is 21.7 kg/m. Physical Exam Vitals reviewed.  Constitutional:      General: She is not in acute distress. HENT:     Head: Normocephalic.  Eyes:     General:        Right eye: No discharge.        Left eye: No discharge.  Cardiovascular:     Rate and Rhythm: Normal rate. Rhythm irregular.     Pulses: Normal pulses.     Heart sounds: Normal heart sounds.  Pulmonary:     Effort: Pulmonary effort is normal. No respiratory distress.     Breath sounds: Normal breath sounds. No wheezing.  Abdominal:     General: Bowel sounds are normal. There is no distension.     Palpations: Abdomen is soft.     Tenderness: There is no abdominal tenderness.  Musculoskeletal:     Cervical back: Neck supple.     Right lower leg: No edema.      Left lower leg: No edema.  Skin:    General: Skin is warm and dry.     Capillary Refill: Capillary refill takes less than 2 seconds.  Neurological:     General: No focal deficit present.     Mental Status: She is alert. Mental status is at baseline.     Motor: Weakness present.     Gait: Gait abnormal.     Comments: walker  Psychiatric:        Mood and Affect: Mood normal.        Behavior: Behavior normal.     Comments: Follows commands, alert to self/familiar face     Labs reviewed: Recent Labs    02/11/22 0108 06/05/22 0000 09/08/22 0422 09/11/22 0730  NA 138 142 134* 135  K 3.0* 3.9 3.2* 3.4*  CL 106 107 101 96*  CO2 24 29* 25 29  GLUCOSE 110*  --  106* 67  BUN '11 16 10 16  '$ CREATININE 0.64 0.5 0.38* 0.62  CALCIUM 8.3* 8.7 8.9 9.0   Recent Labs    11/24/21 0000 02/09/22 0000 02/10/22 1303 09/11/22 0730  AST '16 23 28 18  '$ ALT '12 22 25 11  '$ ALKPHOS 106 90 97  --   BILITOT  --   --  0.5 0.6  PROT  --   --  6.3* 6.6  ALBUMIN 3.2* 3.7 3.2*  --    Recent Labs    02/11/22 0108 02/20/22 0000 03/09/22 0000 06/05/22 0000 09/08/22 0422 09/11/22 0730  WBC 5.8   < > 3.7 4.2 6.7 5.5  NEUTROABS 4.1   < > 1,550.00  --  5.0 3,801  HGB 8.0*   < > 10.7* 12.5 14.1 14.5  HCT 26.6*   < > 36 39 43.2 42.1  MCV 80.4  --   --   --  95.6 89.6  PLT 216   < > 266 209 220 236   < > = values in this interval not displayed.   Lab Results  Component Value Date  TSH 2.90 11/24/2021   No results found for: "HGBA1C" Lab Results  Component Value Date   CHOL 86 01/23/2022   HDL 43 01/23/2022   LDLCALC 27 01/23/2022   TRIG 77 01/23/2022   CHOLHDL 1.9 09/17/2020    Significant Diagnostic Results in last 30 days:  DG Ribs Unilateral W/Chest Right  Result Date: 09/10/2022 CLINICAL DATA:  Trauma with rib pain EXAM: RIGHT RIBS AND CHEST - 3+ VIEW COMPARISON:  None Available. FINDINGS: No fracture or other bone lesions are seen involving the ribs. There is no evidence of  pneumothorax or pleural effusion. Both lungs are clear. Heart size and mediastinal contours are within normal limits. S shaped scoliosis. IMPRESSION: No rib fracture. Clear lungs. Electronically Signed   By: Ulyses Jarred M.D.   On: 09/10/2022 00:13   CT CHEST ABDOMEN PELVIS W CONTRAST  Result Date: 09/08/2022 CLINICAL DATA:  Left rib pain after unwitnessed fall 2 days ago. Back pain. EXAM: CT CHEST, ABDOMEN, AND PELVIS WITH CONTRAST TECHNIQUE: Multidetector CT imaging of the chest, abdomen and pelvis was performed following the standard protocol during bolus administration of intravenous contrast. RADIATION DOSE REDUCTION: This exam was performed according to the departmental dose-optimization program which includes automated exposure control, adjustment of the mA and/or kV according to patient size and/or use of iterative reconstruction technique. CONTRAST:  63m OMNIPAQUE IOHEXOL 300 MG/ML  SOLN COMPARISON:  Chest CT 05/02/2021 FINDINGS: CT CHEST FINDINGS Cardiovascular: Heart size upper normal. No substantial pericardial effusion. Coronary artery calcification is evident. Aortic valve calcification evident. Moderate atherosclerotic calcification is noted in the wall of the thoracic aorta. No large central pulmonary embolus. Mediastinum/Nodes: No mediastinal lymphadenopathy. There is no hilar lymphadenopathy. Moderate to large hiatal hernia noted. There is no axillary lymphadenopathy. Lungs/Pleura: Scattered areas of peripheral clustered tree-in-bud micro nodularity identified in both lungs, suggesting sequelae of atypical infection. Left lower lobe compressive atelectasis noted adjacent to the hiatal hernia. No overtly suspicious pulmonary nodule or mass. No focal airspace consolidation. There is no evidence of pleural effusion. Musculoskeletal: Nonacute rib fractures are identified posteriorly. Nonacute fracture nonunion identified posterior right ninth rib. Thoracic spine compression fractures are noted at  T3, T7, and T11. CT ABDOMEN PELVIS FINDINGS Hepatobiliary: No suspicious focal abnormality within the liver parenchyma. There is no evidence for gallstones, gallbladder wall thickening, or pericholecystic fluid. No intrahepatic or extrahepatic biliary dilation. Pancreas: No focal mass lesion. No dilatation of the main duct. No intraparenchymal cyst. No peripancreatic edema. Spleen: No splenomegaly. No focal mass lesion. Adrenals/Urinary Tract: No adrenal nodule or mass. Kidneys unremarkable. No evidence for hydroureter. The urinary bladder appears normal for the degree of distention. Stomach/Bowel: Large hiatal hernia contains 50-75% of the stomach. Duodenum is normally positioned as is the ligament of Treitz. No small bowel wall thickening. No small bowel dilatation. The terminal ileum is normal. The appendix is not well visualized, but there is no edema or inflammation in the region of the cecum. No gross colonic mass. No colonic wall thickening. Diverticuli are seen scattered along the entire length of the colon without CT findings of diverticulitis. Diverticular disease is most advanced in the sigmoid segment. Vascular/Lymphatic: There is advanced atherosclerotic calcification of the abdominal aorta. Fusiform dilatation of the infrarenal abdominal aorta up to 3.0 cm diameter evident. There is no gastrohepatic or hepatoduodenal ligament lymphadenopathy. No retroperitoneal or mesenteric lymphadenopathy. No pelvic sidewall lymphadenopathy. Reproductive: The uterus is surgically absent. There is no adnexal mass. Other: No intraperitoneal free fluid. Musculoskeletal: No worrisome lytic or sclerotic  osseous abnormality. Thoracolumbar scoliosis evident. IMPRESSION: 1. No acute findings in the chest, abdomen, or pelvis. 2. Scattered areas of peripheral clustered tree-in-bud micro nodularity in both lungs, suggesting sequelae of atypical infection. Findings are improved compared to chest CT 05/02/2021. 3. Moderate to  large hiatal hernia contains 50-75% of the stomach. 4. 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 5. Thoracic spine compression fractures at T3, T7, and T11. 6. Fusiform dilatation of the infrarenal abdominal aorta up to 3.0 cm diameter. Recommend follow-up ultrasound every 3 years. This recommendation follows ACR consensus guidelines: White Paper of the ACR Incidental Findings Committee II on Vascular Findings. J Am Coll Radiol 2013; 10:789-794. 7. Colonic diverticulosis without diverticulitis. 8. Aortic Atherosclerosis (ICD10-I70.0). Electronically Signed   By: Misty Stanley M.D.   On: 09/08/2022 06:45   CT CERVICAL SPINE WO CONTRAST  Result Date: 09/08/2022 CLINICAL DATA:  88 year old female status post unwitnessed fall 2 days ago. Rib pain. EXAM: CT CERVICAL SPINE WITHOUT CONTRAST TECHNIQUE: Multidetector CT imaging of the cervical spine was performed without intravenous contrast. Multiplanar CT image reconstructions were also generated. RADIATION DOSE REDUCTION: This exam was performed according to the departmental dose-optimization program which includes automated exposure control, adjustment of the mA and/or kV according to patient size and/or use of iterative reconstruction technique. COMPARISON:  Cervical spine CT 12/15/2020. FINDINGS: Alignment: Mildly exaggerated cervical lordosis has not significantly changed since 2021, with mild chronic anterolisthesis at both C2-C3 and C6-C7. Subtle chronic anterolisthesis at C7-T1. Bilateral posterior element alignment is within normal limits. Skull base and vertebrae: Osteopenia. Visualized skull base is intact. No atlanto-occipital dissociation. C1 and C2 appear stable. Stable chronic C6 mild vertebral loss of height. No acute osseous abnormality identified. Soft tissues and spinal canal: No prevertebral fluid or swelling. No visible canal hematoma. Stable visible noncontrast neck soft tissues, calcified carotid  atherosclerosis. Evidence of chronic left vertebral artery tortuosity. Disc levels: Advanced chronic cervical spine degeneration appears stable since 2021. Mild multifactorial spinal stenosis suspected at C3-C4. Upper chest: Chest CT today is reported separately. IMPRESSION: 1. No acute traumatic injury identified in the cervical spine. 2. Osteopenia and advanced chronic cervical spine degeneration appear stable since 2021. 3. Chest CT today is reported separately. Electronically Signed   By: Genevie Ann M.D.   On: 09/08/2022 06:44   CT HEAD WO CONTRAST (5MM)  Result Date: 09/08/2022 CLINICAL DATA:  88 year old female status post unwitnessed fall 2 days ago. Rib pain. EXAM: CT HEAD WITHOUT CONTRAST TECHNIQUE: Contiguous axial images were obtained from the base of the skull through the vertex without intravenous contrast. RADIATION DOSE REDUCTION: This exam was performed according to the departmental dose-optimization program which includes automated exposure control, adjustment of the mA and/or kV according to patient size and/or use of iterative reconstruction technique. COMPARISON:  Head CT 05/02/2021. FINDINGS: Brain: Stable cerebral volume. No midline shift, ventriculomegaly, intracranial hemorrhage or evidence of cortically based acute infarction. Patchy mild to moderate for age bilateral cerebral white matter hypodensity appears stable from last year. Subtle chronic rounded and mildly hyperdense right posterior para falcine meningioma is up to 22 mm long axis (coronal image 48) but not significantly changed since 2021. No associated cerebral edema, and no significant regional mass effect. Vascular: Calcified atherosclerosis at the skull base. No suspicious intracranial vascular hyperdensity. Dominant appearing distal left vertebral artery. Skull: Stable.  No acute osseous abnormality identified. Sinuses/Orbits: Visualized paranasal sinuses and mastoids are stable and well aerated. Other: No acute orbit or  scalp soft tissue finding. There is some chronic scalp vessel calcified atherosclerosis. IMPRESSION: 1. No acute intracranial abnormality or acute traumatic injury identified. 2. 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 No cerebral edema, suggesting this is clinically silent. 3. Stable mild to moderate for age cerebral white matter changes, most commonly due to chronic small vessel disease. Electronically Signed   By: Genevie Ann M.D.   On: 09/08/2022 06:40   DG Ribs Bilateral W/Chest  Result Date: 09/08/2022 CLINICAL DATA:  88 year old female status post unwitnessed fall. Upper back pain. EXAM: BILATERAL RIBS AND CHEST - 4+ VIEW COMPARISON:  Rib radiographs 12/14/2021.  Chest CT 05/02/2021. FINDINGS: Semi upright AP view of the chest at 0435 hours. Stable cardiomegaly and mediastinal contours. Chronic sternotomy. Moderate to large chronic gastric hiatal hernia better demonstrated by CT. Pronounced Calcified aortic atherosclerosis. Lower lung volumes compared to last year. Vague increased bibasilar opacity with no air bronchograms. No pneumothorax. Pulmonary vascularity appears stable. Osteopenia. Multiple chronic left lateral rib fractures, demonstrated by CT last year. No acute displaced rib fracture identified. Other visible osseous structures appear grossly intact, chronic thoracolumbar scoliosis. Paucity of bowel gas in the upper abdomen. IMPRESSION: 1. No acute displaced rib fracture identified. Multiple previous left lateral rib fractures as seen by CT last year. 2. Chronic cardiomegaly, hiatal hernia, Aortic Atherosclerosis (ICD10-I70.0). 3. Lower lung volumes with vague bibasilar opacity, favor atelectasis. Electronically Signed   By: Genevie Ann M.D.   On: 09/08/2022 05:11    Assessment/Plan 1. Acute cystitis without hematuria - 09/18 urine culture > 100,000 CFU/mL E. Coli - encourage hydration with water - ciprofloxacin (CIPRO)  500 MG tablet; Take 1 tablet (500 mg total) by mouth 2 (two) times daily for 7 days.  Dispense: 14 tablet; Refill: 0  2. Compression fracture of body of thoracic vertebra ([place]) - 09/13 xray revealed fracture to T6, T10, L3 - 09/15 CT chest noted fracture to T3, T7, T11 - pain controlled today - cont tylenol 500 mg po TID - cont Percocet 5/325 mg po BID x 14 days - cont Percocet 5/325 mg po BID prn for breakthrough pain - cont lidocaine patches x 2 - cont miralax QOD - cont senna - cont PT/OT  3. Compression fracture of L3 lumbar vertebra, sequela - see above  4. Closed fracture of one rib of right side with routine healing, subsequent encounter - see above  5. Moderate late onset Alzheimer's dementia with other behavioral disturbance ([place]) - increased anxiety/confusion x 5 days - see above - sitter services started 09/22 - Zyprexa increased to BID - cont ativan     Family/ staff Communication: plan discussed with patient and nurse  Labs/tests ordered:  bmp in 1 week

## 2022-09-16 ENCOUNTER — Emergency Department (HOSPITAL_COMMUNITY)
Admission: EM | Admit: 2022-09-16 | Discharge: 2022-09-16 | Disposition: A | Discharge status: Home / Self Care | Payer: Medicare Other | Attending: Emergency Medicine | Admitting: Emergency Medicine

## 2022-09-16 ENCOUNTER — Emergency Department (HOSPITAL_COMMUNITY): Payer: Medicare Other

## 2022-09-16 ENCOUNTER — Other Ambulatory Visit: Payer: Self-pay

## 2022-09-16 DIAGNOSIS — S22000S Wedge compression fracture of unspecified thoracic vertebra, sequela: Secondary | ICD-10-CM

## 2022-09-16 DIAGNOSIS — S2243XS Multiple fractures of ribs, bilateral, sequela: Secondary | ICD-10-CM | POA: Insufficient documentation

## 2022-09-16 DIAGNOSIS — Z91040 Latex allergy status: Secondary | ICD-10-CM | POA: Insufficient documentation

## 2022-09-16 DIAGNOSIS — X58XXXS Exposure to other specified factors, sequela: Secondary | ICD-10-CM | POA: Diagnosis not present

## 2022-09-16 DIAGNOSIS — M6281 Muscle weakness (generalized): Secondary | ICD-10-CM | POA: Diagnosis not present

## 2022-09-16 DIAGNOSIS — S2249XS Multiple fractures of ribs, unspecified side, sequela: Secondary | ICD-10-CM

## 2022-09-16 DIAGNOSIS — J80 Acute respiratory distress syndrome: Secondary | ICD-10-CM | POA: Diagnosis not present

## 2022-09-16 DIAGNOSIS — W19XXXA Unspecified fall, initial encounter: Secondary | ICD-10-CM | POA: Diagnosis not present

## 2022-09-16 DIAGNOSIS — I10 Essential (primary) hypertension: Secondary | ICD-10-CM | POA: Diagnosis not present

## 2022-09-16 DIAGNOSIS — M549 Dorsalgia, unspecified: Secondary | ICD-10-CM | POA: Diagnosis not present

## 2022-09-16 DIAGNOSIS — R41841 Cognitive communication deficit: Secondary | ICD-10-CM | POA: Diagnosis not present

## 2022-09-16 DIAGNOSIS — S22060S Wedge compression fracture of T7-T8 vertebra, sequela: Secondary | ICD-10-CM | POA: Diagnosis not present

## 2022-09-16 DIAGNOSIS — S22080S Wedge compression fracture of T11-T12 vertebra, sequela: Secondary | ICD-10-CM | POA: Diagnosis not present

## 2022-09-16 DIAGNOSIS — R4189 Other symptoms and signs involving cognitive functions and awareness: Secondary | ICD-10-CM | POA: Diagnosis not present

## 2022-09-16 DIAGNOSIS — M1611 Unilateral primary osteoarthritis, right hip: Secondary | ICD-10-CM | POA: Diagnosis not present

## 2022-09-16 DIAGNOSIS — F29 Unspecified psychosis not due to a substance or known physiological condition: Secondary | ICD-10-CM | POA: Diagnosis not present

## 2022-09-16 DIAGNOSIS — R918 Other nonspecific abnormal finding of lung field: Secondary | ICD-10-CM | POA: Diagnosis not present

## 2022-09-16 DIAGNOSIS — Z9181 History of falling: Secondary | ICD-10-CM | POA: Diagnosis not present

## 2022-09-16 DIAGNOSIS — S22030S Wedge compression fracture of third thoracic vertebra, sequela: Secondary | ICD-10-CM | POA: Diagnosis not present

## 2022-09-16 DIAGNOSIS — R5383 Other fatigue: Secondary | ICD-10-CM | POA: Diagnosis not present

## 2022-09-16 DIAGNOSIS — R457 State of emotional shock and stress, unspecified: Secondary | ICD-10-CM | POA: Diagnosis not present

## 2022-09-16 DIAGNOSIS — S79911A Unspecified injury of right hip, initial encounter: Secondary | ICD-10-CM | POA: Diagnosis not present

## 2022-09-16 DIAGNOSIS — S2241XA Multiple fractures of ribs, right side, initial encounter for closed fracture: Secondary | ICD-10-CM | POA: Diagnosis not present

## 2022-09-16 DIAGNOSIS — M25551 Pain in right hip: Secondary | ICD-10-CM | POA: Diagnosis not present

## 2022-09-16 DIAGNOSIS — R4182 Altered mental status, unspecified: Secondary | ICD-10-CM | POA: Diagnosis not present

## 2022-09-16 DIAGNOSIS — M546 Pain in thoracic spine: Secondary | ICD-10-CM | POA: Diagnosis present

## 2022-09-16 DIAGNOSIS — M545 Low back pain, unspecified: Secondary | ICD-10-CM | POA: Diagnosis not present

## 2022-09-16 DIAGNOSIS — Z743 Need for continuous supervision: Secondary | ICD-10-CM | POA: Diagnosis not present

## 2022-09-16 DIAGNOSIS — I4891 Unspecified atrial fibrillation: Secondary | ICD-10-CM | POA: Diagnosis not present

## 2022-09-16 MED ORDER — KETOROLAC TROMETHAMINE 60 MG/2ML IM SOLN
30.0000 mg | Freq: Once | INTRAMUSCULAR | Status: AC
  Administered 2022-09-16: 30 mg via INTRAMUSCULAR
  Filled 2022-09-16: qty 2

## 2022-09-16 MED ORDER — LIDOCAINE 5 % EX PTCH: 1.0000 | MEDICATED_PATCH | CUTANEOUS | 0 refills | Status: DC

## 2022-09-16 NOTE — ED Provider Notes (Signed)
[facility] DEPT Provider Note   CSN: [id number] Arrival date & time: 09/16/22  3532     History  Chief Complaint  Patient presents with   Back Pain    Erin DEBELL is a 88 y.o. female.  The history is provided by the patient.  Back Pain Location:  Thoracic spine (has compression fractures T3, T7, T11 getting ativan and percocet at nursing home for pain.  Also has known rib fractures) Quality:  Aching Radiates to:  Does not radiate Pain severity:  Moderate Pain is:  Same all the time Onset quality:  Sudden Duration: weeks. Timing:  Constant Progression:  Unchanged Chronicity:  New Context: not pedestrian accident and not twisting   Relieved by:  Nothing Worsened by:  Nothing Ineffective treatments:  None tried Associated symptoms: no abdominal pain, no abdominal swelling, no bladder incontinence, no dysuria and no fever   Risk factors: no vascular disease        Home Medications Prior to Admission medications   Medication Sig Start Date End Date Taking? Authorizing Provider  acetaminophen (TYLENOL) 500 MG tablet Take 500 mg by mouth in the morning, at noon, and at bedtime.    [provider]  amLODipine (NORVASC) 5 MG tablet Take 5 mg by mouth daily.    [provider]  atorvastatin (LIPITOR) 10 MG tablet Take 10 mg by mouth at bedtime.    [provider]  cetirizine (ZYRTEC) 10 MG tablet Take 10 mg by mouth every morning.    [provider]  cholecalciferol (VITAMIN D) 25 MCG (1000 UNIT) tablet Take 2,000 Units by mouth every evening.    [provider]  ciprofloxacin (CIPRO) 500 MG tablet Take 1 tablet (500 mg total) by mouth 2 (two) times daily for 7 days. 09/15/22 09/22/22  [name], NP  ferrous sulfate 325 (65 FE) MG tablet Take 1 tablet (325 mg total) by mouth daily with breakfast. 02/11/22   Thurnell Lose, MD  folic acid (FOLVITE) 1 MG tablet Take 1 tablet (1 mg total) by mouth  daily. 02/12/22   Thurnell Lose, MD  hydrochlorothiazide (HYDRODIURIL) 25 MG tablet Take 25 mg by mouth in the morning.    [provider]  LORazepam (ATIVAN) 0.5 MG tablet Take 1 tablet (0.5 mg total) by mouth 2 (two) times daily as needed for up to 14 days for anxiety (agitation). 09/11/22 09/25/22  Fargo, Amy E, NP  losartan (COZAAR) 100 MG tablet Take 100 mg by mouth at bedtime.    [provider]  methocarbamol (ROBAXIN) 500 MG tablet Take 250 mg by mouth every 6 (six) hours as needed for muscle spasms.    [provider]  metoprolol succinate (TOPROL-XL) 50 MG 24 hr tablet Take 50 mg by mouth every evening. Take with or immediately following a meal.    [provider]  nitroGLYCERIN (NITROSTAT) 0.4 MG SL tablet Place 1 tablet (0.4 mg total) under the tongue every 5 (five) minutes as needed for chest pain. 09/23/20   Martinique, Peter M, MD  OLANZapine (ZYPREXA) 2.5 MG tablet Take 2.5 mg by mouth in the morning and at bedtime.    [provider]  oxyCODONE-acetaminophen (PERCOCET) 5-325 MG tablet Take 1 tablet by mouth in the morning and at bedtime for 14 days. 09/11/22 09/25/22  Fargo, Amy E, NP  oxyCODONE-acetaminophen (PERCOCET/ROXICET) 5-325 MG tablet Take 1 tablet by mouth 2 (two) times daily as needed for severe pain. 09/11/22 10/11/22  Fargo, Amy E, NP  pantoprazole (PROTONIX) 40 MG tablet Take 1 tablet (40 mg total) by mouth daily. 02/12/22   Thurnell Lose, MD  polyethylene glycol powder (GLYCOLAX/MIRALAX) 17 GM/SCOOP powder Take 17 g by mouth every other day. 09/11/22   Fargo, Amy E, NP  potassium chloride (KLOR-CON M) 10 MEQ tablet Take 10 mEq by mouth daily.    [provider]  senna (SENOKOT) 8.6 MG TABS tablet Take 1 tablet by mouth in the morning.    [provider]  sertraline (ZOLOFT) 25 MG tablet Take 50 mg by mouth at bedtime.    [provider]      Allergies    Latex, Nifedipine, Tetanus toxoids,  Tetanus-diphtheria toxoids td, and Adhesive [tape]    Review of Systems   Review of Systems  Constitutional:  Negative for fever.  HENT:  Negative for facial swelling.   Eyes:  Negative for redness.  Gastrointestinal:  Negative for abdominal pain.  Genitourinary:  Negative for bladder incontinence and dysuria.  Musculoskeletal:  Positive for back pain.  All other systems reviewed and are negative.   Physical Exam Updated Vital Signs BP 139/79   Pulse 70   Temp (!) 97.5 F (36.4 C) (Axillary)   Resp 18   Ht '5\' 4"'$  (1.626 m)   Wt 57.3 kg   SpO2 94%   BMI 21.68 kg/m  Physical Exam Vitals and nursing note reviewed.  Constitutional:      General: She is not in acute distress.    Appearance: Normal appearance. She is well-developed.  HENT:     Head: Normocephalic and atraumatic.     Nose: Nose normal.  Eyes:     Pupils: Pupils are equal, round, and reactive to light.  Cardiovascular:     Rate and Rhythm: Normal rate and regular rhythm.     Pulses: Normal pulses.     Heart sounds: Normal heart sounds.  Pulmonary:     Effort: Pulmonary effort is normal. No respiratory distress.     Breath sounds: Normal breath sounds.  Abdominal:     General: Bowel sounds are normal. There is no distension.     Palpations: Abdomen is soft.     Tenderness: There is no abdominal tenderness. There is no guarding or rebound.  Genitourinary:    Vagina: No vaginal discharge.  Musculoskeletal:        General: No tenderness.     Cervical back: Neck supple. No tenderness.     Thoracic back: No deformity or tenderness.     Lumbar back: No deformity or tenderness.  Skin:    General: Skin is dry.     Capillary Refill: Capillary refill takes less than 2 seconds.     Findings: No erythema or rash.  Neurological:     General: No focal deficit present.     Mental Status: She is alert.     Deep Tendon Reflexes: Reflexes normal.  Psychiatric:        Mood and Affect: Mood normal.    ED Results /  Procedures / Treatments   Labs (all labs ordered are listed, but only abnormal results are displayed) Labs Reviewed - No data to display  EKG None  Radiology No results found.  Procedures Procedures    Medications Ordered in ED Medications  ketorolac (TORADOL) injection 30 mg (30 mg Intramuscular Given 09/16/22 0413)    ED Course/ Medical Decision Making/ A&P  Medical Decision Making Has known rib fractures and compression fractures.  Is getting ativan and percocet for this at the nursing home also has right hip pain today.  No new trauma  Amount and/or Complexity of Data Reviewed Independent Historian: EMS    Details: See above  External Data Reviewed: notes.    Details: Previous notes  Radiology: ordered and independent interpretation performed.    Details: No hip fracture.  No new injuries to the ribs or chest by me.    Risk Prescription drug management. Risk Details: Patient on pain medication for rib fractures and compression fractures at the nursing home.  No pneumonia or effusion on CT scan.  Stable for discharge with close follow up.      Final Clinical Impression(s) / ED Diagnoses Final diagnoses:  Closed fracture of multiple ribs, unspecified laterality, sequela  Compression fracture of thoracic vertebra, unspecified thoracic vertebral level, sequela  Return for intractable cough, coughing up blood, fevers > 100.4 unrelieved by medication, shortness of breath, intractable vomiting, chest pain, shortness of breath, weakness, numbness, changes in speech, facial asymmetry, abdominal pain, passing out, Inability to tolerate liquids or food, cough, altered mental status or any concerns. No signs of systemic illness or infection. The patient is nontoxic-appearing on exam and vital signs are within normal limits.  I have reviewed the triage vital signs and the nursing notes. Pertinent labs & imaging results that were available during my care of  the patient were reviewed by me and considered in my medical decision making (see chart for details). After history, exam, and medical workup I feel the patient has been appropriately medically screened and is safe for discharge home. Pertinent diagnoses were discussed with the patient. Patient was given return precautions.   Rx / DC Orders ED Discharge Orders     None         Mariha Sleeper, MD 09/16/22 [phone]

## 2022-09-16 NOTE — ED Triage Notes (Addendum)
BIB [facility] from [facility] with c/o back pain. 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 Also dx with UTI - started on abx. Given percocet and ativan prior to EMS arrival.

## 2022-09-16 NOTE — ED Notes (Signed)
2 Ivs unsuccessful attempts

## 2022-09-18 ENCOUNTER — Encounter: Payer: Self-pay | Admitting: Orthopedic Surgery

## 2022-09-18 ENCOUNTER — Non-Acute Institutional Stay: Payer: Medicare Other | Admitting: Orthopedic Surgery

## 2022-09-18 DIAGNOSIS — G301 Alzheimer's disease with late onset: Secondary | ICD-10-CM | POA: Diagnosis not present

## 2022-09-18 DIAGNOSIS — N3000 Acute cystitis without hematuria: Secondary | ICD-10-CM | POA: Diagnosis not present

## 2022-09-18 DIAGNOSIS — I4891 Unspecified atrial fibrillation: Secondary | ICD-10-CM | POA: Diagnosis not present

## 2022-09-18 DIAGNOSIS — S22000A Wedge compression fracture of unspecified thoracic vertebra, initial encounter for closed fracture: Secondary | ICD-10-CM

## 2022-09-18 DIAGNOSIS — F02B18 Dementia in other diseases classified elsewhere, moderate, with other behavioral disturbance: Secondary | ICD-10-CM

## 2022-09-18 DIAGNOSIS — R41841 Cognitive communication deficit: Secondary | ICD-10-CM | POA: Diagnosis not present

## 2022-09-18 DIAGNOSIS — M6281 Muscle weakness (generalized): Secondary | ICD-10-CM | POA: Diagnosis not present

## 2022-09-18 DIAGNOSIS — S32010D Wedge compression fracture of first lumbar vertebra, subsequent encounter for fracture with routine healing: Secondary | ICD-10-CM

## 2022-09-18 DIAGNOSIS — R296 Repeated falls: Secondary | ICD-10-CM | POA: Diagnosis not present

## 2022-09-18 DIAGNOSIS — Z9181 History of falling: Secondary | ICD-10-CM | POA: Diagnosis not present

## 2022-09-18 DIAGNOSIS — M545 Low back pain, unspecified: Secondary | ICD-10-CM | POA: Diagnosis not present

## 2022-09-18 DIAGNOSIS — R4189 Other symptoms and signs involving cognitive functions and awareness: Secondary | ICD-10-CM | POA: Diagnosis not present

## 2022-09-18 NOTE — Progress Notes (Unsigned)
Location:  [place] Room Number: 18/A Place of Service:  ALF [phone]) Provider:  [name], NP   [name], MD  Patient Care Team: [name], MD as PCP - General (Internal Medicine) Martinique, Peter M, MD as PCP - Cardiology (Cardiology)  Extended Emergency Contact Information Primary Emergency Contact: [name],[name] Address: [address]          [place] [address] [name] of [place] Phone: [phone] Mobile Phone: [phone] Relation: Sister Secondary Emergency Contact: [name],[name]  [name] of [place] Phone: [phone] Mobile Phone: [phone] Relation: Niece  Code Status:  DNR Goals of care: Advanced Directive information    09/09/2022   10:23 PM  Advanced Directives  Does Patient Have a Medical Advance Directive? Yes  Type of Advance Directive Out of facility DNR (pink MOST or yellow form)  Does patient want to make changes to medical advance directive? No - Guardian declined  Pre-existing out of facility DNR order (yellow form or pink MOST form) Pink MOST/Yellow Form most recent copy in chart - Physician notified to receive inpatient order     Chief Complaint  Patient presents with   Acute Visit    Recent ED visit d/t back pain    HPI:  Pt is a 88 y.o. female seen today for acute visit due to ongoing back/rib pain.   09/20 she had a witnessed fall in shower. 09/13 xray spine revealed compression fracture of T6, T10 and L3. She had 2 visits to ED this past weekend due to increased back pain. 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  09/23 she presented to the ED due to increased back pain. Recently diagnosed with T3, T7, T11 compression fractures. She has been on Percocet scheduled and prn for pain. She is also given schedule tylenol and  lidocaine patches. CT chest confirmed compression fractures to T3, T7, T11 and L1. Xray right hip negative for pelvic or hip fracture, OA noted. She was given one time dose of Toradol and discharged back to [facility].  Today, she denies pain. She was given percocet this morning. HPOA has hired Actuary to be with her at times. MOST form discussed with HPOA. She would like to stop future hospitalizations at this time.   09/18 urine culture > 100,000 CFU/mL E. Coli. Started on Cipro x 7 days.    Past Medical History:  Diagnosis Date   Anxiety and depression    Aortic stenosis    a. 03/2014: Mild aortic stenosis by valve area and mean gradient though appeared more visually consistent with moderate AS.    CAD (coronary artery disease)    a. s/p prior PCI of D2 in 1992 b. stent to RCA in 2005   Diverticula of colon    GERD (gastroesophageal reflux disease)    HTN (hypertension)    Hypercholesteremia    Lateral myocardial infarction [facility]) 1992   Osteoarthritis    Osteopenia    Scoliosis    Past Surgical History:  Procedure Laterality Date   ABDOMINAL HYSTERECTOMY     APPENDECTOMY     CORONARY ANGIOPLASTY     TONSILLECTOMY      Allergies  Allergen Reactions   Latex Other (See Comments)    Unknown reaction - listed on [facility] 02/10/22   Nifedipine Other (See Comments)    Dark Urine; can only take  orange tablet, allergic to yellow-brown tablet   Tetanus Toxoids Other (See Comments)    Unknown reaction   Tetanus-Diphtheria Toxoids Td Other (See Comments)    Unknown reaction   Adhesive [Tape] Rash    Outpatient Encounter Medications as of 09/18/2022  Medication Sig   acetaminophen (TYLENOL) 500 MG tablet Take 500 mg by mouth in the morning, at noon, and at bedtime.   amLODipine (NORVASC) 5 MG tablet Take 5 mg by mouth daily.   atorvastatin (LIPITOR) 10 MG tablet Take 10 mg by mouth at bedtime.   cetirizine (ZYRTEC) 10 MG tablet Take 10 mg by mouth every morning.    cholecalciferol (VITAMIN D) 25 MCG (1000 UNIT) tablet Take 2,000 Units by mouth every evening.   ciprofloxacin (CIPRO) 500 MG tablet Take 1 tablet (500 mg total) by mouth 2 (two) times daily for 7 days.   ferrous sulfate 325 (65 FE) MG tablet Take 1 tablet (325 mg total) by mouth daily with breakfast.   folic acid (FOLVITE) 1 MG tablet Take 1 tablet (1 mg total) by mouth daily.   hydrochlorothiazide (HYDRODIURIL) 25 MG tablet Take 25 mg by mouth in the morning.   lidocaine (LIDODERM) 5 % Place 1 patch onto the skin daily. Remove & Discard patch within 12 hours or as directed by MD   LORazepam (ATIVAN) 0.5 MG tablet Take 1 tablet (0.5 mg total) by mouth 2 (two) times daily as needed for up to 14 days for anxiety (agitation).   losartan (COZAAR) 100 MG tablet Take 100 mg by mouth at bedtime.   methocarbamol (ROBAXIN) 500 MG tablet Take 250 mg by mouth every 6 (six) hours as needed for muscle spasms.   metoprolol succinate (TOPROL-XL) 50 MG 24 hr tablet Take 50 mg by mouth every evening. Take with or immediately following a meal.   nitroGLYCERIN (NITROSTAT) 0.4 MG SL tablet Place 1 tablet (0.4 mg total) under the tongue every 5 (five) minutes as needed for chest pain.   OLANZapine (ZYPREXA) 2.5 MG tablet Take 2.5 mg by mouth in the morning and at bedtime.   oxyCODONE-acetaminophen (PERCOCET) 5-325 MG tablet Take 1 tablet by mouth in the morning and at bedtime for 14 days.   oxyCODONE-acetaminophen (PERCOCET/ROXICET) 5-325 MG tablet Take 1 tablet by mouth 2 (two) times daily as needed for severe pain.   pantoprazole (PROTONIX) 40 MG tablet Take 1 tablet (40 mg total) by mouth daily.   polyethylene glycol powder (GLYCOLAX/MIRALAX) 17 GM/SCOOP powder Take 17 g by mouth every other day.   potassium chloride (KLOR-CON M) 10 MEQ tablet Take 10 mEq by mouth daily.   senna (SENOKOT) 8.6 MG TABS tablet Take 1 tablet by mouth in the morning.   sertraline (ZOLOFT) 25 MG tablet Take 50 mg by mouth at bedtime.    No facility-administered encounter medications on file as of 09/18/2022.    Review of Systems  Unable to perform ROS: Dementia    Immunization History  Administered Date(s) Administered   Influenza Split 05/13/2009, 05/18/2010, 09/29/2011, 09/05/2012, 09/03/2013   Influenza, High Dose Seasonal PF 09/09/2015, 08/30/2017   Influenza, Quadrivalent, Recombinant, Inj, Pf 08/19/2018, 09/29/2019   Influenza-Unspecified 10/18/2021   Moderna Covid-19 Vaccine Bivalent Booster 68yr & up 09/14/2021   Moderna SARS-COV2 Booster Vaccination 06/01/2021   Moderna Sars-Covid-2 Vaccination 12/29/2019, 01/26/2020   Pneumococcal Conjugate-13 07/08/2013   Pneumococcal Polysaccharide-23 06/29/2006, 05/13/2009, 05/18/2010, 05/25/2011   Td 05/13/2009, 05/18/2010, 05/25/2011   Zoster Recombinat (Shingrix) 08/24/2017, 10/31/2017   Zoster, Live 08/22/2008, 08/24/2008, 05/13/2009,  05/18/2010, 05/25/2011   Pertinent  Health Maintenance Due  Topic Date Due   INFLUENZA VACCINE  07/25/2022   DEXA SCAN  Completed      02/11/2022    8:00 AM 03/20/2022   10:22 AM 09/07/2022   12:11 PM 09/08/2022    3:52 AM 09/09/2022   10:24 PM  Fall Risk  Falls in the past year?  1 0    Was there an injury with Fall?  0 0    Fall Risk Category Calculator  1 0    Fall Risk Category  Low Low    Patient Fall Risk Level Moderate fall risk Moderate fall risk Low fall risk Moderate fall risk High fall risk  Patient at Risk for Falls Due to  History of fall(s);Impaired balance/gait;Mental status change;Other (Comment) No Fall Risks    Patient at Risk for Falls Due to - Comments  vascular dementia     Fall risk Follow up  Falls evaluation completed;Education provided;Falls prevention discussed Falls evaluation completed     Functional Status Survey:    Vitals:   09/18/22 1237  BP: 125/62  Pulse: 80  Resp: (!) 22  Temp: 98.4 F (36.9 C)  SpO2: 94%  Weight: 126 lb 6.4 oz (57.3 kg)  Height: 5' (1.524 m)   Body mass index  is 24.69 kg/m. Physical Exam Vitals reviewed.  Constitutional:      General: She is not in acute distress. HENT:     Head: Normocephalic.  Eyes:     General:        Right eye: No discharge.        Left eye: No discharge.  Cardiovascular:     Rate and Rhythm: Normal rate. Rhythm irregular.     Pulses: Normal pulses.     Heart sounds: Normal heart sounds.  Pulmonary:     Effort: Pulmonary effort is normal. No respiratory distress.     Breath sounds: Normal breath sounds. No wheezing.  Abdominal:     General: Bowel sounds are normal. There is no distension.     Palpations: Abdomen is soft.     Tenderness: There is no abdominal tenderness.  Musculoskeletal:     Cervical back: Normal and neck supple.     Thoracic back: Tenderness present. No swelling or deformity. Decreased range of motion.     Lumbar back: Tenderness present. No swelling. Decreased range of motion.     Right lower leg: No edema.     Left lower leg: No edema.  Skin:    General: Skin is warm and dry.     Capillary Refill: Capillary refill takes less than 2 seconds.  Neurological:     General: No focal deficit present.     Mental Status: She is alert. Mental status is at baseline.     Motor: Weakness present.     Gait: Gait abnormal.     Comments: Wheelchair/walker  Psychiatric:        Mood and Affect: Mood normal.        Behavior: Behavior normal.     Comments: Alert to self/person, follows commands     Labs reviewed: Recent Labs    02/11/22 0108 06/05/22 0000 09/08/22 0422 09/11/22 0730  NA 138 142 134* 135  K 3.0* 3.9 3.2* 3.4*  CL 106 107 101 96*  CO2 24 29* 25 29  GLUCOSE 110*  --  106* 67  BUN '11 16 10 16  '$ CREATININE 0.64 0.5 0.38* 0.62  CALCIUM 8.3*  8.7 8.9 9.0   Recent Labs    11/24/21 0000 02/09/22 0000 02/10/22 1303 09/11/22 0730  AST '16 23 28 18  '$ ALT '12 22 25 11  '$ ALKPHOS 106 90 97  --   BILITOT  --   --  0.5 0.6  PROT  --   --  6.3* 6.6  ALBUMIN 3.2* 3.7 3.2*  --    Recent  Labs    02/11/22 0108 02/20/22 0000 03/09/22 0000 06/05/22 0000 09/08/22 0422 09/11/22 0730  WBC 5.8   < > 3.7 4.2 6.7 5.5  NEUTROABS 4.1   < > 1,550.00  --  5.0 3,801  HGB 8.0*   < > 10.7* 12.5 14.1 14.5  HCT 26.6*   < > 36 39 43.2 42.1  MCV 80.4  --   --   --  95.6 89.6  PLT 216   < > 266 209 220 236   < > = values in this interval not displayed.   Lab Results  Component Value Date   TSH 2.90 11/24/2021   No results found for: "HGBA1C" Lab Results  Component Value Date   CHOL 86 01/23/2022   HDL 43 01/23/2022   LDLCALC 27 01/23/2022   TRIG 77 01/23/2022   CHOLHDL 1.9 09/17/2020    Significant Diagnostic Results in last 30 days:  CT Chest Wo Contrast  Result Date: 09/16/2022 CLINICAL DATA:  88 year old female with history of blunt trauma to the chest. Back pain. EXAM: CT CHEST WITHOUT CONTRAST TECHNIQUE: Multidetector CT imaging of the chest was performed following the standard protocol without IV contrast. RADIATION DOSE REDUCTION: This exam was performed according to the departmental dose-optimization program which includes automated exposure control, adjustment of the mA and/or kV according to patient size and/or use of iterative reconstruction technique. COMPARISON:  Chest CT 09/08/2022. FINDINGS: Cardiovascular: Heart size is normal. There is no significant pericardial fluid, thickening or pericardial calcification. There is aortic atherosclerosis, as well as atherosclerosis of the great vessels of the mediastinum and the coronary arteries, including calcified atherosclerotic plaque in the left main, left anterior descending, left circumflex and right coronary arteries. Severe calcifications of the aortic valve. Mediastinum/Nodes: No pathologically enlarged mediastinal or hilar lymph nodes. Please note that accurate exclusion of hilar adenopathy is limited on noncontrast CT scans. Large hiatal hernia. No axillary lymphadenopathy. Lungs/Pleura: No acute consolidative airspace  disease. No pleural effusions. Widespread ill-defined tiny 1-3 mm pulmonary nodules scattered throughout all aspects of the lungs bilaterally, predominantly centrilobular in distribution, likely areas of benign mucoid impaction within terminal bronchioles. No other larger more suspicious appearing pulmonary nodules or masses are noted. Upper Abdomen: Aortic atherosclerosis with aneurysmal dilatation of the infrarenal abdominal aorta which measures up to 3.1 cm in diameter. Musculoskeletal: Multiple vertebral body compression fractures are again noted at T3, T7, T11 and L1. These appear essentially unchanged, with the exception of T7 where there has been further compression, now with 90% loss of anterior vertebral body height and 60% loss of posterior vertebral body height. Sternotomy wires in the manubrium and upper sternum. Multiple old healed right-sided rib fractures involving the posterior aspect of the right eighth, ninth and tenth ribs with chronic nonunion of the ninth rib fracture, unchanged. Severe levoscoliosis of the lumbar spine. IMPRESSION: 1. Multiple chronic thoracic and lumbar vertebral body compression fractures, including one of which that appears progressive at T7, as above. 2. Multiple tiny benign-appearing 1-3 mm pulmonary nodules, similar to prior studies, likely benign areas of mucoid impaction. No follow-up  needed if patient is low-risk (and has no known or suspected primary neoplasm). Non-contrast chest CT can be considered in 12 months if patient is high-risk. This recommendation follows the consensus statement: Guidelines for Management of Incidental Pulmonary Nodules Detected on CT Images: From the Fleischner Society 2017; Radiology 2017; 284:228-243. 3. Aortic atherosclerosis, in addition to left main and three-vessel coronary artery disease. In addition, there is aneurysmal dilatation of the infrarenal abdominal aorta measuring 3.1 cm in diameter. Recommend follow-up ultrasound every 3  years. This recommendation follows ACR consensus guidelines: White Paper of the ACR Incidental Findings Committee II on Vascular Findings. J Am Coll Radiol 2013; 10:789-794. 4. There are calcifications of the aortic valve. Echocardiographic correlation for evaluation of potential valvular dysfunction may be warranted if clinically indicated. Electronically Signed   By: Vinnie Langton M.D.   On: 09/16/2022 06:25   DG Hip Unilat W or Wo Pelvis 2-3 Views Right  Result Date: 09/16/2022 CLINICAL DATA:  88 year old female with history of trauma from a fall complaining of right hip pain. EXAM: DG HIP (WITH OR WITHOUT PELVIS) 2-3V RIGHT COMPARISON:  No priors. FINDINGS: Three views of the bony pelvis and the right hip demonstrate no definite acute displaced fractures of the bony pelvic ring. Bilateral proximal femurs as visualized appear intact, and the right femoral head is properly located. Mild joint space narrowing, subchondral sclerosis, subchondral cyst formation and osteophyte formation is noted in the hip joints bilaterally, indicative of osteoarthritis. IMPRESSION: 1. No acute radiographic abnormality of the bony pelvis or the right hip. 2. Mild bilateral hip joint osteoarthritis. Electronically Signed   By: Vinnie Langton M.D.   On: 09/16/2022 05:25   DG Chest 1 View  Result Date: 09/16/2022 CLINICAL DATA:  88 year old female history of back pain. EXAM: CHEST  1 VIEW COMPARISON:  Chest x-ray 09/09/2022. FINDINGS: Lung volumes are low. Opacity at the left base partially obscuring the left hemidiaphragm, favored to reflect areas of atelectasis or scarring, although underlying airspace consolidation is not excluded. Mild blunting of left costophrenic sulcus could suggest a small left pleural effusion. Right lung is clear. No right pleural effusion. No pneumothorax. No evidence of pulmonary edema. Heart size is normal. Upper mediastinal contours are within normal limits. Atherosclerotic calcifications are  noted in the thoracic aorta. Sternotomy wires in the upper sternum. No definite acute displaced fractures are identified in the visualized portions of the thorax. IMPRESSION: 1. Low lung volumes with atelectasis and/or consolidation in the left lung base and probable small left pleural effusion. 2. Aortic atherosclerosis. Electronically Signed   By: Vinnie Langton M.D.   On: 09/16/2022 05:23   DG Ribs Unilateral W/Chest Right  Result Date: 09/10/2022 CLINICAL DATA:  Trauma with rib pain EXAM: RIGHT RIBS AND CHEST - 3+ VIEW COMPARISON:  None Available. FINDINGS: No fracture or other bone lesions are seen involving the ribs. There is no evidence of pneumothorax or pleural effusion. Both lungs are clear. Heart size and mediastinal contours are within normal limits. S shaped scoliosis. IMPRESSION: No rib fracture. Clear lungs. Electronically Signed   By: Ulyses Jarred M.D.   On: 09/10/2022 00:13   CT CHEST ABDOMEN PELVIS W CONTRAST  Result Date: 09/08/2022 CLINICAL DATA:  Left rib pain after unwitnessed fall 2 days ago. Back pain. EXAM: CT CHEST, ABDOMEN, AND PELVIS WITH CONTRAST TECHNIQUE: Multidetector CT imaging of the chest, abdomen and pelvis was performed following the standard protocol during bolus administration of intravenous contrast. RADIATION DOSE REDUCTION: This exam was performed  according to the departmental dose-optimization program which includes automated exposure control, adjustment of the mA and/or kV according to patient size and/or use of iterative reconstruction technique. CONTRAST:  29m OMNIPAQUE IOHEXOL 300 MG/ML  SOLN COMPARISON:  Chest CT 05/02/2021 FINDINGS: CT CHEST FINDINGS Cardiovascular: Heart size upper normal. No substantial pericardial effusion. Coronary artery calcification is evident. Aortic valve calcification evident. Moderate atherosclerotic calcification is noted in the wall of the thoracic aorta. No large central pulmonary embolus. Mediastinum/Nodes: No mediastinal  lymphadenopathy. There is no hilar lymphadenopathy. Moderate to large hiatal hernia noted. There is no axillary lymphadenopathy. Lungs/Pleura: Scattered areas of peripheral clustered tree-in-bud micro nodularity identified in both lungs, suggesting sequelae of atypical infection. Left lower lobe compressive atelectasis noted adjacent to the hiatal hernia. No overtly suspicious pulmonary nodule or mass. No focal airspace consolidation. There is no evidence of pleural effusion. Musculoskeletal: Nonacute rib fractures are identified posteriorly. Nonacute fracture nonunion identified posterior right ninth rib. Thoracic spine compression fractures are noted at T3, T7, and T11. CT ABDOMEN PELVIS FINDINGS Hepatobiliary: No suspicious focal abnormality within the liver parenchyma. There is no evidence for gallstones, gallbladder wall thickening, or pericholecystic fluid. No intrahepatic or extrahepatic biliary dilation. Pancreas: No focal mass lesion. No dilatation of the main duct. No intraparenchymal cyst. No peripancreatic edema. Spleen: No splenomegaly. No focal mass lesion. Adrenals/Urinary Tract: No adrenal nodule or mass. Kidneys unremarkable. No evidence for hydroureter. The urinary bladder appears normal for the degree of distention. Stomach/Bowel: Large hiatal hernia contains 50-75% of the stomach. Duodenum is normally positioned as is the ligament of Treitz. No small bowel wall thickening. No small bowel dilatation. The terminal ileum is normal. The appendix is not well visualized, but there is no edema or inflammation in the region of the cecum. No gross colonic mass. No colonic wall thickening. Diverticuli are seen scattered along the entire length of the colon without CT findings of diverticulitis. Diverticular disease is most advanced in the sigmoid segment. Vascular/Lymphatic: There is advanced atherosclerotic calcification of the abdominal aorta. Fusiform dilatation of the infrarenal abdominal aorta up to  3.0 cm diameter evident. There is no gastrohepatic or hepatoduodenal ligament lymphadenopathy. No retroperitoneal or mesenteric lymphadenopathy. No pelvic sidewall lymphadenopathy. Reproductive: The uterus is surgically absent. There is no adnexal mass. Other: No intraperitoneal free fluid. Musculoskeletal: No worrisome lytic or sclerotic osseous abnormality. Thoracolumbar scoliosis evident. IMPRESSION: 1. No acute findings in the chest, abdomen, or pelvis. 2. Scattered areas of peripheral clustered tree-in-bud micro nodularity in both lungs, suggesting sequelae of atypical infection. Findings are improved compared to chest CT 05/02/2021. 3. Moderate to large hiatal hernia contains 50-75% of the stomach. 4. 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 5. Thoracic spine compression fractures at T3, T7, and T11. 6. Fusiform dilatation of the infrarenal abdominal aorta up to 3.0 cm diameter. Recommend follow-up ultrasound every 3 years. This recommendation follows ACR consensus guidelines: White Paper of the ACR Incidental Findings Committee II on Vascular Findings. J Am Coll Radiol 2013; 10:789-794. 7. Colonic diverticulosis without diverticulitis. 8. Aortic Atherosclerosis (ICD10-I70.0). Electronically Signed   By: EMisty StanleyM.D.   On: 09/08/2022 06:45   CT CERVICAL SPINE WO CONTRAST  Result Date: 09/08/2022 CLINICAL DATA:  88year old female status post unwitnessed fall 2 days ago. Rib pain. EXAM: CT CERVICAL SPINE WITHOUT CONTRAST TECHNIQUE: Multidetector CT imaging of the cervical spine was performed without intravenous contrast. Multiplanar CT image reconstructions were also generated. RADIATION DOSE REDUCTION: This exam was performed according  to the departmental dose-optimization program which includes automated exposure control, adjustment of the mA and/or kV according to patient size and/or use of iterative reconstruction technique. COMPARISON:  Cervical  spine CT 12/15/2020. FINDINGS: Alignment: Mildly exaggerated cervical lordosis has not significantly changed since 2021, with mild chronic anterolisthesis at both C2-C3 and C6-C7. Subtle chronic anterolisthesis at C7-T1. Bilateral posterior element alignment is within normal limits. Skull base and vertebrae: Osteopenia. Visualized skull base is intact. No atlanto-occipital dissociation. C1 and C2 appear stable. Stable chronic C6 mild vertebral loss of height. No acute osseous abnormality identified. Soft tissues and spinal canal: No prevertebral fluid or swelling. No visible canal hematoma. Stable visible noncontrast neck soft tissues, calcified carotid atherosclerosis. Evidence of chronic left vertebral artery tortuosity. Disc levels: Advanced chronic cervical spine degeneration appears stable since 2021. Mild multifactorial spinal stenosis suspected at C3-C4. Upper chest: Chest CT today is reported separately. IMPRESSION: 1. No acute traumatic injury identified in the cervical spine. 2. Osteopenia and advanced chronic cervical spine degeneration appear stable since 2021. 3. Chest CT today is reported separately. Electronically Signed   By: Genevie Ann M.D.   On: 09/08/2022 06:44   CT HEAD WO CONTRAST (5MM)  Result Date: 09/08/2022 CLINICAL DATA:  88 year old female status post unwitnessed fall 2 days ago. Rib pain. EXAM: CT HEAD WITHOUT CONTRAST TECHNIQUE: Contiguous axial images were obtained from the base of the skull through the vertex without intravenous contrast. RADIATION DOSE REDUCTION: This exam was performed according to the departmental dose-optimization program which includes automated exposure control, adjustment of the mA and/or kV according to patient size and/or use of iterative reconstruction technique. COMPARISON:  Head CT 05/02/2021. FINDINGS: Brain: Stable cerebral volume. No midline shift, ventriculomegaly, intracranial hemorrhage or evidence of cortically based acute infarction. Patchy mild to  moderate for age bilateral cerebral white matter hypodensity appears stable from last year. Subtle chronic rounded and mildly hyperdense right posterior para falcine meningioma is up to 22 mm long axis (coronal image 48) but not significantly changed since 2021. No associated cerebral edema, and no significant regional mass effect. Vascular: Calcified atherosclerosis at the skull base. No suspicious intracranial vascular hyperdensity. Dominant appearing distal left vertebral artery. Skull: Stable.  No acute osseous abnormality identified. Sinuses/Orbits: Visualized paranasal sinuses and mastoids are stable and well aerated. Other: No acute orbit or scalp soft tissue finding. There is some chronic scalp vessel calcified atherosclerosis. IMPRESSION: 1. No acute intracranial abnormality or acute traumatic injury identified. 2. 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 No cerebral edema, suggesting this is clinically silent. 3. Stable mild to moderate for age cerebral white matter changes, most commonly due to chronic small vessel disease. Electronically Signed   By: Genevie Ann M.D.   On: 09/08/2022 06:40   DG Ribs Bilateral W/Chest  Result Date: 09/08/2022 CLINICAL DATA:  88 year old female status post unwitnessed fall. Upper back pain. EXAM: BILATERAL RIBS AND CHEST - 4+ VIEW COMPARISON:  Rib radiographs 12/14/2021.  Chest CT 05/02/2021. FINDINGS: Semi upright AP view of the chest at 0435 hours. Stable cardiomegaly and mediastinal contours. Chronic sternotomy. Moderate to large chronic gastric hiatal hernia better demonstrated by CT. Pronounced Calcified aortic atherosclerosis. Lower lung volumes compared to last year. Vague increased bibasilar opacity with no air bronchograms. No pneumothorax. Pulmonary vascularity appears stable. Osteopenia. Multiple chronic left lateral rib fractures, demonstrated by CT last year. No acute displaced rib  fracture identified. Other visible osseous structures appear  grossly intact, chronic thoracolumbar scoliosis. Paucity of bowel gas in the upper abdomen. IMPRESSION: 1. No acute displaced rib fracture identified. Multiple previous left lateral rib fractures as seen by CT last year. 2. Chronic cardiomegaly, hiatal hernia, Aortic Atherosclerosis (ICD10-I70.0). 3. Lower lung volumes with vague bibasilar opacity, favor atelectasis. Electronically Signed   By: Genevie Ann M.D.   On: 09/08/2022 05:11    Assessment/Plan 1. Compression fracture of body of thoracic vertebra ([place]) - 3 ED visits in past week  - 09/15 CT chest noted fracture to T3, T7, T11 - cont Percocet 5/325 mg BID - cont Percocet 5/325 mg BID PRN - cont lidocaine patches - cont scheduled tylenol   2. Compression fracture of L1 vertebra with routine healing, subsequent encounter - see above  3. Moderate late onset Alzheimer's dementia with other behavioral disturbance ([place]) - MOST form updated- no future hospitalizations  - periods of increased anxiety - Zyprexa increased to BID - zoloft increased to 50 mg  - cont ativan prn - cont sitter   4. Acute cystitis without hematuria - 09/18 urine culture > 100,000 CFU/mL E. Coli - cont Cipro x 7 days - encourage fluids  5. Frequent falls - cont PT/OT    Family/ staff Communication: plan discussed with patient and nurse  Labs/tests ordered:  none

## 2022-09-19 DIAGNOSIS — M6281 Muscle weakness (generalized): Secondary | ICD-10-CM | POA: Diagnosis not present

## 2022-09-19 DIAGNOSIS — R4189 Other symptoms and signs involving cognitive functions and awareness: Secondary | ICD-10-CM | POA: Diagnosis not present

## 2022-09-19 DIAGNOSIS — M545 Low back pain, unspecified: Secondary | ICD-10-CM | POA: Diagnosis not present

## 2022-09-19 DIAGNOSIS — I4891 Unspecified atrial fibrillation: Secondary | ICD-10-CM | POA: Diagnosis not present

## 2022-09-19 DIAGNOSIS — R41841 Cognitive communication deficit: Secondary | ICD-10-CM | POA: Diagnosis not present

## 2022-09-19 DIAGNOSIS — Z9181 History of falling: Secondary | ICD-10-CM | POA: Diagnosis not present

## 2022-09-19 MED ORDER — LIDOCAINE 5 % EX PTCH: 2.0000 | MEDICATED_PATCH | CUTANEOUS | 0 refills | Status: DC

## 2022-09-20 DIAGNOSIS — Z9181 History of falling: Secondary | ICD-10-CM | POA: Diagnosis not present

## 2022-09-20 DIAGNOSIS — M6281 Muscle weakness (generalized): Secondary | ICD-10-CM | POA: Diagnosis not present

## 2022-09-20 DIAGNOSIS — R41841 Cognitive communication deficit: Secondary | ICD-10-CM | POA: Diagnosis not present

## 2022-09-20 DIAGNOSIS — I4891 Unspecified atrial fibrillation: Secondary | ICD-10-CM | POA: Diagnosis not present

## 2022-09-20 DIAGNOSIS — R4189 Other symptoms and signs involving cognitive functions and awareness: Secondary | ICD-10-CM | POA: Diagnosis not present

## 2022-09-20 DIAGNOSIS — M545 Low back pain, unspecified: Secondary | ICD-10-CM | POA: Diagnosis not present

## 2022-09-21 ENCOUNTER — Encounter: Payer: Self-pay | Admitting: Internal Medicine

## 2022-09-21 ENCOUNTER — Non-Acute Institutional Stay: Payer: Medicare Other | Admitting: Internal Medicine

## 2022-09-21 DIAGNOSIS — F32A Depression, unspecified: Secondary | ICD-10-CM | POA: Diagnosis not present

## 2022-09-21 DIAGNOSIS — M6281 Muscle weakness (generalized): Secondary | ICD-10-CM | POA: Diagnosis not present

## 2022-09-21 DIAGNOSIS — S32010D Wedge compression fracture of first lumbar vertebra, subsequent encounter for fracture with routine healing: Secondary | ICD-10-CM | POA: Diagnosis not present

## 2022-09-21 DIAGNOSIS — F419 Anxiety disorder, unspecified: Secondary | ICD-10-CM

## 2022-09-21 DIAGNOSIS — G301 Alzheimer's disease with late onset: Secondary | ICD-10-CM

## 2022-09-21 DIAGNOSIS — R41841 Cognitive communication deficit: Secondary | ICD-10-CM | POA: Diagnosis not present

## 2022-09-21 DIAGNOSIS — R4189 Other symptoms and signs involving cognitive functions and awareness: Secondary | ICD-10-CM | POA: Diagnosis not present

## 2022-09-21 DIAGNOSIS — R296 Repeated falls: Secondary | ICD-10-CM | POA: Diagnosis not present

## 2022-09-21 DIAGNOSIS — F02B18 Dementia in other diseases classified elsewhere, moderate, with other behavioral disturbance: Secondary | ICD-10-CM

## 2022-09-21 DIAGNOSIS — I4891 Unspecified atrial fibrillation: Secondary | ICD-10-CM | POA: Diagnosis not present

## 2022-09-21 DIAGNOSIS — S22000A Wedge compression fracture of unspecified thoracic vertebra, initial encounter for closed fracture: Secondary | ICD-10-CM | POA: Diagnosis not present

## 2022-09-21 DIAGNOSIS — M545 Low back pain, unspecified: Secondary | ICD-10-CM | POA: Diagnosis not present

## 2022-09-21 DIAGNOSIS — F02B11 Dementia in other diseases classified elsewhere, moderate, with agitation: Secondary | ICD-10-CM | POA: Diagnosis not present

## 2022-09-21 DIAGNOSIS — Z9181 History of falling: Secondary | ICD-10-CM | POA: Diagnosis not present

## 2022-09-21 NOTE — Progress Notes (Signed)
Location: [facility] Room Number: [place] of Service:  ALF ([id number])  Provider:   Code Status: DNR Goals of Care:     09/21/2022   12:50 PM  Advanced Directives  Does Patient Have a Medical Advance Directive? Yes  Type of Paramedic of [place];Living will;Out of facility DNR (pink MOST or yellow form)  Does patient want to make changes to medical advance directive? No - Patient declined  Copy of [place] in Chart? Yes - validated most recent copy scanned in chart (See row information)  Pre-existing out of facility DNR order (yellow form or pink MOST form) Pink Most/Yellow Form available - Physician notified to receive inpatient order     Chief Complaint  Patient presents with   Acute Visit    Falls and behaviors    HPI: Patient is a 88 y.o. female seen today for an acute visit for Falls and Behaviors  [place] Patient was coming out of the shower when she fell backwards.  Since then patient has been having pain in her back and chest She had x-rays done here Cervical x-rays were negative for any fracture she had a compression fracture of T6 and T10 Also had a compression fracture of L3 All these are indeterminate age  Since then she has been  getting very anxious Especially in the evening.  She Screams that there is nobody helping her.   Also continues to have falls.  Patient fell this morning when she was trying to get up from her bed to get on her wheelchair. She is needing all ADL help. Patient has had consider for the last 2 days but that is not helping. Nurses want her to be transferred to higher level of care Past Medical History:  Diagnosis Date   Anxiety and depression    Aortic stenosis    a. 03/2014: Mild aortic stenosis by valve area and mean gradient though appeared more visually consistent with moderate AS.    CAD (coronary artery disease)    a. s/p prior PCI of D2 in 1992 b.  stent to RCA in 2005   Diverticula of colon    GERD (gastroesophageal reflux disease)    HTN (hypertension)    Hypercholesteremia    Lateral myocardial infarction ([place]) 1992   Osteoarthritis    Osteopenia    Scoliosis     Past Surgical History:  Procedure Laterality Date   ABDOMINAL HYSTERECTOMY     APPENDECTOMY     CORONARY ANGIOPLASTY     TONSILLECTOMY      Allergies  Allergen Reactions   Latex Other (See Comments)    Unknown reaction - listed on [facility] 02/10/22   Nifedipine Other (See Comments)    Dark Urine; can only take orange tablet, allergic to yellow-brown tablet   Tetanus Toxoids Other (See Comments)    Unknown reaction   Tetanus-Diphtheria Toxoids Td Other (See Comments)    Unknown reaction   Adhesive [Tape] Rash    Outpatient Encounter Medications as of 09/21/2022  Medication Sig   [EXPIRED] ciprofloxacin (CIPRO) 500 MG tablet Take 500 mg by mouth 2 (two) times daily.   divalproex (DEPAKOTE SPRINKLE) 125 MG capsule Take 125 mg by mouth at bedtime.   ferrous sulfate 325 (65 FE) MG tablet Take 325 mg by mouth daily with breakfast.   folic acid (FOLVITE) 1 MG tablet Take 1 mg by mouth daily.   LORazepam (ATIVAN) 0.5 MG tablet Take  0.5 mg by mouth every 8 (eight) hours.   nitroGLYCERIN (NITROSTAT) 0.4 MG SL tablet Place 0.4 mg under the tongue every 5 (five) minutes as needed for chest pain. As needed fo chest pain   oxyCODONE-acetaminophen (PERCOCET) 5-325 MG tablet Take 1 tablet by mouth every 4 (four) hours as needed for severe pain.   oxyCODONE-acetaminophen (PERCOCET/ROXICET) 5-325 MG tablet Take 1 tablet by mouth every 4 (four) hours as needed for severe pain.   oxyCODONE-acetaminophen (PERCOCET/ROXICET) 5-325 MG tablet Take 1 tablet by mouth every 4 (four) hours as needed for severe pain.   pantoprazole (PROTONIX) 40 MG tablet Take 40 mg by mouth daily.   polyethylene glycol powder (MIRALAX) 17 GM/SCOOP powder Take 1 Container by mouth once.   acetaminophen  (TYLENOL) 500 MG tablet Take 500 mg by mouth in the morning, at noon, and at bedtime.   amLODipine (NORVASC) 5 MG tablet Take 5 mg by mouth daily.   atorvastatin (LIPITOR) 10 MG tablet Take 10 mg by mouth at bedtime.   cetirizine (ZYRTEC) 10 MG tablet Take 10 mg by mouth every morning.   cholecalciferol (VITAMIN D) 25 MCG (1000 UNIT) tablet Take 2,000 Units by mouth every evening.   hydrochlorothiazide (HYDRODIURIL) 25 MG tablet Take 25 mg by mouth in the morning.   losartan (COZAAR) 100 MG tablet Take 100 mg by mouth at bedtime.   methocarbamol (ROBAXIN) 500 MG tablet Take 250 mg by mouth every 6 (six) hours as needed for muscle spasms.   metoprolol succinate (TOPROL-XL) 50 MG 24 hr tablet Take 50 mg by mouth every evening. Take with or immediately following a meal.   OLANZapine (ZYPREXA) 2.5 MG tablet Take 5 mg by mouth in the morning and at bedtime.   potassium chloride (KLOR-CON M) 10 MEQ tablet Take 10 mEq by mouth daily.   senna (SENOKOT) 8.6 MG TABS tablet Take 1 tablet by mouth in the morning.   sertraline (ZOLOFT) 25 MG tablet Take 50 mg by mouth at bedtime.   [DISCONTINUED] ciprofloxacin (CIPRO) 500 MG tablet Take 1 tablet (500 mg total) by mouth 2 (two) times daily for 7 days. (Patient taking differently: Take 500 mg by mouth 2 (two) times daily. Start date: 09/15/22 End date: 09/21/22)   [DISCONTINUED] ferrous sulfate 325 (65 FE) MG tablet Take 1 tablet (325 mg total) by mouth daily with breakfast.   [DISCONTINUED] folic acid (FOLVITE) 1 MG tablet Take 1 tablet (1 mg total) by mouth daily.   [DISCONTINUED] lidocaine (LIDODERM) 5 % Place 2 patches onto the skin daily. Remove & Discard patch within 12 hours or as directed by MD   [DISCONTINUED] LORazepam (ATIVAN) 0.5 MG tablet Take 1 tablet (0.5 mg total) by mouth 2 (two) times daily as needed for up to 14 days for anxiety (agitation).   [DISCONTINUED] nitroGLYCERIN (NITROSTAT) 0.4 MG SL tablet Place 1 tablet (0.4 mg total) under the tongue  every 5 (five) minutes as needed for chest pain.   [DISCONTINUED] oxyCODONE-acetaminophen (PERCOCET) 5-325 MG tablet Take 1 tablet by mouth in the morning and at bedtime for 14 days.   [DISCONTINUED] oxyCODONE-acetaminophen (PERCOCET/ROXICET) 5-325 MG tablet Take 1 tablet by mouth 2 (two) times daily as needed for severe pain.   [DISCONTINUED] pantoprazole (PROTONIX) 40 MG tablet Take 1 tablet (40 mg total) by mouth daily.   [DISCONTINUED] polyethylene glycol powder (GLYCOLAX/MIRALAX) 17 GM/SCOOP powder Take 17 g by mouth every other day.   No facility-administered encounter medications on file as of 09/21/2022.  Review of Systems:  Review of Systems  Constitutional:  Positive for activity change. Negative for appetite change.  HENT: Negative.    Respiratory:  Negative for cough and shortness of breath.   Cardiovascular:  Negative for leg swelling.  Gastrointestinal:  Negative for constipation.  Genitourinary: Negative.   Musculoskeletal:  Positive for back pain and gait problem. Negative for arthralgias and myalgias.  Skin: Negative.   Neurological:  Negative for dizziness and weakness.  Psychiatric/Behavioral:  Positive for agitation, behavioral problems, confusion, dysphoric mood and sleep disturbance. The patient is nervous/anxious.     Health Maintenance  Topic Date Due   INFLUENZA VACCINE  07/25/2022   TETANUS/TDAP  03/20/2032   Pneumonia Vaccine 68+ Years old  Completed   DEXA SCAN  Completed   COVID-19 Vaccine  Completed   Zoster Vaccines- Shingrix  Completed   HPV VACCINES  Aged Out    Physical Exam: Vitals:   09/22/22 0743  BP: 112/65  Pulse: 79  Resp: 14  Temp: 98.7 F (37.1 C)   There is no height or weight on file to calculate BMI. Physical Exam Vitals reviewed.  Constitutional:      Appearance: Normal appearance.  HENT:     Head: Normocephalic.     Nose: Nose normal.     Mouth/Throat:     Mouth: Mucous membranes are moist.     Pharynx: Oropharynx is  clear.  Eyes:     Pupils: Pupils are equal, round, and reactive to light.  Cardiovascular:     Rate and Rhythm: Normal rate and regular rhythm.     Pulses: Normal pulses.     Heart sounds: Normal heart sounds. No murmur heard. Pulmonary:     Effort: Pulmonary effort is normal.     Breath sounds: Normal breath sounds.  Abdominal:     General: Abdomen is flat. Bowel sounds are normal.     Palpations: Abdomen is soft.  Musculoskeletal:        General: No swelling.     Cervical back: Neck supple.  Skin:    General: Skin is warm.  Neurological:     General: No focal deficit present.     Mental Status: She is alert.  Psychiatric:        Mood and Affect: Mood normal.        Thought Content: Thought content normal.     Labs reviewed: Basic Metabolic Panel: Recent Labs    11/24/21 0000 02/09/22 0000 02/11/22 0108 06/05/22 0000 09/08/22 0422 09/11/22 0730  NA 141   < > 138 142 134* 135  K 4.4   < > 3.0* 3.9 3.2* 3.4*  CL 106   < > 106 107 101 96*  CO2 27*   < > 24 29* 25 29  GLUCOSE  --    < > 110*  --  106* 67  BUN 12   < > '11 16 10 16  '$ CREATININE 0.4*   < > 0.64 0.5 0.38* 0.62  CALCIUM 8.3*   < > 8.3* 8.7 8.9 9.0  TSH 2.90  --   --   --   --   --    < > = values in this interval not displayed.   Liver Function Tests: Recent Labs    11/24/21 0000 02/09/22 0000 02/10/22 1303 09/11/22 0730  AST '16 23 28 18  '$ ALT '12 22 25 11  '$ ALKPHOS 106 90 97  --   BILITOT  --   --  0.5  0.6  PROT  --   --  6.3* 6.6  ALBUMIN 3.2* 3.7 3.2*  --    No results for input(s): "LIPASE", "AMYLASE" in the last 8760 hours. No results for input(s): "AMMONIA" in the last 8760 hours. CBC: Recent Labs    02/11/22 0108 02/20/22 0000 03/09/22 0000 06/05/22 0000 09/08/22 0422 09/11/22 0730  WBC 5.8   < > 3.7 4.2 6.7 5.5  NEUTROABS 4.1   < > 1,550.00  --  5.0 3,801  HGB 8.0*   < > 10.7* 12.5 14.1 14.5  HCT 26.6*   < > 36 39 43.2 42.1  MCV 80.4  --   --   --  95.6 89.6  PLT 216   < > 266  209 220 236   < > = values in this interval not displayed.   Lipid Panel: Recent Labs    01/23/22 0000  CHOL 86  HDL 43  LDLCALC 27  TRIG 77   No results found for: "HGBA1C"  Procedures since last visit: CT Chest Wo Contrast  Result Date: 09/16/2022 CLINICAL DATA:  88 year old female with history of blunt trauma to the chest. Back pain. EXAM: CT CHEST WITHOUT CONTRAST TECHNIQUE: Multidetector CT imaging of the chest was performed following the standard protocol without IV contrast. RADIATION DOSE REDUCTION: This exam was performed according to the departmental dose-optimization program which includes automated exposure control, adjustment of the mA and/or kV according to patient size and/or use of iterative reconstruction technique. COMPARISON:  Chest CT 09/08/2022. FINDINGS: Cardiovascular: Heart size is normal. There is no significant pericardial fluid, thickening or pericardial calcification. There is aortic atherosclerosis, as well as atherosclerosis of the great vessels of the mediastinum and the coronary arteries, including calcified atherosclerotic plaque in the left main, left anterior descending, left circumflex and right coronary arteries. Severe calcifications of the aortic valve. Mediastinum/Nodes: No pathologically enlarged mediastinal or hilar lymph nodes. Please note that accurate exclusion of hilar adenopathy is limited on noncontrast CT scans. Large hiatal hernia. No axillary lymphadenopathy. Lungs/Pleura: No acute consolidative airspace disease. No pleural effusions. Widespread ill-defined tiny 1-3 mm pulmonary nodules scattered throughout all aspects of the lungs bilaterally, predominantly centrilobular in distribution, likely areas of benign mucoid impaction within terminal bronchioles. No other larger more suspicious appearing pulmonary nodules or masses are noted. Upper Abdomen: Aortic atherosclerosis with aneurysmal dilatation of the infrarenal abdominal aorta which measures  up to 3.1 cm in diameter. Musculoskeletal: Multiple vertebral body compression fractures are again noted at T3, T7, T11 and L1. These appear essentially unchanged, with the exception of T7 where there has been further compression, now with 90% loss of anterior vertebral body height and 60% loss of posterior vertebral body height. Sternotomy wires in the manubrium and upper sternum. Multiple old healed right-sided rib fractures involving the posterior aspect of the right eighth, ninth and tenth ribs with chronic nonunion of the ninth rib fracture, unchanged. Severe levoscoliosis of the lumbar spine. IMPRESSION: 1. Multiple chronic thoracic and lumbar vertebral body compression fractures, including one of which that appears progressive at T7, as above. 2. Multiple tiny benign-appearing 1-3 mm pulmonary nodules, similar to prior studies, likely benign areas of mucoid impaction. No follow-up needed if patient is low-risk (and has no known or suspected primary neoplasm). Non-contrast chest CT can be considered in 12 months if patient is high-risk. This recommendation follows the consensus statement: Guidelines for Management of Incidental Pulmonary Nodules Detected on CT Images: From the Fleischner Society 2017; Radiology 2017; 284:228-243. 3.  Aortic atherosclerosis, in addition to left main and three-vessel coronary artery disease. In addition, there is aneurysmal dilatation of the infrarenal abdominal aorta measuring 3.1 cm in diameter. Recommend follow-up ultrasound every 3 years. This recommendation follows ACR consensus guidelines: White Paper of the ACR Incidental Findings Committee II on Vascular Findings. J Am Coll Radiol 2013; 10:789-794. 4. There are calcifications of the aortic valve. Echocardiographic correlation for evaluation of potential valvular dysfunction may be warranted if clinically indicated. Electronically Signed   By: Vinnie Langton M.D.   On: 09/16/2022 06:25   DG Hip Unilat W or Wo Pelvis  2-3 Views Right  Result Date: 09/16/2022 CLINICAL DATA:  88 year old female with history of trauma from a fall complaining of right hip pain. EXAM: DG HIP (WITH OR WITHOUT PELVIS) 2-3V RIGHT COMPARISON:  No priors. FINDINGS: Three views of the bony pelvis and the right hip demonstrate no definite acute displaced fractures of the bony pelvic ring. Bilateral proximal femurs as visualized appear intact, and the right femoral head is properly located. Mild joint space narrowing, subchondral sclerosis, subchondral cyst formation and osteophyte formation is noted in the hip joints bilaterally, indicative of osteoarthritis. IMPRESSION: 1. No acute radiographic abnormality of the bony pelvis or the right hip. 2. Mild bilateral hip joint osteoarthritis. Electronically Signed   By: Vinnie Langton M.D.   On: 09/16/2022 05:25   DG Chest 1 View  Result Date: 09/16/2022 CLINICAL DATA:  88 year old female history of back pain. EXAM: CHEST  1 VIEW COMPARISON:  Chest x-ray 09/09/2022. FINDINGS: Lung volumes are low. Opacity at the left base partially obscuring the left hemidiaphragm, favored to reflect areas of atelectasis or scarring, although underlying airspace consolidation is not excluded. Mild blunting of left costophrenic sulcus could suggest a small left pleural effusion. Right lung is clear. No right pleural effusion. No pneumothorax. No evidence of pulmonary edema. Heart size is normal. Upper mediastinal contours are within normal limits. Atherosclerotic calcifications are noted in the thoracic aorta. Sternotomy wires in the upper sternum. No definite acute displaced fractures are identified in the visualized portions of the thorax. IMPRESSION: 1. Low lung volumes with atelectasis and/or consolidation in the left lung base and probable small left pleural effusion. 2. Aortic atherosclerosis. Electronically Signed   By: Vinnie Langton M.D.   On: 09/16/2022 05:23   DG Ribs Unilateral W/Chest Right  Result Date:  09/10/2022 CLINICAL DATA:  Trauma with rib pain EXAM: RIGHT RIBS AND CHEST - 3+ VIEW COMPARISON:  None Available. FINDINGS: No fracture or other bone lesions are seen involving the ribs. There is no evidence of pneumothorax or pleural effusion. Both lungs are clear. Heart size and mediastinal contours are within normal limits. S shaped scoliosis. IMPRESSION: No rib fracture. Clear lungs. Electronically Signed   By: Ulyses Jarred M.D.   On: 09/10/2022 00:13   CT CHEST ABDOMEN PELVIS W CONTRAST  Result Date: 09/08/2022 CLINICAL DATA:  Left rib pain after unwitnessed fall 2 days ago. Back pain. EXAM: CT CHEST, ABDOMEN, AND PELVIS WITH CONTRAST TECHNIQUE: Multidetector CT imaging of the chest, abdomen and pelvis was performed following the standard protocol during bolus administration of intravenous contrast. RADIATION DOSE REDUCTION: This exam was performed according to the departmental dose-optimization program which includes automated exposure control, adjustment of the mA and/or kV according to patient size and/or use of iterative reconstruction technique. CONTRAST:  41m OMNIPAQUE IOHEXOL 300 MG/ML  SOLN COMPARISON:  Chest CT 05/02/2021 FINDINGS: CT CHEST FINDINGS Cardiovascular: Heart size upper normal. No substantial  pericardial effusion. Coronary artery calcification is evident. Aortic valve calcification evident. Moderate atherosclerotic calcification is noted in the wall of the thoracic aorta. No large central pulmonary embolus. Mediastinum/Nodes: No mediastinal lymphadenopathy. There is no hilar lymphadenopathy. Moderate to large hiatal hernia noted. There is no axillary lymphadenopathy. Lungs/Pleura: Scattered areas of peripheral clustered tree-in-bud micro nodularity identified in both lungs, suggesting sequelae of atypical infection. Left lower lobe compressive atelectasis noted adjacent to the hiatal hernia. No overtly suspicious pulmonary nodule or mass. No focal airspace consolidation. There is no  evidence of pleural effusion. Musculoskeletal: Nonacute rib fractures are identified posteriorly. Nonacute fracture nonunion identified posterior right ninth rib. Thoracic spine compression fractures are noted at T3, T7, and T11. CT ABDOMEN PELVIS FINDINGS Hepatobiliary: No suspicious focal abnormality within the liver parenchyma. There is no evidence for gallstones, gallbladder wall thickening, or pericholecystic fluid. No intrahepatic or extrahepatic biliary dilation. Pancreas: No focal mass lesion. No dilatation of the main duct. No intraparenchymal cyst. No peripancreatic edema. Spleen: No splenomegaly. No focal mass lesion. Adrenals/Urinary Tract: No adrenal nodule or mass. Kidneys unremarkable. No evidence for hydroureter. The urinary bladder appears normal for the degree of distention. Stomach/Bowel: Large hiatal hernia contains 50-75% of the stomach. Duodenum is normally positioned as is the ligament of Treitz. No small bowel wall thickening. No small bowel dilatation. The terminal ileum is normal. The appendix is not well visualized, but there is no edema or inflammation in the region of the cecum. No gross colonic mass. No colonic wall thickening. Diverticuli are seen scattered along the entire length of the colon without CT findings of diverticulitis. Diverticular disease is most advanced in the sigmoid segment. Vascular/Lymphatic: There is advanced atherosclerotic calcification of the abdominal aorta. Fusiform dilatation of the infrarenal abdominal aorta up to 3.0 cm diameter evident. There is no gastrohepatic or hepatoduodenal ligament lymphadenopathy. No retroperitoneal or mesenteric lymphadenopathy. No pelvic sidewall lymphadenopathy. Reproductive: The uterus is surgically absent. There is no adnexal mass. Other: No intraperitoneal free fluid. Musculoskeletal: No worrisome lytic or sclerotic osseous abnormality. Thoracolumbar scoliosis evident. IMPRESSION: 1. No acute findings in the chest, abdomen,  or pelvis. 2. Scattered areas of peripheral clustered tree-in-bud micro nodularity in both lungs, suggesting sequelae of atypical infection. Findings are improved compared to chest CT 05/02/2021. 3. Moderate to large hiatal hernia contains 50-75% of the stomach. 4. 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 5. Thoracic spine compression fractures at T3, T7, and T11. 6. Fusiform dilatation of the infrarenal abdominal aorta up to 3.0 cm diameter. Recommend follow-up ultrasound every 3 years. This recommendation follows ACR consensus guidelines: White Paper of the ACR Incidental Findings Committee II on Vascular Findings. J Am Coll Radiol 2013; 10:789-794. 7. Colonic diverticulosis without diverticulitis. 8. Aortic Atherosclerosis (ICD10-I70.0). Electronically Signed   By: Misty Stanley M.D.   On: 09/08/2022 06:45   CT CERVICAL SPINE WO CONTRAST  Result Date: 09/08/2022 CLINICAL DATA:  88 year old female status post unwitnessed fall 2 days ago. Rib pain. EXAM: CT CERVICAL SPINE WITHOUT CONTRAST TECHNIQUE: Multidetector CT imaging of the cervical spine was performed without intravenous contrast. Multiplanar CT image reconstructions were also generated. RADIATION DOSE REDUCTION: This exam was performed according to the departmental dose-optimization program which includes automated exposure control, adjustment of the mA and/or kV according to patient size and/or use of iterative reconstruction technique. COMPARISON:  Cervical spine CT 12/15/2020. FINDINGS: Alignment: Mildly exaggerated cervical lordosis has not significantly changed since 2021, with mild chronic anterolisthesis at both C2-C3 and  C6-C7. Subtle chronic anterolisthesis at C7-T1. Bilateral posterior element alignment is within normal limits. Skull base and vertebrae: Osteopenia. Visualized skull base is intact. No atlanto-occipital dissociation. C1 and C2 appear stable. Stable chronic C6 mild vertebral loss  of height. No acute osseous abnormality identified. Soft tissues and spinal canal: No prevertebral fluid or swelling. No visible canal hematoma. Stable visible noncontrast neck soft tissues, calcified carotid atherosclerosis. Evidence of chronic left vertebral artery tortuosity. Disc levels: Advanced chronic cervical spine degeneration appears stable since 2021. Mild multifactorial spinal stenosis suspected at C3-C4. Upper chest: Chest CT today is reported separately. IMPRESSION: 1. No acute traumatic injury identified in the cervical spine. 2. Osteopenia and advanced chronic cervical spine degeneration appear stable since 2021. 3. Chest CT today is reported separately. Electronically Signed   By: Genevie Ann M.D.   On: 09/08/2022 06:44   CT HEAD WO CONTRAST (5MM)  Result Date: 09/08/2022 CLINICAL DATA:  88 year old female status post unwitnessed fall 2 days ago. Rib pain. EXAM: CT HEAD WITHOUT CONTRAST TECHNIQUE: Contiguous axial images were obtained from the base of the skull through the vertex without intravenous contrast. RADIATION DOSE REDUCTION: This exam was performed according to the departmental dose-optimization program which includes automated exposure control, adjustment of the mA and/or kV according to patient size and/or use of iterative reconstruction technique. COMPARISON:  Head CT 05/02/2021. FINDINGS: Brain: Stable cerebral volume. No midline shift, ventriculomegaly, intracranial hemorrhage or evidence of cortically based acute infarction. Patchy mild to moderate for age bilateral cerebral white matter hypodensity appears stable from last year. Subtle chronic rounded and mildly hyperdense right posterior para falcine meningioma is up to 22 mm long axis (coronal image 48) but not significantly changed since 2021. No associated cerebral edema, and no significant regional mass effect. Vascular: Calcified atherosclerosis at the skull base. No suspicious intracranial vascular hyperdensity. Dominant  appearing distal left vertebral artery. Skull: Stable.  No acute osseous abnormality identified. Sinuses/Orbits: Visualized paranasal sinuses and mastoids are stable and well aerated. Other: No acute orbit or scalp soft tissue finding. There is some chronic scalp vessel calcified atherosclerosis. IMPRESSION: 1. No acute intracranial abnormality or acute traumatic injury identified. 2. 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 No cerebral edema, suggesting this is clinically silent. 3. Stable mild to moderate for age cerebral white matter changes, most commonly due to chronic small vessel disease. Electronically Signed   By: Genevie Ann M.D.   On: 09/08/2022 06:40   DG Ribs Bilateral W/Chest  Result Date: 09/08/2022 CLINICAL DATA:  88 year old female status post unwitnessed fall. Upper back pain. EXAM: BILATERAL RIBS AND CHEST - 4+ VIEW COMPARISON:  Rib radiographs 12/14/2021.  Chest CT 05/02/2021. FINDINGS: Semi upright AP view of the chest at 0435 hours. Stable cardiomegaly and mediastinal contours. Chronic sternotomy. Moderate to large chronic gastric hiatal hernia better demonstrated by CT. Pronounced Calcified aortic atherosclerosis. Lower lung volumes compared to last year. Vague increased bibasilar opacity with no air bronchograms. No pneumothorax. Pulmonary vascularity appears stable. Osteopenia. Multiple chronic left lateral rib fractures, demonstrated by CT last year. No acute displaced rib fracture identified. Other visible osseous structures appear grossly intact, chronic thoracolumbar scoliosis. Paucity of bowel gas in the upper abdomen. IMPRESSION: 1. No acute displaced rib fracture identified. Multiple previous left lateral rib fractures as seen by CT last year. 2. Chronic cardiomegaly, hiatal hernia, Aortic Atherosclerosis (ICD10-I70.0). 3. Lower lung volumes with vague bibasilar opacity, favor atelectasis. Electronically  Signed   By: Genevie Ann M.D.   On: 09/08/2022 05:11    Assessment/Plan 1. Compression fracture of body of thoracic vertebra (HCC) Pain Control with Percocet, Lidocaine patches Tylenol and robaxin  2. Compression fracture of L1 vertebra with routine healing, subsequent encounter Pain control and therapy  3. Moderate late onset Alzheimer's dementia with other behavioral disturbance ([place]) Patient is having worsening behaviors and is unable to continue in assisted living I discussed this with the patient Sister She is on Zyprexa We will get you Ativan at night Also was started on Depakote 125 mg at night She will continue to have sitters in the evening and we will transfer her to skilled care.  4. Frequent falls She is working with therapy will be transferred to skilled care so that she has more help with her ADLs  5. Anxiety and depression We will continue Zoloft and Ativan 6 hypokalemia On supplement 7 history of PAF Not on any anticoagulation due to history of falls and anemia 8 history of iron deficiency anemia Refused work-up.  Hemoglobin has been stable on iron On Protonix 9 HTN On Number of Meds Norvasc,HCTZ and Cozaar Metoprolol 10 HLD On Statin 11 CAD No on aspirin due to GI bleed Statin   Labs/tests ordered:  CBC,CMP in 6 weeks on Depakote Next appt:  Visit date not found

## 2022-09-22 ENCOUNTER — Encounter: Payer: Self-pay | Admitting: Internal Medicine

## 2022-09-22 DIAGNOSIS — R4189 Other symptoms and signs involving cognitive functions and awareness: Secondary | ICD-10-CM | POA: Diagnosis not present

## 2022-09-22 DIAGNOSIS — I4891 Unspecified atrial fibrillation: Secondary | ICD-10-CM | POA: Diagnosis not present

## 2022-09-22 DIAGNOSIS — M545 Low back pain, unspecified: Secondary | ICD-10-CM | POA: Diagnosis not present

## 2022-09-22 DIAGNOSIS — M6281 Muscle weakness (generalized): Secondary | ICD-10-CM | POA: Diagnosis not present

## 2022-09-22 DIAGNOSIS — R41841 Cognitive communication deficit: Secondary | ICD-10-CM | POA: Diagnosis not present

## 2022-09-22 DIAGNOSIS — Z9181 History of falling: Secondary | ICD-10-CM | POA: Diagnosis not present

## 2022-09-25 DIAGNOSIS — R4189 Other symptoms and signs involving cognitive functions and awareness: Secondary | ICD-10-CM | POA: Diagnosis not present

## 2022-09-25 DIAGNOSIS — R41841 Cognitive communication deficit: Secondary | ICD-10-CM | POA: Diagnosis not present

## 2022-09-25 DIAGNOSIS — R2681 Unsteadiness on feet: Secondary | ICD-10-CM | POA: Diagnosis not present

## 2022-09-25 DIAGNOSIS — I10 Essential (primary) hypertension: Secondary | ICD-10-CM | POA: Diagnosis not present

## 2022-09-25 DIAGNOSIS — I4891 Unspecified atrial fibrillation: Secondary | ICD-10-CM | POA: Diagnosis not present

## 2022-09-25 DIAGNOSIS — M545 Low back pain, unspecified: Secondary | ICD-10-CM | POA: Diagnosis not present

## 2022-09-25 DIAGNOSIS — M6281 Muscle weakness (generalized): Secondary | ICD-10-CM | POA: Diagnosis not present

## 2022-09-25 DIAGNOSIS — Z9181 History of falling: Secondary | ICD-10-CM | POA: Diagnosis not present

## 2022-09-26 DIAGNOSIS — R41841 Cognitive communication deficit: Secondary | ICD-10-CM | POA: Diagnosis not present

## 2022-09-26 DIAGNOSIS — R4189 Other symptoms and signs involving cognitive functions and awareness: Secondary | ICD-10-CM | POA: Diagnosis not present

## 2022-09-26 DIAGNOSIS — Z9181 History of falling: Secondary | ICD-10-CM | POA: Diagnosis not present

## 2022-09-26 DIAGNOSIS — M6281 Muscle weakness (generalized): Secondary | ICD-10-CM | POA: Diagnosis not present

## 2022-09-26 DIAGNOSIS — I4891 Unspecified atrial fibrillation: Secondary | ICD-10-CM | POA: Diagnosis not present

## 2022-09-26 DIAGNOSIS — M545 Low back pain, unspecified: Secondary | ICD-10-CM | POA: Diagnosis not present

## 2022-09-27 ENCOUNTER — Encounter: Payer: Self-pay | Admitting: Internal Medicine

## 2022-09-27 DIAGNOSIS — Z9181 History of falling: Secondary | ICD-10-CM | POA: Diagnosis not present

## 2022-09-27 DIAGNOSIS — M545 Low back pain, unspecified: Secondary | ICD-10-CM | POA: Diagnosis not present

## 2022-09-27 DIAGNOSIS — R4189 Other symptoms and signs involving cognitive functions and awareness: Secondary | ICD-10-CM | POA: Diagnosis not present

## 2022-09-27 DIAGNOSIS — M6281 Muscle weakness (generalized): Secondary | ICD-10-CM | POA: Diagnosis not present

## 2022-09-27 DIAGNOSIS — I4891 Unspecified atrial fibrillation: Secondary | ICD-10-CM | POA: Diagnosis not present

## 2022-09-27 DIAGNOSIS — R41841 Cognitive communication deficit: Secondary | ICD-10-CM | POA: Diagnosis not present

## 2022-09-28 DIAGNOSIS — M6281 Muscle weakness (generalized): Secondary | ICD-10-CM | POA: Diagnosis not present

## 2022-09-28 DIAGNOSIS — R4189 Other symptoms and signs involving cognitive functions and awareness: Secondary | ICD-10-CM | POA: Diagnosis not present

## 2022-09-28 DIAGNOSIS — I4891 Unspecified atrial fibrillation: Secondary | ICD-10-CM | POA: Diagnosis not present

## 2022-09-28 DIAGNOSIS — M545 Low back pain, unspecified: Secondary | ICD-10-CM | POA: Diagnosis not present

## 2022-09-28 DIAGNOSIS — Z9181 History of falling: Secondary | ICD-10-CM | POA: Diagnosis not present

## 2022-09-28 DIAGNOSIS — R41841 Cognitive communication deficit: Secondary | ICD-10-CM | POA: Diagnosis not present

## 2022-09-29 DIAGNOSIS — Z9181 History of falling: Secondary | ICD-10-CM | POA: Diagnosis not present

## 2022-09-29 DIAGNOSIS — I4891 Unspecified atrial fibrillation: Secondary | ICD-10-CM | POA: Diagnosis not present

## 2022-09-29 DIAGNOSIS — M6281 Muscle weakness (generalized): Secondary | ICD-10-CM | POA: Diagnosis not present

## 2022-09-29 DIAGNOSIS — R41841 Cognitive communication deficit: Secondary | ICD-10-CM | POA: Diagnosis not present

## 2022-09-29 DIAGNOSIS — R4189 Other symptoms and signs involving cognitive functions and awareness: Secondary | ICD-10-CM | POA: Diagnosis not present

## 2022-09-29 DIAGNOSIS — M545 Low back pain, unspecified: Secondary | ICD-10-CM | POA: Diagnosis not present

## 2022-10-02 DIAGNOSIS — Z9181 History of falling: Secondary | ICD-10-CM | POA: Diagnosis not present

## 2022-10-02 DIAGNOSIS — M6281 Muscle weakness (generalized): Secondary | ICD-10-CM | POA: Diagnosis not present

## 2022-10-02 DIAGNOSIS — I4891 Unspecified atrial fibrillation: Secondary | ICD-10-CM | POA: Diagnosis not present

## 2022-10-02 DIAGNOSIS — M545 Low back pain, unspecified: Secondary | ICD-10-CM | POA: Diagnosis not present

## 2022-10-02 DIAGNOSIS — R4189 Other symptoms and signs involving cognitive functions and awareness: Secondary | ICD-10-CM | POA: Diagnosis not present

## 2022-10-02 DIAGNOSIS — R41841 Cognitive communication deficit: Secondary | ICD-10-CM | POA: Diagnosis not present

## 2022-10-03 DIAGNOSIS — R41841 Cognitive communication deficit: Secondary | ICD-10-CM | POA: Diagnosis not present

## 2022-10-03 DIAGNOSIS — R4189 Other symptoms and signs involving cognitive functions and awareness: Secondary | ICD-10-CM | POA: Diagnosis not present

## 2022-10-03 DIAGNOSIS — M6281 Muscle weakness (generalized): Secondary | ICD-10-CM | POA: Diagnosis not present

## 2022-10-03 DIAGNOSIS — Z9181 History of falling: Secondary | ICD-10-CM | POA: Diagnosis not present

## 2022-10-03 DIAGNOSIS — M545 Low back pain, unspecified: Secondary | ICD-10-CM | POA: Diagnosis not present

## 2022-10-03 DIAGNOSIS — I4891 Unspecified atrial fibrillation: Secondary | ICD-10-CM | POA: Diagnosis not present

## 2022-10-04 DIAGNOSIS — I4891 Unspecified atrial fibrillation: Secondary | ICD-10-CM | POA: Diagnosis not present

## 2022-10-04 DIAGNOSIS — R4189 Other symptoms and signs involving cognitive functions and awareness: Secondary | ICD-10-CM | POA: Diagnosis not present

## 2022-10-04 DIAGNOSIS — M6281 Muscle weakness (generalized): Secondary | ICD-10-CM | POA: Diagnosis not present

## 2022-10-04 DIAGNOSIS — M545 Low back pain, unspecified: Secondary | ICD-10-CM | POA: Diagnosis not present

## 2022-10-04 DIAGNOSIS — R41841 Cognitive communication deficit: Secondary | ICD-10-CM | POA: Diagnosis not present

## 2022-10-04 DIAGNOSIS — Z9181 History of falling: Secondary | ICD-10-CM | POA: Diagnosis not present

## 2022-10-05 ENCOUNTER — Non-Acute Institutional Stay (SKILLED_NURSING_FACILITY): Payer: Medicare Other | Admitting: Internal Medicine

## 2022-10-05 DIAGNOSIS — F419 Anxiety disorder, unspecified: Secondary | ICD-10-CM

## 2022-10-05 DIAGNOSIS — S32010D Wedge compression fracture of first lumbar vertebra, subsequent encounter for fracture with routine healing: Secondary | ICD-10-CM

## 2022-10-05 DIAGNOSIS — M545 Low back pain, unspecified: Secondary | ICD-10-CM | POA: Diagnosis not present

## 2022-10-05 DIAGNOSIS — M6281 Muscle weakness (generalized): Secondary | ICD-10-CM | POA: Diagnosis not present

## 2022-10-05 DIAGNOSIS — G301 Alzheimer's disease with late onset: Secondary | ICD-10-CM | POA: Diagnosis not present

## 2022-10-05 DIAGNOSIS — F02B18 Dementia in other diseases classified elsewhere, moderate, with other behavioral disturbance: Secondary | ICD-10-CM | POA: Diagnosis not present

## 2022-10-05 DIAGNOSIS — E7800 Pure hypercholesterolemia, unspecified: Secondary | ICD-10-CM | POA: Diagnosis not present

## 2022-10-05 DIAGNOSIS — I10 Essential (primary) hypertension: Secondary | ICD-10-CM

## 2022-10-05 DIAGNOSIS — F32A Depression, unspecified: Secondary | ICD-10-CM

## 2022-10-05 DIAGNOSIS — S22000A Wedge compression fracture of unspecified thoracic vertebra, initial encounter for closed fracture: Secondary | ICD-10-CM

## 2022-10-05 DIAGNOSIS — I4891 Unspecified atrial fibrillation: Secondary | ICD-10-CM | POA: Diagnosis not present

## 2022-10-05 DIAGNOSIS — R296 Repeated falls: Secondary | ICD-10-CM

## 2022-10-05 DIAGNOSIS — R4189 Other symptoms and signs involving cognitive functions and awareness: Secondary | ICD-10-CM | POA: Diagnosis not present

## 2022-10-05 DIAGNOSIS — D509 Iron deficiency anemia, unspecified: Secondary | ICD-10-CM

## 2022-10-05 DIAGNOSIS — Z9181 History of falling: Secondary | ICD-10-CM | POA: Diagnosis not present

## 2022-10-05 DIAGNOSIS — R41841 Cognitive communication deficit: Secondary | ICD-10-CM | POA: Diagnosis not present

## 2022-10-06 DIAGNOSIS — Z9181 History of falling: Secondary | ICD-10-CM | POA: Diagnosis not present

## 2022-10-06 DIAGNOSIS — I4891 Unspecified atrial fibrillation: Secondary | ICD-10-CM | POA: Diagnosis not present

## 2022-10-06 DIAGNOSIS — M545 Low back pain, unspecified: Secondary | ICD-10-CM | POA: Diagnosis not present

## 2022-10-06 DIAGNOSIS — R4189 Other symptoms and signs involving cognitive functions and awareness: Secondary | ICD-10-CM | POA: Diagnosis not present

## 2022-10-06 DIAGNOSIS — R41841 Cognitive communication deficit: Secondary | ICD-10-CM | POA: Diagnosis not present

## 2022-10-06 DIAGNOSIS — M6281 Muscle weakness (generalized): Secondary | ICD-10-CM | POA: Diagnosis not present

## 2022-10-08 ENCOUNTER — Encounter: Payer: Self-pay | Admitting: Internal Medicine

## 2022-10-08 NOTE — Progress Notes (Signed)
Provider:   Location:  [place] of Service:  SNF (31)  PCP: [name], MD Patient Care Team: [name], MD as PCP - General (Internal Medicine) [place], Peter M, MD as PCP - Cardiology (Cardiology)  Extended Emergency Contact Information Primary Emergency Contact: [name],[name] Address: [address]          [place] [address] [place] Phone: [phone] Mobile Phone: [phone] Relation: Sister Secondary Emergency Contact: [name],[name]  [name] of [place] Phone: [phone] Mobile Phone: [phone] Relation: Niece  Code Status: DNR Goals of Care: Advanced Directive information    09/21/2022   12:50 PM  Advanced Directives  Does Patient Have a Medical Advance Directive? Yes  Type of Paramedic of [place];Living will;Out of facility DNR (pink MOST or yellow form)  Does patient want to make changes to medical advance directive? No - Patient declined  Copy of [place] in Chart? Yes - validated most recent copy scanned in chart (See row information)  Pre-existing out of facility DNR order (yellow form or pink MOST form) Pink Most/Yellow Form available - Physician notified to receive inpatient order      Chief Complaint  Patient presents with   New Admit To SNF    HPI: Patient is a 88 y.o. female seen today for admission to SNF  [place] living [place] but had to be transferred to SNF as she was unable to do her ADLS and Having behavior issues  Patient has h/o  has h/o CAD s/p Angioplasty and Stenting in RCA HTN, HLD Moderate Aortic stenosis,  H/o PAF Underwent DCCV No Anticoagulation due to her h/o Falls and Acute Anemia Chronic Back pain Unstable Gait and Falls Anemia Refused GI work up Dementia   [place] living [place]  when she fell backwards in her bathroom  Since then patient has been having pain in her back and chest She had  x-rays done here Cervical x-rays were negative for any fracture she had a compression fracture of T6 and T10 Also had a compression fracture of L3 All these are indeterminate age   Since then she has been  getting very anxious Especially in the evening.  She Screams that there is nobody helping her.   Also continues to have falls. Had to go to ED number of times Was unable to do her ADLS It was decided to transfer her To SNF She is doing better here though still needs help with her ADLS Gets confused very easily Pain seems more controlled Had no acute issue today Mostly staying in her Wheelchair. Has had Fall since she has been here as she tries to get up without calling for help Needs total help with her transfers  Past Medical History:  Diagnosis Date   Anxiety and depression    Aortic stenosis    a. 03/2014: Mild aortic stenosis by valve area and mean gradient though appeared more visually consistent with moderate AS.    CAD (coronary artery disease)    a. s/p prior PCI of D2 in 1992 b. stent to RCA in 2005   Diverticula of colon    GERD (gastroesophageal reflux disease)    HTN (hypertension)    Hypercholesteremia    Lateral myocardial infarction [facility]) 1992   Osteoarthritis    Osteopenia    Scoliosis    Past Surgical History:  Procedure Laterality Date   ABDOMINAL HYSTERECTOMY  APPENDECTOMY     CORONARY ANGIOPLASTY     TONSILLECTOMY      reports that she quit smoking about 30 years ago. Her smoking use included cigarettes. She has never used smokeless tobacco. She reports that she does not drink alcohol and does not use drugs. Social History   Socioeconomic History   Marital status: Single    Spouse name: Not on file   Number of children: 1   Years of education: Not on file   Highest education level: Not on file  Occupational History   Occupation: retired  Tobacco Use   Smoking status: Former    Types: Cigarettes    Quit date: 12/26/1991    Years since  quitting: 30.8   Smokeless tobacco: Never  Vaping Use   Vaping Use: Never used  Substance and Sexual Activity   Alcohol use: No    Alcohol/week: 0.0 standard drinks of alcohol   Drug use: Never   Sexual activity: Not on file  Other Topics Concern   Not on file  Social History Narrative   Not on file   Social Determinants of Health   Financial Resource Strain: Low Risk  (03/20/2022)   Overall Financial Resource Strain (CARDIA)    Difficulty of Paying Living Expenses: Not hard at all  Food Insecurity: No Food Insecurity (03/20/2022)   Hunger Vital Sign    Worried About Running Out of Food in the Last Year: Never true    [place] in the Last Year: Never true  Transportation Needs: No Transportation Needs (03/20/2022)   PRAPARE - Hydrologist (Medical): No    Lack of Transportation (Non-Medical): No  Physical Activity: Insufficiently Active (03/20/2022)   Exercise Vital Sign    Days of Exercise per Week: 5 days    Minutes of Exercise per Session: 20 min  Stress: No Stress Concern Present (03/20/2022)   [place]    Feeling of Stress : Not at all  Social Connections: Socially Isolated (03/20/2022)   Social Connection and Isolation Panel [NHANES]    Frequency of Communication with Friends and Family: Twice a week    Frequency of Social Gatherings with Friends and Family: Twice a week    Attends Religious Services: Never    Marine scientist or Organizations: No    Attends Archivist Meetings: Never    Marital Status: Divorced  Human resources officer Violence: Not At Risk (03/20/2022)   Humiliation, Afraid, Rape, and Kick questionnaire    Fear of Current or Ex-Partner: No    Emotionally Abused: No    Physically Abused: No    Sexually Abused: No    Functional Status Survey:    Family History  Problem Relation Age of Onset   Hypertension Father    Heart failure Mother     Hypertension Mother    Cancer Brother     Health Maintenance  Topic Date Due   INFLUENZA VACCINE  07/25/2022   TETANUS/TDAP  03/20/2032   Pneumonia Vaccine 58+ Years old  Completed   DEXA SCAN  Completed   COVID-19 Vaccine  Completed   Zoster Vaccines- Shingrix  Completed   HPV VACCINES  Aged Out    Allergies  Allergen Reactions   Latex Other (See Comments)    Unknown reaction - listed on [facility] 02/10/22   Nifedipine Other (See Comments)    Dark Urine; can only take orange tablet, allergic to  yellow-brown tablet   Tetanus Toxoids Other (See Comments)    Unknown reaction   Tetanus-Diphtheria Toxoids Td Other (See Comments)    Unknown reaction   Adhesive [Tape] Rash    Outpatient Encounter Medications as of 10/05/2022  Medication Sig   acetaminophen (TYLENOL) 500 MG tablet Take 500 mg by mouth in the morning, at noon, and at bedtime.   amLODipine (NORVASC) 5 MG tablet Take 5 mg by mouth daily.   atorvastatin (LIPITOR) 10 MG tablet Take 10 mg by mouth at bedtime.   cetirizine (ZYRTEC) 10 MG tablet Take 10 mg by mouth every morning.   cholecalciferol (VITAMIN D) 25 MCG (1000 UNIT) tablet Take 2,000 Units by mouth every evening.   divalproex (DEPAKOTE SPRINKLE) 125 MG capsule Take 125 mg by mouth at bedtime.   ferrous sulfate 325 (65 FE) MG tablet Take 325 mg by mouth daily with breakfast.   folic acid (FOLVITE) 1 MG tablet Take 1 mg by mouth daily.   hydrochlorothiazide (HYDRODIURIL) 25 MG tablet Take 25 mg by mouth in the morning.   LORazepam (ATIVAN) 0.5 MG tablet Take 0.5 mg by mouth every 8 (eight) hours.   losartan (COZAAR) 100 MG tablet Take 100 mg by mouth at bedtime.   methocarbamol (ROBAXIN) 500 MG tablet Take 250 mg by mouth every 6 (six) hours as needed for muscle spasms.   metoprolol succinate (TOPROL-XL) 50 MG 24 hr tablet Take 50 mg by mouth every evening. Take with or immediately following a meal.   nitroGLYCERIN (NITROSTAT) 0.4 MG SL tablet Place 0.4 mg under  the tongue every 5 (five) minutes as needed for chest pain. As needed fo chest pain   OLANZapine (ZYPREXA) 2.5 MG tablet Take 5 mg by mouth in the morning and at bedtime.   oxyCODONE-acetaminophen (PERCOCET/ROXICET) 5-325 MG tablet Take 1 tablet by mouth every 4 (four) hours as needed for severe pain.   oxyCODONE-acetaminophen (PERCOCET/ROXICET) 5-325 MG tablet Take 1 tablet by mouth every 4 (four) hours as needed for severe pain.   pantoprazole (PROTONIX) 40 MG tablet Take 40 mg by mouth daily.   polyethylene glycol powder (MIRALAX) 17 GM/SCOOP powder Take 1 Container by mouth once.   potassium chloride (KLOR-CON M) 10 MEQ tablet Take 10 mEq by mouth daily.   senna (SENOKOT) 8.6 MG TABS tablet Take 1 tablet by mouth in the morning.   sertraline (ZOLOFT) 25 MG tablet Take 50 mg by mouth at bedtime.   No facility-administered encounter medications on file as of 10/05/2022.    Review of Systems  Constitutional:  Positive for activity change. Negative for appetite change.  HENT: Negative.    Respiratory:  Negative for cough and shortness of breath.   Cardiovascular:  Negative for leg swelling.  Gastrointestinal:  Negative for constipation.  Genitourinary: Negative.   Musculoskeletal:  Positive for gait problem. Negative for arthralgias and myalgias.  Skin: Negative.   Neurological:  Positive for weakness. Negative for dizziness.  Psychiatric/Behavioral:  Positive for behavioral problems and confusion. Negative for dysphoric mood and sleep disturbance.     Vitals:   10/08/22 1844  BP: (!) 140/70  Pulse: 60  Resp: 18  Temp: (!) 97.1 F (36.2 C)  Weight: 120 lb (54.4 kg)   Body mass index is 23.44 kg/m. Physical Exam Vitals reviewed.  Constitutional:      Appearance: Normal appearance.  HENT:     Head: Normocephalic.     Nose: Nose normal.     Mouth/Throat:  Mouth: Mucous membranes are moist.     Pharynx: Oropharynx is clear.  Eyes:     Pupils: Pupils are equal, round,  and reactive to light.  Cardiovascular:     Rate and Rhythm: Normal rate and regular rhythm.     Pulses: Normal pulses.     Heart sounds: Murmur heard.  Pulmonary:     Effort: Pulmonary effort is normal.     Breath sounds: Normal breath sounds.  Abdominal:     General: Abdomen is flat. Bowel sounds are normal.     Palpations: Abdomen is soft.  Musculoskeletal:        General: No swelling.     Cervical back: Neck supple.  Skin:    General: Skin is warm.  Neurological:     General: No focal deficit present.     Mental Status: She is alert.  Psychiatric:        Mood and Affect: Mood normal.        Thought Content: Thought content normal.     Labs reviewed: Basic Metabolic Panel: Recent Labs    02/11/22 0108 06/05/22 0000 09/08/22 0422 09/11/22 0730  NA 138 142 134* 135  K 3.0* 3.9 3.2* 3.4*  CL 106 107 101 96*  CO2 24 29* 25 29  GLUCOSE 110*  --  106* 67  BUN '11 16 10 16  '$ CREATININE 0.64 0.5 0.38* 0.62  CALCIUM 8.3* 8.7 8.9 9.0   Liver Function Tests: Recent Labs    11/24/21 0000 02/09/22 0000 02/10/22 1303 09/11/22 0730  AST '16 23 28 18  '$ ALT '12 22 25 11  '$ ALKPHOS 106 90 97  --   BILITOT  --   --  0.5 0.6  PROT  --   --  6.3* 6.6  ALBUMIN 3.2* 3.7 3.2*  --    No results for input(s): "LIPASE", "AMYLASE" in the last 8760 hours. No results for input(s): "AMMONIA" in the last 8760 hours. CBC: Recent Labs    02/11/22 0108 02/20/22 0000 03/09/22 0000 06/05/22 0000 09/08/22 0422 09/11/22 0730  WBC 5.8   < > 3.7 4.2 6.7 5.5  NEUTROABS 4.1   < > 1,550.00  --  5.0 3,801  HGB 8.0*   < > 10.7* 12.5 14.1 14.5  HCT 26.6*   < > 36 39 43.2 42.1  MCV 80.4  --   --   --  95.6 89.6  PLT 216   < > 266 209 220 236   < > = values in this interval not displayed.   Cardiac Enzymes: No results for input(s): "CKTOTAL", "CKMB", "CKMBINDEX", "TROPONINI" in the last 8760 hours. BNP: Invalid input(s): "POCBNP" No results found for: "HGBA1C" Lab Results  Component Value  Date   TSH 2.90 11/24/2021   Lab Results  Component Value Date   VITAMINB12 312 09/21/2020   No results found for: "FOLATE" Lab Results  Component Value Date   IRON 13 (L) 09/20/2020   TIBC 420 09/20/2020   FERRITIN 7 (L) 09/20/2020    Imaging and Procedures obtained prior to SNF admission: CT Chest Wo Contrast  Result Date: 09/16/2022 CLINICAL DATA:  88 year old female with history of blunt trauma to the chest. Back pain. EXAM: CT CHEST WITHOUT CONTRAST TECHNIQUE: Multidetector CT imaging of the chest was performed following the standard protocol without IV contrast. RADIATION DOSE REDUCTION: This exam was performed according to the departmental dose-optimization program which includes automated exposure control, adjustment of the mA and/or kV according to patient size  and/or use of iterative reconstruction technique. COMPARISON:  Chest CT 09/08/2022. FINDINGS: Cardiovascular: Heart size is normal. There is no significant pericardial fluid, thickening or pericardial calcification. There is aortic atherosclerosis, as well as atherosclerosis of the great vessels of the mediastinum and the coronary arteries, including calcified atherosclerotic plaque in the left main, left anterior descending, left circumflex and right coronary arteries. Severe calcifications of the aortic valve. Mediastinum/Nodes: No pathologically enlarged mediastinal or hilar lymph nodes. Please note that accurate exclusion of hilar adenopathy is limited on noncontrast CT scans. Large hiatal hernia. No axillary lymphadenopathy. Lungs/Pleura: No acute consolidative airspace disease. No pleural effusions. Widespread ill-defined tiny 1-3 mm pulmonary nodules scattered throughout all aspects of the lungs bilaterally, predominantly centrilobular in distribution, likely areas of benign mucoid impaction within terminal bronchioles. No other larger more suspicious appearing pulmonary nodules or masses are noted. Upper Abdomen: Aortic  atherosclerosis with aneurysmal dilatation of the infrarenal abdominal aorta which measures up to 3.1 cm in diameter. Musculoskeletal: Multiple vertebral body compression fractures are again noted at T3, T7, T11 and L1. These appear essentially unchanged, with the exception of T7 where there has been further compression, now with 90% loss of anterior vertebral body height and 60% loss of posterior vertebral body height. Sternotomy wires in the manubrium and upper sternum. Multiple old healed right-sided rib fractures involving the posterior aspect of the right eighth, ninth and tenth ribs with chronic nonunion of the ninth rib fracture, unchanged. Severe levoscoliosis of the lumbar spine. IMPRESSION: 1. Multiple chronic thoracic and lumbar vertebral body compression fractures, including one of which that appears progressive at T7, as above. 2. Multiple tiny benign-appearing 1-3 mm pulmonary nodules, similar to prior studies, likely benign areas of mucoid impaction. No follow-up needed if patient is low-risk (and has no known or suspected primary neoplasm). Non-contrast chest CT can be considered in 12 months if patient is high-risk. This recommendation follows the consensus statement: Guidelines for Management of Incidental Pulmonary Nodules Detected on CT Images: From the Fleischner Society 2017; Radiology 2017; 284:228-243. 3. Aortic atherosclerosis, in addition to left main and three-vessel coronary artery disease. In addition, there is aneurysmal dilatation of the infrarenal abdominal aorta measuring 3.1 cm in diameter. Recommend follow-up ultrasound every 3 years. This recommendation follows ACR consensus guidelines: White Paper of the ACR Incidental Findings Committee II on Vascular Findings. J Am Coll Radiol 2013; 10:789-794. 4. There are calcifications of the aortic valve. Echocardiographic correlation for evaluation of potential valvular dysfunction may be warranted if clinically indicated. Electronically  Signed   By: Vinnie Langton M.D.   On: 09/16/2022 06:25   DG Hip Unilat W or Wo Pelvis 2-3 Views Right  Result Date: 09/16/2022 CLINICAL DATA:  88 year old female with history of trauma from a fall complaining of right hip pain. EXAM: DG HIP (WITH OR WITHOUT PELVIS) 2-3V RIGHT COMPARISON:  No priors. FINDINGS: Three views of the bony pelvis and the right hip demonstrate no definite acute displaced fractures of the bony pelvic ring. Bilateral proximal femurs as visualized appear intact, and the right femoral head is properly located. Mild joint space narrowing, subchondral sclerosis, subchondral cyst formation and osteophyte formation is noted in the hip joints bilaterally, indicative of osteoarthritis. IMPRESSION: 1. No acute radiographic abnormality of the bony pelvis or the right hip. 2. Mild bilateral hip joint osteoarthritis. Electronically Signed   By: Vinnie Langton M.D.   On: 09/16/2022 05:25   DG Chest 1 View  Result Date: 09/16/2022 CLINICAL DATA:  88 year old female history  of back pain. EXAM: CHEST  1 VIEW COMPARISON:  Chest x-ray 09/09/2022. FINDINGS: Lung volumes are low. Opacity at the left base partially obscuring the left hemidiaphragm, favored to reflect areas of atelectasis or scarring, although underlying airspace consolidation is not excluded. Mild blunting of left costophrenic sulcus could suggest a small left pleural effusion. Right lung is clear. No right pleural effusion. No pneumothorax. No evidence of pulmonary edema. Heart size is normal. Upper mediastinal contours are within normal limits. Atherosclerotic calcifications are noted in the thoracic aorta. Sternotomy wires in the upper sternum. No definite acute displaced fractures are identified in the visualized portions of the thorax. IMPRESSION: 1. Low lung volumes with atelectasis and/or consolidation in the left lung base and probable small left pleural effusion. 2. Aortic atherosclerosis. Electronically Signed   By: Vinnie Langton M.D.   On: 09/16/2022 05:23    Assessment/Plan 1. Compression fracture of body of thoracic vertebra ([place]) Working with therapy Pain Seems controlled now  2. Compression fracture of L1 vertebra with routine healing, subsequent encounter Therapy and Pain Control  3. Moderate late onset Alzheimer's dementia with other behavioral disturbance (HCC) On Zyprexa and Depakote  4. Frequent falls Continues to be issue due to her dementia   5. Anxiety and depression On Ativan and Zoloft  6. Iron deficiency anemia, unspecified iron deficiency anemia type On iron Refused work up  7. Primary hypertension Amlodipine and Cozaar metoprolol  8. Hypercholesteremia On Statin    Family/ staff Communication:   Labs/tests ordered:

## 2022-10-09 DIAGNOSIS — M545 Low back pain, unspecified: Secondary | ICD-10-CM | POA: Diagnosis not present

## 2022-10-09 DIAGNOSIS — R41841 Cognitive communication deficit: Secondary | ICD-10-CM | POA: Diagnosis not present

## 2022-10-09 DIAGNOSIS — R4189 Other symptoms and signs involving cognitive functions and awareness: Secondary | ICD-10-CM | POA: Diagnosis not present

## 2022-10-09 DIAGNOSIS — Z9181 History of falling: Secondary | ICD-10-CM | POA: Diagnosis not present

## 2022-10-09 DIAGNOSIS — I4891 Unspecified atrial fibrillation: Secondary | ICD-10-CM | POA: Diagnosis not present

## 2022-10-09 DIAGNOSIS — M6281 Muscle weakness (generalized): Secondary | ICD-10-CM | POA: Diagnosis not present

## 2022-10-10 ENCOUNTER — Other Ambulatory Visit: Payer: Self-pay | Admitting: Adult Health

## 2022-10-10 DIAGNOSIS — I4891 Unspecified atrial fibrillation: Secondary | ICD-10-CM | POA: Diagnosis not present

## 2022-10-10 DIAGNOSIS — M545 Low back pain, unspecified: Secondary | ICD-10-CM | POA: Diagnosis not present

## 2022-10-10 DIAGNOSIS — R4189 Other symptoms and signs involving cognitive functions and awareness: Secondary | ICD-10-CM | POA: Diagnosis not present

## 2022-10-10 DIAGNOSIS — Z9181 History of falling: Secondary | ICD-10-CM | POA: Diagnosis not present

## 2022-10-10 DIAGNOSIS — M6281 Muscle weakness (generalized): Secondary | ICD-10-CM | POA: Diagnosis not present

## 2022-10-10 DIAGNOSIS — R41841 Cognitive communication deficit: Secondary | ICD-10-CM | POA: Diagnosis not present

## 2022-10-10 MED ORDER — OXYCODONE-ACETAMINOPHEN 5-325 MG PO TABS: 1.0000 | ORAL_TABLET | Freq: Two times a day (BID) | ORAL | 0 refills | Status: DC

## 2022-10-11 DIAGNOSIS — Z9181 History of falling: Secondary | ICD-10-CM | POA: Diagnosis not present

## 2022-10-11 DIAGNOSIS — I4891 Unspecified atrial fibrillation: Secondary | ICD-10-CM | POA: Diagnosis not present

## 2022-10-11 DIAGNOSIS — R4189 Other symptoms and signs involving cognitive functions and awareness: Secondary | ICD-10-CM | POA: Diagnosis not present

## 2022-10-11 DIAGNOSIS — M545 Low back pain, unspecified: Secondary | ICD-10-CM | POA: Diagnosis not present

## 2022-10-11 DIAGNOSIS — R41841 Cognitive communication deficit: Secondary | ICD-10-CM | POA: Diagnosis not present

## 2022-10-11 DIAGNOSIS — M6281 Muscle weakness (generalized): Secondary | ICD-10-CM | POA: Diagnosis not present

## 2022-10-12 DIAGNOSIS — R4189 Other symptoms and signs involving cognitive functions and awareness: Secondary | ICD-10-CM | POA: Diagnosis not present

## 2022-10-12 DIAGNOSIS — M545 Low back pain, unspecified: Secondary | ICD-10-CM | POA: Diagnosis not present

## 2022-10-12 DIAGNOSIS — M6281 Muscle weakness (generalized): Secondary | ICD-10-CM | POA: Diagnosis not present

## 2022-10-12 DIAGNOSIS — R41841 Cognitive communication deficit: Secondary | ICD-10-CM | POA: Diagnosis not present

## 2022-10-12 DIAGNOSIS — Z9181 History of falling: Secondary | ICD-10-CM | POA: Diagnosis not present

## 2022-10-12 DIAGNOSIS — I4891 Unspecified atrial fibrillation: Secondary | ICD-10-CM | POA: Diagnosis not present

## 2022-10-13 DIAGNOSIS — Z9181 History of falling: Secondary | ICD-10-CM | POA: Diagnosis not present

## 2022-10-13 DIAGNOSIS — R41841 Cognitive communication deficit: Secondary | ICD-10-CM | POA: Diagnosis not present

## 2022-10-13 DIAGNOSIS — R4189 Other symptoms and signs involving cognitive functions and awareness: Secondary | ICD-10-CM | POA: Diagnosis not present

## 2022-10-13 DIAGNOSIS — M545 Low back pain, unspecified: Secondary | ICD-10-CM | POA: Diagnosis not present

## 2022-10-13 DIAGNOSIS — M6281 Muscle weakness (generalized): Secondary | ICD-10-CM | POA: Diagnosis not present

## 2022-10-13 DIAGNOSIS — I4891 Unspecified atrial fibrillation: Secondary | ICD-10-CM | POA: Diagnosis not present

## 2022-10-15 DIAGNOSIS — R41841 Cognitive communication deficit: Secondary | ICD-10-CM | POA: Diagnosis not present

## 2022-10-15 DIAGNOSIS — M545 Low back pain, unspecified: Secondary | ICD-10-CM | POA: Diagnosis not present

## 2022-10-15 DIAGNOSIS — M6281 Muscle weakness (generalized): Secondary | ICD-10-CM | POA: Diagnosis not present

## 2022-10-15 DIAGNOSIS — Z9181 History of falling: Secondary | ICD-10-CM | POA: Diagnosis not present

## 2022-10-15 DIAGNOSIS — I4891 Unspecified atrial fibrillation: Secondary | ICD-10-CM | POA: Diagnosis not present

## 2022-10-15 DIAGNOSIS — R4189 Other symptoms and signs involving cognitive functions and awareness: Secondary | ICD-10-CM | POA: Diagnosis not present

## 2022-10-16 ENCOUNTER — Encounter: Payer: Self-pay | Admitting: Orthopedic Surgery

## 2022-10-16 ENCOUNTER — Non-Acute Institutional Stay (SKILLED_NURSING_FACILITY): Payer: Medicare Other | Admitting: Orthopedic Surgery

## 2022-10-16 DIAGNOSIS — I2510 Atherosclerotic heart disease of native coronary artery without angina pectoris: Secondary | ICD-10-CM

## 2022-10-16 DIAGNOSIS — F02B18 Dementia in other diseases classified elsewhere, moderate, with other behavioral disturbance: Secondary | ICD-10-CM

## 2022-10-16 DIAGNOSIS — I480 Paroxysmal atrial fibrillation: Secondary | ICD-10-CM | POA: Diagnosis not present

## 2022-10-16 DIAGNOSIS — G301 Alzheimer's disease with late onset: Secondary | ICD-10-CM | POA: Diagnosis not present

## 2022-10-16 DIAGNOSIS — I4891 Unspecified atrial fibrillation: Secondary | ICD-10-CM | POA: Diagnosis not present

## 2022-10-16 DIAGNOSIS — D509 Iron deficiency anemia, unspecified: Secondary | ICD-10-CM | POA: Diagnosis not present

## 2022-10-16 DIAGNOSIS — S41112A Laceration without foreign body of left upper arm, initial encounter: Secondary | ICD-10-CM

## 2022-10-16 DIAGNOSIS — I5189 Other ill-defined heart diseases: Secondary | ICD-10-CM

## 2022-10-16 DIAGNOSIS — M545 Low back pain, unspecified: Secondary | ICD-10-CM | POA: Diagnosis not present

## 2022-10-16 DIAGNOSIS — R296 Repeated falls: Secondary | ICD-10-CM

## 2022-10-16 DIAGNOSIS — S22000A Wedge compression fracture of unspecified thoracic vertebra, initial encounter for closed fracture: Secondary | ICD-10-CM

## 2022-10-16 DIAGNOSIS — S81812A Laceration without foreign body, left lower leg, initial encounter: Secondary | ICD-10-CM | POA: Diagnosis not present

## 2022-10-16 DIAGNOSIS — I10 Essential (primary) hypertension: Secondary | ICD-10-CM | POA: Diagnosis not present

## 2022-10-16 DIAGNOSIS — I2583 Coronary atherosclerosis due to lipid rich plaque: Secondary | ICD-10-CM

## 2022-10-16 DIAGNOSIS — M6281 Muscle weakness (generalized): Secondary | ICD-10-CM | POA: Diagnosis not present

## 2022-10-16 DIAGNOSIS — S32010D Wedge compression fracture of first lumbar vertebra, subsequent encounter for fracture with routine healing: Secondary | ICD-10-CM

## 2022-10-16 DIAGNOSIS — Z9181 History of falling: Secondary | ICD-10-CM | POA: Diagnosis not present

## 2022-10-16 DIAGNOSIS — R4189 Other symptoms and signs involving cognitive functions and awareness: Secondary | ICD-10-CM | POA: Diagnosis not present

## 2022-10-16 DIAGNOSIS — E7800 Pure hypercholesterolemia, unspecified: Secondary | ICD-10-CM | POA: Diagnosis not present

## 2022-10-16 DIAGNOSIS — F32A Depression, unspecified: Secondary | ICD-10-CM

## 2022-10-16 DIAGNOSIS — R41841 Cognitive communication deficit: Secondary | ICD-10-CM | POA: Diagnosis not present

## 2022-10-16 DIAGNOSIS — F419 Anxiety disorder, unspecified: Secondary | ICD-10-CM

## 2022-10-16 MED ORDER — OXYCODONE-ACETAMINOPHEN 5-325 MG PO TABS: 0.5000 | ORAL_TABLET | Freq: Two times a day (BID) | ORAL | 0 refills | Status: AC

## 2022-10-16 NOTE — Progress Notes (Signed)
Location:  [facility] Room Number: 30/A Place of Service:  SNF [phone]) Provider:  [name], NP   [name], MD  Patient Care Team: [name], MD as PCP - General (Internal Medicine) Martinique, Peter M, MD as PCP - Cardiology (Cardiology)  Extended Emergency Contact Information Primary Emergency Contact: [name],[name] Address: [address]          [place] [address] [name] of [place] Phone: [phone] Mobile Phone: [phone] Relation: Sister Secondary Emergency Contact: [name],[name]  [name] of [place] Phone: [phone] Mobile Phone: [phone] Relation: Niece  Code Status:  DNR Goals of care: Advanced Directive information    10/16/2022   12:22 PM  Advanced Directives  Does Patient Have a Medical Advance Directive? Yes  Type of Advance Directive Living will;Out of facility DNR (pink MOST or yellow form);Healthcare Power of Attorney  Does patient want to make changes to medical advance directive? No - Patient declined  Pre-existing out of facility DNR order (yellow form or pink MOST form) Pink Most/Yellow Form available - Physician notified to receive inpatient order     Chief Complaint  Patient presents with   Medical Management of Chronic Issues         HPI:  Pt is a 88 y.o. female seen today for medical management of chronic diseases.    She currently resides on the skilled living unit at [facility]. PMH: aortic stenosis,CAD, HTN, HLD, atrial fib, emphysema, GERD, vascular dementia, and depression.   Compression fracture- 09/20 mechanical fall, CT chest revealed right 9th rib fracture/compression fractures to T3/T7/T11, she had increased anxiety/pain after event, unable to complete ADLs or ambulate, moved to [facility] 09/25/2022, pain improved today Dementia- 04/2021 CT head noted chronic vascular changes, MMSE 16/30, no behavioral outbursts, see above, remains on Depakote (started  09/29), ativan, Zyprexa and Sertaline HTN- BUN/creat 16/0.62 09/11/2022, remains on amlodipine, HCTZ, losartan, and metoprolol Grade I diastolic dysfunction- 66/0630 echo LVEF 65-70% PAF- HR controlled with metoprolol, remains on asa for clot prevention CAD- hx demand ischemia, stent placement 1992 & 2005, remains on asa, metoprolol and statin HLD- LDL 27 12/2021, remains on statin Anemia- ED visit 02/17-02/18- hgb 6.1- given 2 units PRBC/ + hemoccult, hgb 9.1 02/16/2022, hgb 14.5 09/11/2022, denies black tarry stools or BRBPR, remains on ferrous sulfate and Protonix Depression- no mood changes, Na+ 135 09/11/2022, remains on sertraline  Recent blood pressures:  10/18- 146/76  10/17- 114/69  10/16- 90/56  Recent weights:  10/03- 126.4 lbs  09/04- 126.4 lbs  08/02- 125.2 lbs       Past Medical History:  Diagnosis Date   Anxiety and depression    Aortic stenosis    a. 03/2014: Mild aortic stenosis by valve area and mean gradient though appeared more visually consistent with moderate AS.    CAD (coronary artery disease)    a. s/p prior PCI of D2 in 1992 b. stent to RCA in 2005   Diverticula of colon    GERD (gastroesophageal reflux disease)    HTN (hypertension)    Hypercholesteremia    Lateral myocardial infarction [facility]) 1992   Osteoarthritis    Osteopenia    Scoliosis    Past Surgical History:  Procedure Laterality Date   ABDOMINAL HYSTERECTOMY     APPENDECTOMY     CORONARY ANGIOPLASTY     TONSILLECTOMY      Allergies  Allergen Reactions   Latex Other (See Comments)    Unknown  reaction - listed on [facility] 02/10/22   Nifedipine Other (See Comments)    Dark Urine; can only take orange tablet, allergic to yellow-brown tablet   Tetanus Toxoids Other (See Comments)    Unknown reaction   Tetanus-Diphtheria Toxoids Td Other (See Comments)    Unknown reaction   Adhesive [Tape] Rash    Outpatient Encounter Medications as of 10/16/2022  Medication Sig   acetaminophen  (TYLENOL) 500 MG tablet Take 500 mg by mouth in the morning, at noon, and at bedtime.   amLODipine (NORVASC) 5 MG tablet Take 5 mg by mouth daily.   atorvastatin (LIPITOR) 10 MG tablet Take 10 mg by mouth at bedtime.   cetirizine (ZYRTEC) 10 MG tablet Take 10 mg by mouth every morning.   cholecalciferol (VITAMIN D) 25 MCG (1000 UNIT) tablet Take 2,000 Units by mouth every evening.   divalproex (DEPAKOTE SPRINKLE) 125 MG capsule Take 125 mg by mouth at bedtime.   ferrous sulfate 325 (65 FE) MG tablet Take 325 mg by mouth daily with breakfast.   folic acid (FOLVITE) 1 MG tablet Take 1 mg by mouth daily.   hydrochlorothiazide (HYDRODIURIL) 25 MG tablet Take 25 mg by mouth in the morning.   LORazepam (ATIVAN) 0.5 MG tablet Take 0.5 mg by mouth every 8 (eight) hours.   losartan (COZAAR) 100 MG tablet Take 100 mg by mouth at bedtime.   methocarbamol (ROBAXIN) 500 MG tablet Take 250 mg by mouth every 6 (six) hours as needed for muscle spasms.   metoprolol succinate (TOPROL-XL) 50 MG 24 hr tablet Take 50 mg by mouth every evening. Take with or immediately following a meal.   nitroGLYCERIN (NITROSTAT) 0.4 MG SL tablet Place 0.4 mg under the tongue every 5 (five) minutes as needed for chest pain. As needed fo chest pain   OLANZapine (ZYPREXA) 2.5 MG tablet Take 5 mg by mouth in the morning and at bedtime.   oxyCODONE-acetaminophen (PERCOCET/ROXICET) 5-325 MG tablet Take 1 tablet by mouth 2 (two) times daily.   pantoprazole (PROTONIX) 40 MG tablet Take 40 mg by mouth daily.   polyethylene glycol powder (MIRALAX) 17 GM/SCOOP powder Take 1 Container by mouth once.   potassium chloride (KLOR-CON M) 10 MEQ tablet Take 10 mEq by mouth daily.   senna (SENOKOT) 8.6 MG TABS tablet Take 1 tablet by mouth in the morning.   sertraline (ZOLOFT) 25 MG tablet Take 50 mg by mouth at bedtime.   No facility-administered encounter medications on file as of 10/16/2022.    Review of Systems  Unable to perform ROS:  Dementia    Immunization History  Administered Date(s) Administered   Influenza Split 05/13/2009, 05/18/2010, 09/29/2011, 09/05/2012, 09/03/2013   Influenza, High Dose Seasonal PF 09/09/2015, 08/30/2017   Influenza, Quadrivalent, Recombinant, Inj, Pf 08/19/2018, 09/29/2019   Influenza-Unspecified 10/18/2021   Moderna Covid-19 Vaccine Bivalent Booster 64yr & up 09/14/2021, 06/07/2022   Moderna SARS-COV2 Booster Vaccination 11/08/2020, 06/01/2021   Moderna Sars-Covid-2 Vaccination 12/29/2019, 01/26/2020   Pfizer Covid-19 Vaccine Bivalent Booster 193yr& up 09/14/2020   Pneumococcal Conjugate-13 07/08/2013   Pneumococcal Polysaccharide-23 06/29/2006, 05/13/2009, 05/18/2010, 05/25/2011   Td 05/13/2009, 05/18/2010, 05/25/2011   Tdap 03/20/2022   Zoster Recombinat (Shingrix) 08/24/2017, 10/31/2017   Zoster, Live 08/22/2008, 08/24/2008, 05/13/2009, 05/18/2010, 05/25/2011   Pertinent  Health Maintenance Due  Topic Date Due   INFLUENZA VACCINE  07/25/2022   DEXA SCAN  Completed      02/11/2022    8:00 AM 03/20/2022   10:22 AM 09/07/2022  12:11 PM 09/08/2022    3:52 AM 09/09/2022   10:24 PM  Fall Risk  Falls in the past year?  1 0    Was there an injury with Fall?  0 0    Fall Risk Category Calculator  1 0    Fall Risk Category  Low Low    Patient Fall Risk Level Moderate fall risk Moderate fall risk Low fall risk Moderate fall risk High fall risk  Patient at Risk for Falls Due to  History of fall(s);Impaired balance/gait;Mental status change;Other (Comment) No Fall Risks    Patient at Risk for Falls Due to - Comments  vascular dementia     Fall risk Follow up  Falls evaluation completed;Education provided;Falls prevention discussed Falls evaluation completed     Functional Status Survey:    Vitals:   10/16/22 1215  BP: (!) 146/76  Pulse: 78  Resp: 20  Temp: 98 F (36.7 C)  SpO2: 93%  Weight: 120 lb 3.2 oz (54.5 kg)  Height: 5' (1.524 m)   Body mass index is 23.47  kg/m. Physical Exam Vitals reviewed.  Constitutional:      General: She is not in acute distress. HENT:     Head: Normocephalic.     Right Ear: There is no impacted cerumen.     Left Ear: There is no impacted cerumen.     Nose: Nose normal.     Mouth/Throat:     Mouth: Mucous membranes are moist.  Eyes:     General:        Right eye: No discharge.        Left eye: No discharge.  Cardiovascular:     Rate and Rhythm: Normal rate. Rhythm irregular.     Pulses: Normal pulses.     Heart sounds: Murmur heard.  Pulmonary:     Effort: Pulmonary effort is normal. No respiratory distress.     Breath sounds: Normal breath sounds. No wheezing.  Abdominal:     General: Bowel sounds are normal. There is no distension.     Palpations: Abdomen is soft.     Tenderness: There is no abdominal tenderness.  Musculoskeletal:     Cervical back: Neck supple.     Right lower leg: No edema.     Left lower leg: No edema.  Skin:    General: Skin is warm and dry.     Capillary Refill: Capillary refill takes less than 2 seconds.     Findings: Lesion present.     Comments: Skin tear to left anterior forearm, approx 1-2 cm, CDI, scab intact, surrounding skin intact. 2 skin tears to LLE, approx 1-2 cm, steri strips intact, CDI, surrounding skin intact.   Neurological:     General: No focal deficit present.     Mental Status: She is alert. Mental status is at baseline.     Motor: Weakness present.     Gait: Gait abnormal.     Comments: wheelchair  Psychiatric:        Mood and Affect: Mood normal.        Behavior: Behavior normal.     Comments: Very pleasant, follows commands, alert to self/person     Labs reviewed: Recent Labs    02/11/22 0108 06/05/22 0000 09/08/22 0422 09/11/22 0730  NA 138 142 134* 135  K 3.0* 3.9 3.2* 3.4*  CL 106 107 101 96*  CO2 24 29* 25 29  GLUCOSE 110*  --  106* 67  BUN 11 16  10 16  CREATININE 0.64 0.5 0.38* 0.62  CALCIUM 8.3* 8.7 8.9 9.0   Recent Labs     11/24/21 0000 02/09/22 0000 02/10/22 1303 09/11/22 0730  AST '16 23 28 18  '$ ALT '12 22 25 11  '$ ALKPHOS 106 90 97  --   BILITOT  --   --  0.5 0.6  PROT  --   --  6.3* 6.6  ALBUMIN 3.2* 3.7 3.2*  --    Recent Labs    02/11/22 0108 02/20/22 0000 03/09/22 0000 06/05/22 0000 09/08/22 0422 09/11/22 0730  WBC 5.8   < > 3.7 4.2 6.7 5.5  NEUTROABS 4.1   < > 1,550.00  --  5.0 3,801  HGB 8.0*   < > 10.7* 12.5 14.1 14.5  HCT 26.6*   < > 36 39 43.2 42.1  MCV 80.4  --   --   --  95.6 89.6  PLT 216   < > 266 209 220 236   < > = values in this interval not displayed.   Lab Results  Component Value Date   TSH 2.90 11/24/2021   No results found for: "HGBA1C" Lab Results  Component Value Date   CHOL 86 01/23/2022   HDL 43 01/23/2022   LDLCALC 27 01/23/2022   TRIG 77 01/23/2022   CHOLHDL 1.9 09/17/2020    Significant Diagnostic Results in last 30 days:  No results found.  Assessment/Plan 1. Skin tear of upper arm without complication, left, initial encounter - Approx 1-2 cm skin tear to left anterior forearm, no sign of infection - cont daily dressing changes- cleanse with saline and cover with non adherent dressing  2. Skin tear of lower leg without complication, left, initial encounter - 2 tears to LLE, reinforced with steri strips, no sign of infection - cont daily dressing changes- cleanse with saline and cover with non adherent dressing  3. Compression fracture of body of thoracic vertebra (HCC) - pain improved  - will wean percocet to 1/2 tablet BID, then BID prn - cont tylenol, lidocaine patch and robaxin   4. Compression fracture of L1 vertebra with routine healing, subsequent encounter - see above  5. Moderate late onset Alzheimer's dementia with other behavioral disturbance ([place]) - adjusting well in SNF - Depakote started 09/29 - cont Zyprexa, ativan and sertraline - hepatic/valproic acid level 10/19/2022  6. Primary hypertension - controlled - cont HCTZ,  amlodipine and metoprolol  7. Grade I diastolic dysfunction - no further workup  8. PAF (paroxysmal atrial fibrillation) (HCC) - HR controlled with metoprolol - off anticoagulation due to falls  9. Coronary artery disease due to lipid rich plaque - cont statin  10. Hypercholesteremia - cont statin  11. Iron deficiency anemia, unspecified iron deficiency anemia type - hgb stable - cont ferrous sulfate and folic acid  12. Anxiety and depression - cont Zoloft and ativan prn  13. Frequent falls - cont PT - cont skilled nursing care   Family/ staff Communication: plan discussed with patient and nurse  Labs/tests ordered:  hepatic panel/ valproic acid 10/19/2022

## 2022-10-17 DIAGNOSIS — I4891 Unspecified atrial fibrillation: Secondary | ICD-10-CM | POA: Diagnosis not present

## 2022-10-17 DIAGNOSIS — M6281 Muscle weakness (generalized): Secondary | ICD-10-CM | POA: Diagnosis not present

## 2022-10-17 DIAGNOSIS — R4189 Other symptoms and signs involving cognitive functions and awareness: Secondary | ICD-10-CM | POA: Diagnosis not present

## 2022-10-17 DIAGNOSIS — R41841 Cognitive communication deficit: Secondary | ICD-10-CM | POA: Diagnosis not present

## 2022-10-17 DIAGNOSIS — M545 Low back pain, unspecified: Secondary | ICD-10-CM | POA: Diagnosis not present

## 2022-10-17 DIAGNOSIS — Z9181 History of falling: Secondary | ICD-10-CM | POA: Diagnosis not present

## 2022-10-18 DIAGNOSIS — M545 Low back pain, unspecified: Secondary | ICD-10-CM | POA: Diagnosis not present

## 2022-10-18 DIAGNOSIS — M6281 Muscle weakness (generalized): Secondary | ICD-10-CM | POA: Diagnosis not present

## 2022-10-18 DIAGNOSIS — Z9181 History of falling: Secondary | ICD-10-CM | POA: Diagnosis not present

## 2022-10-18 DIAGNOSIS — R41841 Cognitive communication deficit: Secondary | ICD-10-CM | POA: Diagnosis not present

## 2022-10-18 DIAGNOSIS — R4189 Other symptoms and signs involving cognitive functions and awareness: Secondary | ICD-10-CM | POA: Diagnosis not present

## 2022-10-18 DIAGNOSIS — I4891 Unspecified atrial fibrillation: Secondary | ICD-10-CM | POA: Diagnosis not present

## 2022-10-19 DIAGNOSIS — Z9181 History of falling: Secondary | ICD-10-CM | POA: Diagnosis not present

## 2022-10-19 DIAGNOSIS — R41841 Cognitive communication deficit: Secondary | ICD-10-CM | POA: Diagnosis not present

## 2022-10-19 DIAGNOSIS — G301 Alzheimer's disease with late onset: Secondary | ICD-10-CM | POA: Diagnosis not present

## 2022-10-19 DIAGNOSIS — I4891 Unspecified atrial fibrillation: Secondary | ICD-10-CM | POA: Diagnosis not present

## 2022-10-19 DIAGNOSIS — M545 Low back pain, unspecified: Secondary | ICD-10-CM | POA: Diagnosis not present

## 2022-10-19 DIAGNOSIS — M6281 Muscle weakness (generalized): Secondary | ICD-10-CM | POA: Diagnosis not present

## 2022-10-19 DIAGNOSIS — R4189 Other symptoms and signs involving cognitive functions and awareness: Secondary | ICD-10-CM | POA: Diagnosis not present

## 2022-10-19 DIAGNOSIS — F02B18 Dementia in other diseases classified elsewhere, moderate, with other behavioral disturbance: Secondary | ICD-10-CM | POA: Diagnosis not present

## 2022-10-19 DIAGNOSIS — I10 Essential (primary) hypertension: Secondary | ICD-10-CM | POA: Diagnosis not present

## 2022-10-20 DIAGNOSIS — R41841 Cognitive communication deficit: Secondary | ICD-10-CM | POA: Diagnosis not present

## 2022-10-20 DIAGNOSIS — R4189 Other symptoms and signs involving cognitive functions and awareness: Secondary | ICD-10-CM | POA: Diagnosis not present

## 2022-10-20 DIAGNOSIS — M6281 Muscle weakness (generalized): Secondary | ICD-10-CM | POA: Diagnosis not present

## 2022-10-20 DIAGNOSIS — I4891 Unspecified atrial fibrillation: Secondary | ICD-10-CM | POA: Diagnosis not present

## 2022-10-20 DIAGNOSIS — M545 Low back pain, unspecified: Secondary | ICD-10-CM | POA: Diagnosis not present

## 2022-10-20 DIAGNOSIS — Z9181 History of falling: Secondary | ICD-10-CM | POA: Diagnosis not present

## 2022-10-23 DIAGNOSIS — M545 Low back pain, unspecified: Secondary | ICD-10-CM | POA: Diagnosis not present

## 2022-10-23 DIAGNOSIS — I4891 Unspecified atrial fibrillation: Secondary | ICD-10-CM | POA: Diagnosis not present

## 2022-10-23 DIAGNOSIS — M6281 Muscle weakness (generalized): Secondary | ICD-10-CM | POA: Diagnosis not present

## 2022-10-23 DIAGNOSIS — R4189 Other symptoms and signs involving cognitive functions and awareness: Secondary | ICD-10-CM | POA: Diagnosis not present

## 2022-10-23 DIAGNOSIS — R41841 Cognitive communication deficit: Secondary | ICD-10-CM | POA: Diagnosis not present

## 2022-10-23 DIAGNOSIS — Z9181 History of falling: Secondary | ICD-10-CM | POA: Diagnosis not present

## 2022-10-24 DIAGNOSIS — R4189 Other symptoms and signs involving cognitive functions and awareness: Secondary | ICD-10-CM | POA: Diagnosis not present

## 2022-10-24 DIAGNOSIS — M545 Low back pain, unspecified: Secondary | ICD-10-CM | POA: Diagnosis not present

## 2022-10-24 DIAGNOSIS — M6281 Muscle weakness (generalized): Secondary | ICD-10-CM | POA: Diagnosis not present

## 2022-10-24 DIAGNOSIS — Z9181 History of falling: Secondary | ICD-10-CM | POA: Diagnosis not present

## 2022-10-24 DIAGNOSIS — R41841 Cognitive communication deficit: Secondary | ICD-10-CM | POA: Diagnosis not present

## 2022-10-24 DIAGNOSIS — I4891 Unspecified atrial fibrillation: Secondary | ICD-10-CM | POA: Diagnosis not present

## 2022-10-25 DIAGNOSIS — M6281 Muscle weakness (generalized): Secondary | ICD-10-CM | POA: Diagnosis not present

## 2022-10-25 DIAGNOSIS — R2681 Unsteadiness on feet: Secondary | ICD-10-CM | POA: Diagnosis not present

## 2022-10-25 DIAGNOSIS — I4891 Unspecified atrial fibrillation: Secondary | ICD-10-CM | POA: Diagnosis not present

## 2022-10-25 DIAGNOSIS — R41841 Cognitive communication deficit: Secondary | ICD-10-CM | POA: Diagnosis not present

## 2022-10-25 DIAGNOSIS — R4189 Other symptoms and signs involving cognitive functions and awareness: Secondary | ICD-10-CM | POA: Diagnosis not present

## 2022-10-25 DIAGNOSIS — Z9181 History of falling: Secondary | ICD-10-CM | POA: Diagnosis not present

## 2022-10-25 DIAGNOSIS — M545 Low back pain, unspecified: Secondary | ICD-10-CM | POA: Diagnosis not present

## 2022-10-26 DIAGNOSIS — R41841 Cognitive communication deficit: Secondary | ICD-10-CM | POA: Diagnosis not present

## 2022-10-26 DIAGNOSIS — M6281 Muscle weakness (generalized): Secondary | ICD-10-CM | POA: Diagnosis not present

## 2022-10-26 DIAGNOSIS — M545 Low back pain, unspecified: Secondary | ICD-10-CM | POA: Diagnosis not present

## 2022-10-26 DIAGNOSIS — Z9181 History of falling: Secondary | ICD-10-CM | POA: Diagnosis not present

## 2022-10-26 DIAGNOSIS — I4891 Unspecified atrial fibrillation: Secondary | ICD-10-CM | POA: Diagnosis not present

## 2022-10-26 DIAGNOSIS — R4189 Other symptoms and signs involving cognitive functions and awareness: Secondary | ICD-10-CM | POA: Diagnosis not present

## 2022-10-27 DIAGNOSIS — R41841 Cognitive communication deficit: Secondary | ICD-10-CM | POA: Diagnosis not present

## 2022-10-27 DIAGNOSIS — I4891 Unspecified atrial fibrillation: Secondary | ICD-10-CM | POA: Diagnosis not present

## 2022-10-27 DIAGNOSIS — M545 Low back pain, unspecified: Secondary | ICD-10-CM | POA: Diagnosis not present

## 2022-10-27 DIAGNOSIS — R4189 Other symptoms and signs involving cognitive functions and awareness: Secondary | ICD-10-CM | POA: Diagnosis not present

## 2022-10-27 DIAGNOSIS — M6281 Muscle weakness (generalized): Secondary | ICD-10-CM | POA: Diagnosis not present

## 2022-10-27 DIAGNOSIS — Z9181 History of falling: Secondary | ICD-10-CM | POA: Diagnosis not present

## 2022-10-30 DIAGNOSIS — I4891 Unspecified atrial fibrillation: Secondary | ICD-10-CM | POA: Diagnosis not present

## 2022-10-30 DIAGNOSIS — M545 Low back pain, unspecified: Secondary | ICD-10-CM | POA: Diagnosis not present

## 2022-10-30 DIAGNOSIS — R41841 Cognitive communication deficit: Secondary | ICD-10-CM | POA: Diagnosis not present

## 2022-10-30 DIAGNOSIS — R4189 Other symptoms and signs involving cognitive functions and awareness: Secondary | ICD-10-CM | POA: Diagnosis not present

## 2022-10-30 DIAGNOSIS — M6281 Muscle weakness (generalized): Secondary | ICD-10-CM | POA: Diagnosis not present

## 2022-10-30 DIAGNOSIS — Z9181 History of falling: Secondary | ICD-10-CM | POA: Diagnosis not present

## 2022-10-31 DIAGNOSIS — Z23 Encounter for immunization: Secondary | ICD-10-CM | POA: Diagnosis not present

## 2022-10-31 DIAGNOSIS — Z9181 History of falling: Secondary | ICD-10-CM | POA: Diagnosis not present

## 2022-10-31 DIAGNOSIS — R4189 Other symptoms and signs involving cognitive functions and awareness: Secondary | ICD-10-CM | POA: Diagnosis not present

## 2022-10-31 DIAGNOSIS — M545 Low back pain, unspecified: Secondary | ICD-10-CM | POA: Diagnosis not present

## 2022-10-31 DIAGNOSIS — I4891 Unspecified atrial fibrillation: Secondary | ICD-10-CM | POA: Diagnosis not present

## 2022-10-31 DIAGNOSIS — R41841 Cognitive communication deficit: Secondary | ICD-10-CM | POA: Diagnosis not present

## 2022-10-31 DIAGNOSIS — M6281 Muscle weakness (generalized): Secondary | ICD-10-CM | POA: Diagnosis not present

## 2022-11-01 DIAGNOSIS — R41841 Cognitive communication deficit: Secondary | ICD-10-CM | POA: Diagnosis not present

## 2022-11-01 DIAGNOSIS — R4189 Other symptoms and signs involving cognitive functions and awareness: Secondary | ICD-10-CM | POA: Diagnosis not present

## 2022-11-01 DIAGNOSIS — M545 Low back pain, unspecified: Secondary | ICD-10-CM | POA: Diagnosis not present

## 2022-11-01 DIAGNOSIS — Z9181 History of falling: Secondary | ICD-10-CM | POA: Diagnosis not present

## 2022-11-01 DIAGNOSIS — M6281 Muscle weakness (generalized): Secondary | ICD-10-CM | POA: Diagnosis not present

## 2022-11-01 DIAGNOSIS — I4891 Unspecified atrial fibrillation: Secondary | ICD-10-CM | POA: Diagnosis not present

## 2022-11-02 DIAGNOSIS — R4189 Other symptoms and signs involving cognitive functions and awareness: Secondary | ICD-10-CM | POA: Diagnosis not present

## 2022-11-02 DIAGNOSIS — G2581 Restless legs syndrome: Secondary | ICD-10-CM | POA: Diagnosis not present

## 2022-11-02 DIAGNOSIS — E785 Hyperlipidemia, unspecified: Secondary | ICD-10-CM | POA: Diagnosis not present

## 2022-11-02 DIAGNOSIS — I10 Essential (primary) hypertension: Secondary | ICD-10-CM | POA: Diagnosis not present

## 2022-11-02 DIAGNOSIS — F03918 Unspecified dementia, unspecified severity, with other behavioral disturbance: Secondary | ICD-10-CM | POA: Diagnosis not present

## 2022-11-02 DIAGNOSIS — Z9181 History of falling: Secondary | ICD-10-CM | POA: Diagnosis not present

## 2022-11-02 DIAGNOSIS — R41841 Cognitive communication deficit: Secondary | ICD-10-CM | POA: Diagnosis not present

## 2022-11-02 DIAGNOSIS — I4891 Unspecified atrial fibrillation: Secondary | ICD-10-CM | POA: Diagnosis not present

## 2022-11-02 DIAGNOSIS — I209 Angina pectoris, unspecified: Secondary | ICD-10-CM | POA: Diagnosis not present

## 2022-11-02 DIAGNOSIS — M545 Low back pain, unspecified: Secondary | ICD-10-CM | POA: Diagnosis not present

## 2022-11-02 DIAGNOSIS — K219 Gastro-esophageal reflux disease without esophagitis: Secondary | ICD-10-CM | POA: Diagnosis not present

## 2022-11-02 DIAGNOSIS — D509 Iron deficiency anemia, unspecified: Secondary | ICD-10-CM | POA: Diagnosis not present

## 2022-11-02 DIAGNOSIS — M6281 Muscle weakness (generalized): Secondary | ICD-10-CM | POA: Diagnosis not present

## 2022-11-02 LAB — COMPREHENSIVE METABOLIC PANEL
Albumin: 3.6 (ref 3.5–5.0)
Calcium: 8.7 (ref 8.7–10.7)
Globulin: 2.9
eGFR: 92

## 2022-11-02 LAB — BASIC METABOLIC PANEL
BUN: 11 (ref 4–21)
CO2: 31 — AB (ref 13–22)
Chloride: 104 (ref 99–108)
Creatinine: 0.4 — AB (ref 0.5–1.1)
Glucose: 68
Potassium: 3.8 mEq/L (ref 3.5–5.1)
Sodium: 141 (ref 137–147)

## 2022-11-02 LAB — HEPATIC FUNCTION PANEL
ALT: 10 U/L (ref 7–35)
AST: 16 (ref 13–35)
Alkaline Phosphatase: 128 — AB (ref 25–125)
Bilirubin, Total: 0.5

## 2022-11-02 LAB — CBC AND DIFFERENTIAL
HCT: 41 (ref 36–46)
Hemoglobin: 13.4 (ref 12.0–16.0)
Platelets: 209 10*3/uL (ref 150–400)
WBC: 4.3

## 2022-11-02 LAB — CBC: RBC: 4.38 (ref 3.87–5.11)

## 2022-11-03 DIAGNOSIS — R4189 Other symptoms and signs involving cognitive functions and awareness: Secondary | ICD-10-CM | POA: Diagnosis not present

## 2022-11-03 DIAGNOSIS — Z9181 History of falling: Secondary | ICD-10-CM | POA: Diagnosis not present

## 2022-11-03 DIAGNOSIS — M6281 Muscle weakness (generalized): Secondary | ICD-10-CM | POA: Diagnosis not present

## 2022-11-03 DIAGNOSIS — R41841 Cognitive communication deficit: Secondary | ICD-10-CM | POA: Diagnosis not present

## 2022-11-03 DIAGNOSIS — M545 Low back pain, unspecified: Secondary | ICD-10-CM | POA: Diagnosis not present

## 2022-11-03 DIAGNOSIS — I4891 Unspecified atrial fibrillation: Secondary | ICD-10-CM | POA: Diagnosis not present

## 2022-11-06 DIAGNOSIS — M6281 Muscle weakness (generalized): Secondary | ICD-10-CM | POA: Diagnosis not present

## 2022-11-06 DIAGNOSIS — R4189 Other symptoms and signs involving cognitive functions and awareness: Secondary | ICD-10-CM | POA: Diagnosis not present

## 2022-11-06 DIAGNOSIS — R41841 Cognitive communication deficit: Secondary | ICD-10-CM | POA: Diagnosis not present

## 2022-11-06 DIAGNOSIS — I4891 Unspecified atrial fibrillation: Secondary | ICD-10-CM | POA: Diagnosis not present

## 2022-11-06 DIAGNOSIS — Z9181 History of falling: Secondary | ICD-10-CM | POA: Diagnosis not present

## 2022-11-06 DIAGNOSIS — M545 Low back pain, unspecified: Secondary | ICD-10-CM | POA: Diagnosis not present

## 2022-11-07 DIAGNOSIS — R4189 Other symptoms and signs involving cognitive functions and awareness: Secondary | ICD-10-CM | POA: Diagnosis not present

## 2022-11-07 DIAGNOSIS — Z9181 History of falling: Secondary | ICD-10-CM | POA: Diagnosis not present

## 2022-11-07 DIAGNOSIS — R41841 Cognitive communication deficit: Secondary | ICD-10-CM | POA: Diagnosis not present

## 2022-11-07 DIAGNOSIS — M545 Low back pain, unspecified: Secondary | ICD-10-CM | POA: Diagnosis not present

## 2022-11-07 DIAGNOSIS — I4891 Unspecified atrial fibrillation: Secondary | ICD-10-CM | POA: Diagnosis not present

## 2022-11-07 DIAGNOSIS — M6281 Muscle weakness (generalized): Secondary | ICD-10-CM | POA: Diagnosis not present

## 2022-11-08 DIAGNOSIS — M6281 Muscle weakness (generalized): Secondary | ICD-10-CM | POA: Diagnosis not present

## 2022-11-08 DIAGNOSIS — Z9181 History of falling: Secondary | ICD-10-CM | POA: Diagnosis not present

## 2022-11-08 DIAGNOSIS — M545 Low back pain, unspecified: Secondary | ICD-10-CM | POA: Diagnosis not present

## 2022-11-08 DIAGNOSIS — R41841 Cognitive communication deficit: Secondary | ICD-10-CM | POA: Diagnosis not present

## 2022-11-08 DIAGNOSIS — I4891 Unspecified atrial fibrillation: Secondary | ICD-10-CM | POA: Diagnosis not present

## 2022-11-08 DIAGNOSIS — R4189 Other symptoms and signs involving cognitive functions and awareness: Secondary | ICD-10-CM | POA: Diagnosis not present

## 2022-11-09 DIAGNOSIS — M6281 Muscle weakness (generalized): Secondary | ICD-10-CM | POA: Diagnosis not present

## 2022-11-09 DIAGNOSIS — Z9181 History of falling: Secondary | ICD-10-CM | POA: Diagnosis not present

## 2022-11-09 DIAGNOSIS — R4189 Other symptoms and signs involving cognitive functions and awareness: Secondary | ICD-10-CM | POA: Diagnosis not present

## 2022-11-09 DIAGNOSIS — I4891 Unspecified atrial fibrillation: Secondary | ICD-10-CM | POA: Diagnosis not present

## 2022-11-09 DIAGNOSIS — M545 Low back pain, unspecified: Secondary | ICD-10-CM | POA: Diagnosis not present

## 2022-11-09 DIAGNOSIS — R41841 Cognitive communication deficit: Secondary | ICD-10-CM | POA: Diagnosis not present

## 2022-11-10 ENCOUNTER — Non-Acute Institutional Stay (SKILLED_NURSING_FACILITY): Payer: Medicare Other | Admitting: Orthopedic Surgery

## 2022-11-10 ENCOUNTER — Encounter: Payer: Self-pay | Admitting: Orthopedic Surgery

## 2022-11-10 DIAGNOSIS — S22000A Wedge compression fracture of unspecified thoracic vertebra, initial encounter for closed fracture: Secondary | ICD-10-CM | POA: Diagnosis not present

## 2022-11-10 DIAGNOSIS — R41841 Cognitive communication deficit: Secondary | ICD-10-CM | POA: Diagnosis not present

## 2022-11-10 DIAGNOSIS — G301 Alzheimer's disease with late onset: Secondary | ICD-10-CM | POA: Diagnosis not present

## 2022-11-10 DIAGNOSIS — I480 Paroxysmal atrial fibrillation: Secondary | ICD-10-CM

## 2022-11-10 DIAGNOSIS — F419 Anxiety disorder, unspecified: Secondary | ICD-10-CM

## 2022-11-10 DIAGNOSIS — I10 Essential (primary) hypertension: Secondary | ICD-10-CM

## 2022-11-10 DIAGNOSIS — F32A Depression, unspecified: Secondary | ICD-10-CM

## 2022-11-10 DIAGNOSIS — Z9181 History of falling: Secondary | ICD-10-CM | POA: Diagnosis not present

## 2022-11-10 DIAGNOSIS — D509 Iron deficiency anemia, unspecified: Secondary | ICD-10-CM | POA: Diagnosis not present

## 2022-11-10 DIAGNOSIS — I5189 Other ill-defined heart diseases: Secondary | ICD-10-CM | POA: Diagnosis not present

## 2022-11-10 DIAGNOSIS — M545 Low back pain, unspecified: Secondary | ICD-10-CM | POA: Diagnosis not present

## 2022-11-10 DIAGNOSIS — I2510 Atherosclerotic heart disease of native coronary artery without angina pectoris: Secondary | ICD-10-CM | POA: Diagnosis not present

## 2022-11-10 DIAGNOSIS — M6281 Muscle weakness (generalized): Secondary | ICD-10-CM | POA: Diagnosis not present

## 2022-11-10 DIAGNOSIS — S81812D Laceration without foreign body, left lower leg, subsequent encounter: Secondary | ICD-10-CM

## 2022-11-10 DIAGNOSIS — F02B18 Dementia in other diseases classified elsewhere, moderate, with other behavioral disturbance: Secondary | ICD-10-CM | POA: Diagnosis not present

## 2022-11-10 DIAGNOSIS — E7800 Pure hypercholesterolemia, unspecified: Secondary | ICD-10-CM | POA: Diagnosis not present

## 2022-11-10 DIAGNOSIS — I2583 Coronary atherosclerosis due to lipid rich plaque: Secondary | ICD-10-CM

## 2022-11-10 DIAGNOSIS — I4891 Unspecified atrial fibrillation: Secondary | ICD-10-CM | POA: Diagnosis not present

## 2022-11-10 DIAGNOSIS — S41112D Laceration without foreign body of left upper arm, subsequent encounter: Secondary | ICD-10-CM

## 2022-11-10 DIAGNOSIS — R4189 Other symptoms and signs involving cognitive functions and awareness: Secondary | ICD-10-CM | POA: Diagnosis not present

## 2022-11-10 NOTE — Progress Notes (Signed)
Location:   [place] Room Number: 30 Place of Service:  SNF [phone]) Provider:  Windell Moulding, NP  [name], MD  Patient Care Team: [name], MD as PCP - General (Internal Medicine) Martinique, Peter M, MD as PCP - Cardiology (Cardiology)  Extended Emergency Contact Information Primary Emergency Contact: [name],[name] Address: [address]          [place] [address] [name] of [place] Phone: [phone] Mobile Phone: [phone] Relation: Sister Secondary Emergency Contact: [name],[name]  [name] of [place] Phone: [phone] Mobile Phone: [phone] Relation: Niece  Code Status:  DNR Goals of care: Advanced Directive information    11/10/2022   12:34 PM  Advanced Directives  Type of Advance Directive Healthcare Power of [place];Out of facility DNR (pink MOST or yellow form)  Copy of [place] in Chart? Yes - validated most recent copy scanned in chart (See row information)  Pre-existing out of facility DNR order (yellow form or pink MOST form) Pink MOST form placed in chart (order not valid for inpatient use)     Chief Complaint  Patient presents with   Medical Management of Chronic Issues    Routine follow up   Immunizations    Flu vaccine due    HPI:  Pt is a 88 y.o. female seen today for medical management of chronic conditions.   She currently resides on the skilled living unit at [facility]. PMH: aortic stenosis,CAD, HTN, HLD, atrial fib, emphysema, GERD, vascular dementia, and depression.    Dementia- 04/2021 CT head noted chronic vascular changes, MMSE 16/30, no behavioral outbursts, adjusted well in SNF, remains on Depakote, ativan, Zyprexa and Sertraline Compression fracture- 09/20 mechanical fall, CT chest revealed right 9th rib fracture/compression fractures to T3/T7/T11, moved to SNF 09/25/2022, now ambulates with wheelchair HTN- BUN/creat 11/0.40 09/25/2022,  remains on amlodipine, HCTZ, losartan, and metoprolol Grade I diastolic dysfunction- 79/8921 echo LVEF 65-70% PAF- HR controlled with metoprolol, remains on asa for clot prevention CAD- hx demand ischemia, stent placement 1992 & 2005, remains on asa, metoprolol and statin HLD- LDL 27 12/2021, remains on statin Anemia- H/o + hemoccults 01/2022, hgb 14.5 09/11/2022, remains on ferrous sulfate and Protonix Depression- no mood changes, Na+ 140 09/25/2022, remains on sertraline Skin tears- noted last month, slow healing, daily dressing changes per wound nurse  No recent falls or injuries.   Recent blood pressures:  11/03- 148/80  10/27- 150/82  10/18- 146/76  Recent weights:  11/01- not recorded  10/03- 126.4 lbs  09/04- 126.4 lbs     Past Medical History:  Diagnosis Date   Anxiety and depression    Aortic stenosis    a. 03/2014: Mild aortic stenosis by valve area and mean gradient though appeared more visually consistent with moderate AS.    CAD (coronary artery disease)    a. s/p prior PCI of D2 in 1992 b. stent to RCA in 2005   Diverticula of colon    GERD (gastroesophageal reflux disease)    HTN (hypertension)    Hypercholesteremia    Lateral myocardial infarction [facility]) 1992   Osteoarthritis    Osteopenia    Scoliosis    Past Surgical History:  Procedure Laterality Date   ABDOMINAL HYSTERECTOMY     APPENDECTOMY     CORONARY ANGIOPLASTY     TONSILLECTOMY      Allergies  Allergen Reactions   Latex Other (See Comments)    Unknown reaction - listed  on [facility] 02/10/22   Nifedipine Other (See Comments)    Dark Urine; can only take orange tablet, allergic to yellow-brown tablet   Tetanus Toxoids Other (See Comments)    Unknown reaction   Tetanus-Diphtheria Toxoids Td Other (See Comments)    Unknown reaction   Adhesive [Tape] Rash    Allergies as of 11/10/2022       Reactions   Latex Other (See Comments)   Unknown reaction - listed on [facility] 02/10/22   Nifedipine  Other (See Comments)   Dark Urine; can only take orange tablet, allergic to yellow-brown tablet   Tetanus Toxoids Other (See Comments)   Unknown reaction   Tetanus-diphtheria Toxoids Td Other (See Comments)   Unknown reaction   Adhesive [tape] Rash        Medication List        Accurate as of November 10, 2022 12:36 PM. If you have any questions, ask your nurse or doctor.          STOP taking these medications    lidocaine 4 % Stopped by: [name], NP   MiraLax 17 GM/SCOOP powder Generic drug: polyethylene glycol powder Stopped by: [name], NP       TAKE these medications    acetaminophen 500 MG tablet Commonly known as: TYLENOL Take 500 mg by mouth in the morning, at noon, and at bedtime.   amLODipine 5 MG tablet Commonly known as: NORVASC Take 5 mg by mouth daily.   atorvastatin 10 MG tablet Commonly known as: LIPITOR Take 10 mg by mouth at bedtime.   cetirizine 10 MG tablet Commonly known as: ZYRTEC Take 10 mg by mouth every morning.   cholecalciferol 25 MCG (1000 UNIT) tablet Commonly known as: VITAMIN D3 Take 2,000 Units by mouth every evening.   divalproex 125 MG capsule Commonly known as: DEPAKOTE SPRINKLE Take 125 mg by mouth at bedtime.   ferrous sulfate 325 (65 FE) MG tablet Take 325 mg by mouth daily with breakfast.   folic acid 1 MG tablet Commonly known as: FOLVITE Take 1 mg by mouth daily.   hydrochlorothiazide 25 MG tablet Commonly known as: HYDRODIURIL Take 25 mg by mouth in the morning.   LORazepam 0.5 MG tablet Commonly known as: ATIVAN Take 0.5 mg by mouth daily as needed.   LORazepam 0.5 MG tablet Commonly known as: ATIVAN Take 0.5 mg by mouth daily. For anxiety and agitation   losartan 100 MG tablet Commonly known as: COZAAR Take 100 mg by mouth at bedtime.   methocarbamol 500 MG tablet Commonly known as: ROBAXIN Take 250 mg by mouth every 6 (six) hours as needed for muscle spasms.   metoprolol succinate  50 MG 24 hr tablet Commonly known as: TOPROL-XL Take 50 mg by mouth every evening. Take with or immediately following a meal.   nitroGLYCERIN 0.4 MG SL tablet Commonly known as: NITROSTAT Place 0.4 mg under the tongue every 5 (five) minutes as needed for chest pain. As needed fo chest pain   OLANZapine 5 MG tablet Commonly known as: ZYPREXA Take 5 mg by mouth in the morning and at bedtime.   oxyCODONE-acetaminophen 5-325 MG tablet Commonly known as: PERCOCET/ROXICET Take by mouth every 12 (twelve) hours as needed for severe pain. 1/2 tablet   pantoprazole 40 MG tablet Commonly known as: PROTONIX Take 40 mg by mouth daily.   potassium chloride 10 MEQ tablet Commonly known as: KLOR-CON M Take 10 mEq by mouth daily.   senna 8.6  MG Tabs tablet Commonly known as: SENOKOT Take 1 tablet by mouth in the morning.   sertraline 25 MG tablet Commonly known as: ZOLOFT Take 50 mg by mouth at bedtime.        Review of Systems  Unable to perform ROS: Dementia    Immunization History  Administered Date(s) Administered   Influenza Split 05/13/2009, 05/18/2010, 09/29/2011, 09/05/2012, 09/03/2013   Influenza, High Dose Seasonal PF 09/09/2015, 08/30/2017   Influenza, Quadrivalent, Recombinant, Inj, Pf 08/19/2018, 09/29/2019   Influenza-Unspecified 10/18/2021, 10/18/2022   Moderna Covid-19 Vaccine Bivalent Booster 29yr & up 09/14/2021, 06/07/2022   Moderna SARS-COV2 Booster Vaccination 11/08/2020, 06/01/2021   Moderna Sars-Covid-2 Vaccination 12/29/2019, 01/26/2020   Pfizer Covid-19 Vaccine Bivalent Booster 140yr& up 09/14/2020   Pneumococcal Conjugate-13 07/08/2013   Pneumococcal Polysaccharide-23 06/29/2006, 05/13/2009, 05/18/2010, 05/25/2011   Td 05/13/2009, 05/18/2010, 05/25/2011   Tdap 03/20/2022   Zoster Recombinat (Shingrix) 08/24/2017, 10/31/2017   Zoster, Live 08/22/2008, 08/24/2008, 05/13/2009, 05/18/2010, 05/25/2011   Pertinent  Health Maintenance Due  Topic Date Due    INFLUENZA VACCINE  Completed   DEXA SCAN  Completed      02/11/2022    8:00 AM 03/20/2022   10:22 AM 09/07/2022   12:11 PM 09/08/2022    3:52 AM 09/09/2022   10:24 PM  Fall Risk  Falls in the past year?  1 0    Was there an injury with Fall?  0 0    Fall Risk Category Calculator  1 0    Fall Risk Category  Low Low    Patient Fall Risk Level Moderate fall risk Moderate fall risk Low fall risk Moderate fall risk High fall risk  Patient at Risk for Falls Due to  History of fall(s);Impaired balance/gait;Mental status change;Other (Comment) No Fall Risks    Patient at Risk for Falls Due to - Comments  vascular dementia     Fall risk Follow up  Falls evaluation completed;Education provided;Falls prevention discussed Falls evaluation completed     Functional Status Survey:    Vitals:   11/10/22 1144  BP: (!) 163/82  Pulse: 75  Resp: 20  Temp: 97.6 F (36.4 C)  SpO2: 93%  Weight: 120 lb 3.2 oz (54.5 kg)  Height: 5' (1.524 m)   Body mass index is 23.47 kg/m. Physical Exam Vitals reviewed.  Constitutional:      General: She is not in acute distress. HENT:     Head: Normocephalic.     Right Ear: There is no impacted cerumen.     Left Ear: There is no impacted cerumen.     Nose: Nose normal.     Mouth/Throat:     Mouth: Mucous membranes are moist.  Eyes:     General:        Right eye: No discharge.        Left eye: No discharge.  Cardiovascular:     Rate and Rhythm: Normal rate and regular rhythm.     Pulses: Normal pulses.     Heart sounds: Murmur heard.  Pulmonary:     Effort: Pulmonary effort is normal. No respiratory distress.     Breath sounds: Normal breath sounds. No wheezing or rales.  Abdominal:     General: Bowel sounds are normal. There is no distension.     Palpations: Abdomen is soft.     Tenderness: There is no abdominal tenderness.  Musculoskeletal:     Cervical back: Neck supple.     Right lower leg: No edema.  Left lower leg: No edema.  Skin:     General: Skin is warm and dry.     Findings: Lesion present.     Comments: Skin tear to LUE/LLE CDI, slow healing, surrounding tissue intact  Neurological:     General: No focal deficit present.     Mental Status: She is alert. Mental status is at baseline.     Motor: Weakness present.     Gait: Gait abnormal.     Comments: wheelchair  Psychiatric:        Mood and Affect: Mood normal.        Behavior: Behavior normal.     Comments: Very pleasant, repeats questions often, alert to self/person     Labs reviewed: Recent Labs    02/11/22 0108 06/05/22 0000 09/08/22 0422 09/11/22 0730  NA 138 142 134* 135  K 3.0* 3.9 3.2* 3.4*  CL 106 107 101 96*  CO2 24 29* 25 29  GLUCOSE 110*  --  106* 67  BUN '11 16 10 16  '$ CREATININE 0.64 0.5 0.38* 0.62  CALCIUM 8.3* 8.7 8.9 9.0   Recent Labs    11/24/21 0000 02/09/22 0000 02/10/22 1303 09/11/22 0730  AST '16 23 28 18  '$ ALT '12 22 25 11  '$ ALKPHOS 106 90 97  --   BILITOT  --   --  0.5 0.6  PROT  --   --  6.3* 6.6  ALBUMIN 3.2* 3.7 3.2*  --    Recent Labs    02/11/22 0108 02/20/22 0000 03/09/22 0000 06/05/22 0000 09/08/22 0422 09/11/22 0730  WBC 5.8   < > 3.7 4.2 6.7 5.5  NEUTROABS 4.1   < > 1,550.00  --  5.0 3,801  HGB 8.0*   < > 10.7* 12.5 14.1 14.5  HCT 26.6*   < > 36 39 43.2 42.1  MCV 80.4  --   --   --  95.6 89.6  PLT 216   < > 266 209 220 236   < > = values in this interval not displayed.   Lab Results  Component Value Date   TSH 2.90 11/24/2021   No results found for: "HGBA1C" Lab Results  Component Value Date   CHOL 86 01/23/2022   HDL 43 01/23/2022   LDLCALC 27 01/23/2022   TRIG 77 01/23/2022   CHOLHDL 1.9 09/17/2020    Significant Diagnostic Results in last 30 days:  No results found.  Assessment/Plan 1. Moderate late onset Alzheimer's dementia with other behavioral disturbance ([place]) - MMSE 16/30 - adjusting well to SNF - now ambulates with wheelchair - cont depakote, ativan, Zyprexa, and  sertraline  2. Compression fracture of body of thoracic vertebra ([place]) - 08/2022 mechanical fall - CT chest compression fractures to T3/T7/T11 - no pain at this time - cont Percocet prn  3. Primary hypertension - controlled  - cont amlodipine, HCTZ, losartan, and metoprolol   4. Grade I diastolic dysfunction   5. PAF (paroxysmal atrial fibrillation) (HCC) - HR controlled with metoprolol - off anticoagulation due to falls  6. Coronary artery disease due to lipid rich plaque - cont statin  7. Hypercholesteremia - cont statin  8. Iron deficiency anemia, unspecified iron deficiency anemia type - h/o + hemoccults/ low hgb 01/2022 - cont ferrous sulfate and folic acid  9. Anxiety and depression - no mood changes - cont zoloft and ativan prn  10. Skin tear of left upper arm without complication, subsequent encounter - improved, slow healing - no  sign of infection - cont daily dressing changes  11. Skin tear of left lower leg without complication, subsequent encounter - see above    Family/ staff Communication: plan discussed with patient and nurse  Labs/tests ordered:  none

## 2022-11-13 DIAGNOSIS — Z9181 History of falling: Secondary | ICD-10-CM | POA: Diagnosis not present

## 2022-11-13 DIAGNOSIS — M545 Low back pain, unspecified: Secondary | ICD-10-CM | POA: Diagnosis not present

## 2022-11-13 DIAGNOSIS — R41841 Cognitive communication deficit: Secondary | ICD-10-CM | POA: Diagnosis not present

## 2022-11-13 DIAGNOSIS — I4891 Unspecified atrial fibrillation: Secondary | ICD-10-CM | POA: Diagnosis not present

## 2022-11-13 DIAGNOSIS — R4189 Other symptoms and signs involving cognitive functions and awareness: Secondary | ICD-10-CM | POA: Diagnosis not present

## 2022-11-13 DIAGNOSIS — M6281 Muscle weakness (generalized): Secondary | ICD-10-CM | POA: Diagnosis not present

## 2022-11-14 DIAGNOSIS — R4189 Other symptoms and signs involving cognitive functions and awareness: Secondary | ICD-10-CM | POA: Diagnosis not present

## 2022-11-14 DIAGNOSIS — M6281 Muscle weakness (generalized): Secondary | ICD-10-CM | POA: Diagnosis not present

## 2022-11-14 DIAGNOSIS — R41841 Cognitive communication deficit: Secondary | ICD-10-CM | POA: Diagnosis not present

## 2022-11-14 DIAGNOSIS — Z9181 History of falling: Secondary | ICD-10-CM | POA: Diagnosis not present

## 2022-11-14 DIAGNOSIS — M545 Low back pain, unspecified: Secondary | ICD-10-CM | POA: Diagnosis not present

## 2022-11-14 DIAGNOSIS — I4891 Unspecified atrial fibrillation: Secondary | ICD-10-CM | POA: Diagnosis not present

## 2022-11-15 DIAGNOSIS — R41841 Cognitive communication deficit: Secondary | ICD-10-CM | POA: Diagnosis not present

## 2022-11-15 DIAGNOSIS — I4891 Unspecified atrial fibrillation: Secondary | ICD-10-CM | POA: Diagnosis not present

## 2022-11-15 DIAGNOSIS — M6281 Muscle weakness (generalized): Secondary | ICD-10-CM | POA: Diagnosis not present

## 2022-11-15 DIAGNOSIS — R4189 Other symptoms and signs involving cognitive functions and awareness: Secondary | ICD-10-CM | POA: Diagnosis not present

## 2022-11-15 DIAGNOSIS — Z9181 History of falling: Secondary | ICD-10-CM | POA: Diagnosis not present

## 2022-11-15 DIAGNOSIS — M545 Low back pain, unspecified: Secondary | ICD-10-CM | POA: Diagnosis not present

## 2022-11-16 DIAGNOSIS — Z9181 History of falling: Secondary | ICD-10-CM | POA: Diagnosis not present

## 2022-11-16 DIAGNOSIS — M545 Low back pain, unspecified: Secondary | ICD-10-CM | POA: Diagnosis not present

## 2022-11-16 DIAGNOSIS — I4891 Unspecified atrial fibrillation: Secondary | ICD-10-CM | POA: Diagnosis not present

## 2022-11-16 DIAGNOSIS — M6281 Muscle weakness (generalized): Secondary | ICD-10-CM | POA: Diagnosis not present

## 2022-11-16 DIAGNOSIS — R4189 Other symptoms and signs involving cognitive functions and awareness: Secondary | ICD-10-CM | POA: Diagnosis not present

## 2022-11-16 DIAGNOSIS — R41841 Cognitive communication deficit: Secondary | ICD-10-CM | POA: Diagnosis not present

## 2022-11-17 DIAGNOSIS — M545 Low back pain, unspecified: Secondary | ICD-10-CM | POA: Diagnosis not present

## 2022-11-17 DIAGNOSIS — Z9181 History of falling: Secondary | ICD-10-CM | POA: Diagnosis not present

## 2022-11-17 DIAGNOSIS — R41841 Cognitive communication deficit: Secondary | ICD-10-CM | POA: Diagnosis not present

## 2022-11-17 DIAGNOSIS — R4189 Other symptoms and signs involving cognitive functions and awareness: Secondary | ICD-10-CM | POA: Diagnosis not present

## 2022-11-17 DIAGNOSIS — M6281 Muscle weakness (generalized): Secondary | ICD-10-CM | POA: Diagnosis not present

## 2022-11-17 DIAGNOSIS — I4891 Unspecified atrial fibrillation: Secondary | ICD-10-CM | POA: Diagnosis not present

## 2022-11-20 DIAGNOSIS — M545 Low back pain, unspecified: Secondary | ICD-10-CM | POA: Diagnosis not present

## 2022-11-20 DIAGNOSIS — Z9181 History of falling: Secondary | ICD-10-CM | POA: Diagnosis not present

## 2022-11-20 DIAGNOSIS — R4189 Other symptoms and signs involving cognitive functions and awareness: Secondary | ICD-10-CM | POA: Diagnosis not present

## 2022-11-20 DIAGNOSIS — R41841 Cognitive communication deficit: Secondary | ICD-10-CM | POA: Diagnosis not present

## 2022-11-20 DIAGNOSIS — I4891 Unspecified atrial fibrillation: Secondary | ICD-10-CM | POA: Diagnosis not present

## 2022-11-20 DIAGNOSIS — M6281 Muscle weakness (generalized): Secondary | ICD-10-CM | POA: Diagnosis not present

## 2022-11-21 DIAGNOSIS — Z9181 History of falling: Secondary | ICD-10-CM | POA: Diagnosis not present

## 2022-11-21 DIAGNOSIS — M6281 Muscle weakness (generalized): Secondary | ICD-10-CM | POA: Diagnosis not present

## 2022-11-21 DIAGNOSIS — M545 Low back pain, unspecified: Secondary | ICD-10-CM | POA: Diagnosis not present

## 2022-11-21 DIAGNOSIS — R41841 Cognitive communication deficit: Secondary | ICD-10-CM | POA: Diagnosis not present

## 2022-11-21 DIAGNOSIS — R4189 Other symptoms and signs involving cognitive functions and awareness: Secondary | ICD-10-CM | POA: Diagnosis not present

## 2022-11-21 DIAGNOSIS — I4891 Unspecified atrial fibrillation: Secondary | ICD-10-CM | POA: Diagnosis not present

## 2022-11-22 DIAGNOSIS — M6281 Muscle weakness (generalized): Secondary | ICD-10-CM | POA: Diagnosis not present

## 2022-11-22 DIAGNOSIS — R41841 Cognitive communication deficit: Secondary | ICD-10-CM | POA: Diagnosis not present

## 2022-11-22 DIAGNOSIS — R4189 Other symptoms and signs involving cognitive functions and awareness: Secondary | ICD-10-CM | POA: Diagnosis not present

## 2022-11-22 DIAGNOSIS — M545 Low back pain, unspecified: Secondary | ICD-10-CM | POA: Diagnosis not present

## 2022-11-22 DIAGNOSIS — Z9181 History of falling: Secondary | ICD-10-CM | POA: Diagnosis not present

## 2022-11-22 DIAGNOSIS — I4891 Unspecified atrial fibrillation: Secondary | ICD-10-CM | POA: Diagnosis not present

## 2022-11-23 DIAGNOSIS — M6281 Muscle weakness (generalized): Secondary | ICD-10-CM | POA: Diagnosis not present

## 2022-11-23 DIAGNOSIS — I4891 Unspecified atrial fibrillation: Secondary | ICD-10-CM | POA: Diagnosis not present

## 2022-11-23 DIAGNOSIS — Z9181 History of falling: Secondary | ICD-10-CM | POA: Diagnosis not present

## 2022-11-23 DIAGNOSIS — R41841 Cognitive communication deficit: Secondary | ICD-10-CM | POA: Diagnosis not present

## 2022-11-23 DIAGNOSIS — R4189 Other symptoms and signs involving cognitive functions and awareness: Secondary | ICD-10-CM | POA: Diagnosis not present

## 2022-11-23 DIAGNOSIS — M545 Low back pain, unspecified: Secondary | ICD-10-CM | POA: Diagnosis not present

## 2022-11-24 DIAGNOSIS — M6281 Muscle weakness (generalized): Secondary | ICD-10-CM | POA: Diagnosis not present

## 2022-11-24 DIAGNOSIS — R4189 Other symptoms and signs involving cognitive functions and awareness: Secondary | ICD-10-CM | POA: Diagnosis not present

## 2022-11-24 DIAGNOSIS — I4891 Unspecified atrial fibrillation: Secondary | ICD-10-CM | POA: Diagnosis not present

## 2022-11-24 DIAGNOSIS — R41841 Cognitive communication deficit: Secondary | ICD-10-CM | POA: Diagnosis not present

## 2022-11-24 DIAGNOSIS — Z9181 History of falling: Secondary | ICD-10-CM | POA: Diagnosis not present

## 2022-11-24 DIAGNOSIS — R2681 Unsteadiness on feet: Secondary | ICD-10-CM | POA: Diagnosis not present

## 2022-11-24 DIAGNOSIS — M545 Low back pain, unspecified: Secondary | ICD-10-CM | POA: Diagnosis not present

## 2022-11-25 DIAGNOSIS — R41841 Cognitive communication deficit: Secondary | ICD-10-CM | POA: Diagnosis not present

## 2022-11-25 DIAGNOSIS — Z9181 History of falling: Secondary | ICD-10-CM | POA: Diagnosis not present

## 2022-11-25 DIAGNOSIS — I4891 Unspecified atrial fibrillation: Secondary | ICD-10-CM | POA: Diagnosis not present

## 2022-11-25 DIAGNOSIS — M545 Low back pain, unspecified: Secondary | ICD-10-CM | POA: Diagnosis not present

## 2022-11-25 DIAGNOSIS — R4189 Other symptoms and signs involving cognitive functions and awareness: Secondary | ICD-10-CM | POA: Diagnosis not present

## 2022-11-25 DIAGNOSIS — M6281 Muscle weakness (generalized): Secondary | ICD-10-CM | POA: Diagnosis not present

## 2022-11-26 DIAGNOSIS — I4891 Unspecified atrial fibrillation: Secondary | ICD-10-CM | POA: Diagnosis not present

## 2022-11-26 DIAGNOSIS — M545 Low back pain, unspecified: Secondary | ICD-10-CM | POA: Diagnosis not present

## 2022-11-26 DIAGNOSIS — R4189 Other symptoms and signs involving cognitive functions and awareness: Secondary | ICD-10-CM | POA: Diagnosis not present

## 2022-11-26 DIAGNOSIS — Z9181 History of falling: Secondary | ICD-10-CM | POA: Diagnosis not present

## 2022-11-26 DIAGNOSIS — R41841 Cognitive communication deficit: Secondary | ICD-10-CM | POA: Diagnosis not present

## 2022-11-26 DIAGNOSIS — M6281 Muscle weakness (generalized): Secondary | ICD-10-CM | POA: Diagnosis not present

## 2022-11-27 DIAGNOSIS — M545 Low back pain, unspecified: Secondary | ICD-10-CM | POA: Diagnosis not present

## 2022-11-27 DIAGNOSIS — I4891 Unspecified atrial fibrillation: Secondary | ICD-10-CM | POA: Diagnosis not present

## 2022-11-27 DIAGNOSIS — M6281 Muscle weakness (generalized): Secondary | ICD-10-CM | POA: Diagnosis not present

## 2022-11-27 DIAGNOSIS — R41841 Cognitive communication deficit: Secondary | ICD-10-CM | POA: Diagnosis not present

## 2022-11-27 DIAGNOSIS — R4189 Other symptoms and signs involving cognitive functions and awareness: Secondary | ICD-10-CM | POA: Diagnosis not present

## 2022-11-27 DIAGNOSIS — Z9181 History of falling: Secondary | ICD-10-CM | POA: Diagnosis not present

## 2022-11-28 ENCOUNTER — Encounter: Payer: Self-pay | Admitting: Adult Health

## 2022-11-28 ENCOUNTER — Non-Acute Institutional Stay (SKILLED_NURSING_FACILITY): Payer: Medicare Other | Admitting: Adult Health

## 2022-11-28 DIAGNOSIS — F02B18 Dementia in other diseases classified elsewhere, moderate, with other behavioral disturbance: Secondary | ICD-10-CM | POA: Diagnosis not present

## 2022-11-28 DIAGNOSIS — F32A Depression, unspecified: Secondary | ICD-10-CM | POA: Diagnosis not present

## 2022-11-28 DIAGNOSIS — M545 Low back pain, unspecified: Secondary | ICD-10-CM | POA: Diagnosis not present

## 2022-11-28 DIAGNOSIS — R0781 Pleurodynia: Secondary | ICD-10-CM | POA: Diagnosis not present

## 2022-11-28 DIAGNOSIS — I4891 Unspecified atrial fibrillation: Secondary | ICD-10-CM | POA: Diagnosis not present

## 2022-11-28 DIAGNOSIS — R4189 Other symptoms and signs involving cognitive functions and awareness: Secondary | ICD-10-CM | POA: Diagnosis not present

## 2022-11-28 DIAGNOSIS — Z9181 History of falling: Secondary | ICD-10-CM | POA: Diagnosis not present

## 2022-11-28 DIAGNOSIS — F419 Anxiety disorder, unspecified: Secondary | ICD-10-CM | POA: Diagnosis not present

## 2022-11-28 DIAGNOSIS — G301 Alzheimer's disease with late onset: Secondary | ICD-10-CM

## 2022-11-28 DIAGNOSIS — M6281 Muscle weakness (generalized): Secondary | ICD-10-CM | POA: Diagnosis not present

## 2022-11-28 DIAGNOSIS — R41841 Cognitive communication deficit: Secondary | ICD-10-CM | POA: Diagnosis not present

## 2022-11-28 NOTE — Progress Notes (Signed)
Location:  [place] Room Number: 30-A Place of Service:  SNF (31) Provider:  Durenda Age, DNP, FNP-BC  Patient Care Team: [name], MD as PCP - General (Internal Medicine) Martinique, Peter M, MD as PCP - Cardiology (Cardiology)  Extended Emergency Contact Information Primary Emergency Contact: [name],[name] Address: [address]          [place] [address] [place] Phone: [phone] Mobile Phone: [phone] Relation: Sister Secondary Emergency Contact: [name],[name]  [name] of [place] Phone: [phone] Mobile Phone: [phone] Relation: Niece  Code Status:  DNR  Goals of care: Advanced Directive information    11/28/2022    2:29 PM  Advanced Directives  Does Patient Have a Medical Advance Directive? Yes  Type of Paramedic of [place];Living will;Out of facility DNR (pink MOST or yellow form)  Does patient want to make changes to medical advance directive? No - Patient declined  Copy of [place] in Chart? Yes - validated most recent copy scanned in chart (See row information)  Pre-existing out of facility DNR order (yellow form or pink MOST form) Pink MOST form placed in chart (order not valid for inpatient use)     Chief Complaint  Patient presents with   Acute Visit    S/P fall    HPI:  Pt is a 88 y.o. female seen today for an acute visit post fall. She is a long-term care resident of [facility]. She has a PMH of CAD, hypertension, hypercholesterolemia, paroxysmal atrial fibrillation, emphysema and dementia. She uses wheelchair for ambulation. She got up from her wheelchair and tried to go to bathroom on her own. She was noted to have abrasion at thLeft side of her lower back. She sustained abrasion to the area and complained of tenderness. No noted shortness of breath nor chest pains. Last BIMS score was 5/15. She takes  Olanzapine for dementia with behavioral disturbance. She takes Depakote for mood disorder and Sertraline and Ativan for depression and anxiety.   Past Medical History:  Diagnosis Date   Anxiety and depression    Aortic stenosis    a. 03/2014: Mild aortic stenosis by valve area and mean gradient though appeared more visually consistent with moderate AS.    CAD (coronary artery disease)    a. s/p prior PCI of D2 in 1992 b. stent to RCA in 2005   Diverticula of colon    GERD (gastroesophageal reflux disease)    HTN (hypertension)    Hypercholesteremia    Lateral myocardial infarction ([place]) 1992   Osteoarthritis    Osteopenia    Scoliosis    Past Surgical History:  Procedure Laterality Date   ABDOMINAL HYSTERECTOMY     APPENDECTOMY     CORONARY ANGIOPLASTY     TONSILLECTOMY      Allergies  Allergen Reactions   Latex Other (See Comments)    Unknown reaction - listed on [facility] 02/10/22   Nifedipine Other (See Comments)    Dark Urine; can only take orange tablet, allergic to yellow-brown tablet   Tetanus Toxoids Other (See Comments)    Unknown reaction   Tetanus-Diphtheria Toxoids Td Other (See Comments)    Unknown reaction   Adhesive [Tape] Rash    Outpatient Encounter Medications as of 11/28/2022  Medication Sig   acetaminophen (TYLENOL) 500 MG tablet Take 500 mg by mouth in the morning, at noon, and at bedtime.   amLODipine (NORVASC) 5  MG tablet Take 5 mg by mouth daily.   atorvastatin (LIPITOR) 10 MG tablet Take 10 mg by mouth at bedtime.   cetirizine (ZYRTEC) 10 MG tablet Take 10 mg by mouth every morning.   cholecalciferol (VITAMIN D) 25 MCG (1000 UNIT) tablet Take 2,000 Units by mouth every evening.   divalproex (DEPAKOTE SPRINKLE) 125 MG capsule Take 125 mg by mouth at bedtime.   ferrous sulfate 325 (65 FE) MG tablet Take 325 mg by mouth daily with breakfast.   folic acid (FOLVITE) 1 MG tablet Take 1 mg by mouth daily.   hydrochlorothiazide (HYDRODIURIL) 25 MG tablet  Take 25 mg by mouth in the morning.   LORazepam (ATIVAN) 0.5 MG tablet Take 0.5 mg by mouth every 12 (twelve) hours as needed.   LORazepam (ATIVAN) 0.5 MG tablet Take 0.5 mg by mouth daily. For anxiety and agitation   losartan (COZAAR) 100 MG tablet Take 100 mg by mouth at bedtime.   methocarbamol (ROBAXIN) 500 MG tablet Take 250 mg by mouth every 6 (six) hours as needed for muscle spasms.   metoprolol succinate (TOPROL-XL) 50 MG 24 hr tablet Take 50 mg by mouth every evening. Take with or immediately following a meal.   nitroGLYCERIN (NITROSTAT) 0.4 MG SL tablet Place 0.4 mg under the tongue every 5 (five) minutes as needed for chest pain. As needed fo chest pain   OLANZapine (ZYPREXA) 5 MG tablet Take 5 mg by mouth in the morning and at bedtime.   oxyCODONE-acetaminophen (PERCOCET/ROXICET) 5-325 MG tablet Take by mouth every 12 (twelve) hours as needed for severe pain. 1/2 tablet   pantoprazole (PROTONIX) 40 MG tablet Take 40 mg by mouth daily.   Polyethylene Glycol 3350 (PEG 3350) 17 GM/SCOOP POWD Give 17 gram by mouth one time a day every 2 day(s) for constipation   potassium chloride (KLOR-CON M) 10 MEQ tablet Take 10 mEq by mouth daily.   senna (SENOKOT) 8.6 MG TABS tablet Take 1 tablet by mouth in the morning.   sertraline (ZOLOFT) 25 MG tablet Take 50 mg by mouth at bedtime.   No facility-administered encounter medications on file as of 11/28/2022.    Review of Systems   Unable to obtain due to dementia   Immunization History  Administered Date(s) Administered   Fluad Quad(high Dose 65+) 10/18/2022   Influenza Split 05/13/2009, 05/18/2010, 09/29/2011, 09/05/2012, 09/03/2013   Influenza, High Dose Seasonal PF 09/09/2015, 08/30/2017   Influenza, Quadrivalent, Recombinant, Inj, Pf 08/19/2018, 09/29/2019   Influenza-Unspecified 10/18/2021   Moderna Covid-19 Vaccine Bivalent Booster 75yr & up 09/14/2021, 06/07/2022   Moderna SARS-COV2 Booster Vaccination 11/08/2020, 06/01/2021    Moderna Sars-Covid-2 Vaccination 12/29/2019, 01/26/2020   PFIZER(Purple Top)SARS-COV-2 Vaccination 10/31/2022   Pfizer Covid-19 Vaccine Bivalent Booster 169yr& up 09/14/2020   Pneumococcal Conjugate-13 07/08/2013   Pneumococcal Polysaccharide-23 06/29/2006, 05/13/2009, 05/18/2010, 05/25/2011   Td 05/13/2009, 05/18/2010, 05/25/2011   Tdap 03/20/2022   Zoster Recombinat (Shingrix) 08/24/2017, 10/31/2017   Zoster, Live 08/22/2008, 08/24/2008, 05/13/2009, 05/18/2010, 05/25/2011   Pertinent  Health Maintenance Due  Topic Date Due   INFLUENZA VACCINE  Completed   DEXA SCAN  Completed      02/11/2022    8:00 AM 03/20/2022   10:22 AM 09/07/2022   12:11 PM 09/08/2022    3:52 AM 09/09/2022   10:24 PM  Fall Risk  Falls in the past year?  1 0    Was there an injury with Fall?  0 0    Fall Risk Category Calculator  1 0    Fall Risk Category  Low Low    Patient Fall Risk Level Moderate fall risk Moderate fall risk Low fall risk Moderate fall risk High fall risk  Patient at Risk for Falls Due to  History of fall(s);Impaired balance/gait;Mental status change;Other (Comment) No Fall Risks    Patient at Risk for Falls Due to - Comments  vascular dementia     Fall risk Follow up  Falls evaluation completed;Education provided;Falls prevention discussed Falls evaluation completed       Vitals:   11/28/22 1427  BP: (!) 173/83  Pulse: 72  Resp: (!) 22  SpO2: 97%  Weight: 120 lb 8 oz (54.7 kg)  Height: 5' (1.524 m)   Body mass index is 23.53 kg/m.  Physical Exam Constitutional:      General: She is not in acute distress.    Appearance: Normal appearance.  HENT:     Head: Normocephalic and atraumatic.     Nose: Nose normal.     Mouth/Throat:     Mouth: Mucous membranes are moist.  Eyes:     Conjunctiva/sclera: Conjunctivae normal.  Cardiovascular:     Rate and Rhythm: Normal rate and regular rhythm.  Pulmonary:     Effort: Pulmonary effort is normal.     Breath sounds: Normal breath  sounds.  Abdominal:     General: Bowel sounds are normal.     Palpations: Abdomen is soft.  Musculoskeletal:        General: Normal range of motion.     Cervical back: Normal range of motion.  Skin:    General: Skin is warm and dry.     Comments: Abrasion to left lower back  Neurological:     Mental Status: She is alert. Mental status is at baseline. She is disoriented.  Psychiatric:        Mood and Affect: Mood normal.        Labs reviewed: Recent Labs    02/11/22 0108 06/05/22 0000 09/08/22 0422 09/11/22 0730  NA 138 142 134* 135  K 3.0* 3.9 3.2* 3.4*  CL 106 107 101 96*  CO2 24 29* 25 29  GLUCOSE 110*  --  106* 67  BUN '11 16 10 16  '$ CREATININE 0.64 0.5 0.38* 0.62  CALCIUM 8.3* 8.7 8.9 9.0   Recent Labs    02/09/22 0000 02/10/22 1303 09/11/22 0730  AST '23 28 18  '$ ALT '22 25 11  '$ ALKPHOS 90 97  --   BILITOT  --  0.5 0.6  PROT  --  6.3* 6.6  ALBUMIN 3.7 3.2*  --    Recent Labs    02/11/22 0108 02/20/22 0000 03/09/22 0000 06/05/22 0000 09/08/22 0422 09/11/22 0730  WBC 5.8   < > 3.7 4.2 6.7 5.5  NEUTROABS 4.1   < > 1,550.00  --  5.0 3,801  HGB 8.0*   < > 10.7* 12.5 14.1 14.5  HCT 26.6*   < > 36 39 43.2 42.1  MCV 80.4  --   --   --  95.6 89.6  PLT 216   < > 266 209 220 236   < > = values in this interval not displayed.   Lab Results  Component Value Date   TSH 2.90 11/24/2021   No results found for: "HGBA1C" Lab Results  Component Value Date   CHOL 86 01/23/2022   HDL 43 01/23/2022   LDLCALC 27 01/23/2022   TRIG 77 01/23/2022   CHOLHDL 1.9 09/17/2020  Significant Diagnostic Results in last 30 days:  No results found.  Assessment/Plan  1. Status post fall - attempted to go to bathroom unassisted  2. Rib pain on left side -  chest x-ray ordered to rule out fracture  3. Anxiety and depression -   mood is stable, continue Ativan PRN and Sertraline  4. Moderate late onset Alzheimer's dementia with other behavioral disturbance ([place]) -   has severe cognitive impairment -  continue supportive care -  continue palliative care -  continue Olanzapine    Family/ staff Communication: Discussed plan of care with resident and charge nurse.  Labs/tests ordered:  chest x-ray of left ribs    Durenda Age, DNP, MSN, FNP-BC [place] [phone] (Monday-Friday 8:00 a.m. - 5:00 p.m.) [phone] (after hours)

## 2022-11-29 DIAGNOSIS — I4891 Unspecified atrial fibrillation: Secondary | ICD-10-CM | POA: Diagnosis not present

## 2022-11-29 DIAGNOSIS — Z9181 History of falling: Secondary | ICD-10-CM | POA: Diagnosis not present

## 2022-11-29 DIAGNOSIS — M545 Low back pain, unspecified: Secondary | ICD-10-CM | POA: Diagnosis not present

## 2022-11-29 DIAGNOSIS — R079 Chest pain, unspecified: Secondary | ICD-10-CM | POA: Diagnosis not present

## 2022-11-29 DIAGNOSIS — R4189 Other symptoms and signs involving cognitive functions and awareness: Secondary | ICD-10-CM | POA: Diagnosis not present

## 2022-11-29 DIAGNOSIS — M6281 Muscle weakness (generalized): Secondary | ICD-10-CM | POA: Diagnosis not present

## 2022-11-29 DIAGNOSIS — R41841 Cognitive communication deficit: Secondary | ICD-10-CM | POA: Diagnosis not present

## 2022-11-30 DIAGNOSIS — I4891 Unspecified atrial fibrillation: Secondary | ICD-10-CM | POA: Diagnosis not present

## 2022-11-30 DIAGNOSIS — M6281 Muscle weakness (generalized): Secondary | ICD-10-CM | POA: Diagnosis not present

## 2022-11-30 DIAGNOSIS — R41841 Cognitive communication deficit: Secondary | ICD-10-CM | POA: Diagnosis not present

## 2022-11-30 DIAGNOSIS — M545 Low back pain, unspecified: Secondary | ICD-10-CM | POA: Diagnosis not present

## 2022-11-30 DIAGNOSIS — Z9181 History of falling: Secondary | ICD-10-CM | POA: Diagnosis not present

## 2022-11-30 DIAGNOSIS — R4189 Other symptoms and signs involving cognitive functions and awareness: Secondary | ICD-10-CM | POA: Diagnosis not present

## 2022-12-01 ENCOUNTER — Encounter: Payer: Self-pay | Admitting: Orthopedic Surgery

## 2022-12-01 ENCOUNTER — Non-Acute Institutional Stay (SKILLED_NURSING_FACILITY): Payer: Medicare Other | Admitting: Orthopedic Surgery

## 2022-12-01 DIAGNOSIS — F02B18 Dementia in other diseases classified elsewhere, moderate, with other behavioral disturbance: Secondary | ICD-10-CM | POA: Diagnosis not present

## 2022-12-01 DIAGNOSIS — G301 Alzheimer's disease with late onset: Secondary | ICD-10-CM

## 2022-12-01 DIAGNOSIS — S2242XA Multiple fractures of ribs, left side, initial encounter for closed fracture: Secondary | ICD-10-CM | POA: Diagnosis not present

## 2022-12-01 DIAGNOSIS — R296 Repeated falls: Secondary | ICD-10-CM | POA: Diagnosis not present

## 2022-12-01 DIAGNOSIS — N390 Urinary tract infection, site not specified: Secondary | ICD-10-CM | POA: Diagnosis not present

## 2022-12-01 MED ORDER — OXYCODONE-ACETAMINOPHEN 5-325 MG PO TABS: 1.0000 | ORAL_TABLET | Freq: Two times a day (BID) | ORAL | 0 refills | Status: AC | PRN

## 2022-12-01 MED ORDER — LIDOCAINE 4 % EX PTCH: 1.0000 | MEDICATED_PATCH | CUTANEOUS | 0 refills | Status: AC

## 2022-12-01 NOTE — Progress Notes (Signed)
Location:  [place] Room Number: 30-A Place of Service:  SNF [phone]) Provider:  [name], NP   [name], MD  Patient Care Team: [name], MD as PCP - General (Internal Medicine) Martinique, Peter M, MD as PCP - Cardiology (Cardiology)  Extended Emergency Contact Information Primary Emergency Contact: [name],[name] Address: [address]          [place] [address] [name] of [place] Phone: [phone] Mobile Phone: [phone] Relation: Sister Secondary Emergency Contact: [name],[name]  [name] of [place] Phone: [phone] Mobile Phone: [phone] Relation: Niece  Code Status:  DNR Goals of care: Advanced Directive information    12/01/2022   11:43 AM  Advanced Directives  Does Patient Have a Medical Advance Directive? Yes  Type of Paramedic of [place];Living will;Out of facility DNR (pink MOST or yellow form)  Does patient want to make changes to medical advance directive? No - Patient declined  Copy of [place] in Chart? Yes - validated most recent copy scanned in chart (See row information)  Pre-existing out of facility DNR order (yellow form or pink MOST form) Pink MOST form placed in chart (order not valid for inpatient use)     Chief Complaint  Patient presents with   Acute Visit    Acute Visit with the provider on site at [facility] for Left Rib Pain.    HPI:  Pt is a 88 y.o. female seen today for acute visit due to left sided rib pain.   She currently resides on the skilled living unit at [facility]. PMH: aortic stenosis,CAD, HTN, HLD, atrial fib, emphysema, GERD, vascular dementia, and depression.     12/05 she was found on the bathroom floor by nursing. She c/o left sided pain after event. CXR revealed acute transverse minimally displaced fractures at 3rd to 10th ribs near rib angle. She is being given 1/2 percocet prn for  pain. She reports increased left sided pain with movement.   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   Dementia- 04/2021 CT head noted chronic vascular changes, MMSE 16/30, no behavioral outbursts, [oor safety awareness, transferred to [facility] 10/05/2022, remains on Depakote, ativan, Zyprexa and Sertraline   Past Medical History:  Diagnosis Date   Anxiety and depression    Aortic stenosis    a. 03/2014: Mild aortic stenosis by valve area and mean gradient though appeared more visually consistent with moderate AS.    CAD (coronary artery disease)    a. s/p prior PCI of D2 in 1992 b. stent to RCA in 2005   Diverticula of colon    GERD (gastroesophageal reflux disease)    HTN (hypertension)    Hypercholesteremia    Lateral myocardial infarction ([place]) 1992   Osteoarthritis    Osteopenia    Scoliosis    Past Surgical History:  Procedure Laterality Date   ABDOMINAL HYSTERECTOMY     APPENDECTOMY     CORONARY ANGIOPLASTY     TONSILLECTOMY      Allergies  Allergen Reactions   Latex Other (See Comments)    Unknown reaction - listed on [facility] 02/10/22   Nifedipine Other (See Comments)    Dark Urine; can only take orange tablet, allergic to yellow-brown tablet   Tetanus Toxoids Other (See Comments)  Unknown reaction   Tetanus-Diphtheria Toxoids Td Other (See Comments)    Unknown reaction   Adhesive [Tape] Rash    Outpatient Encounter Medications as of 12/01/2022  Medication Sig   acetaminophen (TYLENOL) 500 MG tablet Take 500 mg by mouth in the morning, at noon, and at bedtime.   amLODipine (NORVASC) 5 MG tablet Take 5 mg by mouth daily.   atorvastatin (LIPITOR) 10 MG tablet Take 10 mg by mouth at bedtime.   cetirizine (ZYRTEC) 10 MG tablet Take 10 mg by mouth every morning.   cholecalciferol (VITAMIN D) 25 MCG (1000  UNIT) tablet Take 2,000 Units by mouth every evening.   divalproex (DEPAKOTE SPRINKLE) 125 MG capsule Take 125 mg by mouth at bedtime.   ferrous sulfate 325 (65 FE) MG tablet Take 325 mg by mouth daily with breakfast.   folic acid (FOLVITE) 1 MG tablet Take 1 mg by mouth daily.   hydrochlorothiazide (HYDRODIURIL) 25 MG tablet Take 25 mg by mouth in the morning.   LORazepam (ATIVAN) 0.5 MG tablet Take 0.5 mg by mouth every 12 (twelve) hours as needed.   LORazepam (ATIVAN) 0.5 MG tablet Take 0.5 mg by mouth daily. For anxiety and agitation   losartan (COZAAR) 100 MG tablet Take 100 mg by mouth at bedtime.   methocarbamol (ROBAXIN) 500 MG tablet Take 250 mg by mouth every 6 (six) hours as needed for muscle spasms.   metoprolol succinate (TOPROL-XL) 50 MG 24 hr tablet Take 50 mg by mouth every evening. Take with or immediately following a meal.   nitroGLYCERIN (NITROSTAT) 0.4 MG SL tablet Place 0.4 mg under the tongue every 5 (five) minutes as needed for chest pain. As needed fo chest pain   OLANZapine (ZYPREXA) 5 MG tablet Take 5 mg by mouth in the morning and at bedtime.   oxyCODONE-acetaminophen (PERCOCET/ROXICET) 5-325 MG tablet Take by mouth every 12 (twelve) hours as needed for severe pain. 1/2 tablet   pantoprazole (PROTONIX) 40 MG tablet Take 40 mg by mouth daily.   Polyethylene Glycol 3350 (PEG 3350) 17 GM/SCOOP POWD Give 17 gram by mouth one time a day every 2 day(s) for constipation   potassium chloride (KLOR-CON M) 10 MEQ tablet Take 10 mEq by mouth daily.   senna (SENOKOT) 8.6 MG TABS tablet Take 1 tablet by mouth in the morning.   sertraline (ZOLOFT) 25 MG tablet Take 50 mg by mouth at bedtime.   No facility-administered encounter medications on file as of 12/01/2022.    Review of Systems  Unable to perform ROS: Dementia    Immunization History  Administered Date(s) Administered   Fluad Quad(high Dose 65+) 10/18/2022   Influenza Split 05/13/2009, 05/18/2010, 09/29/2011,  09/05/2012, 09/03/2013   Influenza, High Dose Seasonal PF 09/09/2015, 08/30/2017   Influenza, Quadrivalent, Recombinant, Inj, Pf 08/19/2018, 09/29/2019   Influenza-Unspecified 10/18/2021   Moderna Covid-19 Vaccine Bivalent Booster 38yr & up 09/14/2021, 06/07/2022   Moderna SARS-COV2 Booster Vaccination 11/08/2020, 06/01/2021   Moderna Sars-Covid-2 Vaccination 12/29/2019, 01/26/2020   PFIZER(Purple Top)SARS-COV-2 Vaccination 10/31/2022   Pfizer Covid-19 Vaccine Bivalent Booster 143yr& up 09/14/2020   Pneumococcal Conjugate-13 07/08/2013   Pneumococcal Polysaccharide-23 06/29/2006, 05/13/2009, 05/18/2010, 05/25/2011   Td 05/13/2009, 05/18/2010, 05/25/2011   Tdap 03/20/2022   Zoster Recombinat (Shingrix) 08/24/2017, 10/31/2017   Zoster, Live 08/22/2008, 08/24/2008, 05/13/2009, 05/18/2010, 05/25/2011   Pertinent  Health Maintenance Due  Topic Date Due   INFLUENZA VACCINE  Completed   DEXA SCAN  Completed      03/20/2022  10:22 AM 09/07/2022   12:11 PM 09/08/2022    3:52 AM 09/09/2022   10:24 PM 12/01/2022   11:44 AM  Fall Risk  Falls in the past year? 1 0   1  Was there an injury with Fall? 0 0   0  Fall Risk Category Calculator 1 0   1  Fall Risk Category Low Low   Low  Patient Fall Risk Level Moderate fall risk Low fall risk Moderate fall risk High fall risk High fall risk  Patient at Risk for Falls Due to History of fall(s);Impaired balance/gait;Mental status change;Other (Comment) No Fall Risks   History of fall(s)  Patient at Risk for Falls Due to - Comments vascular dementia      Fall risk Follow up Falls evaluation completed;Education provided;Falls prevention discussed Falls evaluation completed   Falls evaluation completed   Functional Status Survey:    Vitals:   12/01/22 1137  BP: (!) 168/88  Pulse: 63  Resp: 20  Temp: 98.2 F (36.8 C)  SpO2: 94%  Weight: 120 lb 5 oz (54.6 kg)  Height: 5' (1.524 m)   Body mass index is 23.5 kg/m. Physical Exam Vitals  reviewed.  Constitutional:      General: She is not in acute distress. Eyes:     General:        Right eye: No discharge.        Left eye: No discharge.  Cardiovascular:     Rate and Rhythm: Normal rate and regular rhythm.     Pulses: Normal pulses.     Heart sounds: Normal heart sounds.  Pulmonary:     Effort: Pulmonary effort is normal. No respiratory distress.     Breath sounds: Normal breath sounds. No wheezing or rales.  Chest:     Chest wall: Tenderness present.     Comments: Left side mid axillary with large linear bruise/tenderness Abdominal:     General: Bowel sounds are normal. There is no distension.     Palpations: Abdomen is soft.     Tenderness: There is no abdominal tenderness.  Musculoskeletal:     Cervical back: Neck supple.     Right lower leg: No edema.     Left lower leg: No edema.     Comments: Able to move all extremities without difficulty  Skin:    General: Skin is warm and dry.     Capillary Refill: Capillary refill takes less than 2 seconds.  Neurological:     General: No focal deficit present.     Mental Status: She is alert. Mental status is at baseline.     Motor: Weakness present.     Gait: Gait abnormal.     Comments: wheelchair  Psychiatric:        Mood and Affect: Mood normal.        Behavior: Behavior normal.     Labs reviewed: Recent Labs    02/11/22 0108 06/05/22 0000 09/08/22 0422 09/11/22 0730  NA 138 142 134* 135  K 3.0* 3.9 3.2* 3.4*  CL 106 107 101 96*  CO2 24 29* 25 29  GLUCOSE 110*  --  106* 67  BUN '11 16 10 16  '$ CREATININE 0.64 0.5 0.38* 0.62  CALCIUM 8.3* 8.7 8.9 9.0   Recent Labs    02/09/22 0000 02/10/22 1303 09/11/22 0730  AST '23 28 18  '$ ALT '22 25 11  '$ ALKPHOS 90 97  --   BILITOT  --  0.5 0.6  PROT  --  6.3* 6.6  ALBUMIN 3.7 3.2*  --    Recent Labs    02/11/22 0108 02/20/22 0000 03/09/22 0000 06/05/22 0000 09/08/22 0422 09/11/22 0730  WBC 5.8   < > 3.7 4.2 6.7 5.5  NEUTROABS 4.1   < > 1,550.00   --  5.0 3,801  HGB 8.0*   < > 10.7* 12.5 14.1 14.5  HCT 26.6*   < > 36 39 43.2 42.1  MCV 80.4  --   --   --  95.6 89.6  PLT 216   < > 266 209 220 236   < > = values in this interval not displayed.   Lab Results  Component Value Date   TSH 2.90 11/24/2021   No results found for: "HGBA1C" Lab Results  Component Value Date   CHOL 86 01/23/2022   HDL 43 01/23/2022   LDLCALC 27 01/23/2022   TRIG 77 01/23/2022   CHOLHDL 1.9 09/17/2020    Significant Diagnostic Results in last 30 days:  No results found.  Assessment/Plan 1. Closed fracture of multiple ribs of left side, initial encounter - 12/05 mechanical fall - CXR revealed acute transverse minimally displaced fractures at 3rd to 10th ribs near rib angle - bruising and tenderness to left mid axillary  - will start lidocaine patches daily and increase Percocet x 14 days - lidocaine 4 %; Place 1 patch onto the skin daily for 21 days. Apply to left mid axillary  Dispense: 21 patch; Refill: 0 - oxyCODONE-acetaminophen (PERCOCET/ROXICET) 5-325 MG tablet; Take 1 tablet by mouth every 12 (twelve) hours as needed for up to 14 days for severe pain.  Dispense: 28 tablet; Refill: 0  2. Frequent falls - see above - h/o mechanical fall with fracture 08/2022> fx posterior right 9th rib, thoracic compression fractures to T3, T7 and T11  3. Moderate late onset Alzheimer's dementia with other behavioral disturbance ([place]) - moved to SNF 09/2022 - no behaviors - poor safety awareness - cont Depakote, Zyprexa, ativan and Zoloft    Family/ staff Communication: plan discussed with patient and nurse  Labs/tests ordered:  none

## 2022-12-04 ENCOUNTER — Other Ambulatory Visit: Payer: Self-pay | Admitting: Orthopedic Surgery

## 2022-12-04 DIAGNOSIS — M6281 Muscle weakness (generalized): Secondary | ICD-10-CM | POA: Diagnosis not present

## 2022-12-04 DIAGNOSIS — F32A Depression, unspecified: Secondary | ICD-10-CM

## 2022-12-04 DIAGNOSIS — R41841 Cognitive communication deficit: Secondary | ICD-10-CM | POA: Diagnosis not present

## 2022-12-04 DIAGNOSIS — R4189 Other symptoms and signs involving cognitive functions and awareness: Secondary | ICD-10-CM | POA: Diagnosis not present

## 2022-12-04 DIAGNOSIS — G301 Alzheimer's disease with late onset: Secondary | ICD-10-CM

## 2022-12-04 DIAGNOSIS — Z9181 History of falling: Secondary | ICD-10-CM | POA: Diagnosis not present

## 2022-12-04 DIAGNOSIS — I4891 Unspecified atrial fibrillation: Secondary | ICD-10-CM | POA: Diagnosis not present

## 2022-12-04 DIAGNOSIS — M545 Low back pain, unspecified: Secondary | ICD-10-CM | POA: Diagnosis not present

## 2022-12-04 MED ORDER — LORAZEPAM 0.5 MG PO TABS: 0.5000 mg | ORAL_TABLET | Freq: Two times a day (BID) | ORAL | 0 refills | Status: DC | PRN

## 2022-12-06 DIAGNOSIS — M6281 Muscle weakness (generalized): Secondary | ICD-10-CM | POA: Diagnosis not present

## 2022-12-06 DIAGNOSIS — R41841 Cognitive communication deficit: Secondary | ICD-10-CM | POA: Diagnosis not present

## 2022-12-06 DIAGNOSIS — I4891 Unspecified atrial fibrillation: Secondary | ICD-10-CM | POA: Diagnosis not present

## 2022-12-06 DIAGNOSIS — R4189 Other symptoms and signs involving cognitive functions and awareness: Secondary | ICD-10-CM | POA: Diagnosis not present

## 2022-12-06 DIAGNOSIS — Z9181 History of falling: Secondary | ICD-10-CM | POA: Diagnosis not present

## 2022-12-06 DIAGNOSIS — M545 Low back pain, unspecified: Secondary | ICD-10-CM | POA: Diagnosis not present

## 2022-12-07 ENCOUNTER — Encounter: Payer: Self-pay | Admitting: Internal Medicine

## 2022-12-07 ENCOUNTER — Non-Acute Institutional Stay (SKILLED_NURSING_FACILITY): Payer: Medicare Other | Admitting: Internal Medicine

## 2022-12-07 DIAGNOSIS — I10 Essential (primary) hypertension: Secondary | ICD-10-CM

## 2022-12-07 DIAGNOSIS — D509 Iron deficiency anemia, unspecified: Secondary | ICD-10-CM | POA: Diagnosis not present

## 2022-12-07 DIAGNOSIS — F419 Anxiety disorder, unspecified: Secondary | ICD-10-CM | POA: Diagnosis not present

## 2022-12-07 DIAGNOSIS — I480 Paroxysmal atrial fibrillation: Secondary | ICD-10-CM

## 2022-12-07 DIAGNOSIS — F02B18 Dementia in other diseases classified elsewhere, moderate, with other behavioral disturbance: Secondary | ICD-10-CM

## 2022-12-07 DIAGNOSIS — F32A Depression, unspecified: Secondary | ICD-10-CM | POA: Diagnosis not present

## 2022-12-07 DIAGNOSIS — S2242XS Multiple fractures of ribs, left side, sequela: Secondary | ICD-10-CM

## 2022-12-07 DIAGNOSIS — G301 Alzheimer's disease with late onset: Secondary | ICD-10-CM | POA: Diagnosis not present

## 2022-12-07 NOTE — Progress Notes (Signed)
Location:  [place] Room Number: [place] of Service:  SNF [phone]) Provider: [name], MD   [name], MD  Patient Care Team: [name], MD as PCP - General (Internal Medicine) Martinique, Peter M, MD as PCP - Cardiology (Cardiology)  Extended Emergency Contact Information Primary Emergency Contact: [name],[name] Address: [address]          [place] [address] [name] of [place] Phone: [phone] Mobile Phone: [phone] Relation: Sister Secondary Emergency Contact: [name],[name]  [name] of [place] Phone: [phone] Mobile Phone: [phone] Relation: Niece  Code Status:  DNR Goals of care: Advanced Directive information    12/07/2022   10:24 AM  Advanced Directives  Does Patient Have a Medical Advance Directive? Yes  Type of Paramedic of [place];Living will;Out of facility DNR (pink MOST or yellow form)  Does patient want to make changes to medical advance directive? No - Patient declined  Copy of [place] in Chart? Yes - validated most recent copy scanned in chart (See row information)  Pre-existing out of facility DNR order (yellow form or pink MOST form) Pink MOST form placed in chart (order not valid for inpatient use)     Chief Complaint  Patient presents with   Medical Management of Chronic Issues    Routine visit    HPI:  Pt is a 88 y.o. female seen today for medical management of chronic diseases.    Lives in SNF  Patient was living in [place] but had to be transferred to SNF as she was unable to do her ADLS and Having behavior issues   Patient has h/o  has h/o CAD s/p Angioplasty and Stenting in RCA HTN, HLD Moderate Aortic stenosis,  H/o PAF Underwent DCCV No Anticoagulation due to her h/o Falls and Acute Anemia Chronic Back pain Unstable Gait and Falls Sustained Compression Fractures of T 6 and T 10 L3 Fracture Anemia Refused  GI work up Dementia    Had another fall in 12/05 Chest Xray found Fractures of 3 -10 ribs Now on Percocet Prn for pain She says her pain is better No Other issues   Behavior issues Continue but today she was more sleepy in the morning which is new for her She did wake up later in the day Her weight is stable Uses Wheelchair mostly  No Falls Wt Readings from Last 3 Encounters:  12/07/22 120 lb 8 oz (54.7 kg)  12/01/22 120 lb 5 oz (54.6 kg)  11/28/22 120 lb 8 oz (54.7 kg)   Past Medical History:  Diagnosis Date   Anxiety and depression    Aortic stenosis    a. 03/2014: Mild aortic stenosis by valve area and mean gradient though appeared more visually consistent with moderate AS.    CAD (coronary artery disease)    a. s/p prior PCI of D2 in 1992 b. stent to RCA in 2005   Diverticula of colon    GERD (gastroesophageal reflux disease)    HTN (hypertension)    Hypercholesteremia    Lateral myocardial infarction [facility]) 1992   Osteoarthritis    Osteopenia    Scoliosis    Past Surgical History:  Procedure Laterality Date   ABDOMINAL HYSTERECTOMY     APPENDECTOMY     CORONARY ANGIOPLASTY     TONSILLECTOMY      Allergies  Allergen Reactions   Latex Other (See Comments)    Unknown reaction - listed  on [facility] 02/10/22   Nifedipine Other (See Comments)    Dark Urine; can only take orange tablet, allergic to yellow-brown tablet   Tetanus Toxoids Other (See Comments)    Unknown reaction   Tetanus-Diphtheria Toxoids Td Other (See Comments)    Unknown reaction   Adhesive [Tape] Rash    Outpatient Encounter Medications as of 12/07/2022  Medication Sig   acetaminophen (TYLENOL) 500 MG tablet Take 500 mg by mouth in the morning, at noon, and at bedtime.   amLODipine (NORVASC) 5 MG tablet Take 5 mg by mouth daily.   atorvastatin (LIPITOR) 10 MG tablet Take 10 mg by mouth at bedtime.   cetirizine (ZYRTEC) 10 MG tablet Take 10 mg by mouth every morning.   cholecalciferol (VITAMIN D)  25 MCG (1000 UNIT) tablet Take 2,000 Units by mouth every evening.   divalproex (DEPAKOTE SPRINKLE) 125 MG capsule Take 125 mg by mouth at bedtime.   ferrous sulfate 325 (65 FE) MG tablet Take 325 mg by mouth daily with breakfast.   folic acid (FOLVITE) 1 MG tablet Take 1 mg by mouth daily.   hydrochlorothiazide (HYDRODIURIL) 25 MG tablet Take 25 mg by mouth in the morning.   lidocaine 4 % Place 1 patch onto the skin daily for 21 days. Apply to left mid axillary   LORazepam (ATIVAN) 0.5 MG tablet Take 0.5 mg by mouth daily. For anxiety and agitation   LORazepam (ATIVAN) 0.5 MG tablet Take 1 tablet (0.5 mg total) by mouth every 12 (twelve) hours as needed.   losartan (COZAAR) 100 MG tablet Take 100 mg by mouth at bedtime.   Melatonin 5 MG/ML LIQD Take by mouth.   methocarbamol (ROBAXIN) 500 MG tablet Take 250 mg by mouth every 6 (six) hours as needed for muscle spasms.   metoprolol succinate (TOPROL-XL) 50 MG 24 hr tablet Take 50 mg by mouth every evening. Take with or immediately following a meal.   nitroGLYCERIN (NITROSTAT) 0.4 MG SL tablet Place 0.4 mg under the tongue every 5 (five) minutes as needed for chest pain. As needed fo chest pain   OLANZapine (ZYPREXA) 5 MG tablet Take 5 mg by mouth in the morning and at bedtime.   oxyCODONE-acetaminophen (PERCOCET/ROXICET) 5-325 MG tablet Take 1 tablet by mouth every 12 (twelve) hours as needed for up to 14 days for severe pain.   pantoprazole (PROTONIX) 40 MG tablet Take 40 mg by mouth daily.   Polyethylene Glycol 3350 (PEG 3350) 17 GM/SCOOP POWD Give 17 gram by mouth one time a day every 2 day(s) for constipation   potassium chloride (KLOR-CON M) 10 MEQ tablet Take 10 mEq by mouth daily.   senna (SENOKOT) 8.6 MG TABS tablet Take 1 tablet by mouth in the morning.   sertraline (ZOLOFT) 25 MG tablet Take 50 mg by mouth at bedtime.   No facility-administered encounter medications on file as of 12/07/2022.    Review of Systems  Unable to perform  ROS: Dementia  Constitutional:  Negative for activity change and appetite change.  HENT: Negative.    Respiratory:  Negative for cough and shortness of breath.   Cardiovascular:  Negative for leg swelling.  Gastrointestinal:  Negative for constipation.  Genitourinary: Negative.   Musculoskeletal:  Positive for gait problem. Negative for arthralgias and myalgias.  Skin: Negative.   Neurological:  Positive for weakness. Negative for dizziness.  Psychiatric/Behavioral:  Positive for behavioral problems and confusion. Negative for dysphoric mood and sleep disturbance.     Immunization History  Administered Date(s) Administered   Fluad Quad(high Dose 65+) 10/18/2022   Influenza Split 05/13/2009, 05/18/2010, 09/29/2011, 09/05/2012, 09/03/2013   Influenza, High Dose Seasonal PF 09/09/2015, 08/30/2017   Influenza, Quadrivalent, Recombinant, Inj, Pf 08/19/2018, 09/29/2019   Influenza-Unspecified 10/18/2021   Moderna Covid-19 Vaccine Bivalent Booster 47yr & up 09/14/2021, 06/07/2022   Moderna SARS-COV2 Booster Vaccination 11/08/2020, 06/01/2021   Moderna Sars-Covid-2 Vaccination 12/29/2019, 01/26/2020   PFIZER(Purple Top)SARS-COV-2 Vaccination 10/31/2022   Pfizer Covid-19 Vaccine Bivalent Booster 133yr& up 09/14/2020   Pneumococcal Conjugate-13 07/08/2013   Pneumococcal Polysaccharide-23 06/29/2006, 05/13/2009, 05/18/2010, 05/25/2011   Td 05/13/2009, 05/18/2010, 05/25/2011   Tdap 03/20/2022   Zoster Recombinat (Shingrix) 08/24/2017, 10/31/2017   Zoster, Live 08/22/2008, 08/24/2008, 05/13/2009, 05/18/2010, 05/25/2011   Pertinent  Health Maintenance Due  Topic Date Due   INFLUENZA VACCINE  Completed   DEXA SCAN  Completed      09/08/2022    3:52 AM 09/09/2022   10:24 PM 12/01/2022   11:44 AM 12/07/2022   10:23 AM 12/07/2022   10:24 AM  Fall Risk  Falls in the past year?   '1 1 1  '$ Was there an injury with Fall?   0 0 0  Fall Risk Category Calculator   '1 1 1  '$ Fall Risk Category   Low  Low Low  Patient Fall Risk Level Moderate fall risk High fall risk High fall risk Low fall risk Low fall risk  Patient at Risk for Falls Due to   History of fall(s) History of fall(s)   Fall risk Follow up   Falls evaluation completed Falls evaluation completed    Functional Status Survey:    Vitals:   12/07/22 0943  BP: 102/62  Pulse: (!) 59  Resp: 17  Temp: (!) 97.2 F (36.2 C)  TempSrc: Temporal  SpO2: (!) 88%  Weight: 120 lb 8 oz (54.7 kg)  Height: 5' (1.524 m)   Body mass index is 23.53 kg/m. Physical Exam Vitals reviewed.  Constitutional:      Appearance: Normal appearance.  HENT:     Head: Normocephalic.     Nose: Nose normal.     Mouth/Throat:     Mouth: Mucous membranes are moist.     Pharynx: Oropharynx is clear.  Eyes:     Pupils: Pupils are equal, round, and reactive to light.  Cardiovascular:     Rate and Rhythm: Normal rate and regular rhythm.     Pulses: Normal pulses.     Heart sounds: Normal heart sounds. No murmur heard. Pulmonary:     Effort: Pulmonary effort is normal.     Breath sounds: Normal breath sounds.  Abdominal:     General: Abdomen is flat. Bowel sounds are normal.     Palpations: Abdomen is soft.  Musculoskeletal:        General: No swelling.     Cervical back: Neck supple.  Skin:    General: Skin is warm.  Neurological:     Mental Status: She is alert.     Comments: More Sleepy today  Psychiatric:        Mood and Affect: Mood normal.        Thought Content: Thought content normal.     Labs reviewed: Recent Labs    02/11/22 0108 06/05/22 0000 09/08/22 0422 09/11/22 0730  NA 138 142 134* 135  K 3.0* 3.9 3.2* 3.4*  CL 106 107 101 96*  CO2 24 29* 25 29  GLUCOSE 110*  --  106* 67  BUN  $'11 16 10 16  'k$ CREATININE 0.64 0.5 0.38* 0.62  CALCIUM 8.3* 8.7 8.9 9.0   Recent Labs    02/09/22 0000 02/10/22 1303 09/11/22 0730  AST '23 28 18  '$ ALT '22 25 11  '$ ALKPHOS 90 97  --   BILITOT  --  0.5 0.6  PROT  --  6.3* 6.6   ALBUMIN 3.7 3.2*  --    Recent Labs    02/11/22 0108 02/20/22 0000 03/09/22 0000 06/05/22 0000 09/08/22 0422 09/11/22 0730  WBC 5.8   < > 3.7 4.2 6.7 5.5  NEUTROABS 4.1   < > 1,550.00  --  5.0 3,801  HGB 8.0*   < > 10.7* 12.5 14.1 14.5  HCT 26.6*   < > 36 39 43.2 42.1  MCV 80.4  --   --   --  95.6 89.6  PLT 216   < > 266 209 220 236   < > = values in this interval not displayed.   Lab Results  Component Value Date   TSH 2.90 11/24/2021   No results found for: "HGBA1C" Lab Results  Component Value Date   CHOL 86 01/23/2022   HDL 43 01/23/2022   LDLCALC 27 01/23/2022   TRIG 77 01/23/2022   CHOLHDL 1.9 09/17/2020    Significant Diagnostic Results in last 30 days:  No results found.  Assessment/Plan 1. Closed fracture of multiple ribs of left side, sequela Pain Seems Controlled on Percocet  2. Moderate late onset Alzheimer's dementia with other behavioral disturbance ([facility]) Zyprexa and Depakote Hepatic function looks good  3. Anxiety and depression Zoloft and Ativan PRn  4. Primary hypertension On Metoprolol and Amlodipine and HCTZ  5. PAF (paroxysmal atrial fibrillation) ([place]) Not on any anticoagulation due to GI bleed and Falls  6. Iron deficiency anemia, unspecified iron deficiency anemia type On Iron Refused work up 7 HLD On statin Last LDL 23 Dose was reduced for Lipitor Will repeat Lipid panel 8 Anemia On Iron  Repeat CBC on Depakote 9 Insomnia Was very sleepy on melatonin 5 mg Will reduce it to 3 mg  Family/ staff Communication:   Labs/tests ordered:  CBC,Lipid panel

## 2022-12-09 DIAGNOSIS — M545 Low back pain, unspecified: Secondary | ICD-10-CM | POA: Diagnosis not present

## 2022-12-09 DIAGNOSIS — M6281 Muscle weakness (generalized): Secondary | ICD-10-CM | POA: Diagnosis not present

## 2022-12-09 DIAGNOSIS — R4189 Other symptoms and signs involving cognitive functions and awareness: Secondary | ICD-10-CM | POA: Diagnosis not present

## 2022-12-09 DIAGNOSIS — Z9181 History of falling: Secondary | ICD-10-CM | POA: Diagnosis not present

## 2022-12-09 DIAGNOSIS — I4891 Unspecified atrial fibrillation: Secondary | ICD-10-CM | POA: Diagnosis not present

## 2022-12-09 DIAGNOSIS — R41841 Cognitive communication deficit: Secondary | ICD-10-CM | POA: Diagnosis not present

## 2022-12-11 ENCOUNTER — Encounter: Payer: Self-pay | Admitting: Orthopedic Surgery

## 2022-12-11 ENCOUNTER — Non-Acute Institutional Stay (SKILLED_NURSING_FACILITY): Payer: Medicare Other | Admitting: Orthopedic Surgery

## 2022-12-11 DIAGNOSIS — M545 Low back pain, unspecified: Secondary | ICD-10-CM | POA: Diagnosis not present

## 2022-12-11 DIAGNOSIS — R41841 Cognitive communication deficit: Secondary | ICD-10-CM | POA: Diagnosis not present

## 2022-12-11 DIAGNOSIS — G301 Alzheimer's disease with late onset: Secondary | ICD-10-CM | POA: Diagnosis not present

## 2022-12-11 DIAGNOSIS — S2242XS Multiple fractures of ribs, left side, sequela: Secondary | ICD-10-CM

## 2022-12-11 DIAGNOSIS — R296 Repeated falls: Secondary | ICD-10-CM

## 2022-12-11 DIAGNOSIS — F02B18 Dementia in other diseases classified elsewhere, moderate, with other behavioral disturbance: Secondary | ICD-10-CM | POA: Diagnosis not present

## 2022-12-11 DIAGNOSIS — R4189 Other symptoms and signs involving cognitive functions and awareness: Secondary | ICD-10-CM | POA: Diagnosis not present

## 2022-12-11 DIAGNOSIS — Z9181 History of falling: Secondary | ICD-10-CM | POA: Diagnosis not present

## 2022-12-11 DIAGNOSIS — M6281 Muscle weakness (generalized): Secondary | ICD-10-CM | POA: Diagnosis not present

## 2022-12-11 DIAGNOSIS — I4891 Unspecified atrial fibrillation: Secondary | ICD-10-CM | POA: Diagnosis not present

## 2022-12-11 NOTE — Progress Notes (Signed)
Location:  [place] Room Number: 30A Place of Service:  SNF [phone]) Provider:  Windell Moulding, NP   Patient Care Team: [name], MD as PCP - General (Internal Medicine) Martinique, Peter M, MD as PCP - Cardiology (Cardiology)  Extended Emergency Contact Information Primary Emergency Contact: [name],[name] Address: [address]          [place] [address] [place] Phone: [phone] Mobile Phone: [phone] Relation: Sister Secondary Emergency Contact: [name],[name]  [name] of [place] Phone: [phone] Mobile Phone: [phone] Relation: Niece  Code Status:  DNR Goals of care: Advanced Directive information    12/11/2022   10:46 AM  Advanced Directives  Does Patient Have a Medical Advance Directive? Yes  Type of Paramedic of [place];Living will;Out of facility DNR (pink MOST or yellow form)  Does patient want to make changes to medical advance directive? No - Patient declined  Copy of [place] in Chart? Yes - validated most recent copy scanned in chart (See row information)  Pre-existing out of facility DNR order (yellow form or pink MOST form) Pink MOST form placed in chart (order not valid for inpatient use)     Chief Complaint  Patient presents with   Acute Visit    Acute Visit with provider on site at [facility] for insomina    HPI:  Pt is a 88 y.o. female seen today for an acute visit for insomnia/increased sundowning.  She currently resides on the skilled living unit at [facility]. PMH: aortic stenosis,CAD, HTN, HLD, atrial fib, emphysema, GERD, vascular dementia, and depression.      Increased agitation, sundowning and falls within the past 2 weeks.12/05 she had a unwitnessed fall. CXR revealed left rib fractures to 3rd/10th ribs. Recent urine culture negative for growth. She has been taking percocet prn for rib pain.   H/o  mechanical fall 08/2022, resulting in right 9th rib fracture and compression fractures to T3/T7/T11. She was moved to [facility] 10/05/2022.   Today, nursing reports increased insomnia at night with mild agitation/noncompliance. She is able to express needs and follow commands. After our encounter, she tried to get up on her own and was found on the floor. No apparent injury. She is on Depakote, Zyprexa, Zoloft and Ativan prn. Melatonin also added for sleep. 12/07/2022.     Past Medical History:  Diagnosis Date   Anxiety and depression    Aortic stenosis    a. 03/2014: Mild aortic stenosis by valve area and mean gradient though appeared more visually consistent with moderate AS.    CAD (coronary artery disease)    a. s/p prior PCI of D2 in 1992 b. stent to RCA in 2005   Diverticula of colon    GERD (gastroesophageal reflux disease)    HTN (hypertension)    Hypercholesteremia    Lateral myocardial infarction ([place]) 1992   Osteoarthritis    Osteopenia    Scoliosis    Past Surgical History:  Procedure Laterality Date   ABDOMINAL HYSTERECTOMY     APPENDECTOMY     CORONARY ANGIOPLASTY     TONSILLECTOMY      Allergies  Allergen Reactions   Latex Other (See Comments)    Unknown reaction - listed on [facility] 02/10/22   Nifedipine Other (See Comments)    Dark Urine; can only take orange tablet, allergic to yellow-brown tablet   Tetanus Toxoids Other (See Comments)    Unknown reaction  Tetanus-Diphtheria Toxoids Td Other (See Comments)    Unknown reaction   Adhesive [Tape] Rash    Outpatient Encounter Medications as of 12/11/2022  Medication Sig   acetaminophen (TYLENOL) 500 MG tablet Take 500 mg by mouth in the morning, at noon, and at bedtime.   amLODipine (NORVASC) 5 MG tablet Take 5 mg by mouth daily.   atorvastatin (LIPITOR) 10 MG tablet Take 10 mg by mouth at bedtime.   cetirizine (ZYRTEC) 10 MG tablet Take 10 mg by mouth every morning.   cholecalciferol (VITAMIN D) 25 MCG (1000 UNIT)  tablet Take 2,000 Units by mouth every evening.   divalproex (DEPAKOTE SPRINKLE) 125 MG capsule Take 125 mg by mouth at bedtime.   ferrous sulfate 325 (65 FE) MG tablet Take 325 mg by mouth daily with breakfast.   folic acid (FOLVITE) 1 MG tablet Take 1 mg by mouth daily.   hydrochlorothiazide (HYDRODIURIL) 25 MG tablet Take 25 mg by mouth in the morning.   lidocaine 4 % Place 1 patch onto the skin daily for 21 days. Apply to left mid axillary   LORazepam (ATIVAN) 0.5 MG tablet Take 0.5 mg by mouth daily. For anxiety and agitation   LORazepam (ATIVAN) 0.5 MG tablet Take 1 tablet (0.5 mg total) by mouth every 12 (twelve) hours as needed.   losartan (COZAAR) 100 MG tablet Take 100 mg by mouth at bedtime.   melatonin 3 MG TABS tablet Take 3 mg by mouth at bedtime.   methocarbamol (ROBAXIN) 500 MG tablet Take 250 mg by mouth every 6 (six) hours as needed for muscle spasms.   metoprolol succinate (TOPROL-XL) 50 MG 24 hr tablet Take 50 mg by mouth every evening. Take with or immediately following a meal.   nitroGLYCERIN (NITROSTAT) 0.4 MG SL tablet Place 0.4 mg under the tongue every 5 (five) minutes as needed for chest pain. As needed fo chest pain   OLANZapine (ZYPREXA) 5 MG tablet Take 5 mg by mouth in the morning and at bedtime.   oxyCODONE-acetaminophen (PERCOCET/ROXICET) 5-325 MG tablet Take 1 tablet by mouth every 12 (twelve) hours as needed for up to 14 days for severe pain.   pantoprazole (PROTONIX) 40 MG tablet Take 40 mg by mouth daily.   Polyethylene Glycol 3350 (PEG 3350) 17 GM/SCOOP POWD Give 17 gram by mouth one time a day every 2 day(s) for constipation   potassium chloride (KLOR-CON M) 10 MEQ tablet Take 10 mEq by mouth daily.   senna (SENOKOT) 8.6 MG TABS tablet Take 1 tablet by mouth in the morning.   sertraline (ZOLOFT) 25 MG tablet Take 50 mg by mouth at bedtime.   No facility-administered encounter medications on file as of 12/11/2022.    Review of Systems  Unable to perform  ROS: Dementia    Immunization History  Administered Date(s) Administered   Fluad Quad(high Dose 65+) 10/18/2022   Influenza Split 05/13/2009, 05/18/2010, 09/29/2011, 09/05/2012, 09/03/2013   Influenza, High Dose Seasonal PF 09/09/2015, 08/30/2017   Influenza, Quadrivalent, Recombinant, Inj, Pf 08/19/2018, 09/29/2019   Influenza-Unspecified 10/18/2021   Moderna Covid-19 Vaccine Bivalent Booster 66yr & up 09/14/2021, 06/07/2022   Moderna SARS-COV2 Booster Vaccination 11/08/2020, 06/01/2021   Moderna Sars-Covid-2 Vaccination 12/29/2019, 01/26/2020   PFIZER(Purple Top)SARS-COV-2 Vaccination 10/31/2022   Pfizer Covid-19 Vaccine Bivalent Booster 156yr& up 09/14/2020   Pneumococcal Conjugate-13 07/08/2013   Pneumococcal Polysaccharide-23 06/29/2006, 05/13/2009, 05/18/2010, 05/25/2011   Td 05/13/2009, 05/18/2010, 05/25/2011   Tdap 03/20/2022   Zoster Recombinat (Shingrix) 08/24/2017, 10/31/2017  Zoster, Live 08/22/2008, 08/24/2008, 05/13/2009, 05/18/2010, 05/25/2011   Pertinent  Health Maintenance Due  Topic Date Due   INFLUENZA VACCINE  Completed   DEXA SCAN  Completed      09/09/2022   10:24 PM 12/01/2022   11:44 AM 12/07/2022   10:23 AM 12/07/2022   10:24 AM 12/11/2022   10:46 AM  Fall Risk  Falls in the past year?  '1 1 1 1  '$ Was there an injury with Fall?  0 0 0 0  Fall Risk Category Calculator  '1 1 1 1  '$ Fall Risk Category  Low Low Low Low  Patient Fall Risk Level High fall risk High fall risk Low fall risk Low fall risk Low fall risk  Patient at Risk for Falls Due to  History of fall(s) History of fall(s)  History of fall(s)  Fall risk Follow up  Falls evaluation completed Falls evaluation completed  Falls evaluation completed   Functional Status Survey:    Vitals:   12/11/22 1040  BP: 102/62  Pulse: 63  Resp: 17  Temp: (!) 97.2 F (36.2 C)  SpO2: 93%  Weight: 120 lb 5 oz (54.6 kg)  Height: 5' (1.524 m)   Body mass index is 23.5 kg/m. Physical Exam Vitals  reviewed.  Constitutional:      General: She is not in acute distress. HENT:     Head: Normocephalic and atraumatic.     Right Ear: There is no impacted cerumen.     Left Ear: There is no impacted cerumen.     Nose: Nose normal.     Mouth/Throat:     Mouth: Mucous membranes are moist.  Eyes:     General:        Right eye: No discharge.        Left eye: No discharge.  Cardiovascular:     Rate and Rhythm: Normal rate and regular rhythm.     Pulses: Normal pulses.     Heart sounds: Normal heart sounds.  Pulmonary:     Effort: Pulmonary effort is normal. No respiratory distress.     Breath sounds: Normal breath sounds. No wheezing.  Abdominal:     General: Bowel sounds are normal. There is no distension.     Palpations: Abdomen is soft.     Tenderness: There is no abdominal tenderness.  Musculoskeletal:     Cervical back: Neck supple.     Right lower leg: No edema.     Left lower leg: No edema.     Comments: Able to move extremities without difficulty/pain  Skin:    General: Skin is warm and dry.     Capillary Refill: Capillary refill takes less than 2 seconds.  Neurological:     General: No focal deficit present.     Mental Status: She is alert. Mental status is at baseline.     Motor: Weakness present.     Gait: Gait abnormal.     Comments: wheelchair  Psychiatric:        Mood and Affect: Mood normal.        Behavior: Behavior normal.     Labs reviewed: Recent Labs    02/11/22 0108 06/05/22 0000 09/08/22 0422 09/11/22 0730 11/02/22 0000  NA 138   < > 134* 135 141  K 3.0*   < > 3.2* 3.4* 3.8  CL 106   < > 101 96* 104  CO2 24   < > 25 29 31*  GLUCOSE 110*  --  106* 67  --   BUN 11   < > '10 16 11  '$ CREATININE 0.64   < > 0.38* 0.62 0.4*  CALCIUM 8.3*   < > 8.9 9.0 8.7   < > = values in this interval not displayed.   Recent Labs    02/09/22 0000 02/10/22 1303 09/11/22 0730 11/02/22 0000  AST '23 28 18 16  '$ ALT '22 25 11 10  '$ ALKPHOS 90 97  --  128*  BILITOT   --  0.5 0.6  --   PROT  --  6.3* 6.6  --   ALBUMIN 3.7 3.2*  --  3.6   Recent Labs    02/11/22 0108 02/20/22 0000 03/09/22 0000 06/05/22 0000 09/08/22 0422 09/11/22 0730 11/02/22 0000  WBC 5.8   < > 3.7   < > 6.7 5.5 4.3  NEUTROABS 4.1   < > 1,550.00  --  5.0 3,801  --   HGB 8.0*   < > 10.7*   < > 14.1 14.5 13.4  HCT 26.6*   < > 36   < > 43.2 42.1 41  MCV 80.4  --   --   --  95.6 89.6  --   PLT 216   < > 266   < > 220 236 209   < > = values in this interval not displayed.   Lab Results  Component Value Date   TSH 2.90 11/24/2021   No results found for: "HGBA1C" Lab Results  Component Value Date   CHOL 86 01/23/2022   HDL 43 01/23/2022   LDLCALC 27 01/23/2022   TRIG 77 01/23/2022   CHOLHDL 1.9 09/17/2020    Significant Diagnostic Results in last 30 days:  No results found.  Assessment/Plan 1. Moderate late onset Alzheimer's dementia with other behavioral disturbance ([place]) - ongoing - increased sundowning, falls, agitation/noncompliance within past 2 weeks - recent urine culture negative for growth - will increase Depakote 125 mg to BID - hepatic panel in 2 weeks - cont Zyprexa, Zoloft and Ativan prn  2. Closed fracture of multiple ribs of left side, sequela - 12/05 unwitnessed fall - CXR revealed left rib fractures to 3rd/10th ribs - cont percocet prn  3. Frequent falls - ongoing, poor safety awareness due to dementia - now lives in skilled  - cont skilled nursing care    Family/ staff Communication: plan discussed with patient and nurse  Labs/tests ordered:  hepatic panel in 2 weeks

## 2022-12-13 DIAGNOSIS — M6281 Muscle weakness (generalized): Secondary | ICD-10-CM | POA: Diagnosis not present

## 2022-12-13 DIAGNOSIS — M545 Low back pain, unspecified: Secondary | ICD-10-CM | POA: Diagnosis not present

## 2022-12-13 DIAGNOSIS — I4891 Unspecified atrial fibrillation: Secondary | ICD-10-CM | POA: Diagnosis not present

## 2022-12-13 DIAGNOSIS — R4189 Other symptoms and signs involving cognitive functions and awareness: Secondary | ICD-10-CM | POA: Diagnosis not present

## 2022-12-13 DIAGNOSIS — R41841 Cognitive communication deficit: Secondary | ICD-10-CM | POA: Diagnosis not present

## 2022-12-13 DIAGNOSIS — Z9181 History of falling: Secondary | ICD-10-CM | POA: Diagnosis not present

## 2022-12-14 DIAGNOSIS — Z9181 History of falling: Secondary | ICD-10-CM | POA: Diagnosis not present

## 2022-12-14 DIAGNOSIS — M545 Low back pain, unspecified: Secondary | ICD-10-CM | POA: Diagnosis not present

## 2022-12-14 DIAGNOSIS — R41841 Cognitive communication deficit: Secondary | ICD-10-CM | POA: Diagnosis not present

## 2022-12-14 DIAGNOSIS — R4189 Other symptoms and signs involving cognitive functions and awareness: Secondary | ICD-10-CM | POA: Diagnosis not present

## 2022-12-14 DIAGNOSIS — M6281 Muscle weakness (generalized): Secondary | ICD-10-CM | POA: Diagnosis not present

## 2022-12-14 DIAGNOSIS — I4891 Unspecified atrial fibrillation: Secondary | ICD-10-CM | POA: Diagnosis not present

## 2022-12-15 DIAGNOSIS — R4189 Other symptoms and signs involving cognitive functions and awareness: Secondary | ICD-10-CM | POA: Diagnosis not present

## 2022-12-15 DIAGNOSIS — M545 Low back pain, unspecified: Secondary | ICD-10-CM | POA: Diagnosis not present

## 2022-12-15 DIAGNOSIS — I4891 Unspecified atrial fibrillation: Secondary | ICD-10-CM | POA: Diagnosis not present

## 2022-12-15 DIAGNOSIS — M6281 Muscle weakness (generalized): Secondary | ICD-10-CM | POA: Diagnosis not present

## 2022-12-15 DIAGNOSIS — R41841 Cognitive communication deficit: Secondary | ICD-10-CM | POA: Diagnosis not present

## 2022-12-15 DIAGNOSIS — Z9181 History of falling: Secondary | ICD-10-CM | POA: Diagnosis not present

## 2022-12-20 DIAGNOSIS — I4891 Unspecified atrial fibrillation: Secondary | ICD-10-CM | POA: Diagnosis not present

## 2022-12-20 DIAGNOSIS — M545 Low back pain, unspecified: Secondary | ICD-10-CM | POA: Diagnosis not present

## 2022-12-20 DIAGNOSIS — R4189 Other symptoms and signs involving cognitive functions and awareness: Secondary | ICD-10-CM | POA: Diagnosis not present

## 2022-12-20 DIAGNOSIS — M6281 Muscle weakness (generalized): Secondary | ICD-10-CM | POA: Diagnosis not present

## 2022-12-20 DIAGNOSIS — Z9181 History of falling: Secondary | ICD-10-CM | POA: Diagnosis not present

## 2022-12-20 DIAGNOSIS — R41841 Cognitive communication deficit: Secondary | ICD-10-CM | POA: Diagnosis not present

## 2022-12-21 ENCOUNTER — Other Ambulatory Visit: Payer: Self-pay | Admitting: Orthopedic Surgery

## 2022-12-21 DIAGNOSIS — D649 Anemia, unspecified: Secondary | ICD-10-CM | POA: Diagnosis not present

## 2022-12-21 DIAGNOSIS — E785 Hyperlipidemia, unspecified: Secondary | ICD-10-CM | POA: Diagnosis not present

## 2022-12-21 DIAGNOSIS — S2242XA Multiple fractures of ribs, left side, initial encounter for closed fracture: Secondary | ICD-10-CM

## 2022-12-21 LAB — LIPID PANEL
Cholesterol: 165 (ref 0–200)
HDL: 47 (ref 35–70)
LDL Cholesterol: 88
LDl/HDL Ratio: 3.5
Triglycerides: 206 — AB (ref 40–160)

## 2022-12-21 LAB — CBC AND DIFFERENTIAL
HCT: 42 (ref 36–46)
Hemoglobin: 14.1 (ref 12.0–16.0)
Platelets: 260 10*3/uL (ref 150–400)
WBC: 5.5

## 2022-12-21 LAB — CBC: RBC: 4.6 (ref 3.87–5.11)

## 2023-01-01 ENCOUNTER — Encounter: Payer: Self-pay | Admitting: Orthopedic Surgery

## 2023-01-01 ENCOUNTER — Non-Acute Institutional Stay (SKILLED_NURSING_FACILITY): Payer: Medicare Other | Admitting: Orthopedic Surgery

## 2023-01-01 DIAGNOSIS — R2681 Unsteadiness on feet: Secondary | ICD-10-CM | POA: Diagnosis not present

## 2023-01-01 DIAGNOSIS — I10 Essential (primary) hypertension: Secondary | ICD-10-CM

## 2023-01-01 DIAGNOSIS — F419 Anxiety disorder, unspecified: Secondary | ICD-10-CM | POA: Diagnosis not present

## 2023-01-01 DIAGNOSIS — I5189 Other ill-defined heart diseases: Secondary | ICD-10-CM | POA: Diagnosis not present

## 2023-01-01 DIAGNOSIS — D692 Other nonthrombocytopenic purpura: Secondary | ICD-10-CM

## 2023-01-01 DIAGNOSIS — K5901 Slow transit constipation: Secondary | ICD-10-CM | POA: Diagnosis not present

## 2023-01-01 DIAGNOSIS — S2242XS Multiple fractures of ribs, left side, sequela: Secondary | ICD-10-CM | POA: Diagnosis not present

## 2023-01-01 DIAGNOSIS — G301 Alzheimer's disease with late onset: Secondary | ICD-10-CM

## 2023-01-01 DIAGNOSIS — I480 Paroxysmal atrial fibrillation: Secondary | ICD-10-CM | POA: Diagnosis not present

## 2023-01-01 DIAGNOSIS — D509 Iron deficiency anemia, unspecified: Secondary | ICD-10-CM

## 2023-01-01 DIAGNOSIS — E7800 Pure hypercholesterolemia, unspecified: Secondary | ICD-10-CM | POA: Diagnosis not present

## 2023-01-01 DIAGNOSIS — F02B18 Dementia in other diseases classified elsewhere, moderate, with other behavioral disturbance: Secondary | ICD-10-CM

## 2023-01-01 DIAGNOSIS — F32A Depression, unspecified: Secondary | ICD-10-CM

## 2023-01-01 DIAGNOSIS — I2583 Coronary atherosclerosis due to lipid rich plaque: Secondary | ICD-10-CM

## 2023-01-01 DIAGNOSIS — I2510 Atherosclerotic heart disease of native coronary artery without angina pectoris: Secondary | ICD-10-CM

## 2023-01-01 NOTE — Progress Notes (Signed)
Location:  [place] Room Number: 30A Place of Service:  SNF [phone]) Provider:  Windell Moulding, NP   Patient Care Team: [name], MD as PCP - General (Internal Medicine) [place], Peter M, MD as PCP - Cardiology (Cardiology)  Extended Emergency Contact Information Primary Emergency Contact: [name],[name] Address: [address]          [place] [address] [place] Phone: [phone] Mobile Phone: [phone] Relation: Sister Secondary Emergency Contact: [name],[name]  [name] of [place] Phone: [phone] Mobile Phone: [phone] Relation: Niece  Code Status:  DNR Goals of care: Advanced Directive information    01/01/2023   10:39 AM  Advanced Directives  Does Patient Have a Medical Advance Directive? Yes  Type of Paramedic of [place];Living will;Out of facility DNR (pink MOST or yellow form)  Does patient want to make changes to medical advance directive? No - Patient declined  Copy of [place] in Chart? Yes - validated most recent copy scanned in chart (See row information)  Pre-existing out of facility DNR order (yellow form or pink MOST form) Pink MOST form placed in chart (order not valid for inpatient use)     Chief Complaint  Patient presents with   Medical Management of Chronic Issues    Routine Visit    HPI:  Pt is a 89 y.o. female seen today for medical management of chronic diseases.    She currently resides on the skilled living unit at [facility]. PMH: aortic stenosis,CAD, HTN, HLD, atrial fib, emphysema, GERD, vascular dementia, and depression.     Rib fracture- 12/05 unwitnessed fall, CXR revealed left rib fracture to 3rd/10th ribs, denies pain today, remains on scheduled tylenol and robaxin prn Dementia- 04/2021 CT head noted chronic vascular changes, MMSE 16/30, behavioral outbursts in evening, remains on Depakote, ativan, Zyprexa and  Sertraline Unstable gait- multiples falls with fractures within last 4 months, compression fracture T3/T7/T11 09/13/2022, poor safety awareness due to dementia HTN- BUN/creat 11/0.4 11/02/2022, remains on amlodipine, HCTZ, losartan, and metoprolol Grade I diastolic dysfunction- 60/6301 echo LVEF 65-70% PAF- HR controlled with metoprolol, remains on asa for clot prevention CAD- hx demand ischemia, stent placement 1992 & 2005, remains on asa, metoprolol and statin HLD- LDL 88 12/21/2022, remains on statin Anemia- H/o + hemoccults 01/2022, hgb 14.1 12/21/2022, remains on ferrous sulfate and Protonix Depression- no mood changes, Na+ 141 11/02/2022, remains on sertraline Constipation- remains on senna and miralax  Recent blood pressures:  01/02- 125/77  12/26- 128/64  12/19- 110/60  Recent weights:  01/01- not recorded  12/02- 120.5 lbs  11/08- 120.2 lbs  10/11- 120.2 lbs  Past Surgical History:  Procedure Laterality Date   ABDOMINAL HYSTERECTOMY     APPENDECTOMY     CORONARY ANGIOPLASTY     TONSILLECTOMY      Allergies  Allergen Reactions   Latex Other (See Comments)    Unknown reaction - listed on [facility] 02/10/22   Nifedipine Other (See Comments)    Dark Urine; can only take orange tablet, allergic to yellow-brown tablet   Tetanus Toxoids Other (See Comments)    Unknown reaction   Tetanus-Diphtheria Toxoids Td Other (See Comments)    Unknown reaction   Adhesive [Tape] Rash    Outpatient Encounter Medications as of 01/01/2023  Medication Sig   acetaminophen (TYLENOL) 500 MG tablet Take 500 mg by mouth in the morning, at noon, and at bedtime.   amLODipine (NORVASC)  5 MG tablet Take 5 mg by mouth daily.   atorvastatin (LIPITOR) 10 MG tablet Take 10 mg by mouth at bedtime.   cetirizine (ZYRTEC) 10 MG tablet Take 10 mg by mouth every morning.   cholecalciferol (VITAMIN D) 25 MCG (1000 UNIT) tablet Take 2,000 Units by mouth every evening.   divalproex (DEPAKOTE SPRINKLE) 125 MG  capsule Take 125 mg by mouth 2 (two) times daily.   ferrous sulfate 325 (65 FE) MG tablet Take 325 mg by mouth daily with breakfast.   folic acid (FOLVITE) 1 MG tablet Take 1 mg by mouth daily.   hydrochlorothiazide (HYDRODIURIL) 25 MG tablet Take 25 mg by mouth in the morning.   LORazepam (ATIVAN) 0.5 MG tablet Take 0.5 mg by mouth daily. For anxiety and agitation   LORazepam (ATIVAN) 0.5 MG tablet Take 1 tablet (0.5 mg total) by mouth every 12 (twelve) hours as needed.   losartan (COZAAR) 100 MG tablet Take 100 mg by mouth at bedtime.   melatonin 3 MG TABS tablet Take 3 mg by mouth at bedtime.   methocarbamol (ROBAXIN) 500 MG tablet Take 250 mg by mouth every 6 (six) hours as needed for muscle spasms.   metoprolol succinate (TOPROL-XL) 50 MG 24 hr tablet Take 50 mg by mouth every evening. Take with or immediately following a meal.   nitroGLYCERIN (NITROSTAT) 0.4 MG SL tablet Place 0.4 mg under the tongue every 5 (five) minutes as needed for chest pain. As needed fo chest pain   OLANZapine (ZYPREXA) 5 MG tablet Take 5 mg by mouth in the morning and at bedtime.   pantoprazole (PROTONIX) 40 MG tablet Take 40 mg by mouth daily.   Polyethylene Glycol 3350 (PEG 3350) 17 GM/SCOOP POWD Give 17 gram by mouth one time a day every 2 day(s) for constipation   potassium chloride (KLOR-CON M) 10 MEQ tablet Take 10 mEq by mouth daily.   senna (SENOKOT) 8.6 MG TABS tablet Take 1 tablet by mouth in the morning.   sertraline (ZOLOFT) 25 MG tablet Take 50 mg by mouth at bedtime.   No facility-administered encounter medications on file as of 01/01/2023.    Review of Systems  Unable to perform ROS: Dementia    Immunization History  Administered Date(s) Administered   Fluad Quad(high Dose 65+) 10/18/2022   Influenza Split 05/13/2009, 05/18/2010, 09/29/2011, 09/05/2012, 09/03/2013   Influenza, High Dose Seasonal PF 09/09/2015, 08/30/2017   Influenza, Quadrivalent, Recombinant, Inj, Pf 08/19/2018, 09/29/2019    Influenza-Unspecified 10/18/2021   Janssen (J&J) SARS-COV-2 Vaccination 11/20/2022   Moderna Covid-19 Vaccine Bivalent Booster 70yr & up 09/14/2021, 06/07/2022   Moderna SARS-COV2 Booster Vaccination 11/08/2020, 06/01/2021   Moderna Sars-Covid-2 Vaccination 12/29/2019, 01/26/2020   PFIZER(Purple Top)SARS-COV-2 Vaccination 10/31/2022   Pfizer Covid-19 Vaccine Bivalent Booster 131yr& up 09/14/2020   Pneumococcal Conjugate-13 07/08/2013   Pneumococcal Polysaccharide-23 06/29/2006, 05/13/2009, 05/18/2010, 05/25/2011   Td 05/13/2009, 05/18/2010, 05/25/2011   Tdap 03/20/2022   Zoster Recombinat (Shingrix) 08/24/2017, 10/31/2017   Zoster, Live 08/22/2008, 08/24/2008, 05/13/2009, 05/18/2010, 05/25/2011   Pertinent  Health Maintenance Due  Topic Date Due   INFLUENZA VACCINE  Completed   DEXA SCAN  Completed      12/01/2022   11:44 AM 12/07/2022   10:23 AM 12/07/2022   10:24 AM 12/11/2022   10:46 AM 01/01/2023   10:38 AM  Fall Risk  Falls in the past year? '1 1 1 1 '$ 0  Was there an injury with Fall? 0 0 0 0 0  Fall Risk  Category Calculator '1 1 1 1 '$ 0  Fall Risk Category Low Low Low Low Low  Patient Fall Risk Level High fall risk Low fall risk Low fall risk Low fall risk Low fall risk  Patient at Risk for Falls Due to History of fall(s) History of fall(s)  History of fall(s) History of fall(s)  Fall risk Follow up Falls evaluation completed Falls evaluation completed  Falls evaluation completed Falls evaluation completed   Functional Status Survey:    Vitals:   01/01/23 1030  BP: 125/77  Pulse: 70  Resp: (!) 21  Temp: (!) 97.3 F (36.3 C)  SpO2: 98%  Weight: 120 lb 5 oz (54.6 kg)  Height: 5' (1.524 m)   Body mass index is 23.5 kg/m. Physical Exam Vitals reviewed.  Constitutional:      General: She is not in acute distress. HENT:     Head: Normocephalic.     Right Ear: There is no impacted cerumen.     Left Ear: There is no impacted cerumen.     Nose: Nose normal.      Mouth/Throat:     Mouth: Mucous membranes are moist.  Eyes:     General:        Right eye: No discharge.        Left eye: No discharge.  Cardiovascular:     Rate and Rhythm: Normal rate. Rhythm irregular.     Pulses: Normal pulses.     Heart sounds: Murmur heard.  Pulmonary:     Effort: Pulmonary effort is normal. No respiratory distress.     Breath sounds: Normal breath sounds. No wheezing.  Chest:     Chest wall: No deformity or tenderness.  Abdominal:     General: Bowel sounds are normal. There is no distension.     Palpations: Abdomen is soft.     Tenderness: There is no abdominal tenderness.  Musculoskeletal:     Cervical back: Neck supple.     Right lower leg: No edema.     Left lower leg: No edema.  Skin:    General: Skin is warm and dry.     Capillary Refill: Capillary refill takes less than 2 seconds.     Comments: Scattered scabs to BLE, vary in size, CDI, no sign of infection, chronic venous insufficiency to BLE. Non tender purple lesions to extremities, vary in size, no skin breakdown.   Neurological:     General: No focal deficit present.     Mental Status: She is alert. Mental status is at baseline.     Motor: Weakness present.     Gait: Gait abnormal.     Comments: wheelchair  Psychiatric:        Mood and Affect: Mood normal.        Behavior: Behavior normal.     Comments: Very pleasant, follows commands, alert to self/place     Labs reviewed: Recent Labs    02/11/22 0108 06/05/22 0000 09/08/22 0422 09/11/22 0730 11/02/22 0000  NA 138   < > 134* 135 141  K 3.0*   < > 3.2* 3.4* 3.8  CL 106   < > 101 96* 104  CO2 24   < > 25 29 31*  GLUCOSE 110*  --  106* 67  --   BUN 11   < > '10 16 11  '$ CREATININE 0.64   < > 0.38* 0.62 0.4*  CALCIUM 8.3*   < > 8.9 9.0 8.7   < > =  values in this interval not displayed.   Recent Labs    02/09/22 0000 02/10/22 1303 09/11/22 0730 11/02/22 0000  AST '23 28 18 16  '$ ALT '22 25 11 10  '$ ALKPHOS 90 97  --  128*   BILITOT  --  0.5 0.6  --   PROT  --  6.3* 6.6  --   ALBUMIN 3.7 3.2*  --  3.6   Recent Labs    02/11/22 0108 02/20/22 0000 03/09/22 0000 06/05/22 0000 09/08/22 0422 09/11/22 0730 11/02/22 0000 12/21/22 0840  WBC 5.8   < > 3.7   < > 6.7 5.5 4.3 5.5  NEUTROABS 4.1   < > 1,550.00  --  5.0 3,801  --   --   HGB 8.0*   < > 10.7*   < > 14.1 14.5 13.4 14.1  HCT 26.6*   < > 36   < > 43.2 42.1 41 42  MCV 80.4  --   --   --  95.6 89.6  --   --   PLT 216   < > 266   < > 220 236 209 260   < > = values in this interval not displayed.   Lab Results  Component Value Date   TSH 2.90 11/24/2021   No results found for: "HGBA1C" Lab Results  Component Value Date   CHOL 165 12/21/2022   HDL 47 12/21/2022   LDLCALC 88 12/21/2022   TRIG 206 (A) 12/21/2022   CHOLHDL 1.9 09/17/2020    Significant Diagnostic Results in last 30 days:  No results found.  Assessment/Plan 1. Closed fracture of multiple ribs of left side, sequela - 12/05 unwitnessed fall  - denies pain - no tenderness to left/right chest wall  - cont tylenol  2. Moderate late onset Alzheimer's dementia with other behavioral disturbance ([place]) - behaviors in evening> sundowning - ambulates with wheelchair - cont Depakote, ativan, Zyprexa and Sertraline  3. Unstable gait - poor safety awareness due to dementia> forgets to use call bell - cont skilled nursing  4. Primary hypertension - controlled - cont amlodipine  5. Grade I diastolic dysfunction  6. PAF (paroxysmal atrial fibrillation) (HCC) - HR < 100 - cont metoprolol - off anticoagulation due to falls  7. Coronary artery disease due to lipid rich plaque - cont statin  8. Hypercholesteremia - cont statin  9. Iron deficiency anemia, unspecified iron deficiency anemia type - hgb stable - cont ferrous sulfate  10. Anxiety and depression - no mood changes - cont zoloft  11. Slow transit constipation - abdomen soft - cont senna and miralax  12.  Senile purpura ([place]) - education given to patient and staff    Family/ staff Communication: plan discussed with patient and nurse  Labs/tests ordered:  none

## 2023-01-02 ENCOUNTER — Other Ambulatory Visit: Payer: Self-pay | Admitting: Adult Health

## 2023-01-02 DIAGNOSIS — G301 Alzheimer's disease with late onset: Secondary | ICD-10-CM

## 2023-01-02 DIAGNOSIS — F419 Anxiety disorder, unspecified: Secondary | ICD-10-CM

## 2023-01-02 MED ORDER — LORAZEPAM 0.5 MG PO TABS: 0.5000 mg | ORAL_TABLET | Freq: Two times a day (BID) | ORAL | 0 refills | Status: DC | PRN

## 2023-01-22 ENCOUNTER — Non-Acute Institutional Stay (SKILLED_NURSING_FACILITY): Payer: Medicare Other | Admitting: Orthopedic Surgery

## 2023-01-22 ENCOUNTER — Encounter: Payer: Self-pay | Admitting: Orthopedic Surgery

## 2023-01-22 DIAGNOSIS — F02B18 Dementia in other diseases classified elsewhere, moderate, with other behavioral disturbance: Secondary | ICD-10-CM

## 2023-01-22 DIAGNOSIS — M25552 Pain in left hip: Secondary | ICD-10-CM | POA: Diagnosis not present

## 2023-01-22 DIAGNOSIS — R2681 Unsteadiness on feet: Secondary | ICD-10-CM

## 2023-01-22 DIAGNOSIS — G301 Alzheimer's disease with late onset: Secondary | ICD-10-CM | POA: Diagnosis not present

## 2023-01-22 NOTE — Progress Notes (Signed)
Location:  [facility] Room Number: 30/A Place of Service:  SNF [phone]) Provider:  [name], NP   [name], MD  Patient Care Team: [name], MD as PCP - General (Internal Medicine) Martinique, Peter M, MD as PCP - Cardiology (Cardiology)  Extended Emergency Contact Information Primary Emergency Contact: [name],[name] Address: [address]          [place] [address] [name] of [place] Phone: [phone] Mobile Phone: [phone] Relation: Sister Secondary Emergency Contact: [name],[name]  [name] of [place] Phone: [phone] Mobile Phone: [phone] Relation: Niece  Code Status:  DNR Goals of care: Advanced Directive information    01/01/2023   10:39 AM  Advanced Directives  Does Patient Have a Medical Advance Directive? Yes  Type of Paramedic of [place];Living will;Out of facility DNR (pink MOST or yellow form)  Does patient want to make changes to medical advance directive? No - Patient declined  Copy of [place] in Chart? Yes - validated most recent copy scanned in chart (See row information)  Pre-existing out of facility DNR order (yellow form or pink MOST form) Pink MOST form placed in chart (order not valid for inpatient use)     Chief Complaint  Patient presents with   Acute Visit    Left leg pain    HPI:  Pt is a 89 y.o. [name] seen today for acute visit due to increased leg pain.   She currently resides on the skilled living unit at [facility]. PMH: aortic stenosis,CAD, HTN, HLD, atrial fib, emphysema, GERD, vascular dementia, and depression.     H/o dementia, frequent falls, recent rib/compression fracture. Today, she reports increased pain to left hip/knee. Poor historian due to dementia. No reports of recent falls or injury per nursing. She flinches when I press on left hip and femur. She receives tylenol 500 mg TID. She normally  ambulates with wheelchair. She has not left room as much as she normally does today. 09/16/2022 xray bilateral hips noted mild OA.     Past Medical History:  Diagnosis Date   Anxiety and depression    Aortic stenosis    a. 03/2014: Mild aortic stenosis by valve area and mean gradient though appeared more visually consistent with moderate AS.    CAD (coronary artery disease)    a. s/p prior PCI of D2 in 1992 b. stent to RCA in 2005   Diverticula of colon    GERD (gastroesophageal reflux disease)    HTN (hypertension)    Hypercholesteremia    Lateral myocardial infarction ([place]) 1992   Osteoarthritis    Osteopenia    Scoliosis    Past Surgical History:  Procedure Laterality Date   ABDOMINAL HYSTERECTOMY     APPENDECTOMY     CORONARY ANGIOPLASTY     TONSILLECTOMY      Allergies  Allergen Reactions   Latex Other (See Comments)    Unknown reaction - listed on [facility] 02/10/22   Nifedipine Other (See Comments)    Dark Urine; can only take orange tablet, allergic to yellow-brown tablet   Tetanus Toxoids Other (See Comments)    Unknown reaction   Tetanus-Diphtheria Toxoids Td Other (See Comments)    Unknown reaction   Adhesive [Tape] Rash    Outpatient Encounter Medications as of 01/22/2023  Medication Sig   acetaminophen (TYLENOL) 500 MG tablet Take 500 mg by mouth in the morning, at noon, and at bedtime.  amLODipine (NORVASC) 5 MG tablet Take 5 mg by mouth daily.   atorvastatin (LIPITOR) 10 MG tablet Take 10 mg by mouth at bedtime.   cetirizine (ZYRTEC) 10 MG tablet Take 10 mg by mouth every morning.   cholecalciferol (VITAMIN D) 25 MCG (1000 UNIT) tablet Take 2,000 Units by mouth every evening.   divalproex (DEPAKOTE SPRINKLE) 125 MG capsule Take 125 mg by mouth 2 (two) times daily.   ferrous sulfate 325 (65 FE) MG tablet Take 325 mg by mouth daily with breakfast.   folic acid (FOLVITE) 1 MG tablet Take 1 mg by mouth daily.   hydrochlorothiazide (HYDRODIURIL) 25 MG tablet Take  25 mg by mouth in the morning.   LORazepam (ATIVAN) 0.5 MG tablet Take 0.5 mg by mouth daily. For anxiety and agitation   LORazepam (ATIVAN) 0.5 MG tablet Take 1 tablet (0.5 mg total) by mouth every 12 (twelve) hours as needed.   losartan (COZAAR) 100 MG tablet Take 100 mg by mouth at bedtime.   melatonin 3 MG TABS tablet Take 3 mg by mouth at bedtime.   methocarbamol (ROBAXIN) 500 MG tablet Take 250 mg by mouth every 6 (six) hours as needed for muscle spasms.   metoprolol succinate (TOPROL-XL) 50 MG 24 hr tablet Take 50 mg by mouth every evening. Take with or immediately following a meal.   nitroGLYCERIN (NITROSTAT) 0.4 MG SL tablet Place 0.4 mg under the tongue every 5 (five) minutes as needed for chest pain. As needed fo chest pain   OLANZapine (ZYPREXA) 5 MG tablet Take 5 mg by mouth in the morning and at bedtime.   pantoprazole (PROTONIX) 40 MG tablet Take 40 mg by mouth daily.   Polyethylene Glycol 3350 (PEG 3350) 17 GM/SCOOP POWD Give 17 gram by mouth one time a day every 2 day(s) for constipation   potassium chloride (KLOR-CON M) 10 MEQ tablet Take 10 mEq by mouth daily.   senna (SENOKOT) 8.6 MG TABS tablet Take 1 tablet by mouth in the morning.   sertraline (ZOLOFT) 25 MG tablet Take 50 mg by mouth at bedtime.   No facility-administered encounter medications on file as of 01/22/2023.    Review of Systems  Unable to perform ROS: Dementia    Immunization History  Administered Date(s) Administered   Fluad Quad(high Dose 65+) 10/18/2022   Influenza Split 05/13/2009, 05/18/2010, 09/29/2011, 09/05/2012, 09/03/2013   Influenza, High Dose Seasonal PF 09/09/2015, 08/30/2017   Influenza, Quadrivalent, Recombinant, Inj, Pf 08/19/2018, 09/29/2019   Influenza-Unspecified 10/18/2021   Janssen (J&J) SARS-COV-2 Vaccination 11/20/2022   Moderna Covid-19 Vaccine Bivalent Booster 71yr & up 09/14/2021, 06/07/2022   Moderna SARS-COV2 Booster Vaccination 11/08/2020, 06/01/2021   Moderna  Sars-Covid-2 Vaccination 12/29/2019, 01/26/2020   PFIZER(Purple Top)SARS-COV-2 Vaccination 10/31/2022   Pfizer Covid-19 Vaccine Bivalent Booster 170yr& up 09/14/2020   Pneumococcal Conjugate-13 07/08/2013   Pneumococcal Polysaccharide-23 06/29/2006, 05/13/2009, 05/18/2010, 05/25/2011   Td 05/13/2009, 05/18/2010, 05/25/2011   Tdap 03/20/2022   Zoster Recombinat (Shingrix) 08/24/2017, 10/31/2017   Zoster, Live 08/22/2008, 08/24/2008, 05/13/2009, 05/18/2010, 05/25/2011   Pertinent  Health Maintenance Due  Topic Date Due   INFLUENZA VACCINE  Completed   DEXA SCAN  Completed      12/01/2022   11:44 AM 12/07/2022   10:23 AM 12/07/2022   10:24 AM 12/11/2022   10:46 AM 01/01/2023   10:38 AM  Fall Risk  Falls in the past year? '1 1 1 1 '$ 0  Was there an injury with Fall? 0 0 0 0 0  Fall Risk Category Calculator '1 1 1 1 '$ 0  Fall Risk Category (Retired) Low Low Low Low Low  (RETIRED) Patient Fall Risk Level High fall risk Low fall risk Low fall risk Low fall risk Low fall risk  Patient at Risk for Falls Due to History of fall(s) History of fall(s)  History of fall(s) History of fall(s)  Fall risk Follow up Falls evaluation completed Falls evaluation completed  Falls evaluation completed Falls evaluation completed   Functional Status Survey:    Vitals:   01/22/23 1236 01/22/23 1237  BP: (!) 148/75 (!) 151/80  Pulse: 75   Resp: 19   Temp: (!) 97.1 F (36.2 C)   SpO2: 90%   Weight: 117 lb 12.8 oz (53.4 kg)   Height: 5' (1.524 m)    Body mass index is 23.01 kg/m. Physical Exam Vitals reviewed.  Constitutional:      General: She is not in acute distress. HENT:     Head: Normocephalic and atraumatic.  Eyes:     General:        Right eye: No discharge.        Left eye: No discharge.  Cardiovascular:     Rate and Rhythm: Normal rate.     Pulses: Normal pulses.     Heart sounds: Normal heart sounds.  Pulmonary:     Effort: Pulmonary effort is normal. No respiratory distress.      Breath sounds: Normal breath sounds. No wheezing.  Abdominal:     General: Bowel sounds are normal. There is no distension.     Palpations: Abdomen is soft.     Tenderness: There is no abdominal tenderness.  Musculoskeletal:     Cervical back: Neck supple.     Right hip: No deformity, tenderness or crepitus. Normal range of motion. Normal strength.     Left hip: Tenderness present. No deformity or crepitus. Decreased range of motion. Normal strength.     Right knee: No swelling, deformity or crepitus. Normal range of motion. No tenderness.     Left knee: No swelling, deformity or crepitus. Normal range of motion. No tenderness.     Right lower leg: No edema.     Left lower leg: No edema.  Skin:    General: Skin is warm and dry.     Capillary Refill: Capillary refill takes less than 2 seconds.  Neurological:     General: No focal deficit present.     Mental Status: She is alert. Mental status is at baseline.     Motor: Weakness present.     Gait: Gait abnormal.     Comments: wheelchair  Psychiatric:        Mood and Affect: Mood normal.        Behavior: Behavior normal.     Labs reviewed: Recent Labs    02/11/22 0108 06/05/22 0000 09/08/22 0422 09/11/22 0730 11/02/22 0000  NA 138   < > 134* 135 141  K 3.0*   < > 3.2* 3.4* 3.8  CL 106   < > 101 Erin* 104  CO2 24   < > 25 29 31*  GLUCOSE 110*  --  106* 67  --   BUN 11   < > '10 16 11  '$ CREATININE 0.64   < > 0.38* 0.62 0.4*  CALCIUM 8.3*   < > 8.9 9.0 8.7   < > = values in this interval not displayed.   Recent Labs    02/09/22 0000 02/10/22 1303 09/11/22  0730 11/02/22 0000  AST '23 28 18 16  '$ ALT '22 25 11 10  '$ ALKPHOS 90 97  --  128*  BILITOT  --  0.5 0.6  --   PROT  --  6.3* 6.6  --   ALBUMIN 3.7 3.2*  --  3.6   Recent Labs    02/11/22 0108 02/20/22 0000 03/09/22 0000 06/05/22 0000 09/08/22 0422 09/11/22 0730 11/02/22 0000 12/21/22 0840  WBC 5.8   < > 3.7   < > 6.7 5.5 4.3 5.5  NEUTROABS 4.1   < > 1,550.00   --  5.0 3,801  --   --   HGB 8.0*   < > 10.7*   < > 14.1 14.5 13.4 14.1  HCT 26.6*   < > 36   < > 43.2 42.1 41 42  MCV 80.4  --   --   --  95.6 89.6  --   --   PLT 216   < > 266   < > 220 236 209 260   < > = values in this interval not displayed.   Lab Results  Component Value Date   TSH 2.90 11/24/2021   No results found for: "HGBA1C" Lab Results  Component Value Date   CHOL 165 12/21/2022   HDL 47 12/21/2022   LDLCALC 88 12/21/2022   TRIG 206 (A) 12/21/2022   CHOLHDL 1.9 09/17/2020    Significant Diagnostic Results in last 30 days:  No results found.  Assessment/Plan 1. Left hip pain - 09/23 bilateral xray hips noted mild OA - no reports of recent fall/injury - start prednisone 20 mg po QAM x 7 days - consider xray left hip if pain continues  2. Unstable gait - see above - ambulates with wheelchair - cont skilled nursing  3. Moderate late onset Alzheimer's dementia with other behavioral disturbance (HCC) - no behaviors - cont Depakote, Ativan, Zyprexa and Sertraline - cont skilled nursing    Family/ staff Communication: plan discussed with patient and nurse  Labs/tests ordered:  none

## 2023-01-25 ENCOUNTER — Non-Acute Institutional Stay (SKILLED_NURSING_FACILITY): Payer: Medicare Other | Admitting: Adult Health

## 2023-01-25 DIAGNOSIS — U071 COVID-19: Secondary | ICD-10-CM

## 2023-01-25 MED ORDER — MOLNUPIRAVIR EUA 200MG CAPSULE: 4.0000 | ORAL_CAPSULE | Freq: Two times a day (BID) | ORAL | 0 refills | Status: AC

## 2023-01-26 NOTE — Progress Notes (Unsigned)
Location:  [facility] Room Number: 30 A Place of Service:  SNF (31) Provider:  Durenda Age, DNP, FNP-BC  Patient Care Team: [name], MD as PCP - General (Internal Medicine) Martinique, Peter M, MD as PCP - Cardiology (Cardiology)  Extended Emergency Contact Information Primary Emergency Contact: [name],[name] Address: [address]          [place] [address] [place] Phone: [phone] Mobile Phone: [phone] Relation: Sister Secondary Emergency Contact: [name],[name]  [name] of [place] Phone: [phone] Mobile Phone: [phone] Relation: Niece  Code Status:  DNR  Goals of care: Advanced Directive information    01/01/2023   10:39 AM  Advanced Directives  Does Patient Have a Medical Advance Directive? Yes  Type of Paramedic of [place];Living will;Out of facility DNR (pink MOST or yellow form)  Does patient want to make changes to medical advance directive? No - Patient declined  Copy of [place] in Chart? Yes - validated most recent copy scanned in chart (See row information)  Pre-existing out of facility DNR order (yellow form or pink MOST form) Pink MOST form placed in chart (order not valid for inpatient use)     Chief Complaint  Patient presents with   Acute Visit    Tested positive for COVID-19    HPI:  Pt is a 89 y.o. female seen today for an acute visit for testing positive for COVID-19. She is a long-term care resident of [facility]. She has a PMH of CAD, hypertension, hypercholesterolemia, paroxysmal atrial fibrillation, emphysema and dementia. She tested positive for rapid COVID-19 test. She was seen in her room with her eyes closed but verbally responsive. She stated that she feels fatigued. She was tested for COVID-19 because she stated to the staff that she feels sick. No noted coughing. She had multiple COVID-19  vaccinations.   Past Medical History:  Diagnosis Date   Anxiety and depression    Aortic stenosis    a. 03/2014: Mild aortic stenosis by valve area and mean gradient though appeared more visually consistent with moderate AS.    CAD (coronary artery disease)    a. s/p prior PCI of D2 in 1992 b. stent to RCA in 2005   Diverticula of colon    GERD (gastroesophageal reflux disease)    HTN (hypertension)    Hypercholesteremia    Lateral myocardial infarction ([place]) 1992   Osteoarthritis    Osteopenia    Scoliosis    Past Surgical History:  Procedure Laterality Date   ABDOMINAL HYSTERECTOMY     APPENDECTOMY     CORONARY ANGIOPLASTY     TONSILLECTOMY      Allergies  Allergen Reactions   Latex Other (See Comments)    Unknown reaction - listed on [facility] 02/10/22   Nifedipine Other (See Comments)    Dark Urine; can only take orange tablet, allergic to yellow-brown tablet   Tetanus Toxoids Other (See Comments)    Unknown reaction   Tetanus-Diphtheria Toxoids Td Other (See Comments)    Unknown reaction   Adhesive [Tape] Rash    Outpatient Encounter Medications as of 01/25/2023  Medication Sig   molnupiravir EUA (LAGEVRIO) 200 mg CAPS capsule Take 4 capsules (800 mg total) by mouth 2 (two) times daily for 5 days.   acetaminophen (TYLENOL) 500 MG tablet Take 500 mg by mouth in the morning, at noon, and at bedtime.   amLODipine (NORVASC) 5 MG  tablet Take 5 mg by mouth daily.   atorvastatin (LIPITOR) 10 MG tablet Take 10 mg by mouth at bedtime.   cetirizine (ZYRTEC) 10 MG tablet Take 10 mg by mouth every morning.   cholecalciferol (VITAMIN D) 25 MCG (1000 UNIT) tablet Take 2,000 Units by mouth every evening.   divalproex (DEPAKOTE SPRINKLE) 125 MG capsule Take 125 mg by mouth 2 (two) times daily.   ferrous sulfate 325 (65 FE) MG tablet Take 325 mg by mouth daily with breakfast.   folic acid (FOLVITE) 1 MG tablet Take 1 mg by mouth daily.   hydrochlorothiazide (HYDRODIURIL) 25 MG tablet  Take 25 mg by mouth in the morning.   LORazepam (ATIVAN) 0.5 MG tablet Take 0.5 mg by mouth daily. For anxiety and agitation   LORazepam (ATIVAN) 0.5 MG tablet Take 1 tablet (0.5 mg total) by mouth every 12 (twelve) hours as needed.   losartan (COZAAR) 100 MG tablet Take 100 mg by mouth at bedtime.   melatonin 3 MG TABS tablet Take 3 mg by mouth at bedtime.   methocarbamol (ROBAXIN) 500 MG tablet Take 250 mg by mouth every 6 (six) hours as needed for muscle spasms.   metoprolol succinate (TOPROL-XL) 50 MG 24 hr tablet Take 50 mg by mouth every evening. Take with or immediately following a meal.   nitroGLYCERIN (NITROSTAT) 0.4 MG SL tablet Place 0.4 mg under the tongue every 5 (five) minutes as needed for chest pain. As needed fo chest pain   OLANZapine (ZYPREXA) 5 MG tablet Take 5 mg by mouth in the morning and at bedtime.   pantoprazole (PROTONIX) 40 MG tablet Take 40 mg by mouth daily.   Polyethylene Glycol 3350 (PEG 3350) 17 GM/SCOOP POWD Give 17 gram by mouth one time a day every 2 day(s) for constipation   potassium chloride (KLOR-CON M) 10 MEQ tablet Take 10 mEq by mouth daily.   senna (SENOKOT) 8.6 MG TABS tablet Take 1 tablet by mouth in the morning.   sertraline (ZOLOFT) 25 MG tablet Take 50 mg by mouth at bedtime.   No facility-administered encounter medications on file as of 01/25/2023.    Review of systems:  Unable to obtain due to dementia.  Immunization History  Administered Date(s) Administered   Fluad Quad(high Dose 65+) 10/18/2022   Influenza Split 05/13/2009, 05/18/2010, 09/29/2011, 09/05/2012, 09/03/2013   Influenza, High Dose Seasonal PF 09/09/2015, 08/30/2017   Influenza, Quadrivalent, Recombinant, Inj, Pf 08/19/2018, 09/29/2019   Influenza-Unspecified 10/18/2021   Janssen (J&J) SARS-COV-2 Vaccination 11/20/2022   Moderna Covid-19 Vaccine Bivalent Booster 72yr & up 09/14/2021, 06/07/2022   Moderna SARS-COV2 Booster Vaccination 11/08/2020, 06/01/2021   Moderna  Sars-Covid-2 Vaccination 12/29/2019, 01/26/2020   PFIZER(Purple Top)SARS-COV-2 Vaccination 10/31/2022   Pfizer Covid-19 Vaccine Bivalent Booster 113yr& up 09/14/2020   Pneumococcal Conjugate-13 07/08/2013   Pneumococcal Polysaccharide-23 06/29/2006, 05/13/2009, 05/18/2010, 05/25/2011   Td 05/13/2009, 05/18/2010, 05/25/2011   Tdap 03/20/2022   Zoster Recombinat (Shingrix) 08/24/2017, 10/31/2017   Zoster, Live 08/22/2008, 08/24/2008, 05/13/2009, 05/18/2010, 05/25/2011   Pertinent  Health Maintenance Due  Topic Date Due   INFLUENZA VACCINE  Completed   DEXA SCAN  Completed      12/01/2022   11:44 AM 12/07/2022   10:23 AM 12/07/2022   10:24 AM 12/11/2022   10:46 AM 01/01/2023   10:38 AM  Fall Risk  Falls in the past year? '1 1 1 1 '$ 0  Was there an injury with Fall? 0 0 0 0 0  Fall Risk Category Calculator 1  $'1 1 1 'C$ 0  Fall Risk Category (Retired) Low Low Low Low Low  (RETIRED) Patient Fall Risk Level High fall risk Low fall risk Low fall risk Low fall risk Low fall risk  Patient at Risk for Falls Due to History of fall(s) History of fall(s)  History of fall(s) History of fall(s)  Fall risk Follow up Falls evaluation completed Falls evaluation completed  Falls evaluation completed Falls evaluation completed     Vitals:   01/25/23 1700  BP: 104/70  Pulse: 63  Resp: 16  Temp: 97.7 F (36.5 C)  Weight: 117 lb 12.8 oz (53.4 kg)  Height: '5\' 4"'$  (1.626 m)   Body mass index is 20.22 kg/m.  Physical Exam Constitutional:      Appearance: Normal appearance. She is ill-appearing.  HENT:     Head: Normocephalic and atraumatic.     Nose: Nose normal.     Mouth/Throat:     Mouth: Mucous membranes are moist.  Eyes:     Conjunctiva/sclera: Conjunctivae normal.  Cardiovascular:     Rate and Rhythm: Normal rate and regular rhythm.  Pulmonary:     Effort: Pulmonary effort is normal.     Breath sounds: Normal breath sounds.  Abdominal:     General: Bowel sounds are normal.      Palpations: Abdomen is soft.  Musculoskeletal:     Cervical back: Normal range of motion.  Skin:    General: Skin is warm and dry.  Neurological:     Mental Status: Mental status is at baseline.  Psychiatric:        Mood and Affect: Mood normal.        Behavior: Behavior normal.        Labs reviewed: Recent Labs    02/11/22 0108 06/05/22 0000 09/08/22 0422 09/11/22 0730 11/02/22 0000  NA 138   < > 134* 135 141  K 3.0*   < > 3.2* 3.4* 3.8  CL 106   < > 101 96* 104  CO2 24   < > 25 29 31*  GLUCOSE 110*  --  106* 67  --   BUN 11   < > '10 16 11  '$ CREATININE 0.64   < > 0.38* 0.62 0.4*  CALCIUM 8.3*   < > 8.9 9.0 8.7   < > = values in this interval not displayed.   Recent Labs    02/09/22 0000 02/10/22 1303 09/11/22 0730 11/02/22 0000  AST '23 28 18 16  '$ ALT '22 25 11 10  '$ ALKPHOS 90 97  --  128*  BILITOT  --  0.5 0.6  --   PROT  --  6.3* 6.6  --   ALBUMIN 3.7 3.2*  --  3.6   Recent Labs    02/11/22 0108 02/20/22 0000 03/09/22 0000 06/05/22 0000 09/08/22 0422 09/11/22 0730 11/02/22 0000 12/21/22 0840  WBC 5.8   < > 3.7   < > 6.7 5.5 4.3 5.5  NEUTROABS 4.1   < > 1,550.00  --  5.0 3,801  --   --   HGB 8.0*   < > 10.7*   < > 14.1 14.5 13.4 14.1  HCT 26.6*   < > 36   < > 43.2 42.1 41 42  MCV 80.4  --   --   --  95.6 89.6  --   --   PLT 216   < > 266   < > 220 236 209 260   < > = values  in this interval not displayed.   Lab Results  Component Value Date   TSH 2.90 11/24/2021   No results found for: "HGBA1C" Lab Results  Component Value Date   CHOL 165 12/21/2022   HDL 47 12/21/2022   LDLCALC 88 12/21/2022   TRIG 206 (A) 12/21/2022   CHOLHDL 1.9 09/17/2020    Significant Diagnostic Results in last 30 days:  No results found.  Assessment/Plan  1. COVID-19 virus infection -  tested positive for COVID-19 -  will be on respiratory isolation X 10 days -  continue supportive care - molnupiravir EUA (LAGEVRIO) 200 mg CAPS capsule; Take 4 capsules (800 mg  total) by mouth 2 (two) times daily for 5 days.  Dispense: 40 capsule; Refill: 0     Family/ staff Communication: Discussed plan of care with  charge nurse.  Labs/tests ordered:   COVID-19 test    Durenda Age, DNP, MSN, FNP-BC [place] [phone] (Monday-Friday 8:00 a.m. - 5:00 p.m.) [phone] (after hours)

## 2023-01-30 ENCOUNTER — Other Ambulatory Visit: Payer: Self-pay | Admitting: Adult Health

## 2023-01-30 DIAGNOSIS — F419 Anxiety disorder, unspecified: Secondary | ICD-10-CM

## 2023-01-30 DIAGNOSIS — G301 Alzheimer's disease with late onset: Secondary | ICD-10-CM

## 2023-01-30 MED ORDER — LORAZEPAM 0.5 MG PO TABS: 0.5000 mg | ORAL_TABLET | Freq: Two times a day (BID) | ORAL | 0 refills | Status: DC | PRN

## 2023-01-31 ENCOUNTER — Encounter: Payer: Self-pay | Admitting: Adult Health

## 2023-01-31 ENCOUNTER — Non-Acute Institutional Stay (SKILLED_NURSING_FACILITY): Payer: Medicare Other | Admitting: Adult Health

## 2023-01-31 DIAGNOSIS — E861 Hypovolemia: Secondary | ICD-10-CM

## 2023-01-31 DIAGNOSIS — R400 Somnolence: Secondary | ICD-10-CM

## 2023-01-31 DIAGNOSIS — J9601 Acute respiratory failure with hypoxia: Secondary | ICD-10-CM | POA: Diagnosis not present

## 2023-01-31 DIAGNOSIS — I9589 Other hypotension: Secondary | ICD-10-CM

## 2023-01-31 DIAGNOSIS — U071 COVID-19: Secondary | ICD-10-CM

## 2023-01-31 NOTE — Progress Notes (Signed)
Location:  [facility] Room Number: 30 A Place of Service:  SNF (31) Provider:  Durenda Age, DNP, FNP-BC  Patient Care Team: [name], MD as PCP - General (Internal Medicine) Martinique, Peter M, MD as PCP - Cardiology (Cardiology)  Extended Emergency Contact Information Primary Emergency Contact: [name],[name] Address: [address]          [place] [address] [place] Phone: [phone] Mobile Phone: [phone] Relation: Sister Secondary Emergency Contact: [name],[name]  [name] of [place] Phone: [phone] Mobile Phone: [phone] Relation: Niece  Code Status:   DNR  Goals of care: Advanced Directive information    01/01/2023   10:39 AM  Advanced Directives  Does Patient Have a Medical Advance Directive? Yes  Type of Paramedic of [place];Living will;Out of facility DNR (pink MOST or yellow form)  Does patient want to make changes to medical advance directive? No - Patient declined  Copy of [place] in Chart? Yes - validated most recent copy scanned in chart (See row information)  Pre-existing out of facility DNR order (yellow form or pink MOST form) Pink MOST form placed in chart (order not valid for inpatient use)     Chief Complaint  Patient presents with   Acute Visit    Sleepy, low BP    HPI:  Pt is a 89 y.o. female seen today for an acute visit regarding low O2 sat and sleepy. She is a long-term care resident of [facility]. She has a PMH of CAD, hypertension, hypercholesterolemia, PAF, emphysema and dementia. She was seen in her room today with charge nurse. She was noted to have O2 sat at 80% on room air and was sleepy but verbally responsive to queries. No shortness of breath noted. She is currently on quarantine due to COVID-19 infection. She completed Molnupiravir 5 day treatment. She was reported to have eaten yesterday per  CNA report. BP 88/67 and HR 120. She currently takes Amlodipine, Metoprolol succinate., HCTZ and Losartan.   Past Medical History:  Diagnosis Date   Anxiety and depression    Aortic stenosis    a. 03/2014: Mild aortic stenosis by valve area and mean gradient though appeared more visually consistent with moderate AS.    CAD (coronary artery disease)    a. s/p prior PCI of D2 in 1992 b. stent to RCA in 2005   Diverticula of colon    GERD (gastroesophageal reflux disease)    HTN (hypertension)    Hypercholesteremia    Lateral myocardial infarction ([place]) 1992   Osteoarthritis    Osteopenia    Scoliosis    Past Surgical History:  Procedure Laterality Date   ABDOMINAL HYSTERECTOMY     APPENDECTOMY     CORONARY ANGIOPLASTY     TONSILLECTOMY      Allergies  Allergen Reactions   Latex Other (See Comments)    Unknown reaction - listed on [facility] 02/10/22   Nifedipine Other (See Comments)    Dark Urine; can only take orange tablet, allergic to yellow-brown tablet   Tetanus Toxoids Other (See Comments)    Unknown reaction   Tetanus-Diphtheria Toxoids Td Other (See Comments)    Unknown reaction   Adhesive [Tape] Rash    Outpatient Encounter Medications as of 01/31/2023  Medication Sig   acetaminophen (TYLENOL) 500 MG tablet Take 500 mg by mouth in the morning, at noon, and at bedtime.   atorvastatin (LIPITOR) 10 MG  tablet Take 10 mg by mouth at bedtime.   cetirizine (ZYRTEC) 10 MG tablet Take 10 mg by mouth every morning.   cholecalciferol (VITAMIN D) 25 MCG (1000 UNIT) tablet Take 2,000 Units by mouth every evening.   divalproex (DEPAKOTE SPRINKLE) 125 MG capsule Take 125 mg by mouth 2 (two) times daily.   ferrous sulfate 325 (65 FE) MG tablet Take 325 mg by mouth daily with breakfast.   folic acid (FOLVITE) 1 MG tablet Take 1 mg by mouth daily.   LORazepam (ATIVAN) 0.5 MG tablet Take 0.5 mg by mouth daily. For anxiety and agitation   LORazepam (ATIVAN) 0.5 MG tablet Take 1 tablet (0.5  mg total) by mouth every 12 (twelve) hours as needed.   methocarbamol (ROBAXIN) 500 MG tablet Take 250 mg by mouth every 6 (six) hours as needed for muscle spasms.   metoprolol succinate (TOPROL-XL) 50 MG 24 hr tablet Take 50 mg by mouth every evening. Take with or immediately following a meal.   nitroGLYCERIN (NITROSTAT) 0.4 MG SL tablet Place 0.4 mg under the tongue every 5 (five) minutes as needed for chest pain. As needed fo chest pain   OLANZapine (ZYPREXA) 5 MG tablet Take 5 mg by mouth in the morning and at bedtime.   pantoprazole (PROTONIX) 40 MG tablet Take 40 mg by mouth daily.   Polyethylene Glycol 3350 (PEG 3350) 17 GM/SCOOP POWD Give 17 gram by mouth one time a day every 2 day(s) for constipation   potassium chloride (KLOR-CON M) 10 MEQ tablet Take 10 mEq by mouth daily.   senna (SENOKOT) 8.6 MG TABS tablet Take 1 tablet by mouth in the morning.   sertraline (ZOLOFT) 25 MG tablet Take 50 mg by mouth at bedtime.   [DISCONTINUED] amLODipine (NORVASC) 5 MG tablet Take 5 mg by mouth daily.   [DISCONTINUED] hydrochlorothiazide (HYDRODIURIL) 25 MG tablet Take 25 mg by mouth in the morning.   [DISCONTINUED] losartan (COZAAR) 100 MG tablet Take 100 mg by mouth at bedtime.   [DISCONTINUED] melatonin 3 MG TABS tablet Take 3 mg by mouth at bedtime.   No facility-administered encounter medications on file as of 01/31/2023.    Review of Systems  Unable to obtain due to dementia and being sleepy.    Immunization History  Administered Date(s) Administered   Fluad Quad(high Dose 65+) 10/18/2022   Influenza Split 05/13/2009, 05/18/2010, 09/29/2011, 09/05/2012, 09/03/2013   Influenza, High Dose Seasonal PF 09/09/2015, 08/30/2017   Influenza, Quadrivalent, Recombinant, Inj, Pf 08/19/2018, 09/29/2019   Influenza-Unspecified 10/18/2021   Janssen (J&J) SARS-COV-2 Vaccination 11/20/2022   Moderna Covid-19 Vaccine Bivalent Booster 66yr & up 09/14/2021, 06/07/2022   Moderna SARS-COV2 Booster  Vaccination 11/08/2020, 06/01/2021   Moderna Sars-Covid-2 Vaccination 12/29/2019, 01/26/2020   PFIZER(Purple Top)SARS-COV-2 Vaccination 10/31/2022   Pfizer Covid-19 Vaccine Bivalent Booster 122yr& up 09/14/2020   Pneumococcal Conjugate-13 07/08/2013   Pneumococcal Polysaccharide-23 06/29/2006, 05/13/2009, 05/18/2010, 05/25/2011   Td 05/13/2009, 05/18/2010, 05/25/2011   Tdap 03/20/2022   Zoster Recombinat (Shingrix) 08/24/2017, 10/31/2017   Zoster, Live 08/22/2008, 08/24/2008, 05/13/2009, 05/18/2010, 05/25/2011   Pertinent  Health Maintenance Due  Topic Date Due   INFLUENZA VACCINE  Completed   DEXA SCAN  Completed      12/01/2022   11:44 AM 12/07/2022   10:23 AM 12/07/2022   10:24 AM 12/11/2022   10:46 AM 01/01/2023   10:38 AM  Fall Risk  Falls in the past year? '1 1 1 1 '$ 0  Was there an injury with Fall? 0 0  0 0 0  Fall Risk Category Calculator '1 1 1 1 '$ 0  Fall Risk Category (Retired) Low Low Low Low Low  (RETIRED) Patient Fall Risk Level High fall risk Low fall risk Low fall risk Low fall risk Low fall risk  Patient at Risk for Falls Due to History of fall(s) History of fall(s)  History of fall(s) History of fall(s)  Fall risk Follow up Falls evaluation completed Falls evaluation completed  Falls evaluation completed Falls evaluation completed     Vitals:   01/31/23 1743  BP: (!) 88/67  Pulse: (!) 120  Resp: 16  Temp: (!) 96.1 F (35.6 C)  Weight: 117 lb 12.8 oz (53.4 kg)  Height: '5\' 4"'$  (1.626 m)   Body mass index is 20.22 kg/m.  Physical Exam Constitutional:      Comments: Sleepy  HENT:     Head: Normocephalic and atraumatic.     Nose: Nose normal.     Mouth/Throat:     Mouth: Mucous membranes are moist.  Eyes:     Conjunctiva/sclera: Conjunctivae normal.  Cardiovascular:     Rate and Rhythm: Normal rate and regular rhythm.  Pulmonary:     Effort: Pulmonary effort is normal.     Breath sounds: Normal breath sounds.  Abdominal:     General: Bowel sounds are  normal.     Palpations: Abdomen is soft.  Skin:    General: Skin is warm and dry.  Neurological:     Comments: Sleepy, verbally responsive.  Psychiatric:        Mood and Affect: Mood normal.       Labs reviewed: Recent Labs    02/11/22 0108 06/05/22 0000 09/08/22 0422 09/11/22 0730 11/02/22 0000  NA 138   < > 134* 135 141  K 3.0*   < > 3.2* 3.4* 3.8  CL 106   < > 101 96* 104  CO2 24   < > 25 29 31*  GLUCOSE 110*  --  106* 67  --   BUN 11   < > '10 16 11  '$ CREATININE 0.64   < > 0.38* 0.62 0.4*  CALCIUM 8.3*   < > 8.9 9.0 8.7   < > = values in this interval not displayed.   Recent Labs    02/09/22 0000 02/10/22 1303 09/11/22 0730 11/02/22 0000  AST '23 28 18 16  '$ ALT '22 25 11 10  '$ ALKPHOS 90 97  --  128*  BILITOT  --  0.5 0.6  --   PROT  --  6.3* 6.6  --   ALBUMIN 3.7 3.2*  --  3.6   Recent Labs    02/11/22 0108 02/20/22 0000 03/09/22 0000 06/05/22 0000 09/08/22 0422 09/11/22 0730 11/02/22 0000 12/21/22 0840  WBC 5.8   < > 3.7   < > 6.7 5.5 4.3 5.5  NEUTROABS 4.1   < > 1,550.00  --  5.0 3,801  --   --   HGB 8.0*   < > 10.7*   < > 14.1 14.5 13.4 14.1  HCT 26.6*   < > 36   < > 43.2 42.1 41 42  MCV 80.4  --   --   --  95.6 89.6  --   --   PLT 216   < > 266   < > 220 236 209 260   < > = values in this interval not displayed.   Lab Results  Component Value Date   TSH 2.90 11/24/2021  No results found for: "HGBA1C" Lab Results  Component Value Date   CHOL 165 12/21/2022   HDL 47 12/21/2022   LDLCALC 88 12/21/2022   TRIG 206 (A) 12/21/2022   CHOLHDL 1.9 09/17/2020    Significant Diagnostic Results in last 30 days:  No results found.  Assessment/Plan  1. Hypotension due to hypovolemia -  she is on multiple medications for hypertension and has poor oral intake -  discontinue Losartan, HCTZ, KCL ER and Amlodipine -  will hold Metoprolol succinate for SBP < 110 and HR <60 -  monitor BP and HR BID -  start D5.9 NS IVF at 70 ml/hour X 2L via PIV  2.  Somnolence -  thought to be due to multiple medications such as Ativan, Sertraline, Depakote, Olanzepine -  will hold all above medications X 2 days  3. Acute respiratory failure with hypoxia (HCC) -  hooked to O2 @ 2L/min via [place] and O2 sat improved to 93% -  chest x-ray PA and lateral ordered to rule out pneumonia  4. COVID-19 virus infection -  completed Molnupiravir X 5 days    Family/ staff Communication: Discussed plan of care with sister and charge nurse.  Called sister, Lacey Jensen, and updated her regarding resident's condition. She stated that she wants to do the treatments at [facility] for now. She said that she has not been visiting due to her husband having asthma.  Labs/tests ordered:  chest x-ray PA and lateral, CBC with differentials and BMP with GFR    Durenda Age, DNP, MSN, FNP-BC [place] [phone] (Monday-Friday 8:00 a.m. - 5:00 p.m.) [phone] (after hours)

## 2023-02-01 ENCOUNTER — Telehealth: Payer: Medicare Other | Admitting: Adult Health

## 2023-02-01 ENCOUNTER — Encounter: Payer: Self-pay | Admitting: Internal Medicine

## 2023-02-01 ENCOUNTER — Non-Acute Institutional Stay (SKILLED_NURSING_FACILITY): Payer: Medicare Other | Admitting: Internal Medicine

## 2023-02-01 DIAGNOSIS — F419 Anxiety disorder, unspecified: Secondary | ICD-10-CM | POA: Diagnosis not present

## 2023-02-01 DIAGNOSIS — E861 Hypovolemia: Secondary | ICD-10-CM | POA: Diagnosis not present

## 2023-02-01 DIAGNOSIS — I10 Essential (primary) hypertension: Secondary | ICD-10-CM | POA: Diagnosis not present

## 2023-02-01 DIAGNOSIS — G301 Alzheimer's disease with late onset: Secondary | ICD-10-CM

## 2023-02-01 DIAGNOSIS — F02B18 Dementia in other diseases classified elsewhere, moderate, with other behavioral disturbance: Secondary | ICD-10-CM

## 2023-02-01 DIAGNOSIS — R0902 Hypoxemia: Secondary | ICD-10-CM | POA: Diagnosis not present

## 2023-02-01 DIAGNOSIS — U071 COVID-19: Secondary | ICD-10-CM

## 2023-02-01 DIAGNOSIS — F32A Depression, unspecified: Secondary | ICD-10-CM

## 2023-02-01 DIAGNOSIS — I9589 Other hypotension: Secondary | ICD-10-CM

## 2023-02-01 DIAGNOSIS — R400 Somnolence: Secondary | ICD-10-CM

## 2023-02-01 DIAGNOSIS — I480 Paroxysmal atrial fibrillation: Secondary | ICD-10-CM

## 2023-02-01 LAB — CBC AND DIFFERENTIAL
HCT: 39 (ref 36–46)
Hemoglobin: 13.1 (ref 12.0–16.0)
Platelets: 207 10*3/uL (ref 150–400)
WBC: 10.3

## 2023-02-01 LAB — CBC: RBC: 7.3 — AB (ref 3.87–5.11)

## 2023-02-01 LAB — BASIC METABOLIC PANEL
BUN: 22 — AB (ref 4–21)
CO2: 25 — AB (ref 13–22)
Chloride: 106 (ref 99–108)
Creatinine: 0.5 (ref 0.5–1.1)
Glucose: 99
Potassium: 3.9 mEq/L (ref 3.5–5.1)
Sodium: 141 (ref 137–147)

## 2023-02-01 LAB — COMPREHENSIVE METABOLIC PANEL
Calcium: 8.7 (ref 8.7–10.7)
eGFR: 88

## 2023-02-01 NOTE — Progress Notes (Signed)
Location:  [facility] Room Number: [place] of Service:  SNF [phone]) Provider:  [name], MD   [name], MD  Patient Care Team: [name], MD as PCP - General (Internal Medicine) [place], Peter M, MD as PCP - Cardiology (Cardiology)  Extended Emergency Contact Information Primary Emergency Contact: [name],[name] Address: [place]          [place] [address] [name] of [place] Phone: [phone] Mobile Phone: [phone] Relation: Sister Secondary Emergency Contact: [name],[name]  [name] of [place] Phone: [phone] Mobile Phone: [phone] Relation: Niece  Code Status:  DNR Goals of care: Advanced Directive information    02/01/2023   10:22 AM  Advanced Directives  Does Patient Have a Medical Advance Directive? Yes  Type of Paramedic of [place];Living will;Out of facility DNR (pink MOST or yellow form)  Does patient want to make changes to medical advance directive? No - Patient declined  Copy of [place] in Chart? Yes - validated most recent copy scanned in chart (See row information)  Pre-existing out of facility DNR order (yellow form or pink MOST form) Pink MOST form placed in chart (order not valid for inpatient use)     Chief Complaint  Patient presents with   Acute Visit    Patient is being seen for poor appetite and weakness    Quality Metric Gaps    Discussed the need for AWV    HPI:  Pt is a 89 y.o. female seen today for an acute visit for Weakness Behaviors  Lives in SNF   Diagnosed with Covid on 02/01 Treated with Molnupiravir Yesterday seen for lethargy and Low BP All her BP meds and Anxiety meds were stopped   This morning she is more awake  Feels weak Still Has cough but she is screaming and having behavior issues including coming out of her room  Dropping stuff No Fever or SOB   Patient has h/o  has h/o CAD s/p  Angioplasty and Stenting in RCA HTN, HLD Moderate Aortic stenosis,  H/o PAF Underwent DCCV No Anticoagulation due to her h/o Falls and Acute Anemia Chronic Back pain Unstable Gait and Falls Sustained Compression Fractures of T 6 and T 10 L3 Fracture Anemia Refused GI work up Dementia     Past Medical History:  Diagnosis Date   Anxiety and depression    Aortic stenosis    a. 03/2014: Mild aortic stenosis by valve area and mean gradient though appeared more visually consistent with moderate AS.    CAD (coronary artery disease)    a. s/p prior PCI of D2 in 1992 b. stent to RCA in 2005   Diverticula of colon    GERD (gastroesophageal reflux disease)    HTN (hypertension)    Hypercholesteremia    Lateral myocardial infarction ([place]) 1992   Osteoarthritis    Osteopenia    Scoliosis    Past Surgical History:  Procedure Laterality Date   ABDOMINAL HYSTERECTOMY     APPENDECTOMY     CORONARY ANGIOPLASTY     TONSILLECTOMY      Allergies  Allergen Reactions   Latex Other (See Comments)    Unknown reaction - listed on [facility] 02/10/22   Nifedipine Other (See Comments)    Dark Urine; can only take orange tablet, allergic to yellow-brown tablet   Tetanus Toxoids Other (See Comments)    Unknown reaction   Tetanus-Diphtheria Toxoids Td Other (See  Comments)    Unknown reaction   Adhesive [Tape] Rash    Outpatient Encounter Medications as of 02/01/2023  Medication Sig   acetaminophen (TYLENOL) 500 MG tablet Take 500 mg by mouth in the morning, at noon, and at bedtime.   atorvastatin (LIPITOR) 10 MG tablet Take 10 mg by mouth at bedtime.   cetirizine (ZYRTEC) 10 MG tablet Take 10 mg by mouth every morning.   cholecalciferol (VITAMIN D) 25 MCG (1000 UNIT) tablet Take 2,000 Units by mouth every evening.   divalproex (DEPAKOTE SPRINKLE) 125 MG capsule Take 125 mg by mouth 2 (two) times daily.   ferrous sulfate 325 (65 FE) MG tablet Take 325 mg by mouth daily with breakfast.   folic acid  (FOLVITE) 1 MG tablet Take 1 mg by mouth daily.   LORazepam (ATIVAN) 0.5 MG tablet Take 0.5 mg by mouth daily. For anxiety and agitation   LORazepam (ATIVAN) 0.5 MG tablet Take 1 tablet (0.5 mg total) by mouth every 12 (twelve) hours as needed.   methocarbamol (ROBAXIN) 500 MG tablet Take 250 mg by mouth every 6 (six) hours as needed for muscle spasms.   metoprolol succinate (TOPROL-XL) 50 MG 24 hr tablet Take 50 mg by mouth every evening. Take with or immediately following a meal.   nitroGLYCERIN (NITROSTAT) 0.4 MG SL tablet Place 0.4 mg under the tongue every 5 (five) minutes as needed for chest pain. As needed fo chest pain   OLANZapine (ZYPREXA) 5 MG tablet Take 5 mg by mouth in the morning and at bedtime.   pantoprazole (PROTONIX) 40 MG tablet Take 40 mg by mouth daily.   Polyethylene Glycol 3350 (PEG 3350) 17 GM/SCOOP POWD Give 17 gram by mouth one time a day every 2 day(s) for constipation   potassium chloride (KLOR-CON M) 10 MEQ tablet Take 10 mEq by mouth daily.   senna (SENOKOT) 8.6 MG TABS tablet Take 1 tablet by mouth in the morning.   sertraline (ZOLOFT) 25 MG tablet Take 50 mg by mouth at bedtime.   No facility-administered encounter medications on file as of 02/01/2023.    Review of Systems  Unable to perform ROS: Dementia    Immunization History  Administered Date(s) Administered   Fluad Quad(high Dose 65+) 10/18/2022   Influenza Split 05/13/2009, 05/18/2010, 09/29/2011, 09/05/2012, 09/03/2013   Influenza, High Dose Seasonal PF 09/09/2015, 08/30/2017   Influenza, Quadrivalent, Recombinant, Inj, Pf 08/19/2018, 09/29/2019   Influenza-Unspecified 10/18/2021   Janssen (J&J) SARS-COV-2 Vaccination 11/20/2022   Moderna Covid-19 Vaccine Bivalent Booster 65yr & up 09/14/2021, 06/07/2022   Moderna SARS-COV2 Booster Vaccination 11/08/2020, 06/01/2021   Moderna Sars-Covid-2 Vaccination 12/29/2019, 01/26/2020   PFIZER(Purple Top)SARS-COV-2 Vaccination 10/31/2022   Pfizer Covid-19  Vaccine Bivalent Booster 143yr& up 09/14/2020   Pneumococcal Conjugate-13 07/08/2013   Pneumococcal Polysaccharide-23 06/29/2006, 05/13/2009, 05/18/2010, 05/25/2011   Td 05/13/2009, 05/18/2010, 05/25/2011   Tdap 03/20/2022   Zoster Recombinat (Shingrix) 08/24/2017, 10/31/2017   Zoster, Live 08/22/2008, 08/24/2008, 05/13/2009, 05/18/2010, 05/25/2011   Pertinent  Health Maintenance Due  Topic Date Due   INFLUENZA VACCINE  Completed   DEXA SCAN  Completed      12/01/2022   11:44 AM 12/07/2022   10:23 AM 12/07/2022   10:24 AM 12/11/2022   10:46 AM 01/01/2023   10:38 AM  Fall Risk  Falls in the past year? 1 1 1 1 $ 0  Was there an injury with Fall? 0 0 0 0 0  Fall Risk Category Calculator 1 1 1 1 $ 0  Fall  Risk Category (Retired) Low Low Low Low Low  (RETIRED) Patient Fall Risk Level High fall risk Low fall risk Low fall risk Low fall risk Low fall risk  Patient at Risk for Falls Due to History of fall(s) History of fall(s)  History of fall(s) History of fall(s)  Fall risk Follow up Falls evaluation completed Falls evaluation completed  Falls evaluation completed Falls evaluation completed   Functional Status Survey:    Vitals:   02/01/23 1008  BP: 119/83  Pulse: (!) 118  Resp: 16  Temp: (!) 96.1 F (35.6 C)  TempSrc: Temporal  SpO2: 91%  Weight: 117 lb 12.8 oz (53.4 kg)  Height: 5' 4"$  (1.626 m)   Body mass index is 20.22 kg/m. Physical Exam Vitals reviewed.  Constitutional:      Appearance: Normal appearance.  HENT:     Head: Normocephalic.     Nose: Nose normal.     Mouth/Throat:     Mouth: Mucous membranes are moist.     Pharynx: Oropharynx is clear.  Eyes:     Pupils: Pupils are equal, round, and reactive to light.  Cardiovascular:     Rate and Rhythm: Normal rate and regular rhythm.     Pulses: Normal pulses.     Heart sounds: Murmur heard.  Pulmonary:     Effort: Pulmonary effort is normal.     Comments: Few rales in the bases Abdominal:     General:  Abdomen is flat. Bowel sounds are normal.     Palpations: Abdomen is soft.  Musculoskeletal:        General: No swelling.     Cervical back: Neck supple.  Skin:    General: Skin is warm.  Neurological:     General: No focal deficit present.     Mental Status: She is alert.     Comments: Awake and Responsive Trying to come out of her room  Psychiatric:        Mood and Affect: Mood normal.        Thought Content: Thought content normal.    Labs reviewed: Recent Labs    02/11/22 0108 06/05/22 0000 09/08/22 0422 09/11/22 0730 11/02/22 0000  NA 138   < > 134* 135 141  K 3.0*   < > 3.2* 3.4* 3.8  CL 106   < > 101 96* 104  CO2 24   < > 25 29 31*  GLUCOSE 110*  --  106* 67  --   BUN 11   < > 10 16 11  $ CREATININE 0.64   < > 0.38* 0.62 0.4*  CALCIUM 8.3*   < > 8.9 9.0 8.7   < > = values in this interval not displayed.   Recent Labs    02/09/22 0000 02/10/22 1303 09/11/22 0730 11/02/22 0000  AST 23 28 18 16  $ ALT 22 25 11 10  $ ALKPHOS 90 97  --  128*  BILITOT  --  0.5 0.6  --   PROT  --  6.3* 6.6  --   ALBUMIN 3.7 3.2*  --  3.6   Recent Labs    02/11/22 0108 02/20/22 0000 03/09/22 0000 06/05/22 0000 09/08/22 0422 09/11/22 0730 11/02/22 0000 12/21/22 0840  WBC 5.8   < > 3.7   < > 6.7 5.5 4.3 5.5  NEUTROABS 4.1   < > 1,550.00  --  5.0 3,801  --   --   HGB 8.0*   < > 10.7*   < > 14.1  14.5 13.4 14.1  HCT 26.6*   < > 36   < > 43.2 42.1 41 42  MCV 80.4  --   --   --  95.6 89.6  --   --   PLT 216   < > 266   < > 220 236 209 260   < > = values in this interval not displayed.   Lab Results  Component Value Date   TSH 2.90 11/24/2021   No results found for: "HGBA1C" Lab Results  Component Value Date   CHOL 165 12/21/2022   HDL 47 12/21/2022   LDLCALC 88 12/21/2022   TRIG 206 (A) 12/21/2022   CHOLHDL 1.9 09/17/2020    Significant Diagnostic Results in last 30 days:  No results found.  Assessment/Plan Lethargy and Hypotension Lethargy better since her  Behavior meds were discontinued Labs came back and all are in Normal Limits BUN is 22 creat 0.52 WBC 10.3 Hgb 13.1 Chest Xray negative for any infilterate Will Restart her Depakote and Ativan for now Discontinue Zyprexa Will keep her off Cozaar and Norvasc  Covid positive with weakness and Poor Appetite Discussed with sister told her that we will push her to eat but not sure if she will do well  Were unable to get IV yesterday and now with her behaviors she would not keep it in  Family/ staff Communication: sister  Labs/tests ordered:  none Total time spent in this patient care encounter was  _35  minutes; greater than 50% of the visit spent counseling patient and staff, reviewing records , Labs and coordinating care for problems addressed at this encounter.

## 2023-02-01 NOTE — Telephone Encounter (Signed)
Nurse called to report that they were not able to obtain an IV. Pt is 89 years old in skilled care at [facility]. Has a most form stating no hospitalizations. BP is soft 88/56 BP meds stopped today.  I instructed her to call the family and let them know that they could not obtain an IV. They can push fluids and try in the am for an IV. If the POA does not agree with this would send to the ER.

## 2023-02-05 ENCOUNTER — Other Ambulatory Visit: Payer: Self-pay | Admitting: Adult Health

## 2023-02-05 MED ORDER — OXYCODONE-ACETAMINOPHEN 5-325 MG PO TABS: 0.5000 | ORAL_TABLET | Freq: Two times a day (BID) | ORAL | 0 refills | Status: DC | PRN

## 2023-02-14 ENCOUNTER — Encounter: Payer: Self-pay | Admitting: Orthopedic Surgery

## 2023-02-14 ENCOUNTER — Non-Acute Institutional Stay (SKILLED_NURSING_FACILITY): Payer: Medicare Other | Admitting: Orthopedic Surgery

## 2023-02-14 DIAGNOSIS — R2681 Unsteadiness on feet: Secondary | ICD-10-CM

## 2023-02-14 DIAGNOSIS — U071 COVID-19: Secondary | ICD-10-CM

## 2023-02-14 DIAGNOSIS — I9589 Other hypotension: Secondary | ICD-10-CM | POA: Diagnosis not present

## 2023-02-14 DIAGNOSIS — I5189 Other ill-defined heart diseases: Secondary | ICD-10-CM

## 2023-02-14 DIAGNOSIS — E7800 Pure hypercholesterolemia, unspecified: Secondary | ICD-10-CM | POA: Diagnosis not present

## 2023-02-14 DIAGNOSIS — F419 Anxiety disorder, unspecified: Secondary | ICD-10-CM | POA: Diagnosis not present

## 2023-02-14 DIAGNOSIS — I2583 Coronary atherosclerosis due to lipid rich plaque: Secondary | ICD-10-CM

## 2023-02-14 DIAGNOSIS — I2510 Atherosclerotic heart disease of native coronary artery without angina pectoris: Secondary | ICD-10-CM

## 2023-02-14 DIAGNOSIS — F02B18 Dementia in other diseases classified elsewhere, moderate, with other behavioral disturbance: Secondary | ICD-10-CM

## 2023-02-14 DIAGNOSIS — I480 Paroxysmal atrial fibrillation: Secondary | ICD-10-CM

## 2023-02-14 DIAGNOSIS — D509 Iron deficiency anemia, unspecified: Secondary | ICD-10-CM | POA: Diagnosis not present

## 2023-02-14 DIAGNOSIS — F32A Depression, unspecified: Secondary | ICD-10-CM

## 2023-02-14 DIAGNOSIS — E861 Hypovolemia: Secondary | ICD-10-CM | POA: Diagnosis not present

## 2023-02-14 DIAGNOSIS — G301 Alzheimer's disease with late onset: Secondary | ICD-10-CM

## 2023-02-14 NOTE — Progress Notes (Signed)
Location:   [place] Room Number: 30-A Place of Service:  SNF [phone]) Provider:  Windell Moulding, NP  PCP: [name], MD  Patient Care Team: [name], MD as PCP - General (Internal Medicine) Martinique, Peter M, MD as PCP - Cardiology (Cardiology)  Extended Emergency Contact Information Primary Emergency Contact: [name],[name] Address: [address]          [place] [address] [name] of [place] Phone: [phone] Mobile Phone: [phone] Relation: Sister Secondary Emergency Contact: [name],[name]  [name] of [place] Phone: [phone] Mobile Phone: [phone] Relation: Niece  Code Status:  DNR Goals of care: Advanced Directive information    02/14/2023   10:18 AM  Advanced Directives  Does Patient Have a Medical Advance Directive? Yes  Type of Paramedic of [place];Living will;Out of facility DNR (pink MOST or yellow form)  Does patient want to make changes to medical advance directive? No - Patient declined  Copy of [place] in Chart? Yes - validated most recent copy scanned in chart (See row information)     Chief Complaint  Patient presents with   Medical Management of Chronic Issues    Routine Visit.    Health Maintenance    Discuss the need for AWV.    HPI:  Pt is a 89 y.o. female seen today for medical management of chronic diseases.    She currently resides on the skilled living unit at [facility]. PMH: aortic stenosis,CAD, HTN, HLD, atrial fib, emphysema, GERD, rib fracture (Left 3rd/10th), compression fracture (T3/T7/T11) vascular dementia, and depression.     Covid- 02/01 positive covid test, completed molnupiravir, no long term symptoms, off isolation Hypotension- SBP 80's while on covid isolation, HCTZ/amlodipine/and losartan discontinued, see trends below PAF- HR controlled with metoprolol, off anticoagulation due to falls CAD- hx demand  ischemia, stent placement 1992 & 2005, remains on metoprolol and statin HLD- LDL 88 12/21/2022, remains on statin Grade I diastolic dysfunction- 0000000 echo LVEF 65-70% Dementia- 04/2021 CT head noted chronic vascular changes, MMSE 16/30, behavioral outbursts in evening, Zyprexa recently discontinued due to somnolence, remains on Depakote, ativan and Sertraline Unstable gait- ambulates with w/c, poor safety awareness due to dementia Anemia- hgb 13.1 02/01/2023, remains on ferrous sulfate and Protonix Anxiety/Depression- no mood changes, Na+ 141 02/01/2023, remains on sertraline Weight loss- see below  Recent blood pressures:  02/21- 121/63  02/20- 118/76, 121/72  02/19- 110/76, 127/89  Recent weights:  02/01- 113.6 lbs  01/09- 117.8 lbs  12/02- 120.5 lbs      Past Medical History:  Diagnosis Date   Anxiety and depression    Aortic stenosis    a. 03/2014: Mild aortic stenosis by valve area and mean gradient though appeared more visually consistent with moderate AS.    CAD (coronary artery disease)    a. s/p prior PCI of D2 in 1992 b. stent to RCA in 2005   Diverticula of colon    GERD (gastroesophageal reflux disease)    HTN (hypertension)    Hypercholesteremia    Lateral myocardial infarction [facility]) 1992   Osteoarthritis    Osteopenia    Scoliosis    Past Surgical History:  Procedure Laterality Date   ABDOMINAL HYSTERECTOMY     APPENDECTOMY     CORONARY ANGIOPLASTY     TONSILLECTOMY      Allergies  Allergen Reactions   Latex Other (See Comments)    Unknown reaction - listed  on [facility] 02/10/22   Nifedipine Other (See Comments)    Dark Urine; can only take orange tablet, allergic to yellow-brown tablet   Tetanus Toxoids Other (See Comments)    Unknown reaction   Tetanus-Diphtheria Toxoids Td Other (See Comments)    Unknown reaction   Adhesive [Tape] Rash    Allergies as of 02/14/2023       Reactions   Latex Other (See Comments)   Unknown reaction - listed on  [facility] 02/10/22   Nifedipine Other (See Comments)   Dark Urine; can only take orange tablet, allergic to yellow-brown tablet   Tetanus Toxoids Other (See Comments)   Unknown reaction   Tetanus-diphtheria Toxoids Td Other (See Comments)   Unknown reaction   Adhesive [tape] Rash        Medication List        Accurate as of February 14, 2023 10:19 AM. If you have any questions, ask your nurse or doctor.          STOP taking these medications    cetirizine 10 MG tablet Commonly known as: ZYRTEC Stopped by: [name], NP   potassium chloride 10 MEQ tablet Commonly known as: KLOR-CON M Stopped by: [name], NP       TAKE these medications    acetaminophen 500 MG tablet Commonly known as: TYLENOL Take 500 mg by mouth in the morning, at noon, and at bedtime.   atorvastatin 10 MG tablet Commonly known as: LIPITOR Take 10 mg by mouth at bedtime.   cholecalciferol 25 MCG (1000 UT) tablet Generic drug: Cholecalciferol Take 2,000 Units by mouth every evening.   divalproex 125 MG capsule Commonly known as: DEPAKOTE SPRINKLE Take 125 mg by mouth 2 (two) times daily.   ferrous sulfate 325 (65 FE) MG tablet Take 325 mg by mouth daily with breakfast.   FOLIC ACID PO Take [id number] mcg by mouth daily. What changed: Another medication with the same name was removed. Continue taking this medication, and follow the directions you see here. Changed by: [name], NP   loperamide 2 MG tablet Commonly known as: IMODIUM A-D Take 2 mg by mouth as needed for diarrhea or loose stools.   LORazepam 0.5 MG tablet Commonly known as: ATIVAN Take 0.5 mg by mouth daily. For anxiety and agitation   LORazepam 0.5 MG tablet Commonly known as: ATIVAN Take 1 tablet (0.5 mg total) by mouth every 12 (twelve) hours as needed.   melatonin 3 MG Tabs tablet Take 3 mg by mouth at bedtime.   methocarbamol 500 MG tablet Commonly known as: ROBAXIN Take 250 mg by mouth every 6 (six) hours as  needed for muscle spasms.   metoprolol succinate 50 MG 24 hr tablet Commonly known as: TOPROL-XL Take 50 mg by mouth every evening. Take with or immediately following a meal.   nitroGLYCERIN 0.4 MG SL tablet Commonly known as: NITROSTAT Place 0.4 mg under the tongue every 5 (five) minutes as needed for chest pain. As needed fo chest pain   oxyCODONE-acetaminophen 5-325 MG tablet Commonly known as: PERCOCET/ROXICET Take 0.5 tablets by mouth every 12 (twelve) hours as needed for severe pain.   pantoprazole 40 MG tablet Commonly known as: PROTONIX Take 40 mg by mouth daily.   PEG 3350 17 GM/SCOOP Powd Give 17 gram by mouth one time a day every 2 day(s) for constipation   senna 8.6 MG Tabs tablet Commonly known as: SENOKOT Take 1 tablet by mouth in the morning.  sertraline 25 MG tablet Commonly known as: ZOLOFT Take 50 mg by mouth at bedtime.        Review of Systems  Unable to perform ROS: Dementia    Immunization History  Administered Date(s) Administered   Fluad Quad(high Dose 65+) 10/18/2022   Influenza Split 05/13/2009, 05/18/2010, 09/29/2011, 09/05/2012, 09/03/2013   Influenza, High Dose Seasonal PF 09/09/2015, 08/30/2017   Influenza, Quadrivalent, Recombinant, Inj, Pf 08/19/2018, 09/29/2019   Influenza-Unspecified 10/18/2021   Janssen (J&J) SARS-COV-2 Vaccination 11/20/2022   Moderna Covid-19 Vaccine Bivalent Booster 82yr & up 09/14/2021, 06/07/2022   Moderna SARS-COV2 Booster Vaccination 11/08/2020, 06/01/2021   Moderna Sars-Covid-2 Vaccination 12/29/2019, 01/26/2020   PFIZER(Purple Top)SARS-COV-2 Vaccination 10/31/2022   Pfizer Covid-19 Vaccine Bivalent Booster 19yr& up 09/14/2020   Pneumococcal Conjugate-13 07/08/2013   Pneumococcal Polysaccharide-23 06/29/2006, 05/13/2009, 05/18/2010, 05/25/2011   Td 05/13/2009, 05/18/2010, 05/25/2011   Tdap 03/20/2022   Zoster Recombinat (Shingrix) 08/24/2017, 10/31/2017   Zoster, Live 08/22/2008, 08/24/2008,  05/13/2009, 05/18/2010, 05/25/2011   Pertinent  Health Maintenance Due  Topic Date Due   INFLUENZA VACCINE  Completed   DEXA SCAN  Completed      12/01/2022   11:44 AM 12/07/2022   10:23 AM 12/07/2022   10:24 AM 12/11/2022   10:46 AM 01/01/2023   10:38 AM  Fall Risk  Falls in the past year? 1 1 1 1 $ 0  Was there an injury with Fall? 0 0 0 0 0  Fall Risk Category Calculator 1 1 1 1 $ 0  Fall Risk Category (Retired) Low Low Low Low Low  (RETIRED) Patient Fall Risk Level High fall risk Low fall risk Low fall risk Low fall risk Low fall risk  Patient at Risk for Falls Due to History of fall(s) History of fall(s)  History of fall(s) History of fall(s)  Fall risk Follow up Falls evaluation completed Falls evaluation completed  Falls evaluation completed Falls evaluation completed   Functional Status Survey:    Vitals:   02/14/23 1012  BP: 121/63  Pulse: 88  Resp: (!) 22  Temp: (!) 97.2 F (36.2 C)  SpO2: 94%  Weight: 117 lb 12.8 oz (53.4 kg)  Height: 5' 4"$  (1.626 m)   Body mass index is 20.22 kg/m. Physical Exam Vitals reviewed.  Constitutional:      General: She is not in acute distress. HENT:     Head: Normocephalic.     Right Ear: There is no impacted cerumen.     Left Ear: There is no impacted cerumen.     Nose: Nose normal.     Mouth/Throat:     Mouth: Mucous membranes are moist.  Eyes:     General:        Right eye: No discharge.        Left eye: No discharge.  Cardiovascular:     Rate and Rhythm: Normal rate. Rhythm irregular.     Pulses: Normal pulses.     Heart sounds: Normal heart sounds.  Pulmonary:     Effort: Pulmonary effort is normal. No respiratory distress.     Breath sounds: Normal breath sounds. No wheezing or rales.  Abdominal:     General: Bowel sounds are normal. There is no distension.     Palpations: Abdomen is soft.     Tenderness: There is no abdominal tenderness.  Musculoskeletal:     Cervical back: Neck supple.     Right lower leg: No  edema.     Left lower leg: No edema.  Lymphadenopathy:  Cervical: No cervical adenopathy.  Skin:    General: Skin is warm and dry.     Capillary Refill: Capillary refill takes less than 2 seconds.     Comments: Approx < 0.5 cm skin tear to left dorsal hand, CDI, surrounding skin intact  Neurological:     General: No focal deficit present.     Mental Status: She is alert. Mental status is at baseline.     Motor: Weakness present.     Gait: Gait abnormal.     Comments: wheelchair  Psychiatric:        Mood and Affect: Mood normal.     Labs reviewed: Recent Labs    09/08/22 0422 09/11/22 0730 11/02/22 0000 02/01/23 0000  NA 134* 135 141 141  K 3.2* 3.4* 3.8 3.9  CL 101 96* 104 106  CO2 25 29 31* 25*  GLUCOSE 106* 67  --   --   BUN 10 16 11 $ 22*  CREATININE 0.38* 0.62 0.4* 0.5  CALCIUM 8.9 9.0 8.7 8.7   Recent Labs    09/11/22 0730 11/02/22 0000  AST 18 16  ALT 11 10  ALKPHOS  --  128*  BILITOT 0.6  --   PROT 6.6  --   ALBUMIN  --  3.6   Recent Labs    03/09/22 0000 06/05/22 0000 09/08/22 0422 09/11/22 0730 11/02/22 0000 12/21/22 0840 02/01/23 0000  WBC 3.7   < > 6.7 5.5 4.3 5.5 10.3  NEUTROABS 1,550.00  --  5.0 3,801  --   --   --   HGB 10.7*   < > 14.1 14.5 13.4 14.1 13.1  HCT 36   < > 43.2 42.1 41 42 39  MCV  --   --  95.6 89.6  --   --   --   PLT 266   < > 220 236 209 260 207   < > = values in this interval not displayed.   Lab Results  Component Value Date   TSH 2.90 11/24/2021   No results found for: "HGBA1C" Lab Results  Component Value Date   CHOL 165 12/21/2022   HDL 47 12/21/2022   LDLCALC 88 12/21/2022   TRIG 206 (A) 12/21/2022   CHOLHDL 1.9 09/17/2020    Significant Diagnostic Results in last 30 days:  No results found.  Assessment/Plan 1. COVID-19 virus infection - tested positive 02/01 - completed molnupiravir - no long term symptoms - off isolation  2. Hypotension due to hypovolemia - SBP 80's during Covid  isolation - HCTZ/amlodipine/losartan discontinued  3. PAF (paroxysmal atrial fibrillation) (HCC) - HR< 100 on metoprolol - off anticoagulation due to falls  4. Coronary artery disease due to lipid rich plaque - cont atorvastatin  5. Hypercholesteremia - cont atorvastatin  6. Grade I diastolic dysfunction  7. Moderate late onset Alzheimer's dementia with other behavioral disturbance (HCC) - sundowning behaviors - Zyprexa discontinued due to somnolence - ambulates with wheelchair - dependent with ADLs except eating - cont Depakote, ativan and Zoloft  8. Unstable gait - poor safety awareness due to dementia - cont skilled nursing care  9. Iron deficiency anemia, unspecified iron deficiency anemia type - hgb stable - cont Protonix  10. Anxiety and depression - no mood changes - cont ativan and Zoloft    Family/ staff Communication: plan discussed with patient and nurse  Labs/tests ordered: none

## 2023-02-16 ENCOUNTER — Other Ambulatory Visit: Payer: Self-pay | Admitting: Orthopedic Surgery

## 2023-02-16 DIAGNOSIS — F02B18 Dementia in other diseases classified elsewhere, moderate, with other behavioral disturbance: Secondary | ICD-10-CM

## 2023-02-16 MED ORDER — OLANZAPINE 5 MG PO TABS: 5.0000 mg | ORAL_TABLET | Freq: Two times a day (BID) | ORAL | 0 refills | Status: DC

## 2023-02-16 NOTE — Progress Notes (Signed)
H/o Alzheimers disease with increased behaviors.She recently recovered from covid. 02/07 increased somnolence> Zyprexa was discontinued. This afternoon nursing reports increased sundowning and behaviors of yelling and exit seeking. She has received ativan and depakote today. Will restart Zyprexa since somnolence has resolved.

## 2023-02-19 ENCOUNTER — Encounter: Payer: Self-pay | Admitting: Orthopedic Surgery

## 2023-02-19 ENCOUNTER — Non-Acute Institutional Stay (SKILLED_NURSING_FACILITY): Payer: Medicare Other | Admitting: Orthopedic Surgery

## 2023-02-19 DIAGNOSIS — R400 Somnolence: Secondary | ICD-10-CM

## 2023-02-19 DIAGNOSIS — F02B18 Dementia in other diseases classified elsewhere, moderate, with other behavioral disturbance: Secondary | ICD-10-CM

## 2023-02-19 DIAGNOSIS — G301 Alzheimer's disease with late onset: Secondary | ICD-10-CM | POA: Diagnosis not present

## 2023-02-19 MED ORDER — ZYPREXA 5 MG PO TABS: 2.5000 mg | ORAL_TABLET | Freq: Every day | ORAL | 0 refills | Status: DC

## 2023-02-19 NOTE — Progress Notes (Signed)
Location:   [place] Room Number: 30-A Place of Service:  SNF [phone]) Provider:  Windell Moulding, NP  PCP: [name], MD  Patient Care Team: [name], MD as PCP - General (Internal Medicine) [place]ique, Peter M, MD as PCP - Cardiology (Cardiology)  Extended Emergency Contact Information Primary Emergency Contact: [name],[name] Address: [address]          [place] [address] [name] of [place] Phone: [phone] Mobile Phone: [phone] Relation: Sister Secondary Emergency Contact: [name],[name]  [name] of [place] Phone: [phone] Mobile Phone: [phone] Relation: Niece  Code Status: DNR  Goals of care: Advanced Directive information    02/19/2023    9:41 AM  Advanced Directives  Does Patient Have a Medical Advance Directive? Yes  Type of Paramedic of [place];Living will;Out of facility DNR (pink MOST or yellow form)  Does patient want to make changes to medical advance directive? No - Patient declined  Copy of [place] in Chart? Yes - validated most recent copy scanned in chart (See row information)     Chief Complaint  Patient presents with   Acute Visit    Agitation.     HPI:  Pt is a 89 y.o. female seen today for an acute visit for somnolence.   She currently resides on the skilled living unit at [facility]. PMH: aortic stenosis,CAD, HTN, HLD, atrial fib, emphysema, GERD, rib fracture (Left 3rd/10th), compression fracture (T3/T7/T11) vascular dementia, and depression.    02/07 she was seen for increased somnolence> CXR and labs unremarkable. Zyprexa was discontinued. Somnolence improved with medication adjustment. 02/23 she was exit seeking and had increased agitation in evening. She was restarted on Zyprexa.Today, nursing reports increased sleep over the weekend. This morning she appears groggy. She is able to eat breakfast on her own, but did  not eat much. Exit seeking behavior and agitation improved over weekend. Afebrile. Vitals stable.   Past Surgical History:  Procedure Laterality Date   ABDOMINAL HYSTERECTOMY     APPENDECTOMY     CORONARY ANGIOPLASTY     TONSILLECTOMY      Allergies  Allergen Reactions   Latex Other (See Comments)    Unknown reaction - listed on [facility] 02/10/22   Nifedipine Other (See Comments)    Dark Urine; can only take orange tablet, allergic to yellow-brown tablet   Tetanus Toxoids Other (See Comments)    Unknown reaction   Tetanus-Diphtheria Toxoids Td Other (See Comments)    Unknown reaction   Adhesive [Tape] Rash    Allergies as of 02/19/2023       Reactions   Latex Other (See Comments)   Unknown reaction - listed on [facility] 02/10/22   Nifedipine Other (See Comments)   Dark Urine; can only take orange tablet, allergic to yellow-brown tablet   Tetanus Toxoids Other (See Comments)   Unknown reaction   Tetanus-diphtheria Toxoids Td Other (See Comments)   Unknown reaction   Adhesive [tape] Rash        Medication List        Accurate as of February 19, 2023  9:42 AM. If you have any questions, ask your nurse or doctor.          acetaminophen 500 MG tablet Commonly known as: TYLENOL Take 500 mg by mouth in the morning, at noon, and at bedtime.   atorvastatin 10 MG tablet Commonly known as: LIPITOR Take 10 mg by mouth  every evening.   cetirizine 10 MG tablet Commonly known as: ZYRTEC Take 10 mg by mouth daily.   cholecalciferol 25 MCG (1000 UT) tablet Generic drug: Cholecalciferol Take 2,000 Units by mouth every evening.   divalproex 125 MG capsule Commonly known as: DEPAKOTE SPRINKLE Take 125 mg by mouth 2 (two) times daily.   ferrous sulfate 325 (65 FE) MG tablet Take 325 mg by mouth daily with breakfast.   FOLIC ACID PO Take [id number] mcg by mouth daily.   loperamide 2 MG tablet Commonly known as: IMODIUM A-D Take 2 mg by mouth as needed for diarrhea or loose  stools.   LORazepam 0.5 MG tablet Commonly known as: ATIVAN Take 0.5 mg by mouth daily. For anxiety and agitation   LORazepam 0.5 MG tablet Commonly known as: ATIVAN Take 1 tablet (0.5 mg total) by mouth every 12 (twelve) hours as needed.   melatonin 3 MG Tabs tablet Take 3 mg by mouth at bedtime.   methocarbamol 500 MG tablet Commonly known as: ROBAXIN Take 250 mg by mouth every 6 (six) hours as needed for muscle spasms.   metoprolol succinate 50 MG 24 hr tablet Commonly known as: TOPROL-XL Take 50 mg by mouth every evening. Take with or immediately following a meal.   nitroGLYCERIN 0.4 MG SL tablet Commonly known as: NITROSTAT Place 0.4 mg under the tongue every 5 (five) minutes as needed for chest pain. As needed fo chest pain   OLANZapine 5 MG tablet Commonly known as: ZyPREXA Take 1 tablet (5 mg total) by mouth 2 (two) times daily.   oxyCODONE-acetaminophen 5-325 MG tablet Commonly known as: PERCOCET/ROXICET Take 0.5 tablets by mouth every 12 (twelve) hours as needed for severe pain.   pantoprazole 40 MG tablet Commonly known as: PROTONIX Take 40 mg by mouth daily.   PEG 3350 17 GM/SCOOP Powd Give 17 gram by mouth one time a day every 2 day(s) for constipation   senna 8.6 MG Tabs tablet Commonly known as: SENOKOT Take 1 tablet by mouth in the morning.   sertraline 25 MG tablet Commonly known as: ZOLOFT Take 50 mg by mouth at bedtime.        Review of Systems  Immunization History  Administered Date(s) Administered   Fluad Quad(high Dose 65+) 10/18/2022   Influenza Split 05/13/2009, 05/18/2010, 09/29/2011, 09/05/2012, 09/03/2013   Influenza, High Dose Seasonal PF 09/09/2015, 08/30/2017   Influenza, Quadrivalent, Recombinant, Inj, Pf 08/19/2018, 09/29/2019   Influenza-Unspecified 10/18/2021   Janssen (J&J) SARS-COV-2 Vaccination 11/20/2022   Moderna Covid-19 Vaccine Bivalent Booster 85yr & up 09/14/2021, 06/07/2022   Moderna SARS-COV2 Booster  Vaccination 11/08/2020, 06/01/2021   Moderna Sars-Covid-2 Vaccination 12/29/2019, 01/26/2020   PFIZER(Purple Top)SARS-COV-2 Vaccination 10/31/2022   Pfizer Covid-19 Vaccine Bivalent Booster 150yr& up 09/14/2020   Pneumococcal Conjugate-13 07/08/2013   Pneumococcal Polysaccharide-23 06/29/2006, 05/13/2009, 05/18/2010, 05/25/2011   Td 05/13/2009, 05/18/2010, 05/25/2011   Tdap 03/20/2022   Zoster Recombinat (Shingrix) 08/24/2017, 10/31/2017   Zoster, Live 08/22/2008, 08/24/2008, 05/13/2009, 05/18/2010, 05/25/2011   Pertinent  Health Maintenance Due  Topic Date Due   INFLUENZA VACCINE  Completed   DEXA SCAN  Completed      12/01/2022   11:44 AM 12/07/2022   10:23 AM 12/07/2022   10:24 AM 12/11/2022   10:46 AM 01/01/2023   10:38 AM  Fall Risk  Falls in the past year? '1 1 1 1 '$ 0  Was there an injury with Fall? 0 0 0 0 0  Fall Risk Category Calculator 1 1 1  1 0  Fall Risk Category (Retired) Low Low Low Low Low  (RETIRED) Patient Fall Risk Level High fall risk Low fall risk Low fall risk Low fall risk Low fall risk  Patient at Risk for Falls Due to History of fall(s) History of fall(s)  History of fall(s) History of fall(s)  Fall risk Follow up Falls evaluation completed Falls evaluation completed  Falls evaluation completed Falls evaluation completed   Functional Status Survey:    Vitals:   02/19/23 0936  BP: (!) 142/68  Pulse: 78  Resp: (!) 22  Temp: (!) 97.2 F (36.2 C)  SpO2: 92%  Weight: 117 lb 12.8 oz (53.4 kg)  Height: '5\' 4"'$  (1.626 m)   Body mass index is 20.22 kg/m. Physical Exam Vitals reviewed.  Constitutional:      General: She is not in acute distress. HENT:     Head: Normocephalic.  Eyes:     General:        Right eye: No discharge.        Left eye: No discharge.     Pupils: Pupils are equal, round, and reactive to light.  Cardiovascular:     Rate and Rhythm: Normal rate and regular rhythm.     Pulses: Normal pulses.     Heart sounds: Normal heart  sounds.  Pulmonary:     Effort: Pulmonary effort is normal. No respiratory distress.     Breath sounds: Normal breath sounds. No wheezing.  Abdominal:     General: Bowel sounds are normal. There is no distension.     Palpations: Abdomen is soft.     Tenderness: There is no abdominal tenderness.  Musculoskeletal:     Cervical back: Neck supple.     Right lower leg: No edema.     Left lower leg: No edema.  Skin:    General: Skin is warm and dry.     Capillary Refill: Capillary refill takes less than 2 seconds.  Neurological:     General: No focal deficit present.     Mental Status: She is alert. Mental status is at baseline.     Motor: Weakness present.     Gait: Gait abnormal.  Psychiatric:        Mood and Affect: Mood normal.        Behavior: Behavior normal.     Comments: Able to answer simple questions     Labs reviewed: Recent Labs    09/08/22 0422 09/11/22 0730 11/02/22 0000 02/01/23 0000  NA 134* 135 141 141  K 3.2* 3.4* 3.8 3.9  CL 101 96* 104 106  CO2 25 29 31* 25*  GLUCOSE 106* 67  --   --   BUN '10 16 11 '$ 22*  CREATININE 0.38* 0.62 0.4* 0.5  CALCIUM 8.9 9.0 8.7 8.7   Recent Labs    09/11/22 0730 11/02/22 0000  AST 18 16  ALT 11 10  ALKPHOS  --  128*  BILITOT 0.6  --   PROT 6.6  --   ALBUMIN  --  3.6   Recent Labs    03/09/22 0000 06/05/22 0000 09/08/22 0422 09/11/22 0730 11/02/22 0000 12/21/22 0840 02/01/23 0000  WBC 3.7   < > 6.7 5.5 4.3 5.5 10.3  NEUTROABS 1,550.00  --  5.0 3,801  --   --   --   HGB 10.7*   < > 14.1 14.5 13.4 14.1 13.1  HCT 36   < > 43.2 42.1 41 42 39  MCV  --   --  95.6 89.6  --   --   --   PLT 266   < > 220 236 209 260 207   < > = values in this interval not displayed.   Lab Results  Component Value Date   TSH 2.90 11/24/2021   No results found for: "HGBA1C" Lab Results  Component Value Date   CHOL 165 12/21/2022   HDL 47 12/21/2022   LDLCALC 88 12/21/2022   TRIG 206 (A) 12/21/2022   CHOLHDL 1.9 09/17/2020     Significant Diagnostic Results in last 30 days:  No results found.  Assessment/Plan 1. Somnolence - 02/08> CXR and lab work unremarkable> Zyprexa discontinued - 02/23 increased behaviors and agitation> Zyprexa 5 mg BID restarted - 02/26 increased somnolence - appears tired today, exam unremarkable - discontinue Zyprexa 5 mg AQM - reduce Zyprexa to 2.5 mg- give at 4 pm to reduce sundowning  2. Moderate late onset Alzheimer's dementia with other behavioral disturbance ([place]) - see above - exit seeking and agitation without Zyprexa> increased somnolence with Zyprexa 5 mg BID - ambulates with w/c - dependent with ADLs - cont Depakote, Ativan and Zoloft - reduce Zyprexa to 2.5 mg po daily- give at 4pm    Family/ staff Communication:   Labs/tests ordered:

## 2023-02-20 ENCOUNTER — Other Ambulatory Visit: Payer: Self-pay | Admitting: Adult Health

## 2023-02-20 DIAGNOSIS — F02B18 Dementia in other diseases classified elsewhere, moderate, with other behavioral disturbance: Secondary | ICD-10-CM

## 2023-02-20 DIAGNOSIS — F32A Depression, unspecified: Secondary | ICD-10-CM

## 2023-02-20 MED ORDER — LORAZEPAM 0.5 MG PO TABS: 0.5000 mg | ORAL_TABLET | Freq: Every day | ORAL | 0 refills | Status: DC

## 2023-02-20 MED ORDER — LORAZEPAM 0.5 MG PO TABS: 0.5000 mg | ORAL_TABLET | Freq: Two times a day (BID) | ORAL | 0 refills | Status: DC | PRN

## 2023-02-22 ENCOUNTER — Non-Acute Institutional Stay (SKILLED_NURSING_FACILITY): Payer: Medicare Other | Admitting: Internal Medicine

## 2023-02-22 ENCOUNTER — Encounter: Payer: Self-pay | Admitting: Internal Medicine

## 2023-02-22 DIAGNOSIS — D509 Iron deficiency anemia, unspecified: Secondary | ICD-10-CM

## 2023-02-22 DIAGNOSIS — G301 Alzheimer's disease with late onset: Secondary | ICD-10-CM | POA: Diagnosis not present

## 2023-02-22 DIAGNOSIS — F419 Anxiety disorder, unspecified: Secondary | ICD-10-CM | POA: Diagnosis not present

## 2023-02-22 DIAGNOSIS — F32A Depression, unspecified: Secondary | ICD-10-CM

## 2023-02-22 DIAGNOSIS — I10 Essential (primary) hypertension: Secondary | ICD-10-CM

## 2023-02-22 DIAGNOSIS — F02B18 Dementia in other diseases classified elsewhere, moderate, with other behavioral disturbance: Secondary | ICD-10-CM | POA: Diagnosis not present

## 2023-02-22 DIAGNOSIS — R1311 Dysphagia, oral phase: Secondary | ICD-10-CM | POA: Diagnosis not present

## 2023-02-22 DIAGNOSIS — R41841 Cognitive communication deficit: Secondary | ICD-10-CM | POA: Diagnosis not present

## 2023-02-22 DIAGNOSIS — F0280 Dementia in other diseases classified elsewhere without behavioral disturbance: Secondary | ICD-10-CM | POA: Insufficient documentation

## 2023-02-22 NOTE — Progress Notes (Signed)
Location:  [place] of Service:  SNF ([id number])  Provider: [name]   Code Status: DNR Goals of Care:     02/19/2023    9:41 AM  Advanced Directives  Does Patient Have a Medical Advance Directive? Yes  Type of Paramedic of [place];Living will;Out of facility DNR (pink MOST or yellow form)  Does patient want to make changes to medical advance directive? No - Patient declined  Copy of [place] in Chart? Yes - validated most recent copy scanned in chart (See row information)     Chief Complaint  Patient presents with   Acute Visit    HPI: Patient is a 89 y.o. female seen today for medical management of chronic diseases.    Lives in SNF   Patient has h/o  has h/o CAD s/p Angioplasty and Stenting in RCA HTN, HLD Moderate Aortic stenosis,  H/o PAF Underwent DCCV No Anticoagulation due to her h/o Falls and Acute Anemia Chronic Back pain Unstable Gait and Falls Sustained Compression Fractures of T 6 and T 10 L3 Fracture Anemia Refused GI work up Dementia   Patient was taken off Zyprexa due to Lethargy But now she is having behavior issues It was restarted at lower dose of 2.5 mg  Today patient is tearful and screaming Very upset No Other issues except her behavior Also tries to get up at night from her bed and has fallen   Past Medical History:  Diagnosis Date   Anxiety and depression    Aortic stenosis    a. 03/2014: Mild aortic stenosis by valve area and mean gradient though appeared more visually consistent with moderate AS.    CAD (coronary artery disease)    a. s/p prior PCI of D2 in 1992 b. stent to RCA in 2005   Diverticula of colon    GERD (gastroesophageal reflux disease)    HTN (hypertension)    Hypercholesteremia    Lateral myocardial infarction ([place]) 1992   Osteoarthritis    Osteopenia    Scoliosis     Past Surgical History:  Procedure Laterality Date   ABDOMINAL HYSTERECTOMY      APPENDECTOMY     CORONARY ANGIOPLASTY     TONSILLECTOMY      Allergies  Allergen Reactions   Latex Other (See Comments)    Unknown reaction - listed on [facility] 02/10/22   Nifedipine Other (See Comments)    Dark Urine; can only take orange tablet, allergic to yellow-brown tablet   Tetanus Toxoids Other (See Comments)    Unknown reaction   Tetanus-Diphtheria Toxoids Td Other (See Comments)    Unknown reaction   Adhesive [Tape] Rash    Outpatient Encounter Medications as of 02/22/2023  Medication Sig   acetaminophen (TYLENOL) 500 MG tablet Take 500 mg by mouth in the morning, at noon, and at bedtime.   atorvastatin (LIPITOR) 10 MG tablet Take 10 mg by mouth every evening.   cetirizine (ZYRTEC) 10 MG tablet Take 10 mg by mouth daily.   cholecalciferol (VITAMIN D) 25 MCG (1000 UNIT) tablet Take 2,000 Units by mouth every evening.   divalproex (DEPAKOTE SPRINKLE) 125 MG capsule Take 125 mg by mouth 2 (two) times daily.   ferrous sulfate 325 (65 FE) MG tablet Take 325 mg by mouth daily with breakfast.   FOLIC ACID PO Take XX123456 mcg by mouth daily.   loperamide (IMODIUM A-D) 2 MG tablet Take 2 mg by mouth as  needed for diarrhea or loose stools.   LORazepam (ATIVAN) 0.5 MG tablet Take 1 tablet (0.5 mg total) by mouth daily. For anxiety and agitation   LORazepam (ATIVAN) 0.5 MG tablet Take 1 tablet (0.5 mg total) by mouth every 12 (twelve) hours as needed.   melatonin 3 MG TABS tablet Take 3 mg by mouth at bedtime.   methocarbamol (ROBAXIN) 500 MG tablet Take 250 mg by mouth every 6 (six) hours as needed for muscle spasms.   metoprolol succinate (TOPROL-XL) 50 MG 24 hr tablet Take 50 mg by mouth every evening. Take with or immediately following a meal.   nitroGLYCERIN (NITROSTAT) 0.4 MG SL tablet Place 0.4 mg under the tongue every 5 (five) minutes as needed for chest pain. As needed fo chest pain   oxyCODONE-acetaminophen (PERCOCET/ROXICET) 5-325 MG tablet Take 0.5 tablets by mouth every 12  (twelve) hours as needed for severe pain.   pantoprazole (PROTONIX) 40 MG tablet Take 40 mg by mouth daily.   Polyethylene Glycol 3350 (PEG 3350) 17 GM/SCOOP POWD Give 17 gram by mouth one time a day every 2 day(s) for constipation   senna (SENOKOT) 8.6 MG TABS tablet Take 1 tablet by mouth in the morning.   sertraline (ZOLOFT) 25 MG tablet Take 50 mg by mouth at bedtime.   ZYPREXA 5 MG tablet Take 0.5 tablets (2.5 mg total) by mouth daily in the afternoon. Give at 4pm for sundowning   No facility-administered encounter medications on file as of 02/22/2023.    Review of Systems:  Review of Systems  Unable to perform ROS: Dementia    Health Maintenance  Topic Date Due   COVID-19 Vaccine (9 - 2023-24 season) 01/15/2023   Medicare Annual Wellness (AWV)  03/21/2023   DTaP/Tdap/Td (5 - Td or Tdap) 03/20/2032   Pneumonia Vaccine 52+ Years old  Completed   INFLUENZA VACCINE  Completed   DEXA SCAN  Completed   Zoster Vaccines- Shingrix  Completed   HPV VACCINES  Aged Out    Physical Exam: Vitals:   02/22/23 1536  BP: 112/88  Pulse: 100  Resp: 16  Temp: (!) 97.1 F (36.2 C)  Weight: 118 lb (53.5 kg)   Body mass index is 20.25 kg/m. Physical Exam Vitals reviewed.  Constitutional:      Appearance: Normal appearance.     Comments: Screaming and tearful Very upset  HENT:     Head: Normocephalic.     Nose: Nose normal.     Mouth/Throat:     Mouth: Mucous membranes are moist.     Pharynx: Oropharynx is clear.  Eyes:     Pupils: Pupils are equal, round, and reactive to light.  Cardiovascular:     Rate and Rhythm: Normal rate. Rhythm irregular.     Pulses: Normal pulses.     Heart sounds: Normal heart sounds. No murmur heard. Pulmonary:     Effort: Pulmonary effort is normal.     Breath sounds: Normal breath sounds.  Abdominal:     General: Abdomen is flat. Bowel sounds are normal.     Palpations: Abdomen is soft.  Musculoskeletal:        General: No swelling.      Cervical back: Neck supple.  Skin:    General: Skin is warm.  Neurological:     General: No focal deficit present.     Mental Status: She is alert.  Psychiatric:        Mood and Affect: Mood normal.  Thought Content: Thought content normal.     Labs reviewed: Basic Metabolic Panel: Recent Labs    09/08/22 0422 09/11/22 0730 11/02/22 0000 02/01/23 0000  NA 134* 135 141 141  K 3.2* 3.4* 3.8 3.9  CL 101 96* 104 106  CO2 25 29 31* 25*  GLUCOSE 106* 67  --   --   BUN '10 16 11 '$ 22*  CREATININE 0.38* 0.62 0.4* 0.5  CALCIUM 8.9 9.0 8.7 8.7   Liver Function Tests: Recent Labs    09/11/22 0730 11/02/22 0000  AST 18 16  ALT 11 10  ALKPHOS  --  128*  BILITOT 0.6  --   PROT 6.6  --   ALBUMIN  --  3.6   No results for input(s): "LIPASE", "AMYLASE" in the last 8760 hours. No results for input(s): "AMMONIA" in the last 8760 hours. CBC: Recent Labs    03/09/22 0000 06/05/22 0000 09/08/22 0422 09/11/22 0730 11/02/22 0000 12/21/22 0840 02/01/23 0000  WBC 3.7   < > 6.7 5.5 4.3 5.5 10.3  NEUTROABS 1,550.00  --  5.0 3,801  --   --   --   HGB 10.7*   < > 14.1 14.5 13.4 14.1 13.1  HCT 36   < > 43.2 42.1 41 42 39  MCV  --   --  95.6 89.6  --   --   --   PLT 266   < > 220 236 209 260 207   < > = values in this interval not displayed.   Lipid Panel: Recent Labs    12/21/22 0840  CHOL 165  HDL 47  LDLCALC 88  TRIG 206*   No results found for: "HGBA1C"  Procedures since last visit: No results found.  Assessment/Plan 1. Anxiety and depression Will change her Zoloft to 100 mg QD Also Schedule Ativan at 3pm main time she gets anxious  2. Moderate late onset Alzheimer's dementia with other behavioral disturbance ([place]) Continue sot have issues with Behaviors On Depakote and Zyprexa She did not tolerate higher dose and is on lower dose now Also Speech Working with her 3. Primary hypertension Off all meds  BP staying good 4. Iron deficiency anemia, unspecified  iron deficiency anemia type Refused work up HGB stays stable on Iron 5 Lower back pain Continue Tylenol Discontinue Robaxin and Norco to avoid Lethargy   Labs/tests ordered:    [name], MD

## 2023-02-24 ENCOUNTER — Other Ambulatory Visit: Payer: Self-pay

## 2023-02-24 ENCOUNTER — Encounter (HOSPITAL_COMMUNITY): Payer: Self-pay

## 2023-02-24 ENCOUNTER — Emergency Department (HOSPITAL_COMMUNITY)
Admission: EM | Admit: 2023-02-24 | Discharge: 2023-02-24 | Disposition: A | Discharge status: Skilled Nursing Facility | Payer: Medicare Other | Attending: Emergency Medicine | Admitting: Emergency Medicine

## 2023-02-24 DIAGNOSIS — Z79899 Other long term (current) drug therapy: Secondary | ICD-10-CM | POA: Diagnosis not present

## 2023-02-24 DIAGNOSIS — Z91040 Latex allergy status: Secondary | ICD-10-CM | POA: Diagnosis not present

## 2023-02-24 DIAGNOSIS — S81812A Laceration without foreign body, left lower leg, initial encounter: Secondary | ICD-10-CM | POA: Diagnosis not present

## 2023-02-24 DIAGNOSIS — M79673 Pain in unspecified foot: Secondary | ICD-10-CM | POA: Diagnosis not present

## 2023-02-24 DIAGNOSIS — S8992XA Unspecified injury of left lower leg, initial encounter: Secondary | ICD-10-CM | POA: Diagnosis present

## 2023-02-24 DIAGNOSIS — R4182 Altered mental status, unspecified: Secondary | ICD-10-CM | POA: Diagnosis not present

## 2023-02-24 DIAGNOSIS — Z7401 Bed confinement status: Secondary | ICD-10-CM | POA: Diagnosis not present

## 2023-02-24 DIAGNOSIS — R404 Transient alteration of awareness: Secondary | ICD-10-CM | POA: Diagnosis not present

## 2023-02-24 DIAGNOSIS — R58 Hemorrhage, not elsewhere classified: Secondary | ICD-10-CM | POA: Diagnosis not present

## 2023-02-24 MED ORDER — LIDOCAINE-EPINEPHRINE-TETRACAINE (LET) TOPICAL GEL
3.0000 mL | Freq: Once | TOPICAL | Status: AC
  Administered 2023-02-24: 3 mL via TOPICAL
  Filled 2023-02-24: qty 3

## 2023-02-24 MED ORDER — LIDOCAINE-EPINEPHRINE (PF) 2 %-1:200000 IJ SOLN
10.0000 mL | Freq: Once | INTRAMUSCULAR | Status: AC
  Administered 2023-02-24: 10 mL
  Filled 2023-02-24: qty 20

## 2023-02-24 MED ORDER — QUETIAPINE FUMARATE 25 MG PO TABS: 25.0000 mg | ORAL_TABLET | Freq: Every day | ORAL | Status: DC

## 2023-02-24 MED ORDER — METOPROLOL TARTRATE 25 MG PO TABS
12.5000 mg | ORAL_TABLET | Freq: Once | ORAL | Status: AC
  Administered 2023-02-24: 12.5 mg via ORAL
  Filled 2023-02-24: qty 1

## 2023-02-24 MED ORDER — QUETIAPINE FUMARATE 25 MG PO TABS
25.0000 mg | ORAL_TABLET | Freq: Once | ORAL | Status: AC
  Administered 2023-02-24: 25 mg via ORAL
  Filled 2023-02-24: qty 1

## 2023-02-24 NOTE — ED Notes (Signed)
Laceration covered with telfa, and wrapped with stretch bandage. Distal CMS intact.

## 2023-02-24 NOTE — ED Notes (Signed)
LET gel applied to leg lac at this time.

## 2023-02-24 NOTE — ED Notes (Signed)
Ptar called for pt- Pt to address on file

## 2023-02-24 NOTE — Discharge Instructions (Addendum)
You were seen in the ED for a laceration to the left lower leg. Thankfully we were able to flush the wound and place sutures into the area to allow for improved healing. The sutures should remain in place for the next 7-10 depending on how well the laceration is healing. Please return to the ED for any signs of infection at the wound including red, warmth, discharge, or significant skin color changes.
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
## 2023-02-24 NOTE — ED Notes (Signed)
Pt report received from previous nurse. Pt A&O x1 at baseline. vitals stable, denies needs/complaints. Call bell in reach. No acute distress noted.
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
## 2023-02-26 ENCOUNTER — Encounter: Payer: Self-pay | Admitting: Orthopedic Surgery

## 2023-02-26 ENCOUNTER — Non-Acute Institutional Stay (SKILLED_NURSING_FACILITY): Payer: Medicare Other | Admitting: Orthopedic Surgery

## 2023-02-26 DIAGNOSIS — F419 Anxiety disorder, unspecified: Secondary | ICD-10-CM

## 2023-02-26 DIAGNOSIS — F32A Depression, unspecified: Secondary | ICD-10-CM

## 2023-02-26 DIAGNOSIS — M79672 Pain in left foot: Secondary | ICD-10-CM | POA: Diagnosis not present

## 2023-02-26 DIAGNOSIS — G301 Alzheimer's disease with late onset: Secondary | ICD-10-CM | POA: Diagnosis not present

## 2023-02-26 DIAGNOSIS — S81812A Laceration without foreign body, left lower leg, initial encounter: Secondary | ICD-10-CM | POA: Diagnosis not present

## 2023-02-26 DIAGNOSIS — R1311 Dysphagia, oral phase: Secondary | ICD-10-CM | POA: Diagnosis not present

## 2023-02-26 DIAGNOSIS — M79671 Pain in right foot: Secondary | ICD-10-CM

## 2023-02-26 DIAGNOSIS — F02B18 Dementia in other diseases classified elsewhere, moderate, with other behavioral disturbance: Secondary | ICD-10-CM

## 2023-02-26 DIAGNOSIS — R41841 Cognitive communication deficit: Secondary | ICD-10-CM | POA: Diagnosis not present

## 2023-02-26 MED ORDER — LORAZEPAM 0.5 MG PO TABS: 0.5000 mg | ORAL_TABLET | Freq: Two times a day (BID) | ORAL | 0 refills | Status: DC | PRN

## 2023-02-26 MED ORDER — HYDROCODONE-ACETAMINOPHEN 5-325 MG PO TABS: 0.5000 | ORAL_TABLET | Freq: Three times a day (TID) | ORAL | 0 refills | Status: AC | PRN
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
## 2023-02-27 DIAGNOSIS — S99922A Unspecified injury of left foot, initial encounter: Secondary | ICD-10-CM | POA: Diagnosis not present

## 2023-02-27 DIAGNOSIS — S99912A Unspecified injury of left ankle, initial encounter: Secondary | ICD-10-CM | POA: Diagnosis not present

## 2023-02-27 DIAGNOSIS — S99921A Unspecified injury of right foot, initial encounter: Secondary | ICD-10-CM | POA: Diagnosis not present

## 2023-02-27 DIAGNOSIS — S99911A Unspecified injury of right ankle, initial encounter: Secondary | ICD-10-CM | POA: Diagnosis not present

## 2023-02-28 ENCOUNTER — Encounter: Payer: Self-pay | Admitting: Orthopedic Surgery

## 2023-02-28 ENCOUNTER — Non-Acute Institutional Stay (SKILLED_NURSING_FACILITY): Payer: Medicare Other | Admitting: Orthopedic Surgery

## 2023-02-28 DIAGNOSIS — R630 Anorexia: Secondary | ICD-10-CM | POA: Diagnosis not present

## 2023-02-28 DIAGNOSIS — F03911 Unspecified dementia, unspecified severity, with agitation: Secondary | ICD-10-CM | POA: Diagnosis not present

## 2023-02-28 DIAGNOSIS — R1311 Dysphagia, oral phase: Secondary | ICD-10-CM | POA: Diagnosis not present

## 2023-02-28 DIAGNOSIS — R296 Repeated falls: Secondary | ICD-10-CM | POA: Diagnosis not present

## 2023-02-28 DIAGNOSIS — S81812A Laceration without foreign body, left lower leg, initial encounter: Secondary | ICD-10-CM | POA: Diagnosis not present

## 2023-02-28 DIAGNOSIS — R41841 Cognitive communication deficit: Secondary | ICD-10-CM | POA: Diagnosis not present
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
## 2023-03-01 ENCOUNTER — Non-Acute Institutional Stay (SKILLED_NURSING_FACILITY): Payer: Medicare Other | Admitting: Internal Medicine

## 2023-03-01 ENCOUNTER — Encounter: Payer: Self-pay | Admitting: Internal Medicine

## 2023-03-01 DIAGNOSIS — F32A Depression, unspecified: Secondary | ICD-10-CM | POA: Diagnosis not present

## 2023-03-01 DIAGNOSIS — F03911 Unspecified dementia, unspecified severity, with agitation: Secondary | ICD-10-CM

## 2023-03-01 DIAGNOSIS — R630 Anorexia: Secondary | ICD-10-CM | POA: Diagnosis not present

## 2023-03-01 DIAGNOSIS — G301 Alzheimer's disease with late onset: Secondary | ICD-10-CM

## 2023-03-01 DIAGNOSIS — F419 Anxiety disorder, unspecified: Secondary | ICD-10-CM | POA: Diagnosis not present

## 2023-03-01 DIAGNOSIS — R634 Abnormal weight loss: Secondary | ICD-10-CM

## 2023-03-01 DIAGNOSIS — S81812A Laceration without foreign body, left lower leg, initial encounter: Secondary | ICD-10-CM

## 2023-03-01 NOTE — Progress Notes (Unsigned)
Location: [facility] Room Number: 30A Place of Service:  SNF [phone])  Provider: [name], MD   Code Status: DNR Goals of Care:     03/01/2023    3:05 PM  Advanced Directives  Does Patient Have a Medical Advance Directive? Yes  Type of Paramedic of [place];Living will;Out of facility DNR (pink MOST or yellow form)  Does patient want to make changes to medical advance directive? No - Patient declined  Copy of [place] in Chart? Yes - validated most recent copy scanned in chart (See row information)     Chief Complaint  Patient presents with   Acute Visit    Patient being seen for Lethargy    HPI: Patient is a 89 y.o. female seen today for an acute visit for Lethargy and Weight loss  Lives in SNF Had Covid on 02/08 Since then having poor appetite behavior issues Has lost weight almost 10 lbs Per nurses eating 25 % of her meals Sister in the room today Patient was very lethargic Slurring words  She was up all night Nurses had to give her Ativan Very restless. No fever or chills  Patient has also  h/o  has h/o CAD s/p Angioplasty and Stenting in RCA HTN, HLD Moderate Aortic stenosis,  H/o PAF Underwent DCCV No Anticoagulation due to her h/o Falls and Acute Anemia Chronic Back pain Unstable Gait and Falls Sustained Compression Fractures of T 6 and T 10 L3 Fracture Anemia Refused GI work up Dementia   Past Medical History:  Diagnosis Date   Anxiety and depression    Aortic stenosis    a. 03/2014: Mild aortic stenosis by valve area and mean gradient though appeared more visually consistent with moderate AS.    CAD (coronary artery disease)    a. s/p prior PCI of D2 in 1992 b. stent to RCA in 2005   Diverticula of colon    GERD (gastroesophageal reflux disease)    HTN (hypertension)    Hypercholesteremia    Lateral myocardial infarction ([place]) 1992   Osteoarthritis    Osteopenia    Scoliosis      Past Surgical History:  Procedure Laterality Date   ABDOMINAL HYSTERECTOMY     APPENDECTOMY     CORONARY ANGIOPLASTY     TONSILLECTOMY      Allergies  Allergen Reactions   Latex Other (See Comments)    Unknown reaction - listed on [facility] 02/10/22   Nifedipine Other (See Comments)    Dark Urine; can only take orange tablet, allergic to yellow-brown tablet   Tetanus Toxoids Other (See Comments)    Unknown reaction   Tetanus-Diphtheria Toxoids Td Other (See Comments)    Unknown reaction   Adhesive [Tape] Rash    Outpatient Encounter Medications as of 03/01/2023  Medication Sig   Acetaminophen (TYLENOL PO) Take 1,000 mg by mouth daily.   acetaminophen (TYLENOL) 500 MG tablet Take 1,000 mg by mouth 2 (two) times daily.   atorvastatin (LIPITOR) 10 MG tablet Take 10 mg by mouth every evening.   cetirizine (ZYRTEC) 10 MG tablet Take 10 mg by mouth daily.   cholecalciferol (VITAMIN D) 25 MCG (1000 UNIT) tablet Take 2,000 Units by mouth every evening.   divalproex (DEPAKOTE SPRINKLE) 125 MG capsule Take 250 mg by mouth 2 (two) times daily.   ferrous sulfate 325 (65 FE) MG tablet Take 325 mg by mouth daily with breakfast.   FOLIC ACID PO  Take 1,000 mcg by mouth daily.   HYDROcodone-acetaminophen (NORCO/VICODIN) 5-325 MG tablet Take 0.5 tablets by mouth 3 (three) times daily as needed for up to 10 days for moderate pain.   loperamide (IMODIUM A-D) 2 MG tablet Take 4 mg by mouth as needed for diarrhea or loose stools.   LORazepam (ATIVAN) 0.5 MG tablet Take 1 tablet (0.5 mg total) by mouth daily. For anxiety and agitation   LORazepam (ATIVAN) 0.5 MG tablet Take 1 tablet (0.5 mg total) by mouth every 12 (twelve) hours as needed.   melatonin 3 MG TABS tablet Take 3 mg by mouth at bedtime.   metoprolol succinate (TOPROL-XL) 50 MG 24 hr tablet Take 50 mg by mouth every evening. Take with or immediately following a meal.   nitroGLYCERIN (NITROSTAT) 0.4 MG SL tablet Place 0.4 mg under the tongue  every 5 (five) minutes as needed for chest pain. As needed fo chest pain   OLANZapine (ZYPREXA) 2.5 MG tablet Take 2.5 mg by mouth daily in the afternoon.   pantoprazole (PROTONIX) 40 MG tablet Take 40 mg by mouth daily.   Polyethylene Glycol 3350 (PEG 3350) 17 GM/SCOOP POWD Give 17 gram by mouth one time a day every 2 day(s) for constipation   senna (SENOKOT) 8.6 MG TABS tablet Take 1 tablet by mouth in the morning.   sertraline (ZOLOFT) 100 MG tablet Take 100 mg by mouth daily.   No facility-administered encounter medications on file as of 03/01/2023.    Review of Systems:  Review of Systems  Unable to perform ROS: Dementia    Health Maintenance  Topic Date Due   Medicare Annual Wellness (AWV)  03/21/2023   COVID-19 Vaccine (9 - 2023-24 season) 03/16/2023 (Originally 01/15/2023)   DTaP/Tdap/Td (5 - Td or Tdap) 03/20/2032   Pneumonia Vaccine 83+ Years old  Completed   INFLUENZA VACCINE  Completed   DEXA SCAN  Completed   Zoster Vaccines- Shingrix  Completed   HPV VACCINES  Aged Out    Physical Exam: Vitals:   03/01/23 1503  BP: (!) 156/88  Pulse: 90  Resp: 18  Temp: (!) 97.3 F (36.3 C)  TempSrc: Temporal  SpO2: 91%  Weight: 113 lb 12.8 oz (51.6 kg)  Height: '5\' 4"'$  (1.626 m)   Body mass index is 19.53 kg/m. Physical Exam Vitals reviewed.  Constitutional:      Comments: somnolent  HENT:     Head: Normocephalic.     Nose: Nose normal.     Mouth/Throat:     Mouth: Mucous membranes are moist.     Pharynx: Oropharynx is clear.  Eyes:     Pupils: Pupils are equal, round, and reactive to light.  Cardiovascular:     Rate and Rhythm: Normal rate. Rhythm irregular.     Pulses: Normal pulses.     Heart sounds: Murmur heard.  Pulmonary:     Effort: Pulmonary effort is normal.     Breath sounds: Normal breath sounds.  Abdominal:     General: Abdomen is flat. Bowel sounds are normal.     Palpations: Abdomen is soft.  Musculoskeletal:        General: No swelling.      Cervical back: Neck supple.  Skin:    General: Skin is warm.  Neurological:     General: No focal deficit present.  Psychiatric:        Mood and Affect: Mood normal.        Thought Content: Thought content normal.  Labs reviewed: Basic Metabolic Panel: Recent Labs    09/08/22 0422 09/11/22 0730 11/02/22 0000 02/01/23 0000  NA 134* 135 141 141  K 3.2* 3.4* 3.8 3.9  CL 101 96* 104 106  CO2 25 29 31* 25*  GLUCOSE 106* 67  --   --   BUN '10 16 11 '$ 22*  CREATININE 0.38* 0.62 0.4* 0.5  CALCIUM 8.9 9.0 8.7 8.7   Liver Function Tests: Recent Labs    09/11/22 0730 11/02/22 0000  AST 18 16  ALT 11 10  ALKPHOS  --  128*  BILITOT 0.6  --   PROT 6.6  --   ALBUMIN  --  3.6   No results for input(s): "LIPASE", "AMYLASE" in the last 8760 hours. No results for input(s): "AMMONIA" in the last 8760 hours. CBC: Recent Labs    03/09/22 0000 06/05/22 0000 09/08/22 0422 09/11/22 0730 11/02/22 0000 12/21/22 0840 02/01/23 0000  WBC 3.7   < > 6.7 5.5 4.3 5.5 10.3  NEUTROABS 1,550.00  --  5.0 3,801  --   --   --   HGB 10.7*   < > 14.1 14.5 13.4 14.1 13.1  HCT 36   < > 43.2 42.1 41 42 39  MCV  --   --  95.6 89.6  --   --   --   PLT 266   < > 220 236 209 260 207   < > = values in this interval not displayed.   Lipid Panel: Recent Labs    12/21/22 0840  CHOL 165  HDL 47  LDLCALC 88  TRIG 206*   No results found for: "HGBA1C"  Procedures since last visit: No results found.  Assessment/Plan 1. Agitation due to dementia (HCC) Change Zyprexa to 5 mg BID as it has helped before Change Depakote back to 125 mg BID Also start on Ativan  2. Poor appetite Remeron 7.5 mg to start on Mon  3. Weight loss Hospice referal  4. Skin tear of lower leg without complication, left, initial encounter ***  5. Moderate late onset Alzheimer's dementia with other behavioral disturbance (HCC) ***  6. Anxiety and depression ***    Labs/tests ordered:  * No order type specified  * Next appt:  Visit date not found

## 2023-03-03 DIAGNOSIS — F0283 Dementia in other diseases classified elsewhere, unspecified severity, with mood disturbance: Secondary | ICD-10-CM | POA: Diagnosis not present

## 2023-03-03 DIAGNOSIS — J302 Other seasonal allergic rhinitis: Secondary | ICD-10-CM | POA: Diagnosis not present

## 2023-03-03 DIAGNOSIS — S81819D Laceration without foreign body, unspecified lower leg, subsequent encounter: Secondary | ICD-10-CM | POA: Diagnosis not present

## 2023-03-03 DIAGNOSIS — M810 Age-related osteoporosis without current pathological fracture: Secondary | ICD-10-CM | POA: Diagnosis not present

## 2023-03-03 DIAGNOSIS — E785 Hyperlipidemia, unspecified: Secondary | ICD-10-CM | POA: Diagnosis not present

## 2023-03-03 DIAGNOSIS — G309 Alzheimer's disease, unspecified: Secondary | ICD-10-CM | POA: Diagnosis not present

## 2023-03-03 DIAGNOSIS — R296 Repeated falls: Secondary | ICD-10-CM | POA: Diagnosis not present

## 2023-03-03 DIAGNOSIS — F0284 Dementia in other diseases classified elsewhere, unspecified severity, with anxiety: Secondary | ICD-10-CM | POA: Diagnosis not present

## 2023-03-03 DIAGNOSIS — R32 Unspecified urinary incontinence: Secondary | ICD-10-CM | POA: Diagnosis not present

## 2023-03-03 DIAGNOSIS — R159 Full incontinence of feces: Secondary | ICD-10-CM | POA: Diagnosis not present

## 2023-03-03 DIAGNOSIS — Z8616 Personal history of COVID-19: Secondary | ICD-10-CM | POA: Diagnosis not present

## 2023-03-03 DIAGNOSIS — I10 Essential (primary) hypertension: Secondary | ICD-10-CM | POA: Diagnosis not present

## 2023-03-03 DIAGNOSIS — I2510 Atherosclerotic heart disease of native coronary artery without angina pectoris: Secondary | ICD-10-CM | POA: Diagnosis not present

## 2023-03-03 DIAGNOSIS — K219 Gastro-esophageal reflux disease without esophagitis: Secondary | ICD-10-CM | POA: Diagnosis not present

## 2023-03-03 DIAGNOSIS — M4800 Spinal stenosis, site unspecified: Secondary | ICD-10-CM | POA: Diagnosis not present

## 2023-03-03 DIAGNOSIS — D649 Anemia, unspecified: Secondary | ICD-10-CM | POA: Diagnosis not present

## 2023-03-03 DIAGNOSIS — G2581 Restless legs syndrome: Secondary | ICD-10-CM | POA: Diagnosis not present

## 2023-03-03 DIAGNOSIS — I4891 Unspecified atrial fibrillation: Secondary | ICD-10-CM | POA: Diagnosis not present

## 2023-03-03 DIAGNOSIS — I700 Atherosclerosis of aorta: Secondary | ICD-10-CM | POA: Diagnosis not present

## 2023-03-04 DIAGNOSIS — S81819D Laceration without foreign body, unspecified lower leg, subsequent encounter: Secondary | ICD-10-CM | POA: Diagnosis not present

## 2023-03-04 DIAGNOSIS — R296 Repeated falls: Secondary | ICD-10-CM | POA: Diagnosis not present

## 2023-03-04 DIAGNOSIS — F0284 Dementia in other diseases classified elsewhere, unspecified severity, with anxiety: Secondary | ICD-10-CM | POA: Diagnosis not present

## 2023-03-04 DIAGNOSIS — F0283 Dementia in other diseases classified elsewhere, unspecified severity, with mood disturbance: Secondary | ICD-10-CM | POA: Diagnosis not present

## 2023-03-04 DIAGNOSIS — G309 Alzheimer's disease, unspecified: Secondary | ICD-10-CM | POA: Diagnosis not present

## 2023-03-04 DIAGNOSIS — Z8616 Personal history of COVID-19: Secondary | ICD-10-CM | POA: Diagnosis not present

## 2023-03-05 ENCOUNTER — Telehealth: Payer: Self-pay

## 2023-03-05 DIAGNOSIS — Z8616 Personal history of COVID-19: Secondary | ICD-10-CM | POA: Diagnosis not present

## 2023-03-05 DIAGNOSIS — R296 Repeated falls: Secondary | ICD-10-CM | POA: Diagnosis not present

## 2023-03-05 DIAGNOSIS — F0284 Dementia in other diseases classified elsewhere, unspecified severity, with anxiety: Secondary | ICD-10-CM | POA: Diagnosis not present

## 2023-03-05 DIAGNOSIS — G309 Alzheimer's disease, unspecified: Secondary | ICD-10-CM | POA: Diagnosis not present

## 2023-03-05 DIAGNOSIS — F0283 Dementia in other diseases classified elsewhere, unspecified severity, with mood disturbance: Secondary | ICD-10-CM | POA: Diagnosis not present

## 2023-03-05 DIAGNOSIS — S81819D Laceration without foreign body, unspecified lower leg, subsequent encounter: Secondary | ICD-10-CM | POA: Diagnosis not present

## 2023-03-05 NOTE — Telephone Encounter (Signed)
Refill request received from pharmacy for Morphine solution 100/52m Take 0.25 mls ('5mg'$ ) every 4 hours as needed for pain/dyspnea.  Message sent to AWindell Moulding NP

## 2023-03-06 DIAGNOSIS — R296 Repeated falls: Secondary | ICD-10-CM | POA: Diagnosis not present

## 2023-03-06 DIAGNOSIS — F0283 Dementia in other diseases classified elsewhere, unspecified severity, with mood disturbance: Secondary | ICD-10-CM | POA: Diagnosis not present

## 2023-03-06 DIAGNOSIS — G309 Alzheimer's disease, unspecified: Secondary | ICD-10-CM | POA: Diagnosis not present

## 2023-03-06 DIAGNOSIS — S81819D Laceration without foreign body, unspecified lower leg, subsequent encounter: Secondary | ICD-10-CM | POA: Diagnosis not present

## 2023-03-06 DIAGNOSIS — F0284 Dementia in other diseases classified elsewhere, unspecified severity, with anxiety: Secondary | ICD-10-CM | POA: Diagnosis not present

## 2023-03-06 DIAGNOSIS — Z8616 Personal history of COVID-19: Secondary | ICD-10-CM | POA: Diagnosis not present

## 2023-03-07 DIAGNOSIS — F0284 Dementia in other diseases classified elsewhere, unspecified severity, with anxiety: Secondary | ICD-10-CM | POA: Diagnosis not present

## 2023-03-07 DIAGNOSIS — R296 Repeated falls: Secondary | ICD-10-CM | POA: Diagnosis not present

## 2023-03-07 DIAGNOSIS — G309 Alzheimer's disease, unspecified: Secondary | ICD-10-CM | POA: Diagnosis not present

## 2023-03-07 DIAGNOSIS — F0283 Dementia in other diseases classified elsewhere, unspecified severity, with mood disturbance: Secondary | ICD-10-CM | POA: Diagnosis not present

## 2023-03-07 DIAGNOSIS — S81819D Laceration without foreign body, unspecified lower leg, subsequent encounter: Secondary | ICD-10-CM | POA: Diagnosis not present

## 2023-03-07 DIAGNOSIS — Z8616 Personal history of COVID-19: Secondary | ICD-10-CM | POA: Diagnosis not present

## 2023-03-08 DIAGNOSIS — R296 Repeated falls: Secondary | ICD-10-CM | POA: Diagnosis not present

## 2023-03-08 DIAGNOSIS — S81819D Laceration without foreign body, unspecified lower leg, subsequent encounter: Secondary | ICD-10-CM | POA: Diagnosis not present

## 2023-03-08 DIAGNOSIS — G309 Alzheimer's disease, unspecified: Secondary | ICD-10-CM | POA: Diagnosis not present

## 2023-03-08 DIAGNOSIS — F0283 Dementia in other diseases classified elsewhere, unspecified severity, with mood disturbance: Secondary | ICD-10-CM | POA: Diagnosis not present

## 2023-03-08 DIAGNOSIS — F0284 Dementia in other diseases classified elsewhere, unspecified severity, with anxiety: Secondary | ICD-10-CM | POA: Diagnosis not present

## 2023-03-08 DIAGNOSIS — Z8616 Personal history of COVID-19: Secondary | ICD-10-CM | POA: Diagnosis not present

## 2023-03-09 DIAGNOSIS — R296 Repeated falls: Secondary | ICD-10-CM | POA: Diagnosis not present

## 2023-03-09 DIAGNOSIS — G309 Alzheimer's disease, unspecified: Secondary | ICD-10-CM | POA: Diagnosis not present

## 2023-03-09 DIAGNOSIS — F0284 Dementia in other diseases classified elsewhere, unspecified severity, with anxiety: Secondary | ICD-10-CM | POA: Diagnosis not present

## 2023-03-09 DIAGNOSIS — F0283 Dementia in other diseases classified elsewhere, unspecified severity, with mood disturbance: Secondary | ICD-10-CM | POA: Diagnosis not present

## 2023-03-09 DIAGNOSIS — Z8616 Personal history of COVID-19: Secondary | ICD-10-CM | POA: Diagnosis not present

## 2023-03-09 DIAGNOSIS — S81819D Laceration without foreign body, unspecified lower leg, subsequent encounter: Secondary | ICD-10-CM | POA: Diagnosis not present

## 2023-03-10 DIAGNOSIS — G309 Alzheimer's disease, unspecified: Secondary | ICD-10-CM | POA: Diagnosis not present

## 2023-03-10 DIAGNOSIS — R296 Repeated falls: Secondary | ICD-10-CM | POA: Diagnosis not present

## 2023-03-10 DIAGNOSIS — F0283 Dementia in other diseases classified elsewhere, unspecified severity, with mood disturbance: Secondary | ICD-10-CM | POA: Diagnosis not present

## 2023-03-10 DIAGNOSIS — S81819D Laceration without foreign body, unspecified lower leg, subsequent encounter: Secondary | ICD-10-CM | POA: Diagnosis not present

## 2023-03-10 DIAGNOSIS — Z8616 Personal history of COVID-19: Secondary | ICD-10-CM | POA: Diagnosis not present

## 2023-03-10 DIAGNOSIS — F0284 Dementia in other diseases classified elsewhere, unspecified severity, with anxiety: Secondary | ICD-10-CM | POA: Diagnosis not present

## 2023-03-12 ENCOUNTER — Telehealth: Payer: Self-pay

## 2023-03-12 NOTE — Telephone Encounter (Signed)
Medication refill request received from [facility] for Morphine 100/39ml Take 0.25 mls by mouth every hour as needed for pain or shortness of breath for end of life comfort. I do not see medication on active medication list.  Message routed to Dr. Veleta Miners

## 2023-03-12 NOTE — Telephone Encounter (Signed)
Patient  is Deceased

## 2023-03-26 NOTE — Telephone Encounter (Signed)
[facility] Nurse called to notify provider that patient deceased.states patient was on Hospice.Orders given to release the body to funeral home desired by POA.

## 2023-03-26 DEATH — deceased
